# Patient Record
Sex: Male | Born: 1940 | Race: Black or African American | Hispanic: No | Marital: Married | Smoking: Former smoker
Health system: Southern US, Community
[De-identification: ages and names within clinical notes are randomized; demographics above are authoritative.]

## PROBLEM LIST (undated history)

## (undated) DIAGNOSIS — R413 Other amnesia: Secondary | ICD-10-CM

## (undated) DIAGNOSIS — J302 Other seasonal allergic rhinitis: Secondary | ICD-10-CM

## (undated) DIAGNOSIS — K219 Gastro-esophageal reflux disease without esophagitis: Secondary | ICD-10-CM

## (undated) DIAGNOSIS — M1990 Unspecified osteoarthritis, unspecified site: Secondary | ICD-10-CM

## (undated) DIAGNOSIS — F32A Depression, unspecified: Secondary | ICD-10-CM

## (undated) DIAGNOSIS — Z9289 Personal history of other medical treatment: Secondary | ICD-10-CM

## (undated) DIAGNOSIS — K631 Perforation of intestine (nontraumatic): Secondary | ICD-10-CM

## (undated) DIAGNOSIS — Z87442 Personal history of urinary calculi: Secondary | ICD-10-CM

## (undated) DIAGNOSIS — I639 Cerebral infarction, unspecified: Secondary | ICD-10-CM

## (undated) DIAGNOSIS — E049 Nontoxic goiter, unspecified: Secondary | ICD-10-CM

## (undated) DIAGNOSIS — F419 Anxiety disorder, unspecified: Secondary | ICD-10-CM

## (undated) DIAGNOSIS — E7800 Pure hypercholesterolemia, unspecified: Secondary | ICD-10-CM

## (undated) DIAGNOSIS — F329 Major depressive disorder, single episode, unspecified: Secondary | ICD-10-CM

## (undated) HISTORY — PX: OTHER SURGICAL HISTORY: SHX169

## (undated) HISTORY — PX: CHOLECYSTECTOMY: SHX55

## (undated) HISTORY — PX: CERVICAL DISCECTOMY: SHX98

---

## 1998-05-16 ENCOUNTER — Emergency Department (HOSPITAL_COMMUNITY): Admission: EM | Admit: 1998-05-16 | Discharge: 1998-05-16 | Payer: Self-pay | Admitting: Emergency Medicine

## 1998-05-27 ENCOUNTER — Emergency Department (HOSPITAL_COMMUNITY): Admission: EM | Admit: 1998-05-27 | Discharge: 1998-05-27 | Payer: Self-pay | Admitting: Endocrinology

## 1998-06-02 ENCOUNTER — Encounter: Admission: RE | Admit: 1998-06-02 | Discharge: 1998-08-31 | Payer: Self-pay | Admitting: Anesthesiology

## 1998-06-17 ENCOUNTER — Emergency Department (HOSPITAL_COMMUNITY): Admission: EM | Admit: 1998-06-17 | Discharge: 1998-06-17 | Payer: Self-pay | Admitting: Emergency Medicine

## 1998-06-17 ENCOUNTER — Ambulatory Visit (HOSPITAL_COMMUNITY): Admission: RE | Admit: 1998-06-17 | Discharge: 1998-06-17 | Payer: Self-pay | Admitting: Neurosurgery

## 1998-07-01 ENCOUNTER — Encounter: Payer: Self-pay | Admitting: Neurosurgery

## 1998-07-05 ENCOUNTER — Inpatient Hospital Stay (HOSPITAL_COMMUNITY): Admission: RE | Admit: 1998-07-05 | Discharge: 1998-07-06 | Payer: Self-pay | Admitting: Neurosurgery

## 1998-08-02 ENCOUNTER — Encounter: Admission: RE | Admit: 1998-08-02 | Discharge: 1998-10-31 | Payer: Self-pay | Admitting: Neurosurgery

## 1998-09-01 ENCOUNTER — Emergency Department (HOSPITAL_COMMUNITY): Admission: EM | Admit: 1998-09-01 | Discharge: 1998-09-01 | Payer: Self-pay | Admitting: Emergency Medicine

## 1998-09-01 ENCOUNTER — Encounter: Payer: Self-pay | Admitting: Emergency Medicine

## 1998-10-13 ENCOUNTER — Encounter: Payer: Self-pay | Admitting: Chiropractic Medicine

## 1998-10-13 ENCOUNTER — Ambulatory Visit (HOSPITAL_COMMUNITY): Admission: RE | Admit: 1998-10-13 | Discharge: 1998-10-13 | Payer: Self-pay | Admitting: Chiropractic Medicine

## 2000-12-20 ENCOUNTER — Encounter: Payer: Self-pay | Admitting: Emergency Medicine

## 2000-12-20 ENCOUNTER — Emergency Department (HOSPITAL_COMMUNITY): Admission: EM | Admit: 2000-12-20 | Discharge: 2000-12-20 | Payer: Self-pay | Admitting: Emergency Medicine

## 2001-09-05 ENCOUNTER — Ambulatory Visit (HOSPITAL_COMMUNITY): Admission: RE | Admit: 2001-09-05 | Discharge: 2001-09-05 | Payer: Self-pay | Admitting: Cardiology

## 2001-09-05 ENCOUNTER — Encounter: Payer: Self-pay | Admitting: Cardiology

## 2003-05-04 ENCOUNTER — Emergency Department (HOSPITAL_COMMUNITY): Admission: EM | Admit: 2003-05-04 | Discharge: 2003-05-04 | Payer: Self-pay | Admitting: Emergency Medicine

## 2006-04-26 ENCOUNTER — Emergency Department (HOSPITAL_COMMUNITY): Admission: EM | Admit: 2006-04-26 | Discharge: 2006-04-26 | Payer: Self-pay | Admitting: Emergency Medicine

## 2007-06-23 ENCOUNTER — Ambulatory Visit (HOSPITAL_COMMUNITY): Admission: RE | Admit: 2007-06-23 | Discharge: 2007-06-23 | Payer: Self-pay | Admitting: Cardiology

## 2007-06-25 ENCOUNTER — Emergency Department (HOSPITAL_COMMUNITY): Admission: EM | Admit: 2007-06-25 | Discharge: 2007-06-25 | Payer: Self-pay | Admitting: Emergency Medicine

## 2010-05-09 ENCOUNTER — Encounter (HOSPITAL_COMMUNITY): Admission: RE | Admit: 2010-05-09 | Discharge: 2010-07-19 | Payer: Self-pay | Admitting: Cardiology

## 2010-11-03 ENCOUNTER — Encounter
Admission: RE | Admit: 2010-11-03 | Discharge: 2010-11-03 | Payer: Self-pay | Source: Home / Self Care | Attending: Family Medicine | Admitting: Family Medicine

## 2010-11-07 ENCOUNTER — Encounter
Admission: RE | Admit: 2010-11-07 | Discharge: 2010-11-07 | Payer: Self-pay | Source: Home / Self Care | Attending: Family Medicine | Admitting: Family Medicine

## 2010-11-09 ENCOUNTER — Inpatient Hospital Stay (HOSPITAL_COMMUNITY): Admission: AD | Admit: 2010-11-09 | Discharge: 2010-11-13 | Payer: Self-pay | Source: Home / Self Care

## 2010-11-10 ENCOUNTER — Encounter (INDEPENDENT_AMBULATORY_CARE_PROVIDER_SITE_OTHER): Payer: Self-pay

## 2010-11-21 ENCOUNTER — Encounter
Admission: RE | Admit: 2010-11-21 | Discharge: 2010-11-21 | Payer: Self-pay | Source: Home / Self Care | Attending: Family Medicine | Admitting: Family Medicine

## 2010-11-21 ENCOUNTER — Other Ambulatory Visit
Admission: RE | Admit: 2010-11-21 | Discharge: 2010-11-21 | Payer: Self-pay | Source: Home / Self Care | Admitting: Interventional Radiology

## 2010-12-11 NOTE — Discharge Summary (Signed)
  [name], Randy Merritt              ACCOUNT NO.:  [id number]  MEDICAL RECORD NO.:  [id number]          PATIENT TYPE:  INP  LOCATION:  5528                         FACILITY:  [facility]  PHYSICIAN:  Randy Merritt, M.D.DATE OF BIRTH:  Jun 24, 1941  DATE OF ADMISSION:  11/09/2010 DATE OF DISCHARGE:  11/13/2010                              DISCHARGE SUMMARY   ADMISSION DIAGNOSIS:  Cholecystitis.  DISCHARGE DIAGNOSES: 1. Cholecystitis. 2. Thyroid nodule 5 x 4 x 4 left and 1.9 x 1.7 x 3.2 right. 3. Postoperative urinary retention. 4. Dyslipidemia. 5. Gastroesophageal reflux disease. 6. Erectile dysfunction. 7. History of anxiety and depression.  PROCEDURES: 1. Laparoscopic cholecystectomy. 2. Intraoperative arteriogram on November 10, 2010.  BRIEF HISTORY:  The patient is a 70 year old male admitted with right upper quadrant pain.  It became more frequent over the past several weeks, had no change with eating.  He was seen by Dr. Azucena Kuba  at [facility] on November 07, 2010.  LFTs were normal; however, he was sent for an abdominal ultrasound, which showed multiple gallstones with positive Murphy sign.  There was no sign of pericholecystic fluid.  He is referred to our Urgent Clinic and continues to have significant pain. He was seen on November 09, 2010 and admitted to the hospital at that time.  PAST MEDICAL HISTORY: 1. Dyslipidemia. 2. GERD. 3. Erectile dysfunction. 4. Depression and anxiety.  PAST SURGERIES:  Neck surgery.  ALLERGIES:  PREDNISONE.  For further history and physical, please see the dictated note.  HOSPITAL COURSE:  The patient was admitted, placed on antibiotics, and taken to the operating room on November 10, 2010.  He tolerated the procedure well, returned to the floor in stable condition. Postoperatively, he developed some urinary retention, although they had difficultly, a Foley was placed.  He was placed on Flomax.  He made slow steady  progress, urinary retention, based on bladder scan improved.  His abdomen was soft and tender.  He had good bowel sounds.  His Foley was removed and he was found to be ready for discharge on the afternoon of November 13, 2010.  He was discharged home on: 1. Flomax 0.4 mg daily. 2. He was continued on chlordiazepoxide 25 mg t.i.d. p.r.n. 3. Finasteride 5 mg daily. 4. Flexeril 10 mg at bedtime. 5. Prozac 1 daily. 6. Hydromet syrup 1 teaspoon q.6 h. 7. Naprosyn 500 mg b.i.d. 8. Paxil 120 mg 1 daily. 9. Percocet p.r.n. for pain. 10.Protonix 40 mg daily. 11.Tramadol p.r.n. for pain.  Scheduled to return for followup in 2 weeks.  DISCHARGE ACTIVITY:  Light to moderate as tolerated.  He was to remove his dressings, start showering in 24 hours.  Remove Steri-Strips in about 1 week.  Call for any problems.     Randy Merritt, P.A.   ______________________________ Randy Merritt, M.D.    WDJ/MEDQ  D:  12/01/2010  T:  12/02/2010  Job:  [id number]  cc:   Dr. Azucena Kuba  Electronically Signed by Randy Merritt P.A. on 12/05/2010 01:09:20 PM Electronically Signed by Randy Merritt M.D. on 12/11/2010 02:02:15 PM

## 2011-01-22 LAB — COMPREHENSIVE METABOLIC PANEL
ALT: 28 U/L (ref 0–53)
AST: 31 U/L (ref 0–37)
Albumin: 4.3 g/dL (ref 3.5–5.2)
Alkaline Phosphatase: 83 U/L (ref 39–117)
BUN: 13 mg/dL (ref 6–23)
CO2: 29 mEq/L (ref 19–32)
Calcium: 9.9 mg/dL (ref 8.4–10.5)
Chloride: 103 mEq/L (ref 96–112)
Creatinine, Ser: 1.28 mg/dL (ref 0.4–1.5)
GFR calc Af Amer: >60 mL/min (ref >60)
GFR calc non Af Amer: 56 mL/min — ABNORMAL LOW (ref >60)
Glucose, Bld: 84 mg/dL (ref 70–99)
Potassium: 4.1 mEq/L (ref 3.5–5.1)
Sodium: 140 mEq/L (ref 135–145)
Total Bilirubin: 0.6 mg/dL (ref 0.3–1.2)
Total Protein: 7.2 g/dL (ref 6.0–8.3)

## 2011-01-22 LAB — CBC
HCT: 39.1 % (ref 39.0–52.0)
Hemoglobin: 13.4 g/dL (ref 13.0–17.0)
MCH: 32 pg (ref 26.0–34.0)
MCHC: 34.3 g/dL (ref 30.0–36.0)
MCV: 93.3 fL (ref 78.0–100.0)
Platelets: 173 10*3/uL (ref 150–400)
RBC: 4.19 MIL/uL — ABNORMAL LOW (ref 4.22–5.81)
RDW: 12.8 % (ref 11.5–15.5)
WBC: 3.9 10*3/uL — ABNORMAL LOW (ref 4.0–10.5)

## 2012-03-10 ENCOUNTER — Encounter (HOSPITAL_COMMUNITY): Payer: Self-pay | Admitting: Emergency Medicine

## 2012-03-10 ENCOUNTER — Emergency Department (HOSPITAL_COMMUNITY)
Admission: EM | Admit: 2012-03-10 | Discharge: 2012-03-10 | Disposition: A | Discharge status: Home / Self Care | Payer: 59 | Attending: Emergency Medicine | Admitting: Emergency Medicine

## 2012-03-10 ENCOUNTER — Emergency Department (HOSPITAL_COMMUNITY): Payer: 59

## 2012-03-10 DIAGNOSIS — M19019 Primary osteoarthritis, unspecified shoulder: Secondary | ICD-10-CM | POA: Insufficient documentation

## 2012-03-10 DIAGNOSIS — M25519 Pain in unspecified shoulder: Secondary | ICD-10-CM | POA: Insufficient documentation

## 2012-03-10 DIAGNOSIS — X500XXA Overexertion from strenuous movement or load, initial encounter: Secondary | ICD-10-CM | POA: Insufficient documentation

## 2012-03-10 MED ORDER — KETOROLAC TROMETHAMINE 10 MG PO TABS: 10.0000 mg | ORAL_TABLET | Freq: Four times a day (QID) | ORAL | Status: AC | PRN

## 2012-03-10 MED ORDER — KETOROLAC TROMETHAMINE 30 MG/ML IJ SOLN
30.0000 mg | Freq: Once | INTRAMUSCULAR | Status: AC
  Administered 2012-03-10: 30 mg via INTRAMUSCULAR
  Filled 2012-03-10: qty 1

## 2012-03-10 NOTE — Discharge Instructions (Signed)
X-ray shows that you have degenerative disease in your shoulder, joint.  You've been given a medication called Toradol, which is a potent anti-inflammatory.  It is the same medicine that you received by injection.  Please make an appointment with Dr. Luiz Blare for further evaluation

## 2012-03-10 NOTE — ED Provider Notes (Signed)
History     CSN: [id number]  Arrival date & time 03/10/12  1928   First MD Initiated Contact with Patient 03/10/12 2019      Chief Complaint  Patient presents with  . Shoulder Pain    (Consider location/radiation/quality/duration/timing/severity/associated sxs/prior treatment) HPI Comments: Patient states he was helping a family member move lifting dressers so has exotropia and experienced right shoulder pain.  Last week.  He took one Advil without relief.  He tried a topical pain medication without relief.  Tonight he took one Advil without relief  Patient is a 71 y.o. male presenting with shoulder pain. The history is provided by the patient.  Shoulder Pain The current episode started 1 to 4 weeks ago. The problem occurs constantly. The problem has been unchanged. Associated symptoms include arthralgias. Pertinent negatives include no chest pain, chills, fever, headaches, joint swelling or neck pain.    No past medical history on file.  No past surgical history on file.  No family history on file.  History  Substance Use Topics  . Smoking status: Not on file  . Smokeless tobacco: Not on file  . Alcohol Use: Not on file      Review of Systems  Constitutional: Negative for fever and chills.  HENT: Negative for neck pain and neck stiffness.   Respiratory: Negative for shortness of breath.   Cardiovascular: Negative for chest pain.  Musculoskeletal: Positive for arthralgias. Negative for back pain and joint swelling.  Skin: Negative for wound.  Neurological: Negative for dizziness and headaches.    Allergies  Review of patient's allergies indicates no known allergies.  Home Medications   Current Outpatient Rx  Name Route Sig Dispense Refill  . CHLORDIAZEPOXIDE HCL 25 MG PO CAPS Oral Take 25 mg by mouth as needed. For anxiety.    Marland Kitchen FINASTERIDE 5 MG PO TABS Oral Take 5 mg by mouth daily.    . IBUPROFEN 200 MG PO TABS Oral Take 200-400 mg by mouth every 6 (six)  hours as needed. For pain    . LEVOCETIRIZINE DIHYDROCHLORIDE 5 MG PO TABS Oral Take 5 mg by mouth every evening.    Marland Kitchen NYSTATIN 100000 UNIT/ML MT SUSP Swish & Swallow Swish and swallow 500,000 Units 4 (four) times daily.    Marland Kitchen PANTOPRAZOLE SODIUM 40 MG PO TBEC Oral Take 40 mg by mouth every morning.    Marland Kitchen PAROXETINE HCL 20 MG PO TABS Oral Take 20 mg by mouth every morning.    Marland Kitchen ROSUVASTATIN CALCIUM 20 MG PO TABS Oral Take 10 mg by mouth at bedtime.    Marland Kitchen KETOROLAC TROMETHAMINE 10 MG PO TABS Oral Take 1 tablet (10 mg total) by mouth every 6 (six) hours as needed for pain. 20 tablet 0    BP 146/85  Pulse 64  Temp(Src) 98.1 F (36.7 C) (Oral)  Resp 18  SpO2 100%  Physical Exam  Constitutional: He is oriented to person, place, and time. He appears well-developed and well-nourished.  HENT:  Head: Normocephalic.  Eyes: Pupils are equal, round, and reactive to light.  Neck: Normal range of motion.  Cardiovascular: Normal rate.   Pulmonary/Chest: Effort normal.  Musculoskeletal:       Right shoulder: He exhibits decreased range of motion, tenderness and pain. He exhibits no bony tenderness, no swelling, no effusion, no crepitus, no deformity, no laceration, no spasm, normal pulse and normal strength.       Arms: Neurological: He is alert and oriented to person, place, and time.  Skin: Skin is warm and dry. No rash noted.    ED Course  Procedures (including critical care time)  Labs Reviewed - No data to display Dg Shoulder Right  03/10/2012  *RADIOLOGY REPORT*  Clinical Data: Right shoulder pain  RIGHT SHOULDER - 2+ VIEW  Comparison: None  Findings: Right acromioclavicular joint degenerative change.  No acute fracture or dislocation.   Right upper lung is clear. Multilevel degenerative changes of the upper thoracic spine.  IMPRESSION: Right acromioclavicular degenerative changes.  No acute osseous abnormality identified.  Original Report Authenticated By: Waneta Martins, M.D.     1.  Degenerative joint disease of shoulder region, right       MDM  After exam.  This appears to be muscle strain        Arman Filter, NP 03/10/12 2227

## 2012-03-10 NOTE — ED Notes (Signed)
Pt reports right shoulder pain for 1 week after helping son move; pt reports soreness upon movement. Been taking OTC meds but no relief

## 2012-03-10 NOTE — ED Provider Notes (Signed)
Medical screening examination/treatment/procedure(s) were performed by non-physician practitioner and as supervising physician I was immediately available for consultation/collaboration.  Ethelda Chick, MD 03/10/12 2234

## 2012-12-27 ENCOUNTER — Emergency Department (HOSPITAL_COMMUNITY)
Admission: EM | Admit: 2012-12-27 | Discharge: 2012-12-27 | Disposition: A | Discharge status: Home / Self Care | Payer: 59 | Attending: Emergency Medicine | Admitting: Emergency Medicine

## 2012-12-27 ENCOUNTER — Encounter (HOSPITAL_COMMUNITY): Payer: Self-pay | Admitting: *Deleted

## 2012-12-27 ENCOUNTER — Emergency Department (HOSPITAL_COMMUNITY): Payer: 59

## 2012-12-27 DIAGNOSIS — Y939 Activity, unspecified: Secondary | ICD-10-CM | POA: Insufficient documentation

## 2012-12-27 DIAGNOSIS — S298XXA Other specified injuries of thorax, initial encounter: Secondary | ICD-10-CM | POA: Insufficient documentation

## 2012-12-27 DIAGNOSIS — E7800 Pure hypercholesterolemia, unspecified: Secondary | ICD-10-CM | POA: Insufficient documentation

## 2012-12-27 DIAGNOSIS — IMO0002 Reserved for concepts with insufficient information to code with codable children: Secondary | ICD-10-CM | POA: Insufficient documentation

## 2012-12-27 DIAGNOSIS — Z87891 Personal history of nicotine dependence: Secondary | ICD-10-CM | POA: Insufficient documentation

## 2012-12-27 DIAGNOSIS — K219 Gastro-esophageal reflux disease without esophagitis: Secondary | ICD-10-CM | POA: Insufficient documentation

## 2012-12-27 DIAGNOSIS — Y929 Unspecified place or not applicable: Secondary | ICD-10-CM | POA: Insufficient documentation

## 2012-12-27 DIAGNOSIS — T07XXXA Unspecified multiple injuries, initial encounter: Secondary | ICD-10-CM

## 2012-12-27 DIAGNOSIS — T148XXA Other injury of unspecified body region, initial encounter: Secondary | ICD-10-CM

## 2012-12-27 DIAGNOSIS — R296 Repeated falls: Secondary | ICD-10-CM | POA: Insufficient documentation

## 2012-12-27 DIAGNOSIS — Z79899 Other long term (current) drug therapy: Secondary | ICD-10-CM | POA: Insufficient documentation

## 2012-12-27 HISTORY — DX: Pure hypercholesterolemia, unspecified: E78.00

## 2012-12-27 HISTORY — DX: Gastro-esophageal reflux disease without esophagitis: K21.9

## 2012-12-27 MED ORDER — NAPROXEN 375 MG PO TABS: 375.0000 mg | ORAL_TABLET | Freq: Two times a day (BID) | ORAL | Status: DC | PRN

## 2012-12-27 MED ORDER — DIAZEPAM 5 MG PO TABS: 5.0000 mg | ORAL_TABLET | Freq: Three times a day (TID) | ORAL | Status: DC | PRN

## 2012-12-27 MED ORDER — OXYCODONE-ACETAMINOPHEN 5-325 MG PO TABS: 1.0000 | ORAL_TABLET | ORAL | Status: DC | PRN

## 2012-12-27 MED ORDER — OXYCODONE-ACETAMINOPHEN 5-325 MG PO TABS
1.0000 | ORAL_TABLET | Freq: Once | ORAL | Status: AC
  Administered 2012-12-27: 1 via ORAL
  Filled 2012-12-27: qty 1

## 2012-12-27 NOTE — ED Notes (Signed)
Pt states he fell Wednesday or Thursday, states he hit his R side, complaining of R arm and R rib/side pain.

## 2012-12-31 NOTE — ED Provider Notes (Signed)
History    72yM with R sided CP and arm pain. Onset after a fall Thursday. Mechanical. Lost balance. Persistent pain since. NO SOB. Doesn't think hit head. No HA, neck or back pain. No numbness, tingling or loss of strength.   CSN: [id number]  Arrival date & time 12/27/12  1235   First MD Initiated Contact with Patient 12/27/12 1505      Chief Complaint  Patient presents with  . Fall  . Arm Pain  . Flank Pain    (Consider location/radiation/quality/duration/timing/severity/associated sxs/prior treatment) HPI  Past Medical History  Diagnosis Date  . High cholesterol   . Acid reflux     Past Surgical History  Procedure Laterality Date  . Gallstone removal      History reviewed. No pertinent family history.  History  Substance Use Topics  . Smoking status: Former Games developer  . Smokeless tobacco: Never Used  . Alcohol Use: No      Review of Systems  All systems reviewed and negative, other than as noted in HPI.   Allergies  Review of patient's allergies indicates no known allergies.  Home Medications   Current Outpatient Rx  Name  Route  Sig  Dispense  Refill  . chlordiazePOXIDE (LIBRIUM) 25 MG capsule   Oral   Take 25 mg by mouth as needed. For anxiety.         . diazepam (VALIUM) 5 MG tablet   Oral   Take 1 tablet (5 mg total) by mouth every 8 (eight) hours as needed (muscle spasm).   10 tablet   0   . finasteride (PROSCAR) 5 MG tablet   Oral   Take 5 mg by mouth daily.         Marland Kitchen ibuprofen (ADVIL,MOTRIN) 200 MG tablet   Oral   Take 200-400 mg by mouth every 6 (six) hours as needed. For pain         . levocetirizine (XYZAL) 5 MG tablet   Oral   Take 5 mg by mouth every evening.         . naproxen (NAPROSYN) 375 MG tablet   Oral   Take 1 tablet (375 mg total) by mouth 2 (two) times daily as needed.   20 tablet   0   . nystatin (MYCOSTATIN) 100000 UNIT/ML suspension   Swish & Swallow   Swish and swallow 500,000 Units 4 (four) times  daily.         Marland Kitchen oxyCODONE-acetaminophen (PERCOCET/ROXICET) 5-325 MG per tablet   Oral   Take 1 tablet by mouth every 4 (four) hours as needed for pain.   10 tablet   0   . pantoprazole (PROTONIX) 40 MG tablet   Oral   Take 40 mg by mouth every morning.         Marland Kitchen PARoxetine (PAXIL) 20 MG tablet   Oral   Take 20 mg by mouth every morning.         . rosuvastatin (CRESTOR) 20 MG tablet   Oral   Take 10 mg by mouth at bedtime.           BP 148/88  Pulse 72  Temp(Src) 97.5 F (36.4 C) (Oral)  Resp 18  SpO2 97%  Physical Exam  Nursing note and vitals reviewed. Constitutional: He appears well-developed and well-nourished. No distress.  HENT:  Head: Normocephalic and atraumatic.  Eyes: Conjunctivae are normal. Right eye exhibits no discharge. Left eye exhibits no discharge.  Neck: Neck supple.  Cardiovascular: Normal  rate, regular rhythm and normal heart sounds.  Exam reveals no gallop and no friction rub.   No murmur heard. Pulmonary/Chest: Effort normal and breath sounds normal. No respiratory distress. He exhibits tenderness.  Tenderness R chest wall in axillary region. No overlying skin changes. No crepitus.  Abdominal: Soft. He exhibits no distension. There is no tenderness.  Musculoskeletal: He exhibits no edema and no tenderness.  Moderate pain with ROM of R shoulder. NVI distally.   Neurological: He is alert.  Skin: Skin is warm and dry.  Psychiatric: He has a normal mood and affect. His behavior is normal. Thought content normal.    ED Course  Procedures (including critical care time)  Labs Reviewed - No data to display No results found.   1. Muscle strain   2. Multiple contusions       MDM  72yM with R sided chest/shoulder pain after fall. Fairly unimpressive physical exam. Imaging reassuring. Plan symptomatic tx.         Raeford Razor, MD 12/31/12 1019

## 2013-01-26 ENCOUNTER — Emergency Department (HOSPITAL_COMMUNITY): Payer: 59

## 2013-01-26 ENCOUNTER — Encounter (HOSPITAL_COMMUNITY): Payer: Self-pay | Admitting: *Deleted

## 2013-01-26 ENCOUNTER — Emergency Department (HOSPITAL_COMMUNITY)
Admission: EM | Admit: 2013-01-26 | Discharge: 2013-01-26 | Disposition: A | Discharge status: Home Health | Payer: 59 | Attending: Emergency Medicine | Admitting: Emergency Medicine

## 2013-01-26 DIAGNOSIS — M25512 Pain in left shoulder: Secondary | ICD-10-CM

## 2013-01-26 DIAGNOSIS — Z87891 Personal history of nicotine dependence: Secondary | ICD-10-CM | POA: Insufficient documentation

## 2013-01-26 DIAGNOSIS — Z79899 Other long term (current) drug therapy: Secondary | ICD-10-CM | POA: Insufficient documentation

## 2013-01-26 DIAGNOSIS — M25519 Pain in unspecified shoulder: Secondary | ICD-10-CM | POA: Insufficient documentation

## 2013-01-26 DIAGNOSIS — E7800 Pure hypercholesterolemia, unspecified: Secondary | ICD-10-CM | POA: Insufficient documentation

## 2013-01-26 DIAGNOSIS — K219 Gastro-esophageal reflux disease without esophagitis: Secondary | ICD-10-CM | POA: Insufficient documentation

## 2013-01-26 MED ORDER — DIAZEPAM 5 MG PO TABS: 5.0000 mg | ORAL_TABLET | Freq: Three times a day (TID) | ORAL | Status: DC | PRN

## 2013-01-26 MED ORDER — HYDROCODONE-ACETAMINOPHEN 5-325 MG PO TABS: 1.0000 | ORAL_TABLET | Freq: Four times a day (QID) | ORAL | Status: DC | PRN

## 2013-01-26 MED ORDER — OXYCODONE-ACETAMINOPHEN 5-325 MG PO TABS
1.0000 | ORAL_TABLET | Freq: Once | ORAL | Status: AC
  Administered 2013-01-26: 1 via ORAL
  Filled 2013-01-26: qty 1

## 2013-01-26 NOTE — ED Provider Notes (Signed)
History     CSN: [id number]  Arrival date & time 01/26/13  4098   First MD Initiated Contact with Patient 01/26/13 0815      Chief Complaint  Patient presents with  . Arm Pain    left    (Consider location/radiation/quality/duration/timing/severity/associated sxs/prior treatment) HPI Randy Merritt is a 72 y.o. male who presents to ED with complaint of pain to the left shoulder and left arm. Pt states he has had two falls in the last two weeks. States fell two weeks ago after slipping on ice, injuring right shoulder at that time, states was seen in ED, since then that arm feels better. Not sure if injured left as well. States second time fell a week ago, after getting up in the dark and tripping over something. States not sure if injured arm at that time, but has had persistent and worsening pain in left shoulder radiating down to the left hand. Denies numbness of weakness in the hand. States hand feels "swollen" however, there is not visual swelling. Pt states pain worsened with movement of the left shoulder. Unable to left arm on his own. Denies neck pain or injuries, but states hx of surgery. Denies head injury. Taking advil with no relief.    Past Medical History  Diagnosis Date  . High cholesterol   . Acid reflux     Past Surgical History  Procedure Laterality Date  . Gallstone removal      No family history on file.  History  Substance Use Topics  . Smoking status: Former Games developer  . Smokeless tobacco: Never Used  . Alcohol Use: No      Review of Systems  HENT: Negative for neck pain and neck stiffness.   Respiratory: Negative for shortness of breath.   Cardiovascular: Negative for chest pain.  Musculoskeletal: Positive for myalgias and arthralgias. Negative for back pain.  Neurological: Negative for weakness, numbness and headaches.    Allergies  Review of patient's allergies indicates no known allergies.  Home Medications   Current Outpatient Rx  Name   Route  Sig  Dispense  Refill  . chlordiazePOXIDE (LIBRIUM) 25 MG capsule   Oral   Take 25 mg by mouth as needed. For anxiety.         . diazepam (VALIUM) 5 MG tablet   Oral   Take 1 tablet (5 mg total) by mouth every 8 (eight) hours as needed (muscle spasm).   10 tablet   0   . finasteride (PROSCAR) 5 MG tablet   Oral   Take 5 mg by mouth daily.         Marland Kitchen levocetirizine (XYZAL) 5 MG tablet   Oral   Take 5 mg by mouth every evening.         . Multiple Vitamin (MULTIVITAMIN WITH MINERALS) TABS   Oral   Take 1 tablet by mouth daily.         . pantoprazole (PROTONIX) 40 MG tablet   Oral   Take 40 mg by mouth every morning.         Marland Kitchen PARoxetine (PAXIL) 20 MG tablet   Oral   Take 20 mg by mouth every morning.         . rosuvastatin (CRESTOR) 20 MG tablet   Oral   Take 10 mg by mouth at bedtime.           BP 147/79  Temp(Src) 97.6 F (36.4 C) (Oral)  Resp 18  SpO2 99%  Physical Exam  Nursing note and vitals reviewed. Constitutional: He appears well-developed and well-nourished. No distress.  Neck: Normal range of motion.  No midline cervical spine tenderness. Full rom of the neck  Cardiovascular: Normal rate, regular rhythm and normal heart sounds.   Pulmonary/Chest: Effort normal and breath sounds normal. No respiratory distress. He has no wheezes. He has no rales.  Musculoskeletal:  Diffuse tenderness over paravertebral cervical spine and left trapezius and shoulder joint. Full rom passively with minimal pain of the left shoulder. Pain with active ROM greater than 45%. Positive arm drop test. Bicep and triceps strength intact. Grip strength 5/5 and equal bilaterally. Normal distal radial pulses  Neurological:  5/5 and equal bilateral grip strength. Normal sensation over all dermatomes of left hand and arm  Skin: Skin is warm and dry.    ED Course  Procedures (including critical care time)   Dg Cervical Spine Complete  01/26/2013  *RADIOLOGY  REPORT*  Clinical Data: Left neck pain, possible fall  CERVICAL SPINE - COMPLETE 4+ VIEW  Comparison: 06/25/2007  Findings: Cervical spine is visualized to C7-T1 on the lateral view.  Straightening of the cervical spine.  Stable osseous fusion at C3- 4.  No evidence of fracture or dislocation.  Stable mild loss of height of the C5-7 vertebral bodies. The dens appears intact.  The lateral masses of C1 are symmetric.  No prevertebral soft tissue swelling.  Moderate to severe multilevel degenerative changes.  Visualized lung apices are clear.  IMPRESSION: No evidence of fracture or dislocation.  Stable osseous fusion at C3-4.  Moderate to severe multilevel degenerative changes.   Original Report Authenticated By: Charline Bills, M.D.    Dg Shoulder Left  01/26/2013  *RADIOLOGY REPORT*  Clinical Data: Injury, pain.  LEFT SHOULDER - 2+ VIEW  Comparison: None.  Findings: There is no acute bony or joint abnormality.  Bulky acromioclavicular degenerative change is identified.  Imaged left lung and ribs are unremarkable.  IMPRESSION: No acute finding.  Bulky acromioclavicular osteoarthritis.   Original Report Authenticated By: Holley Dexter, M.D.       1. Left shoulder pain       MDM  Pt with left shoulder and arm pain. Reproducible with active movement of the shoulder joint. Neurovascularly intact, normal distal pulses, normal sensation and grip strenght. X-rays showing arthritic changes. Suspect possible rotator cuff injury. Will try  Sling, pain medications, follow up with orthopedics.           Lottie Mussel, PA-C 01/26/13 1605

## 2013-01-26 NOTE — ED Notes (Signed)
Patient states he has fallen over the past couple of weeks due to weather related things and fallen on his left shoulder and arm, patient with spinal surgery approx 15 years ago and now states arm feels as it did prior to that surgery

## 2013-01-26 NOTE — Progress Notes (Signed)
Orthopedic Tech Progress Note Patient Details:  Lissandro Dilorenzo Oct 24, 1941 [id number]  Ortho Devices Type of Ortho Device: Arm sling Ortho Device/Splint Interventions: Application   Cammer, Mickie Bail 01/26/2013, 9:54 AM

## 2013-01-27 NOTE — ED Provider Notes (Signed)
Medical screening examination/treatment/procedure(s) were conducted as a shared visit with non-physician practitioner(s) and myself.  I personally evaluated the patient during the encounter  Elderly patient with mechanical fall, xrays neg. Ortho followup.   Shanta Hartner B. Bernette Mayers, MD 01/27/13 [phone]

## 2013-03-20 ENCOUNTER — Other Ambulatory Visit: Payer: Self-pay | Admitting: Neurosurgery

## 2013-03-20 DIAGNOSIS — M542 Cervicalgia: Secondary | ICD-10-CM

## 2013-03-20 DIAGNOSIS — M5410 Radiculopathy, site unspecified: Secondary | ICD-10-CM

## 2013-03-26 ENCOUNTER — Ambulatory Visit
Admission: RE | Admit: 2013-03-26 | Discharge: 2013-03-26 | Disposition: A | Discharge status: Home / Self Care | Payer: 59 | Source: Ambulatory Visit | Attending: Neurosurgery | Admitting: Neurosurgery

## 2013-03-26 VITALS — BP 147/90 | HR 62 | Wt 165.0 lb

## 2013-03-26 DIAGNOSIS — M5410 Radiculopathy, site unspecified: Secondary | ICD-10-CM

## 2013-03-26 DIAGNOSIS — M542 Cervicalgia: Secondary | ICD-10-CM

## 2013-03-26 MED ORDER — IOHEXOL 300 MG/ML  SOLN
10.0000 mL | Freq: Once | INTRAMUSCULAR | Status: AC | PRN
  Administered 2013-03-26: 10 mL via INTRATHECAL

## 2013-03-26 MED ORDER — MEPERIDINE HCL 100 MG/ML IJ SOLN
75.0000 mg | Freq: Once | INTRAMUSCULAR | Status: AC
  Administered 2013-03-26: 75 mg via INTRAMUSCULAR

## 2013-03-26 MED ORDER — ONDANSETRON HCL 4 MG/2ML IJ SOLN
4.0000 mg | Freq: Once | INTRAMUSCULAR | Status: AC
  Administered 2013-03-26: 4 mg via INTRAMUSCULAR

## 2013-03-26 MED ORDER — DIAZEPAM 5 MG PO TABS
5.0000 mg | ORAL_TABLET | Freq: Once | ORAL | Status: AC
  Administered 2013-03-26: 5 mg via ORAL

## 2013-03-26 NOTE — Progress Notes (Signed)
Patient states he has been off Paxil/Paroxetine for at least the past two days.  Discharge instructions explained to the patient and his wife.  jkl

## 2013-04-03 ENCOUNTER — Encounter (HOSPITAL_COMMUNITY): Payer: Self-pay | Admitting: Pharmacy Technician

## 2013-04-03 ENCOUNTER — Other Ambulatory Visit: Payer: Self-pay | Admitting: Neurosurgery

## 2013-04-07 ENCOUNTER — Encounter (HOSPITAL_COMMUNITY): Payer: Self-pay | Admitting: *Deleted

## 2013-04-08 ENCOUNTER — Encounter (HOSPITAL_COMMUNITY): Payer: Self-pay | Admitting: *Deleted

## 2013-04-08 ENCOUNTER — Ambulatory Visit (HOSPITAL_COMMUNITY): Payer: 59 | Admitting: *Deleted

## 2013-04-08 ENCOUNTER — Inpatient Hospital Stay (HOSPITAL_COMMUNITY)
Admission: RE | Admit: 2013-04-08 | Discharge: 2013-04-10 | DRG: 473 | Disposition: A | Discharge status: Home / Self Care | Payer: 59 | Source: Ambulatory Visit | Attending: Neurosurgery | Admitting: Neurosurgery

## 2013-04-08 ENCOUNTER — Encounter (HOSPITAL_COMMUNITY)
Admission: RE | Disposition: A | Discharge status: Home / Self Care | Payer: Self-pay | Source: Ambulatory Visit | Attending: Neurosurgery

## 2013-04-08 ENCOUNTER — Ambulatory Visit (HOSPITAL_COMMUNITY): Payer: 59

## 2013-04-08 DIAGNOSIS — M4802 Spinal stenosis, cervical region: Principal | ICD-10-CM | POA: Diagnosis present

## 2013-04-08 DIAGNOSIS — F329 Major depressive disorder, single episode, unspecified: Secondary | ICD-10-CM | POA: Diagnosis present

## 2013-04-08 DIAGNOSIS — Z87891 Personal history of nicotine dependence: Secondary | ICD-10-CM

## 2013-04-08 DIAGNOSIS — E7800 Pure hypercholesterolemia, unspecified: Secondary | ICD-10-CM | POA: Diagnosis present

## 2013-04-08 DIAGNOSIS — K219 Gastro-esophageal reflux disease without esophagitis: Secondary | ICD-10-CM | POA: Diagnosis present

## 2013-04-08 DIAGNOSIS — F411 Generalized anxiety disorder: Secondary | ICD-10-CM | POA: Diagnosis present

## 2013-04-08 DIAGNOSIS — F3289 Other specified depressive episodes: Secondary | ICD-10-CM | POA: Diagnosis present

## 2013-04-08 HISTORY — DX: Depression, unspecified: F32.A

## 2013-04-08 HISTORY — DX: Personal history of other medical treatment: Z92.89

## 2013-04-08 HISTORY — DX: Unspecified osteoarthritis, unspecified site: M19.90

## 2013-04-08 HISTORY — PX: ANTERIOR CERVICAL DECOMP/DISCECTOMY FUSION: SHX1161

## 2013-04-08 HISTORY — DX: Major depressive disorder, single episode, unspecified: F32.9

## 2013-04-08 HISTORY — DX: Other seasonal allergic rhinitis: J30.2

## 2013-04-08 HISTORY — DX: Anxiety disorder, unspecified: F41.9

## 2013-04-08 LAB — CBC
HCT: 41 % (ref 39.0–52.0)
Hemoglobin: 14.3 g/dL (ref 13.0–17.0)
MCH: 32.1 pg (ref 26.0–34.0)
MCHC: 34.9 g/dL (ref 30.0–36.0)
MCV: 92.1 fL (ref 78.0–100.0)
Platelets: 157 10*3/uL (ref 150–400)
RBC: 4.45 MIL/uL (ref 4.22–5.81)
RDW: 12.8 % (ref 11.5–15.5)
WBC: 3.7 10*3/uL — ABNORMAL LOW (ref 4.0–10.5)

## 2013-04-08 LAB — SURGICAL PCR SCREEN
MRSA, PCR: NEGATIVE
Staphylococcus aureus: NEGATIVE

## 2013-04-08 SURGERY — ANTERIOR CERVICAL DECOMPRESSION/DISCECTOMY FUSION 1 LEVEL
Anesthesia: General | Site: Neck | Wound class: Clean

## 2013-04-08 MED ORDER — ONDANSETRON HCL 4 MG/2ML IJ SOLN: 4.0000 mg | INTRAMUSCULAR | Status: DC | PRN

## 2013-04-08 MED ORDER — ACETAMINOPHEN 325 MG PO TABS: 650.0000 mg | ORAL_TABLET | ORAL | Status: DC | PRN

## 2013-04-08 MED ORDER — MORPHINE SULFATE 2 MG/ML IJ SOLN
1.0000 mg | INTRAMUSCULAR | Status: DC | PRN
  Administered 2013-04-08 – 2013-04-09 (×4): 2 mg via INTRAVENOUS
  Filled 2013-04-08 (×4): qty 1

## 2013-04-08 MED ORDER — ONDANSETRON HCL 4 MG/2ML IJ SOLN
INTRAMUSCULAR | Status: DC | PRN
  Administered 2013-04-08: 4 mg via INTRAVENOUS

## 2013-04-08 MED ORDER — FENTANYL CITRATE 0.05 MG/ML IJ SOLN
INTRAMUSCULAR | Status: DC | PRN
  Administered 2013-04-08 (×3): 50 ug via INTRAVENOUS
  Administered 2013-04-08: 100 ug via INTRAVENOUS
  Administered 2013-04-08: 50 ug via INTRAVENOUS

## 2013-04-08 MED ORDER — ATORVASTATIN CALCIUM 10 MG PO TABS: 10.0000 mg | ORAL_TABLET | Freq: Every day | ORAL | Status: DC

## 2013-04-08 MED ORDER — LORATADINE 10 MG PO TABS
10.0000 mg | ORAL_TABLET | Freq: Every evening | ORAL | Status: DC
  Administered 2013-04-09: 10 mg via ORAL
  Filled 2013-04-08 (×3): qty 1

## 2013-04-08 MED ORDER — SODIUM CHLORIDE 0.9 % IV SOLN: 250.0000 mL | INTRAVENOUS | Status: DC

## 2013-04-08 MED ORDER — GLYCOPYRROLATE 0.2 MG/ML IJ SOLN
INTRAMUSCULAR | Status: DC | PRN
  Administered 2013-04-08: 0.6 mg via INTRAVENOUS

## 2013-04-08 MED ORDER — MUPIROCIN 2 % EX OINT
TOPICAL_OINTMENT | CUTANEOUS | Status: AC
  Administered 2013-04-08: 1 via NASAL
  Filled 2013-04-08: qty 22

## 2013-04-08 MED ORDER — ACETAMINOPHEN 650 MG RE SUPP: 650.0000 mg | RECTAL | Status: DC | PRN

## 2013-04-08 MED ORDER — HYDROMORPHONE HCL PF 1 MG/ML IJ SOLN
INTRAMUSCULAR | Status: AC
  Filled 2013-04-08: qty 1

## 2013-04-08 MED ORDER — VECURONIUM BROMIDE 10 MG IV SOLR
INTRAVENOUS | Status: DC | PRN
  Administered 2013-04-08: 2 mg via INTRAVENOUS

## 2013-04-08 MED ORDER — HYDROMORPHONE HCL PF 1 MG/ML IJ SOLN
0.2500 mg | INTRAMUSCULAR | Status: DC | PRN
  Administered 2013-04-08 (×2): 0.5 mg via INTRAVENOUS

## 2013-04-08 MED ORDER — PROPOFOL 10 MG/ML IV BOLUS
INTRAVENOUS | Status: DC | PRN
  Administered 2013-04-08: 160 mg via INTRAVENOUS

## 2013-04-08 MED ORDER — DEXAMETHASONE SODIUM PHOSPHATE 4 MG/ML IJ SOLN
INTRAMUSCULAR | Status: DC | PRN
  Administered 2013-04-08: 8 mg via INTRAVENOUS

## 2013-04-08 MED ORDER — HEMOSTATIC AGENTS (NO CHARGE) OPTIME
TOPICAL | Status: DC | PRN
  Administered 2013-04-08: 1 via TOPICAL

## 2013-04-08 MED ORDER — DEXAMETHASONE SODIUM PHOSPHATE 4 MG/ML IJ SOLN
4.0000 mg | Freq: Four times a day (QID) | INTRAMUSCULAR | Status: DC
  Administered 2013-04-08 – 2013-04-09 (×2): 4 mg via INTRAVENOUS
  Filled 2013-04-08 (×10): qty 1

## 2013-04-08 MED ORDER — SODIUM CHLORIDE 0.9 % IV SOLN
INTRAVENOUS | Status: DC
  Administered 2013-04-08 – 2013-04-09 (×2): via INTRAVENOUS

## 2013-04-08 MED ORDER — FINASTERIDE 5 MG PO TABS: 5.0000 mg | ORAL_TABLET | Freq: Every day | ORAL | Status: DC

## 2013-04-08 MED ORDER — SODIUM CHLORIDE 0.9 % IJ SOLN
3.0000 mL | Freq: Two times a day (BID) | INTRAMUSCULAR | Status: DC
Start: 2013-04-08 — End: 2013-04-10
  Administered 2013-04-08 – 2013-04-10 (×3): 3 mL via INTRAVENOUS

## 2013-04-08 MED ORDER — MUPIROCIN 2 % EX OINT: TOPICAL_OINTMENT | Freq: Two times a day (BID) | CUTANEOUS | Status: DC

## 2013-04-08 MED ORDER — ROCURONIUM BROMIDE 100 MG/10ML IV SOLN
INTRAVENOUS | Status: DC | PRN
  Administered 2013-04-08: 50 mg via INTRAVENOUS

## 2013-04-08 MED ORDER — CEFAZOLIN SODIUM-DEXTROSE 2-3 GM-% IV SOLR
INTRAVENOUS | Status: AC
  Administered 2013-04-08: 2 g via INTRAVENOUS
  Filled 2013-04-08: qty 50

## 2013-04-08 MED ORDER — LIDOCAINE HCL (CARDIAC) 20 MG/ML IV SOLN
INTRAVENOUS | Status: DC | PRN
  Administered 2013-04-08: 70 mg via INTRAVENOUS

## 2013-04-08 MED ORDER — DIAZEPAM 5 MG PO TABS: 5.0000 mg | ORAL_TABLET | Freq: Four times a day (QID) | ORAL | Status: DC | PRN

## 2013-04-08 MED ORDER — PHENOL 1.4 % MT LIQD
1.0000 | OROMUCOSAL | Status: DC | PRN
Start: 2013-04-08 — End: 2013-04-10
  Filled 2013-04-08: qty 177

## 2013-04-08 MED ORDER — ZOLPIDEM TARTRATE 5 MG PO TABS: 5.0000 mg | ORAL_TABLET | Freq: Every evening | ORAL | Status: DC | PRN

## 2013-04-08 MED ORDER — DEXAMETHASONE 4 MG PO TABS
4.0000 mg | ORAL_TABLET | Freq: Four times a day (QID) | ORAL | Status: DC
  Administered 2013-04-09 – 2013-04-10 (×5): 4 mg via ORAL
  Filled 2013-04-08 (×11): qty 1

## 2013-04-08 MED ORDER — MENTHOL 3 MG MT LOZG
1.0000 | LOZENGE | OROMUCOSAL | Status: DC | PRN
  Administered 2013-04-09: 3 mg via ORAL
  Filled 2013-04-08 (×2): qty 9

## 2013-04-08 MED ORDER — STERILE WATER FOR IRRIGATION IR SOLN
Status: DC | PRN
  Administered 2013-04-08: 1000 mL

## 2013-04-08 MED ORDER — OXYCODONE-ACETAMINOPHEN 5-325 MG PO TABS
1.0000 | ORAL_TABLET | ORAL | Status: DC | PRN
  Administered 2013-04-09 – 2013-04-10 (×4): 2 via ORAL
  Filled 2013-04-08 (×4): qty 2

## 2013-04-08 MED ORDER — SODIUM CHLORIDE 0.9 % IJ SOLN: 3.0000 mL | INTRAMUSCULAR | Status: DC | PRN

## 2013-04-08 MED ORDER — CHLORDIAZEPOXIDE HCL 5 MG PO CAPS: 25.0000 mg | ORAL_CAPSULE | Freq: Every day | ORAL | Status: DC | PRN

## 2013-04-08 MED ORDER — THROMBIN 5000 UNITS EX SOLR
CUTANEOUS | Status: DC | PRN
  Administered 2013-04-08 (×2): 5000 [IU] via TOPICAL

## 2013-04-08 MED ORDER — ATORVASTATIN CALCIUM 10 MG PO TABS
10.0000 mg | ORAL_TABLET | Freq: Every day | ORAL | Status: DC
  Administered 2013-04-09: 10 mg via ORAL
  Filled 2013-04-08 (×3): qty 1

## 2013-04-08 MED ORDER — 0.9 % SODIUM CHLORIDE (POUR BTL) OPTIME
TOPICAL | Status: DC | PRN
  Administered 2013-04-08: 1000 mL

## 2013-04-08 MED ORDER — LEVOCETIRIZINE DIHYDROCHLORIDE 5 MG PO TABS: 5.0000 mg | ORAL_TABLET | Freq: Every evening | ORAL | Status: DC

## 2013-04-08 MED ORDER — CEFAZOLIN SODIUM 1-5 GM-% IV SOLN
1.0000 g | Freq: Three times a day (TID) | INTRAVENOUS | Status: AC
  Administered 2013-04-08 – 2013-04-09 (×2): 1 g via INTRAVENOUS
  Filled 2013-04-08 (×2): qty 50

## 2013-04-08 MED ORDER — THROMBIN 5000 UNITS EX SOLR
OROMUCOSAL | Status: DC | PRN
  Administered 2013-04-08 (×2): via TOPICAL

## 2013-04-08 MED ORDER — FINASTERIDE 5 MG PO TABS
5.0000 mg | ORAL_TABLET | Freq: Every day | ORAL | Status: DC
  Administered 2013-04-09 – 2013-04-10 (×2): 5 mg via ORAL
  Filled 2013-04-08 (×3): qty 1

## 2013-04-08 MED ORDER — MIDAZOLAM HCL 5 MG/5ML IJ SOLN
INTRAMUSCULAR | Status: DC | PRN
  Administered 2013-04-08: 2 mg via INTRAVENOUS

## 2013-04-08 MED ORDER — LACTATED RINGERS IV SOLN
INTRAVENOUS | Status: DC | PRN
  Administered 2013-04-08 (×2): via INTRAVENOUS

## 2013-04-08 MED ORDER — NEOSTIGMINE METHYLSULFATE 1 MG/ML IJ SOLN
INTRAMUSCULAR | Status: DC | PRN
  Administered 2013-04-08: 5 mg via INTRAVENOUS

## 2013-04-08 MED ORDER — SENNA 8.6 MG PO TABS
1.0000 | ORAL_TABLET | Freq: Two times a day (BID) | ORAL | Status: DC
  Administered 2013-04-08 – 2013-04-10 (×4): 8.6 mg via ORAL
  Filled 2013-04-08 (×5): qty 1

## 2013-04-08 MED ORDER — LORATADINE 10 MG PO TABS: 10.0000 mg | ORAL_TABLET | Freq: Every evening | ORAL | Status: DC

## 2013-04-08 SURGICAL SUPPLY — 55 items
APL SKNCLS STERI-STRIP NONHPOA (GAUZE/BANDAGES/DRESSINGS) ×1
BANDAGE GAUZE ELAST BULKY 4 IN (GAUZE/BANDAGES/DRESSINGS) ×4 IMPLANT
BENZOIN TINCTURE PRP APPL 2/3 (GAUZE/BANDAGES/DRESSINGS) ×2 IMPLANT
BIT DRILL SM SPINE QC 14 (BIT) ×1 IMPLANT
BLADE ULTRA TIP 2M (BLADE) ×2 IMPLANT
BUR BARREL STRAIGHT FLUTE 4.0 (BURR) IMPLANT
BUR MATCHSTICK NEURO 3.0 LAGG (BURR) ×2 IMPLANT
CANISTER SUCTION 2500CC (MISCELLANEOUS) ×2 IMPLANT
CLOTH BEACON ORANGE TIMEOUT ST (SAFETY) ×2 IMPLANT
CONT SPEC 4OZ CLIKSEAL STRL BL (MISCELLANEOUS) ×2 IMPLANT
COVER MAYO STAND STRL (DRAPES) ×2 IMPLANT
DRAPE C-ARM 42X72 X-RAY (DRAPES) ×4 IMPLANT
DRAPE LAPAROTOMY 100X72 PEDS (DRAPES) ×2 IMPLANT
DRAPE MICROSCOPE LEICA (MISCELLANEOUS) ×2 IMPLANT
DRAPE POUCH INSTRU U-SHP 10X18 (DRAPES) ×2 IMPLANT
DURAPREP 6ML APPLICATOR 50/CS (WOUND CARE) ×2 IMPLANT
ELECT REM PT RETURN 9FT ADLT (ELECTROSURGICAL) ×2
ELECTRODE REM PT RTRN 9FT ADLT (ELECTROSURGICAL) ×1 IMPLANT
EVACUATOR 1/8 PVC DRAIN (DRAIN) ×1 IMPLANT
GAUZE SPONGE 4X4 16PLY XRAY LF (GAUZE/BANDAGES/DRESSINGS) IMPLANT
GLOVE BIO SURGEON STRL SZ8 (GLOVE) ×1 IMPLANT
GLOVE BIOGEL M 8.0 STRL (GLOVE) ×2 IMPLANT
GLOVE EXAM NITRILE LRG STRL (GLOVE) IMPLANT
GLOVE EXAM NITRILE MD LF STRL (GLOVE) IMPLANT
GLOVE EXAM NITRILE XL STR (GLOVE) IMPLANT
GLOVE EXAM NITRILE XS STR PU (GLOVE) IMPLANT
GLOVE INDICATOR 8.5 STRL (GLOVE) ×2 IMPLANT
GLOVE SURG SS PI 8.0 STRL IVOR (GLOVE) ×1 IMPLANT
GOWN BRE IMP SLV AUR LG STRL (GOWN DISPOSABLE) ×2 IMPLANT
GOWN BRE IMP SLV AUR XL STRL (GOWN DISPOSABLE) ×2 IMPLANT
GOWN STRL REIN 2XL LVL4 (GOWN DISPOSABLE) ×1 IMPLANT
HEMOSTAT POWDER KIT SURGIFOAM (HEMOSTASIS) ×2 IMPLANT
KIT BASIN OR (CUSTOM PROCEDURE TRAY) ×2 IMPLANT
KIT ROOM TURNOVER OR (KITS) ×2 IMPLANT
NDL SPNL 22GX3.5 QUINCKE BK (NEEDLE) ×1 IMPLANT
NEEDLE SPNL 22GX3.5 QUINCKE BK (NEEDLE) ×2 IMPLANT
NS IRRIG 1000ML POUR BTL (IV SOLUTION) ×2 IMPLANT
PACK LAMINECTOMY NEURO (CUSTOM PROCEDURE TRAY) ×2 IMPLANT
PATTIES SURGICAL .5 X1 (DISPOSABLE) ×2 IMPLANT
PLATE ANT CERV XTEND 1 LV 16 (Plate) ×1 IMPLANT
PUTTY BONE GRAFT KIT 2.5ML (Bone Implant) ×1 IMPLANT
RUBBERBAND STERILE (MISCELLANEOUS) ×4 IMPLANT
SCREW XTD VAR 4.2 SELF TAP (Screw) ×4 IMPLANT
SPACER COLONIAL SMALL 8MM (Spacer) ×1 IMPLANT
SPONGE GAUZE 4X4 12PLY (GAUZE/BANDAGES/DRESSINGS) ×2 IMPLANT
SPONGE INTESTINAL PEANUT (DISPOSABLE) ×4 IMPLANT
SPONGE SURGIFOAM ABS GEL SZ50 (HEMOSTASIS) ×2 IMPLANT
STRIP CLOSURE SKIN 1/2X4 (GAUZE/BANDAGES/DRESSINGS) ×2 IMPLANT
SUT VIC AB 3-0 SH 8-18 (SUTURE) ×2 IMPLANT
SYR 20ML ECCENTRIC (SYRINGE) ×2 IMPLANT
TAPE CLOTH SURG 4X10 WHT LF (GAUZE/BANDAGES/DRESSINGS) ×1 IMPLANT
TAPE STRIPS DRAPE STRL (GAUZE/BANDAGES/DRESSINGS) ×1 IMPLANT
TOWEL OR 17X24 6PK STRL BLUE (TOWEL DISPOSABLE) ×2 IMPLANT
TOWEL OR 17X26 10 PK STRL BLUE (TOWEL DISPOSABLE) ×2 IMPLANT
WATER STERILE IRR 1000ML POUR (IV SOLUTION) ×2 IMPLANT

## 2013-04-08 NOTE — H&P (Signed)
Randy Merritt is an 72 y.o. male.   Chief Complaint: neck pain with radiation to both arms HPI: patient who came to my office complaining of neck pain with radiation to both upper extremities right worse than left. Conservative treatment has not help. Myelogram was done  Past Medical History  Diagnosis Date  . High cholesterol   . Acid reflux   . Depression     " a little"  . Anxiety   . Seasonal allergies   . History of blood transfusion   . Arthritis     Past Surgical History  Procedure Laterality Date  . Gallstone removal    . Cholecystectomy    . Cervical discectomy      x2    History reviewed. No pertinent family history. Social History:  reports that he has quit smoking. He has never used smokeless tobacco. He reports that he does not drink alcohol or use illicit drugs.  Allergies: No Known Allergies  Medications Prior to Admission  Medication Sig Dispense Refill  . chlordiazePOXIDE (LIBRIUM) 25 MG capsule Take 25 mg by mouth as needed. For anxiety.      . finasteride (PROSCAR) 5 MG tablet Take 5 mg by mouth daily.      Marland Kitchen levocetirizine (XYZAL) 5 MG tablet Take 5 mg by mouth every evening.      . pantoprazole (PROTONIX) 40 MG tablet Take 40 mg by mouth every morning.      . rosuvastatin (CRESTOR) 20 MG tablet Take 10 mg by mouth at bedtime.        No results found for this or any previous visit (from the past 48 hour(s)). No results found.  Review of Systems  Constitutional: Negative.   HENT: Positive for neck pain.   Eyes: Negative.   Respiratory: Negative.   Cardiovascular: Negative.   Gastrointestinal: Negative.   Genitourinary: Positive for urgency.  Skin: Negative.   Neurological: Positive for sensory change and focal weakness.  Psychiatric/Behavioral: The patient is nervous/anxious.     Blood pressure 128/90, pulse 75, temperature 97.7 F (36.5 C), temperature source Oral, resp. rate 20, SpO2 100.00%. Physical Exam hent,nl. Neck,anterior and  posterior scars, pain with mobility. Cv,nl. Lungs, clear, abdomen,soft. Extremities, nl. NEURO weakness of both biceps, old atrophy of hypothenar muscle in left. sensory, nl. 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 Changes at c7-t1 to the left.   Assessment/Plan Spoke with him and wife. He is having anterior decompression and fusion at c5-6. Benefits and risks were explained to them  Shedric Fredericks M 04/08/2013, 12:30 PM

## 2013-04-08 NOTE — Anesthesia Procedure Notes (Signed)
Procedure Name: Intubation Date/Time: 04/08/2013 1:21 PM Performed by: Brien Mates DOBSON Pre-anesthesia Checklist: Patient identified, Emergency Drugs available, Suction available, Patient being monitored and Timeout performed Patient Re-evaluated:Patient Re-evaluated prior to inductionOxygen Delivery Method: Circle system utilized Preoxygenation: Pre-oxygenation with 100% oxygen Intubation Type: IV induction Ventilation: Mask ventilation without difficulty Laryngoscope Size: Miller and 2 (grade 4 view with miller 2.  Grade 1 with glidescope) Grade View: Grade I Tube type: Oral Tube size: 7.5 mm Number of attempts: 2 (DL with Miller 2- unable to visualize epiglottis. no attempt at placemement of ETT.  DL x 1 with glide- grade 1 view and easy placememnt of ETT) Airway Equipment and Method: Stylet and Video-laryngoscopy Placement Confirmation: ETT inserted through vocal cords under direct vision,  positive ETCO2 and breath sounds checked- equal and bilateral Secured at: 22 cm Tube secured with: Tape Dental Injury: Teeth and Oropharynx as per pre-operative assessment

## 2013-04-08 NOTE — Transfer of Care (Signed)
Immediate Anesthesia Transfer of Care Note  Patient: Randy Merritt  Procedure(s) Performed: Procedure(s) with comments: ANTERIOR CERVICAL DECOMPRESSION/DISCECTOMY FUSION 1 LEVEL (N/A) - Cervical five-six Anterior cervical decompression/diskectomy fusion  Patient Location: PACU  Anesthesia Type:General  Level of Consciousness: sedated  Airway & Oxygen Therapy: Patient Spontanous Breathing and Patient connected to face mask oxygen  Post-op Assessment: Report given to PACU RN and Post -op Vital signs reviewed and stable  Post vital signs: Reviewed and stable  Complications: No apparent anesthesia complications

## 2013-04-08 NOTE — Progress Notes (Signed)
Op note [phone]

## 2013-04-08 NOTE — Progress Notes (Signed)
Spoke with Layla Barter, Day,RN  in Neuro OR and asked if she would ask Dr. Jeral Fruit to sign orders when he comes out of surgery.  She will do this and they will  consent signed in holding area if patient has already left short stay.

## 2013-04-08 NOTE — Anesthesia Postprocedure Evaluation (Signed)
Anesthesia Post Note  Patient: Randy Merritt  Procedure(s) Performed: Procedure(s) (LRB): ANTERIOR CERVICAL DECOMPRESSION/DISCECTOMY FUSION 1 LEVEL (N/A)  Anesthesia type: General  Patient location: PACU  Post pain: Pain level controlled and Adequate analgesia  Post assessment: Post-op Vital signs reviewed, Patient's Cardiovascular Status Stable, Respiratory Function Stable, Patent Airway and Pain level controlled  Last Vitals:  Filed Vitals:   04/08/13 1626  BP: 155/92  Pulse: 62  Temp:   Resp: 16    Post vital signs: Reviewed and stable  Level of consciousness: awake, alert  and oriented  Complications: No apparent anesthesia complications

## 2013-04-08 NOTE — Anesthesia Preprocedure Evaluation (Addendum)
Anesthesia Evaluation  Patient identified by MRN, date of birth, ID band Patient awake    Reviewed: Allergy & Precautions, H&P , NPO status , Patient's Chart, lab work & pertinent test results, reviewed documented beta blocker date and time   Airway Mallampati: II TM Distance: >3 FB Neck ROM: Full    Dental  (+) Teeth Intact, Poor Dentition and Dental Advisory Given   Pulmonary former smoker,          Cardiovascular     Neuro/Psych Anxiety    GI/Hepatic GERD-  Medicated and Controlled,  Endo/Other    Renal/GU      Musculoskeletal   Abdominal   Peds  Hematology   Anesthesia Other Findings   Reproductive/Obstetrics                           Anesthesia Physical Anesthesia Plan  ASA: II  Anesthesia Plan: General   Post-op Pain Management:    Induction: Intravenous  Airway Management Planned: Oral ETT  Additional Equipment:   Intra-op Plan:   Post-operative Plan: Extubation in OR  Informed Consent: I have reviewed the patients History and Physical, chart, labs and discussed the procedure including the risks, benefits and alternatives for the proposed anesthesia with the patient or authorized representative who has indicated his/her understanding and acceptance.   Dental advisory given  Plan Discussed with:   Anesthesia Plan Comments:        Anesthesia Quick Evaluation

## 2013-04-09 MED ORDER — PANTOPRAZOLE SODIUM 40 MG PO TBEC
40.0000 mg | DELAYED_RELEASE_TABLET | Freq: Every day | ORAL | Status: DC
  Administered 2013-04-09 – 2013-04-10 (×2): 40 mg via ORAL
  Filled 2013-04-09 (×3): qty 1

## 2013-04-09 NOTE — Op Note (Signed)
NAMEMarland Kitchen  Randy Merritt, Randy Merritt              ACCOUNT NO.:  [id number]  MEDICAL RECORD NO.:  [id number]  LOCATION:  4N20C                        FACILITY:  [facility]  PHYSICIAN:  Hilda Lias, M.D.   DATE OF BIRTH:  06-28-1941  DATE OF PROCEDURE: DATE OF DISCHARGE:                              OPERATIVE REPORT   PREOPERATIVE DIAGNOSES:  C5-C6 stenosis with bilateral C6 radiculopathy. Status post fusion 3-4.  POSTOPERATIVE DIAGNOSES:  C5-C6 stenosis with bilateral C6 radiculopathy. Status post fusion 3-4.  PROCEDURE:  Anterior 5-6 diskectomy, decompression of the spinal cord, bilateral foraminotomy, interbody fusion with cages and plate, microscope.  SURGEON:  Hilda Lias, MD  ASSISTANT:  Donalee Citrin, MD.  CLINICAL HISTORY:  Randy Merritt is a gentleman, who was seen in my office, complaining of neck pain worsened to both upper extremities. Myelogram showed that he has a stenosis about 7 mm at the level of 5-6 with foraminal narrow.  Previously this gentleman had anterior fusion at level 3-4.  Surgery was advised.  He and his wife knew the risk and benefit with surgery.  PROCEDURE IN DETAIL:  The patient was taken to the OR and after intubation, the left side of the neck was cleaned with DuraPrep.  Then drapes were applied.  Transverse incision was made through the skin and subcutaneous tissue.  Difficult part was dispatching the esophagus from the anterior wall of the spine.  The patient had quite a bit of adhesion from the previous surgery.  The lysis was accomplished.  X-ray showed indeed we were at level 5-6.  Then with the help of the microscope, we opened the disk at L5-6.  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  The posterior ligament was calcified.  Incision was made in the midline and decompression medially and laterally was accomplished with plenty of room for the spinal cord on both C6 nerve roots.  Then, the  endplate was drilled.  A cage 8 mm height, lordotic with autograft was inserted followed by the plate using 4 screws.  Lateral cervical spine showed good position of the cages and screws.  Then, the area was irrigated. We waited 10 minutes just to be sure that we have good hemostasis.  Once this was achieved, the wound was closed with Vicryl and Steri-Strips.          ______________________________ Hilda Lias, M.D.     EB/MEDQ  D:  04/08/2013  T:  04/09/2013  Job:  [id number]

## 2013-04-09 NOTE — Evaluation (Signed)
Physical Therapy Evaluation Patient Details Name: Randy Merritt MRN: [id number] DOB: [birth year] Today's Date: 04/09/2013 Time: 4098-1191 PT Time Calculation (min): 16 min  PT Assessment / Plan / Recommendation Clinical Impression  [name].  Pt mobilizing well and only needed help for IV management with amb.  Encouraged pt to amb with wife later today. No further PT needed.    PT Assessment  Patent does not need any further PT services    Follow Up Recommendations  No PT follow up    Does the patient have the potential to tolerate intense rehabilitation      Barriers to Discharge        Equipment Recommendations  None recommended by PT    Recommendations for Other Services     Frequency      Precautions / Restrictions Precautions Precautions: Fall;Cervical Precaution Comments: hemovac drain Required Braces or Orthoses: Cervical Brace Cervical Brace: Soft collar;Applied in supine position   Pertinent Vitals/Pain       Mobility  Bed Mobility Bed Mobility: Supine to Sit;Sitting - Scoot to Edge of Bed Supine to Sit: 5: Supervision;HOB elevated;With rails Sitting - Scoot to Edge of Bed: 5: Supervision;With rail Details for Bed Mobility Assistance: pt attempting to exit on arrival. OT to address bed mobility from flat surface next session to simulate home Transfers Sit to Stand: 6: Modified independent (Device/Increase time);With upper extremity assist;From bed Stand to Sit: 6: Modified independent (Device/Increase time);With upper extremity assist;With armrests;To chair/3-in-1 Ambulation/Gait Ambulation/Gait Assistance: 5: Supervision (for IV pole only) Ambulation Distance (Feet): 500 Feet Assistive device: None Gait Pattern: Within Functional Limits Stairs: Yes Stairs Assistance: 4: Min assist Stairs Assistance Details (indicate cue type and reason): hand-held due to no rail at home. Verbal cues to be careful placing feet coming down due to decr lower  periphereal vision due to neck brace. Stair Management Technique: No rails;Forwards    Exercises     PT Diagnosis:    PT Problem List:   PT Treatment Interventions:     PT Goals    Visit Information  Last PT Received On: 04/09/13 Assistance Needed: +1    Subjective Data  Subjective: Pt states he is glad to get up. Patient Stated Goal: Return home   Prior Functioning  Home Living Lives With: Spouse Available Help at Discharge: Family Type of Home: House Home Access: Stairs to enter Secretary/administrator of Steps: 3 Home Layout: One level Bathroom Shower/Tub: Forensic psychologist: None Prior Function Level of Independence: Independent Able to Take Stairs?: Yes Driving: Yes Communication Communication: No difficulties Dominant Hand: Right    Cognition  Cognition Arousal/Alertness: Awake/alert Behavior During Therapy: WFL for tasks assessed/performed Overall Cognitive Status: Within Functional Limits for tasks assessed    Extremity/Trunk Assessment Right Upper Extremity Assessment RUE ROM/Strength/Tone: Unable to fully assess;Due to precautions (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) RUE Coordination: WFL - gross motor (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) Left Upper Extremity Assessment LUE ROM/Strength/Tone: Unable to fully assess;Due to precautions LUE Coordination: WFL - gross motor (DIP flexion contractures) Right Lower Extremity Assessment RLE ROM/Strength/Tone: [facility] for tasks assessed Left Lower Extremity Assessment LLE ROM/Strength/Tone: [facility] for tasks assessed Trunk Assessment Trunk Assessment: Normal   Balance Balance Balance Assessed: Yes Static Standing Balance Static Standing - Balance Support: No upper extremity supported;During functional activity Static Standing - Level of Assistance: 5: Stand by assistance Static Standing - Comment/# of Minutes: ~5 minutes ( voiding bladder and grooming task  at sink level)  End of Session PT - End of Session Equipment Utilized During Treatment: Gait belt;Cervical collar Activity Tolerance: Patient tolerated treatment well Patient left: in chair;with call bell/phone within reach;with family/visitor present Nurse Communication: Mobility status  GP     [facility] 04/09/2013, 11:50 AM  [facility] PT [phone]

## 2013-04-09 NOTE — Progress Notes (Signed)
UR COMPLETED  

## 2013-04-09 NOTE — Evaluation (Signed)
Occupational Therapy Evaluation Patient Details Name: Randy Merritt MRN: [id number] DOB: 01-21-1941 Today's Date: 04/09/2013 Time: 4098-1191 OT Time Calculation (min): 23 min  OT Assessment / Plan / Recommendation Clinical Impression    72 yo male s/p ACDF C5-6 that could benefit from skilled OT acutely. Recommend no follow up at this time however patient may benefit from it at follow up with DR Jeral Fruit.     OT Assessment  Patient needs continued OT Services    Follow Up Recommendations  Other (comment) (outpatient may be needed at following up in two weeks)    Barriers to Discharge      Equipment Recommendations  None recommended by OT    Recommendations for Other Services    Frequency  Min 2X/week    Precautions / Restrictions Precautions Precautions: Fall;Cervical Required Braces or Orthoses: Cervical Brace Cervical Brace: Soft collar;Applied in supine position   Pertinent Vitals/Pain No pain reported    ADL  Grooming: Wash/dry hands;Teeth care;Set up Where Assessed - Grooming: Unsupported standing Lower Body Dressing: Modified independent (able to cross bil LE in sitting) Where Assessed - Lower Body Dressing: Unsupported sitting Toilet Transfer: Supervision/safety Toilet Transfer Method:  (standing) Toilet Transfer Equipment: Regular height toilet (stand to void bladder) Toileting - Clothing Manipulation and Hygiene: Supervision/safety Where Assessed - Toileting Clothing Manipulation and Hygiene: Standing Equipment Used: Gait belt Transfers/Ambulation Related to ADLs: Pt ambulated to bathroom with hand held (A) due to first time walking. pt placing hands on environmental supports. Ambulating out of bathroom pt with incr confidence and decr reaching. pt with a guarded gait  ADL Comments: Pt attempting OOB on arrival with wife attempting to stop patient. Pt with oxgyen in nose attached to Left side of the bed , bil SCD don and bed foot elevated. Pt at high risk for fall in  current state. Pt educated on need for assistance and to remove all fall risk. Pt agreeable and expressed need to use urinal standing. Pt allowed to ambulate to the bathroom and very excited to exit the bed. Pt provided handout and educated on ADLS. Ot to follow for tub shower transfer and review cervical precautions (don/ doff brace)    OT Diagnosis: Generalized weakness;Acute pain  OT Problem List: Decreased strength;Decreased activity tolerance;Impaired balance (sitting and/or standing);Decreased safety awareness;Decreased knowledge of use of DME or AE;Decreased knowledge of precautions;Pain OT Treatment Interventions: Self-care/ADL training;Therapeutic exercise;DME and/or AE instruction;Therapeutic activities;Patient/family education;Balance training   OT Goals Acute Rehab OT Goals OT Goal Formulation: With patient Time For Goal Achievement: 04/23/13 Potential to Achieve Goals: Good ADL Goals Pt Will Perform Lower Body Dressing: with modified independence;Sit to stand from chair;Unsupported ADL Goal: Lower Body Dressing - Progress: Goal set today Pt Will Perform Tub/Shower Transfer: Tub transfer;with modified independence;Ambulation ADL Goal: Tub/Shower Transfer - Progress: Goal set today Miscellaneous OT Goals Miscellaneous OT Goal #1: Pt will complete bed mobility MOD I as precursor to adls OT Goal: Miscellaneous Goal #1 - Progress: Goal set today  Visit Information  Last OT Received On: 04/09/13 Assistance Needed: +1    Subjective Data  Subjective: "I am beginning to like you" Patient Stated Goal: to return home    Prior Functioning     Home Living Lives With: Spouse Available Help at Discharge: Family Type of Home: House Home Access: Stairs to enter Secretary/administrator of Steps: 3 Home Layout: One level Bathroom Shower/Tub: Forensic scientist: Standard Home Adaptive Equipment: None Prior Function Level of Independence: Independent Able to  Take Stairs?:  Yes Driving: Yes Communication Communication: No difficulties Dominant Hand: Right         Vision/Perception Vision - History Baseline Vision: Wears glasses only for reading Patient Visual Report: No change from baseline   Cognition  Cognition Arousal/Alertness: Awake/alert Behavior During Therapy: WFL for tasks assessed/performed Overall Cognitive Status: Within Functional Limits for tasks assessed    Extremity/Trunk Assessment Right Upper Extremity Assessment RUE ROM/Strength/Tone: Unable to fully assess;Due to precautions (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 OT to address bed mobility from flat surface next session to simulate home Transfers Transfers: Sit to Stand;Stand to Sit Sit to Stand: 4: Min guard;With upper extremity assist;From bed Stand to Sit: 4: Min guard;With upper extremity assist;To chair/3-in-1     Exercise     Balance Balance Balance Assessed: Yes Static Standing Balance Static Standing - Balance Support: No upper extremity supported;During functional activity Static Standing - Level of Assistance: 5: Stand by assistance Static Standing - Comment/# of Minutes: ~5 minutes ( voiding bladder and grooming task at sink level)   End of Session OT - End of Session Activity Tolerance: Patient tolerated treatment well Patient left: in bed;with call bell/phone within reach;with family/visitor  present Nurse Communication: Mobility status;Precautions  GO     Lucile Shutters 04/09/2013, 10:21 AM Pager: [phone]

## 2013-04-09 NOTE — Progress Notes (Signed)
OT NOTE  DR Jeral Fruit- Please provide order regarding taking brace on and off and showering order for d/c home. OT will educate patient based on orders to better prepare for discharge. Thank you   Lucile Shutters   OTR/L Pager: [phone] Office: [phone] .

## 2013-04-09 NOTE — Progress Notes (Signed)
Patient ID: Randy Merritt, male   DOB: 19-Jan-1941, 72 y.o.   MRN: [id number] No weakness, minimak drainage, voice normal and swallows well.

## 2013-04-10 ENCOUNTER — Encounter (HOSPITAL_COMMUNITY): Payer: Self-pay | Admitting: Neurosurgery

## 2013-04-10 NOTE — Care Management Note (Signed)
    Page 1 of 1   04/10/2013     12:01:09 PM   CARE MANAGEMENT NOTE 04/10/2013  Patient:  Randy Merritt, Randy Merritt   Account Number:  [id number]  Date Initiated:  04/09/2013  Documentation initiated by:  Jacquelynn Cree  Subjective/Objective Assessment:   Admitted postop ACDF C5-6     Action/Plan:   PT/OT evals- no follow up or equipment recommended   Anticipated DC Date:  04/12/2013   Anticipated DC Plan:  HOME/SELF CARE      DC Planning Services  CM consult      Choice offered to / List presented to:             Status of service:  Completed, signed off Medicare Important Message given?   (If response is "NO", the following Medicare IM given date fields will be blank) Date Medicare IM given:   Date Additional Medicare IM given:    Discharge Disposition:  HOME/SELF CARE  Per UR Regulation:  Reviewed for med. necessity/level of care/duration of stay  If discussed at Long Length of Stay Meetings, dates discussed:    Comments:

## 2013-04-10 NOTE — Discharge Summary (Signed)
Physician Discharge Summary  Patient ID: Randy Merritt MRN: [id number] DOB/AGE: 11/22/1940 72 y.o.  Admit date: 04/08/2013 Discharge date: 04/10/2013  Admission Diagnoses:cervical 5-6 stenosis  Discharge Diagnoses: same Active Problems:   * No active hospital problems. *   Discharged Condition: no pain  Hospital Course: surgery  Consultsnone  Significant Diagnostic Studies: myelogram  Treatments: cervical fusion  Discharge Exam: Blood pressure 135/74, pulse 80, temperature 98.2 F (36.8 C), temperature source Oral, resp. rate 20, height 5\' 8"  (1.727 m), weight 80.6 kg (177 lb 11.1 oz), SpO2 99.00%. Ambulating, no pain  Disposition: home     Medication List    ASK your doctor about these medications       chlordiazePOXIDE 25 MG capsule  Commonly known as:  LIBRIUM  Take 25 mg by mouth as needed. For anxiety.     finasteride 5 MG tablet  Commonly known as:  PROSCAR  Take 5 mg by mouth daily.     levocetirizine 5 MG tablet  Commonly known as:  XYZAL  Take 5 mg by mouth every evening.     pantoprazole 40 MG tablet  Commonly known as:  PROTONIX  Take 40 mg by mouth every morning.     rosuvastatin 20 MG tablet  Commonly known as:  CRESTOR  Take 10 mg by mouth at bedtime.         Signed: Karn Cassis 04/10/2013, 10:21 AM

## 2013-04-10 NOTE — Progress Notes (Signed)
Pt discharged home with wife. Discharge instruction  Given / reminded to call for appiontmenst and see Md in 2-3 weeks. Pt verbalized good understanding SL removed intact.   Incision site with steri strip intact and no s/s of infection or swollen noted . Condition at discharge is stable  . Azzie Roup RN.

## 2014-02-10 ENCOUNTER — Emergency Department (HOSPITAL_COMMUNITY)
Admission: EM | Admit: 2014-02-10 | Discharge: 2014-02-10 | Disposition: A | Discharge status: Home / Self Care | Payer: Medicare Other | Attending: Emergency Medicine | Admitting: Emergency Medicine

## 2014-02-10 ENCOUNTER — Encounter (HOSPITAL_COMMUNITY): Payer: Self-pay | Admitting: Emergency Medicine

## 2014-02-10 ENCOUNTER — Emergency Department (HOSPITAL_COMMUNITY): Payer: Medicare Other

## 2014-02-10 DIAGNOSIS — F3289 Other specified depressive episodes: Secondary | ICD-10-CM | POA: Insufficient documentation

## 2014-02-10 DIAGNOSIS — F329 Major depressive disorder, single episode, unspecified: Secondary | ICD-10-CM | POA: Insufficient documentation

## 2014-02-10 DIAGNOSIS — R05 Cough: Secondary | ICD-10-CM | POA: Insufficient documentation

## 2014-02-10 DIAGNOSIS — R059 Cough, unspecified: Secondary | ICD-10-CM | POA: Insufficient documentation

## 2014-02-10 DIAGNOSIS — R079 Chest pain, unspecified: Secondary | ICD-10-CM | POA: Insufficient documentation

## 2014-02-10 DIAGNOSIS — R221 Localized swelling, mass and lump, neck: Principal | ICD-10-CM

## 2014-02-10 DIAGNOSIS — Z87891 Personal history of nicotine dependence: Secondary | ICD-10-CM | POA: Insufficient documentation

## 2014-02-10 DIAGNOSIS — M542 Cervicalgia: Secondary | ICD-10-CM

## 2014-02-10 DIAGNOSIS — R220 Localized swelling, mass and lump, head: Secondary | ICD-10-CM | POA: Insufficient documentation

## 2014-02-10 DIAGNOSIS — Z8739 Personal history of other diseases of the musculoskeletal system and connective tissue: Secondary | ICD-10-CM | POA: Insufficient documentation

## 2014-02-10 DIAGNOSIS — K219 Gastro-esophageal reflux disease without esophagitis: Secondary | ICD-10-CM | POA: Insufficient documentation

## 2014-02-10 DIAGNOSIS — F411 Generalized anxiety disorder: Secondary | ICD-10-CM | POA: Insufficient documentation

## 2014-02-10 DIAGNOSIS — E7800 Pure hypercholesterolemia, unspecified: Secondary | ICD-10-CM | POA: Insufficient documentation

## 2014-02-10 DIAGNOSIS — R1310 Dysphagia, unspecified: Secondary | ICD-10-CM | POA: Insufficient documentation

## 2014-02-10 LAB — CBC WITH DIFFERENTIAL/PLATELET
Basophils Absolute: 0 10*3/uL (ref 0.0–0.1)
Basophils Relative: 0 % (ref 0–1)
Eosinophils Absolute: 0.1 10*3/uL (ref 0.0–0.7)
Eosinophils Relative: 2 % (ref 0–5)
HCT: 39 % (ref 39.0–52.0)
Hemoglobin: 13.6 g/dL (ref 13.0–17.0)
Lymphocytes Relative: 33 % (ref 12–46)
Lymphs Abs: 1.5 10*3/uL (ref 0.7–4.0)
MCH: 32.1 pg (ref 26.0–34.0)
MCHC: 34.9 g/dL (ref 30.0–36.0)
MCV: 92 fL (ref 78.0–100.0)
Monocytes Absolute: 0.3 10*3/uL (ref 0.1–1.0)
Monocytes Relative: 7 % (ref 3–12)
Neutro Abs: 2.6 10*3/uL (ref 1.7–7.7)
Neutrophils Relative %: 58 % (ref 43–77)
Platelets: 131 10*3/uL — ABNORMAL LOW (ref 150–400)
RBC: 4.24 MIL/uL (ref 4.22–5.81)
RDW: 12.9 % (ref 11.5–15.5)
WBC: 4.5 10*3/uL (ref 4.0–10.5)

## 2014-02-10 LAB — COMPREHENSIVE METABOLIC PANEL
ALT: 16 U/L (ref 0–53)
AST: 23 U/L (ref 0–37)
Albumin: 4.1 g/dL (ref 3.5–5.2)
Alkaline Phosphatase: 112 U/L (ref 39–117)
BUN: 10 mg/dL (ref 6–23)
CO2: 28 mEq/L (ref 19–32)
Calcium: 9.4 mg/dL (ref 8.4–10.5)
Chloride: 103 mEq/L (ref 96–112)
Creatinine, Ser: 1.29 mg/dL (ref 0.50–1.35)
GFR calc Af Amer: 62 mL/min — ABNORMAL LOW (ref >90)
GFR calc non Af Amer: 53 mL/min — ABNORMAL LOW (ref >90)
Glucose, Bld: 90 mg/dL (ref 70–99)
Potassium: 4.4 mEq/L (ref 3.7–5.3)
Sodium: 141 mEq/L (ref 137–147)
Total Bilirubin: 0.4 mg/dL (ref 0.3–1.2)
Total Protein: 6.9 g/dL (ref 6.0–8.3)

## 2014-02-10 LAB — TROPONIN I: Troponin I: <0.3 ng/mL (ref <0.30)

## 2014-02-10 MED ORDER — SODIUM CHLORIDE 0.9 % IV BOLUS (SEPSIS)
250.0000 mL | Freq: Once | INTRAVENOUS | Status: AC
  Administered 2014-02-10: 250 mL via INTRAVENOUS

## 2014-02-10 MED ORDER — SODIUM CHLORIDE 0.9 % IV SOLN
INTRAVENOUS | Status: DC
  Administered 2014-02-10: 75 mL/h via INTRAVENOUS

## 2014-02-10 MED ORDER — PANTOPRAZOLE SODIUM 40 MG IV SOLR
40.0000 mg | Freq: Once | INTRAVENOUS | Status: AC
  Administered 2014-02-10: 40 mg via INTRAVENOUS
  Filled 2014-02-10: qty 40

## 2014-02-10 MED ORDER — IOHEXOL 300 MG/ML  SOLN
75.0000 mL | Freq: Once | INTRAMUSCULAR | Status: AC | PRN
  Administered 2014-02-10: 75 mL via INTRAVENOUS

## 2014-02-10 MED ORDER — ONDANSETRON HCL 4 MG/2ML IJ SOLN: 4.0000 mg | Freq: Once | INTRAMUSCULAR | Status: DC

## 2014-02-10 MED ORDER — SODIUM CHLORIDE 0.9 % IV SOLN
INTRAVENOUS | Status: DC
Start: 2014-02-11 — End: 2014-02-10

## 2014-02-10 NOTE — ED Provider Notes (Signed)
9:49 AM Medical screening exam in [facility].   73 year old man with voice change and soreness with swallowing that began yesterday.  Also with anterior neck swelling. Tolerating oral secretions, no respiratory distress.  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  Hx anterior entry cervical fusion April 2014.  Poor historian.    Filed Vitals:   02/10/14 0937  BP: 146/87  Pulse: 71  Temp: 98.3 F (36.8 C)  Resp: [address], PA-C 02/10/14 [phone]

## 2014-02-10 NOTE — ED Provider Notes (Signed)
CSN: [id number]     Arrival date & time 02/10/14  6301 History   First MD Initiated Contact with Patient 02/10/14 0957     Chief Complaint  Patient presents with  . neck swelling      (Consider location/radiation/quality/duration/timing/severity/associated sxs/prior Treatment) The history is provided by the patient.   73 year old male. Drove himself to the emergency department. Stating that his throat is not felt right for the last year husband worse last a week or so. Patient was scheduled to see his record Dr. this afternoon but decided to come in here instead. He's had some hoarseness some difficulty swallowing but he can swallow food and liquids. Patient also has some intermittent chest discomfort. Patient's had prior surgery to the anterior part of his neck. The cervical. No significant pain. As stated above he has been worse the last week or 2.  Past Medical History  Diagnosis Date  . High cholesterol   . Acid reflux   . Depression     " a little"  . Anxiety   . Seasonal allergies   . History of blood transfusion   . Arthritis    Past Surgical History  Procedure Laterality Date  . Gallstone removal    . Cholecystectomy    . Cervical discectomy      x2  . Anterior cervical decomp/discectomy fusion N/A 04/08/2013    Procedure: ANTERIOR CERVICAL DECOMPRESSION/DISCECTOMY FUSION 1 LEVEL;  Surgeon: Floyce Stakes, MD;  Location: [facility];  Service: Neurosurgery;  Laterality: N/A;  Cervical five-six Anterior cervical decompression/diskectomy fusion   No family history on file. History  Substance Use Topics  . Smoking status: Former Research scientist (life sciences)  . Smokeless tobacco: Never Used  . Alcohol Use: No    Review of Systems  Constitutional: Negative for fever.  HENT: Positive for trouble swallowing. Negative for congestion.   Eyes: Negative for visual disturbance.  Respiratory: Positive for cough. Negative for shortness of breath.   Cardiovascular: Positive for chest pain.   Gastrointestinal: Negative for nausea, vomiting and abdominal pain.  Genitourinary: Negative for dysuria.  Musculoskeletal: Negative for back pain.  Skin: Negative for rash.  Neurological: Negative for headaches.  Hematological: Does not bruise/bleed easily.  Psychiatric/Behavioral: Negative for confusion.      Allergies  Review of patient's allergies indicates no known allergies.  Home Medications   Current Outpatient Rx  Name  Route  Sig  Dispense  Refill  . chlordiazePOXIDE (LIBRIUM) 25 MG capsule   Oral   Take 25 mg by mouth as needed. For anxiety.         . finasteride (PROSCAR) 5 MG tablet   Oral   Take 5 mg by mouth at bedtime.          Marland Kitchen levocetirizine (XYZAL) 5 MG tablet   Oral   Take 5 mg by mouth every evening.         . pantoprazole (PROTONIX) 40 MG tablet   Oral   Take 40 mg by mouth at bedtime.          . rosuvastatin (CRESTOR) 20 MG tablet   Oral   Take 20 mg by mouth at bedtime.          . tamsulosin (FLOMAX) 0.4 MG CAPS capsule   Oral   Take 0.4 mg by mouth at bedtime.          BP 147/105  Pulse 66  Temp(Src) 98.3 F (36.8 C)  Resp 13  SpO2 98% Physical Exam  Nursing note and vitals reviewed. Constitutional: He is oriented to person, place, and time. He appears well-developed and well-nourished. No distress.  HENT:  Head: Normocephalic and atraumatic.  Mouth/Throat: Oropharynx is clear and moist.  Eyes: Conjunctivae and EOM are normal. Pupils are equal, round, and reactive to light.  Neck: Normal range of motion. No tracheal deviation present. No thyromegaly present.  Cardiovascular: Normal rate, regular rhythm and normal heart sounds.   No murmur heard. Pulmonary/Chest: Effort normal and breath sounds normal. No stridor. No respiratory distress.  Abdominal: Soft. Bowel sounds are normal. There is no tenderness.  Musculoskeletal: Normal range of motion.  Lymphadenopathy:    He has no cervical adenopathy.  Neurological: He  is alert and oriented to person, place, and time. No cranial nerve deficit. He exhibits normal muscle tone. Coordination normal.  Skin: Skin is warm. No rash noted.    ED Course  Procedures (including critical care time) Labs Review Labs Reviewed  COMPREHENSIVE METABOLIC PANEL - Abnormal; Notable for the following:    GFR calc non Af Amer 53 (*)    GFR calc Af Amer 62 (*)    All other components within normal limits  CBC WITH DIFFERENTIAL - Abnormal; Notable for the following:    Platelets 131 (*)    All other components within normal limits  TROPONIN I   Results for orders placed during the hospital encounter of 02/10/14  COMPREHENSIVE METABOLIC PANEL      Result Value Ref Range   Sodium 141  137 - 147 mEq/L   Potassium 4.4  3.7 - 5.3 mEq/L   Chloride 103  96 - 112 mEq/L   CO2 28  19 - 32 mEq/L   Glucose, Bld 90  70 - 99 mg/dL   BUN 10  6 - 23 mg/dL   Creatinine, Ser 1.29  0.50 - 1.35 mg/dL   Calcium 9.4  8.4 - 10.5 mg/dL   Total Protein 6.9  6.0 - 8.3 g/dL   Albumin 4.1  3.5 - 5.2 g/dL   AST 23  0 - 37 U/L   ALT 16  0 - 53 U/L   Alkaline Phosphatase 112  39 - 117 U/L   Total Bilirubin 0.4  0.3 - 1.2 mg/dL   GFR calc non Af Amer 53 (*) >90 mL/min   GFR calc Af Amer 62 (*) >90 mL/min  CBC WITH DIFFERENTIAL      Result Value Ref Range   WBC 4.5  4.0 - 10.5 K/uL   RBC 4.24  4.22 - 5.81 MIL/uL   Hemoglobin 13.6  13.0 - 17.0 g/dL   HCT 39.0  39.0 - 52.0 %   MCV 92.0  78.0 - 100.0 fL   MCH 32.1  26.0 - 34.0 pg   MCHC 34.9  30.0 - 36.0 g/dL   RDW 12.9  11.5 - 15.5 %   Platelets 131 (*) 150 - 400 K/uL   Neutrophils Relative % 58  43 - 77 %   Neutro Abs 2.6  1.7 - 7.7 K/uL   Lymphocytes Relative 33  12 - 46 %   Lymphs Abs 1.5  0.7 - 4.0 K/uL   Monocytes Relative 7  3 - 12 %   Monocytes Absolute 0.3  0.1 - 1.0 K/uL   Eosinophils Relative 2  0 - 5 %   Eosinophils Absolute 0.1  0.0 - 0.7 K/uL   Basophils Relative 0  0 - 1 %   Basophils Absolute 0.0  0.0 -  0.1 K/uL   TROPONIN I      Result Value Ref Range   Troponin I <0.30  <0.30 ng/mL    Imaging Review Dg Chest 2 View  02/10/2014   CLINICAL DATA:  Sore throat and neck swelling.  EXAM: CHEST - 2 VIEW  COMPARISON:  DG CHEST 2 VIEW dated 12/27/2012  FINDINGS: Stable scarring/atelectasis in the right lower lung. There is no evidence of pulmonary edema, consolidation, pneumothorax, nodule or pleural fluid. The heart size is within normal limits. There is stable mild tortuosity of the thoracic aorta. Stable degenerative changes are present of the thoracic spine.  IMPRESSION: No active disease.   Electronically Signed   By: Aletta Edouard M.D.   On: 02/10/2014 11:22   Ct Soft Tissue Neck W Contrast  02/10/2014   CLINICAL DATA:  Sore throat, voice change.  Anterior neck swelling.  EXAM: CT NECK WITH CONTRAST  TECHNIQUE: Multidetector CT imaging of the neck was performed using the standard protocol following the bolus administration of intravenous contrast.  CONTRAST:  70mL OMNIPAQUE IOHEXOL 300 MG/ML  SOLN  COMPARISON:  Prior thyroid ultrasound report from 11/07/2010.  FINDINGS: The visualized portions of the brain and orbits are within normal limits. The paranasal sinuses are clear. No mastoid effusion.  The salivary glands including the parotid glands and submandibular glands are normal. The oral cavity is within normal limits without evidence of loculated fluid collection or mass lesion. Palatine and lingual tonsils are normal. Vallecula is clear. Epiglottis is normal. Oropharynx and nasopharynx are within normal limits. No retropharyngeal collection. Parapharyngeal fat is preserved. The hypopharynx and supraglottic larynx are normal. True vocal cords are symmetric bilaterally. Subglottic airway is clear.  The thyroid is enlarged. Heterogeneous solid nodule measuring 3.9 x 3.4 x 4.8 cm seen within the right thyroid lobe (series 2, image 84). Smaller heterogeneous solid nodule measuring 1.6 x 1.6 x 2.0 cm seen within the  inferior left thyroid lobe. These findings are grossly similar relative to previous ultrasound report from 11/07/2010. 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  No pathologically enlarged lymph nodes are identified within the neck. No mass lesion or loculated fluid collection. Visualized superior mediastinum is within normal limits without evidence of adenopathy or other abnormality.  Visualized lung apices are clear.  Normal intravascular enhancement seen throughout the neck.  Patient is status post ACDF at the C5-6 level. Overall position alignment of the hardware is unchanged relative to previous radiograph from 04/20/2013. Bony ankylosis seen at the C3-4 level. Prominent anterior osteophytes seen at the C2-3 articulation on the left. 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 No worrisome lytic or blastic osseous lesions. Mild degenerative height loss seen at the superior aspect of T4.  IMPRESSION: 1. No inflammatory process or other acute abnormality identified within the neck. No loculated fluid collection, mass lesion, or cervical adenopathy. 2. Enlarged bilateral heterogeneous solid thyroid nodules as above. Per EPIC, this has been biopsied previously and found to be compatible with multinodular quarter. 3. 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   Electronically Signed   By: Jeannine Boga M.D.   On: 02/10/2014 13:49     EKG Interpretation None      Date: 02/10/2014  Rate: 66  Rhythm: normal sinus rhythm  QRS Axis: normal  Intervals: normal  ST/T Wave abnormalities: nonspecific ST/T changes and early repolarization  Conduction Disutrbances:none  Narrative Interpretation:  Old EKG Reviewed: none available Did not cross into MUSE      MDM   Final diagnoses:  Neck discomfort    Patient with concern for difficulty swallowing, fullness  feeling in the anterior part of his neck feels as if there is a knot in his throat. Voice is hoarse. Patient states is able to swallow foods and liquids fine. Occasionally gets some chest discomfort.  Workup here without any significant findings CT of the neck with contrast without any sniffing abnormalities other than some thyroid goiter. Patient supposedly has had problems with thyroid in the past. Additional workup for this will be needed. Patient had some brief chest pain EKG showed no acute changes and troponin was negative. No leukocytosis no anemia no significant electrolyte abnormalities. Patient can be discharged home and followup with primary care Dr. For additional workup of the thyroid. Patient is already on protonix.    Mervin Kung, MD 02/10/14 [id number]

## 2014-02-10 NOTE — ED Notes (Signed)
Pt c/o "something's not right in my throat" since ACDF last year. Also has developed swelling to right side of neck for several days/weeks (?). Voice sounds hoarse. No airway compromise at this time. States is able to swallow food and fluids.

## 2014-02-10 NOTE — ED Notes (Addendum)
Cut his grass yesterday and got sorethroat after that is hoarse hurts to swollow

## 2014-02-10 NOTE — ED Provider Notes (Signed)
Medical screening examination/treatment/procedure(s) were conducted as a shared visit with non-physician practitioner(s) and myself.  I personally evaluated the patient during the encounter.   EKG Interpretation None       Mervin Kung, MD 02/10/14 1014

## 2014-02-10 NOTE — ED Notes (Signed)
States has not been able to button dress shirts or wear ties in the past few months. Neck swollen--

## 2014-02-10 NOTE — ED Notes (Signed)
PA ordered pt moved to acute side. Moved to POD B 14

## 2014-02-10 NOTE — ED Notes (Signed)
Report given to K. Cobb, Therapist, sports.

## 2014-02-10 NOTE — Discharge Instructions (Signed)
Followup with your regular Dr. about the enlarged thyroid. Thyroid function test would be appropriate. Return for any newer worse symptoms. Rest of workup today was negative.

## 2014-02-10 NOTE — ED Notes (Signed)
Convoyed to EDP patients complaint of Acid Reflux.

## 2014-02-10 NOTE — ED Notes (Signed)
Pt denies any nausea at present-- does not want Zofran at present

## 2014-05-16 ENCOUNTER — Encounter (HOSPITAL_COMMUNITY): Payer: Self-pay | Admitting: Emergency Medicine

## 2014-05-16 ENCOUNTER — Emergency Department (HOSPITAL_COMMUNITY): Payer: 59

## 2014-05-16 ENCOUNTER — Emergency Department (HOSPITAL_COMMUNITY)
Admission: EM | Admit: 2014-05-16 | Discharge: 2014-05-16 | Disposition: A | Discharge status: Home / Self Care | Payer: 59 | Attending: Emergency Medicine | Admitting: Emergency Medicine

## 2014-05-16 DIAGNOSIS — E7800 Pure hypercholesterolemia, unspecified: Secondary | ICD-10-CM | POA: Insufficient documentation

## 2014-05-16 DIAGNOSIS — F411 Generalized anxiety disorder: Secondary | ICD-10-CM | POA: Insufficient documentation

## 2014-05-16 DIAGNOSIS — Z8739 Personal history of other diseases of the musculoskeletal system and connective tissue: Secondary | ICD-10-CM | POA: Insufficient documentation

## 2014-05-16 DIAGNOSIS — K219 Gastro-esophageal reflux disease without esophagitis: Secondary | ICD-10-CM | POA: Insufficient documentation

## 2014-05-16 DIAGNOSIS — R109 Unspecified abdominal pain: Secondary | ICD-10-CM | POA: Insufficient documentation

## 2014-05-16 DIAGNOSIS — Z8709 Personal history of other diseases of the respiratory system: Secondary | ICD-10-CM | POA: Insufficient documentation

## 2014-05-16 DIAGNOSIS — Z87891 Personal history of nicotine dependence: Secondary | ICD-10-CM | POA: Insufficient documentation

## 2014-05-16 DIAGNOSIS — Z79899 Other long term (current) drug therapy: Secondary | ICD-10-CM | POA: Insufficient documentation

## 2014-05-16 LAB — URINALYSIS, ROUTINE W REFLEX MICROSCOPIC
Bilirubin Urine: NEGATIVE
Glucose, UA: NEGATIVE mg/dL
Ketones, ur: 15 mg/dL — AB
Leukocytes, UA: NEGATIVE
Nitrite: NEGATIVE
Protein, ur: 30 mg/dL — AB
Specific Gravity, Urine: 1.019 (ref 1.005–1.030)
Urobilinogen, UA: 1 mg/dL (ref 0.0–1.0)
pH: 8.5 — ABNORMAL HIGH (ref 5.0–8.0)

## 2014-05-16 LAB — URINE MICROSCOPIC-ADD ON

## 2014-05-16 LAB — I-STAT CHEM 8, ED
BUN: 12 mg/dL (ref 6–23)
Calcium, Ion: 1.23 mmol/L (ref 1.13–1.30)
Chloride: 102 mEq/L (ref 96–112)
Creatinine, Ser: 1.7 mg/dL — ABNORMAL HIGH (ref 0.50–1.35)
Glucose, Bld: 103 mg/dL — ABNORMAL HIGH (ref 70–99)
HCT: 40 % (ref 39.0–52.0)
Hemoglobin: 13.6 g/dL (ref 13.0–17.0)
Potassium: 3.7 mEq/L (ref 3.7–5.3)
Sodium: 138 mEq/L (ref 137–147)
TCO2: 25 mmol/L (ref 0–100)

## 2014-05-16 MED ORDER — OXYCODONE-ACETAMINOPHEN 5-325 MG PO TABS
1.0000 | ORAL_TABLET | Freq: Once | ORAL | Status: AC
  Administered 2014-05-16: 1 via ORAL
  Filled 2014-05-16: qty 1

## 2014-05-16 MED ORDER — FENTANYL CITRATE 0.05 MG/ML IJ SOLN
50.0000 ug | Freq: Once | INTRAMUSCULAR | Status: AC
  Administered 2014-05-16: 50 ug via INTRAVENOUS
  Filled 2014-05-16: qty 2

## 2014-05-16 MED ORDER — ONDANSETRON HCL 4 MG/2ML IJ SOLN
4.0000 mg | Freq: Once | INTRAMUSCULAR | Status: AC
  Administered 2014-05-16: 4 mg via INTRAVENOUS
  Filled 2014-05-16: qty 2

## 2014-05-16 MED ORDER — HYDROMORPHONE HCL PF 1 MG/ML IJ SOLN
1.0000 mg | Freq: Once | INTRAMUSCULAR | Status: AC
  Administered 2014-05-16: 1 mg via INTRAVENOUS
  Filled 2014-05-16: qty 1

## 2014-05-16 MED ORDER — OXYCODONE-ACETAMINOPHEN 5-325 MG PO TABS: 1.0000 | ORAL_TABLET | Freq: Four times a day (QID) | ORAL | Status: DC | PRN

## 2014-05-16 NOTE — ED Notes (Addendum)
Called CT; he is next in line per CT tech. Informed patient and family of delay and status.

## 2014-05-16 NOTE — ED Notes (Signed)
Patient returned from CT

## 2014-05-16 NOTE — ED Provider Notes (Signed)
CSN: [id number]     Arrival date & time 05/16/14  1430 History   First MD Initiated Contact with Patient 05/16/14 1459     Chief Complaint  Patient presents with  . Flank Pain     (Consider location/radiation/quality/duration/timing/severity/associated sxs/prior Treatment) HPI  73 year old male with history of HLD, acid reflux, arthritis presents for evaluation of flank pain. Patient reports a week ago he did experiencing one single episode of pain to his left flank lasting for about an hour and resolved without any specific treatment. He has been pain-free up until today. States after church he went to ED but during his meal he developed acute onset of sharp pain to his left flank, nonradiating, persistent, nothing to make it better or worse. Pain not improved with positional change. No fever, chills, headache, lightheadedness, dizziness, chest pain or shortness of breath, productive cough, nausea vomiting diarrhea, dysuria, hematuria, or rash. Denies any recent trauma. States that he has history of kidney stones affecting his right side in the past requiring surgery. Pain feels similar to prior kidney stones. Patient received a Percocet approximately 20 minutes ago but it provides no relief. Pain is currently severe.  Past Medical History  Diagnosis Date  . High cholesterol   . Acid reflux   . Depression     " a little"  . Anxiety   . Seasonal allergies   . History of blood transfusion   . Arthritis    Past Surgical History  Procedure Laterality Date  . Gallstone removal    . Cholecystectomy    . Cervical discectomy      x2  . Anterior cervical decomp/discectomy fusion N/A 04/08/2013    Procedure: ANTERIOR CERVICAL DECOMPRESSION/DISCECTOMY FUSION 1 LEVEL;  Surgeon: Floyce Stakes, MD;  Location: [facility];  Service: Neurosurgery;  Laterality: N/A;  Cervical five-six Anterior cervical decompression/diskectomy fusion   No family history on file. History  Substance Use Topics   . Smoking status: Former Research scientist (life sciences)  . Smokeless tobacco: Never Used  . Alcohol Use: No    Review of Systems  All other systems reviewed and are negative.     Allergies  Review of patient's allergies indicates no known allergies.  Home Medications   Prior to Admission medications   Medication Sig Start Date End Date Taking? Authorizing Provider  chlordiazePOXIDE (LIBRIUM) 25 MG capsule Take 25 mg by mouth as needed. For anxiety.    Historical Provider, MD  finasteride (PROSCAR) 5 MG tablet Take 5 mg by mouth at bedtime.     Historical Provider, MD  levocetirizine (XYZAL) 5 MG tablet Take 5 mg by mouth every evening.    Historical Provider, MD  pantoprazole (PROTONIX) 40 MG tablet Take 40 mg by mouth at bedtime.     Historical Provider, MD  rosuvastatin (CRESTOR) 20 MG tablet Take 20 mg by mouth at bedtime.     Historical Provider, MD  tamsulosin (FLOMAX) 0.4 MG CAPS capsule Take 0.4 mg by mouth at bedtime. 02/01/14   Historical Provider, MD   BP 126/76  Pulse 77  Temp(Src) 97.4 F (36.3 C) (Oral)  Resp 24  Ht 5\' 8"  (1.727 m)  Wt 170 lb (77.111 kg)  BMI 25.85 kg/m2  SpO2 100% Physical Exam  Constitutional: He appears well-developed and well-nourished. No distress (Patient appears uncomfortable, writhing in bed).  HENT:  Head: Atraumatic.  Eyes: Conjunctivae are normal.  Neck: Normal range of motion. Neck supple.  Cardiovascular: Normal rate and regular rhythm.   Pulmonary/Chest: Effort  normal and breath sounds normal.  Abdominal: Soft. There is no tenderness.  Genitourinary:  Left CVA tenderness. No overlying skin changes, no crepitus, no emphysema.  Musculoskeletal: He exhibits no edema and no tenderness.  Neurological: He is alert.  Skin: No rash noted.  Psychiatric: He has a normal mood and affect.    ED Course  Procedures (including critical care time)  Patient presents with acute onset of left flank pain concerning for possible kidney stone. He is afebrile with  stable normal vital sign. Pain medication given, workup initiated.  6:06 PM Patient has severe pain requiring multiple doses of pain medications as treatment. He is currently feeling much better with minimal pain. He has no abdominal pain he has no radicular pain and he has no overlying skin changes. Initially his symptoms is suggestive of kidney stones. Abdominal and pelvis CT scan without contrast shows no evidence of kidney stone. No other acute finding. Evidence of diverticulosis without evidence of diverticulitis. There is a mildly enlarged prostate. Patient does have abnormal renal function with a creatinine of 1.7 which he would need to have that rechecked by his primary care Dr.  7:53 PM UA with with trace of Hgb, no evidence of UTI.  At this time i suspect pt may have passed a small kidney stone.  Doubt aortic dissection or other acute emergent condition.  Dr. Roderic Palau has evaluated pt and felt pt stable for discharge.  Return precaution discussed.    Labs Review Labs Reviewed  URINALYSIS, ROUTINE W REFLEX MICROSCOPIC - Abnormal; Notable for the following:    pH 8.5 (*)    Hgb urine dipstick TRACE (*)    Ketones, ur 15 (*)    Protein, ur 30 (*)    All other components within normal limits  I-STAT CHEM 8, ED - Abnormal; Notable for the following:    Creatinine, Ser 1.70 (*)    Glucose, Bld 103 (*)    All other components within normal limits  URINE MICROSCOPIC-ADD ON    Imaging Review Ct Abdomen Pelvis Wo Contrast  05/16/2014   CLINICAL DATA:  Acute onset left abdominal pain.  EXAM: CT ABDOMEN AND PELVIS WITHOUT CONTRAST  TECHNIQUE: Multidetector CT imaging of the abdomen and pelvis was performed following the standard protocol without IV contrast.  COMPARISON:  None.  FINDINGS: No evidence of renal calculi or hydronephrosis. No evidence of ureteral calculi or dilatation. No bladder calculi identified. Mildly enlarged prostate gland noted.  Surgical clips from prior cholecystectomy  noted. No evidence of biliary duct dilatation. Noncontrast images of the liver, pancreas, spleen, and adrenal glands are normal in appearance. No soft tissue masses or lymphadenopathy identified.  Colonic diverticulosis is noted, however there is no evidence of diverticulitis. No other inflammatory process or abnormal fluid collections identified. Normal appendix visualized. No evidence of dilated bowel loops. No suspicious bone lesions identified.  IMPRESSION: No acute findings.  Diverticulosis. No radiographic evidence of diverticulitis.  Mildly enlarged prostate.   Electronically Signed   By: Earle Gell M.D.   On: 05/16/2014 17:22     EKG Interpretation None      MDM   Final diagnoses:  Acute left flank pain    BP 127/82  Pulse 65  Temp(Src) 97.4 F (36.3 C) (Oral)  Resp 13  Ht 5\' 8"  (1.727 m)  Wt 170 lb (77.111 kg)  BMI 25.85 kg/m2  SpO2 92%  I have reviewed nursing notes and vital signs. I personally reviewed the imaging tests through PACS system  I reviewed available ER/hospitalization records thought the EMR     Domenic Moras, [name] 05/16/14 1956

## 2014-05-16 NOTE — Discharge Instructions (Signed)
You may have recently passed a small kidney stone, which may account for your pain.  Take pain medication as needed. Follow up with your doctor for further care.  Return to ER if your symptoms worsen or if you have other concerns.    Flank Pain Flank pain is pain in your side. The flank is the area of your side between your upper belly (abdomen) and your back. Pain in this area can be caused by many different things. [place] care and treatment will depend on the cause of your pain.  Rest as told by your doctor.  Drink enough fluids to keep your pee (urine) clear or pale yellow.  Only take medicine as told by your doctor.  Tell your doctor about any changes in your pain.  Follow up with your doctor. GET HELP RIGHT AWAY IF:   Your pain does not get better with medicine.   You have new symptoms or your symptoms get worse.  Your pain gets worse.   You have belly (abdominal) pain.   You are short of breath.   You always feel sick to your stomach (nauseous).   You keep throwing up (vomiting).   You have puffiness (swelling) in your belly.   You feel lightheaded or you pass out (faint).   You have blood in your pee.  You have a fever or lasting symptoms for more than 2-3 days.  You have a fever and your symptoms suddenly get worse. MAKE SURE YOU:   Understand these instructions.  Will watch your condition.  Will get help right away if you are not doing well or get worse. Document Released: 08/07/2008 Document Revised: 07/23/2012 Document Reviewed: 06/12/2012 [facility] Patient Information 2015 [place], [place]. This information is not intended to replace advice given to you by your health care provider. Make sure you discuss any questions you have with your health care provider.

## 2014-05-16 NOTE — ED Notes (Addendum)
Pt c/o left side pain onset x 1 week. Pt reports that he was out eating at [employer]. Today pain occurred again while out eating. Pt reports shortness of breath with pain. Pt denies injury but has been doing a lot of bending. Pt denies coughing. Reports slow urination.

## 2014-05-16 NOTE — ED Notes (Signed)
Patient transported to CT 

## 2014-05-17 NOTE — ED Provider Notes (Signed)
Medical screening examination/treatment/procedure(s) were conducted as a shared visit with non-physician practitioner(s) and myself.  I personally evaluated the patient during the encounter.   EKG Interpretation None      Pt with right flank pain.  pe tender mild right flank  Maudry Diego, MD 05/17/14 1444

## 2014-06-23 ENCOUNTER — Other Ambulatory Visit: Payer: Self-pay | Admitting: Family Medicine

## 2014-06-23 DIAGNOSIS — R079 Chest pain, unspecified: Secondary | ICD-10-CM

## 2014-06-25 ENCOUNTER — Other Ambulatory Visit: Payer: 59

## 2014-07-12 ENCOUNTER — Ambulatory Visit
Admission: RE | Admit: 2014-07-12 | Discharge: 2014-07-12 | Disposition: A | Discharge status: Home / Self Care | Payer: Medicare Other | Source: Ambulatory Visit | Attending: Family Medicine | Admitting: Family Medicine

## 2014-07-12 ENCOUNTER — Other Ambulatory Visit: Payer: Self-pay | Admitting: Family Medicine

## 2014-07-12 ENCOUNTER — Emergency Department (HOSPITAL_COMMUNITY)
Admission: EM | Admit: 2014-07-12 | Discharge: 2014-07-12 | Disposition: A | Discharge status: Home / Self Care | Payer: 59 | Attending: Emergency Medicine | Admitting: Emergency Medicine

## 2014-07-12 ENCOUNTER — Encounter (HOSPITAL_COMMUNITY): Payer: Self-pay | Admitting: Emergency Medicine

## 2014-07-12 DIAGNOSIS — R079 Chest pain, unspecified: Secondary | ICD-10-CM

## 2014-07-12 DIAGNOSIS — Z8659 Personal history of other mental and behavioral disorders: Secondary | ICD-10-CM | POA: Diagnosis not present

## 2014-07-12 DIAGNOSIS — Y9289 Other specified places as the place of occurrence of the external cause: Secondary | ICD-10-CM | POA: Diagnosis not present

## 2014-07-12 DIAGNOSIS — K219 Gastro-esophageal reflux disease without esophagitis: Secondary | ICD-10-CM | POA: Diagnosis not present

## 2014-07-12 DIAGNOSIS — E7800 Pure hypercholesterolemia, unspecified: Secondary | ICD-10-CM | POA: Insufficient documentation

## 2014-07-12 DIAGNOSIS — T63441A Toxic effect of venom of bees, accidental (unintentional), initial encounter: Secondary | ICD-10-CM

## 2014-07-12 DIAGNOSIS — Y9389 Activity, other specified: Secondary | ICD-10-CM | POA: Diagnosis not present

## 2014-07-12 DIAGNOSIS — T63461A Toxic effect of venom of wasps, accidental (unintentional), initial encounter: Secondary | ICD-10-CM | POA: Diagnosis not present

## 2014-07-12 DIAGNOSIS — Z87891 Personal history of nicotine dependence: Secondary | ICD-10-CM | POA: Insufficient documentation

## 2014-07-12 DIAGNOSIS — M129 Arthropathy, unspecified: Secondary | ICD-10-CM | POA: Insufficient documentation

## 2014-07-12 DIAGNOSIS — T6391XA Toxic effect of contact with unspecified venomous animal, accidental (unintentional), initial encounter: Secondary | ICD-10-CM | POA: Insufficient documentation

## 2014-07-12 MED ORDER — PREDNISONE 20 MG PO TABS: 40.0000 mg | ORAL_TABLET | Freq: Every day | ORAL | Status: DC

## 2014-07-12 MED ORDER — PREDNISONE 20 MG PO TABS
60.0000 mg | ORAL_TABLET | Freq: Once | ORAL | Status: AC
  Administered 2014-07-12: 60 mg via ORAL
  Filled 2014-07-12: qty 3

## 2014-07-12 NOTE — ED Notes (Signed)
Refused wheelchair 

## 2014-07-12 NOTE — ED Provider Notes (Signed)
  Medical screening examination/treatment/procedure(s) were performed by non-physician practitioner and as supervising physician I was immediately available for consultation/collaboration.   EKG Interpretation None         Carmin Muskrat, MD 07/12/14 2332

## 2014-07-12 NOTE — Discharge Instructions (Signed)
Return to the emergency room with worsening of symptoms, new symptoms or with symptoms that are concerning, especially increased swelling, difficulty seeing, difficulty breathing, difficulty swallowing, lightheadedness or fainting. Please call the number below under resources to establish care with a primary care provider. Then call your doctor for a follow up appointment within 24 to 48 hours and please let them know you were seen in the Emergency Department. Have them acquire all of your records so that they can discuss the findings with you and formulate a treatment plan to fully care for your new and ongoing medical problems.  Anaphylactic Reaction An anaphylactic reaction is a sudden, severe allergic reaction that involves the whole body. It can be life threatening. A hospital stay is often required. People with asthma, eczema, or hay fever are slightly more likely to have an anaphylactic reaction. CAUSES  An anaphylactic reaction may be caused by anything to which you are allergic. After being exposed to the allergic substance, your immune system becomes sensitized to it. When you are exposed to that allergic substance again, an allergic reaction can occur. Common causes of an anaphylactic reaction include:  Medicines.  Foods, especially peanuts, wheat, shellfish, milk, and eggs.  Insect bites or stings.  Blood products.  Chemicals, such as dyes, latex, and contrast material used for imaging tests. SYMPTOMS  When an allergic reaction occurs, the body releases histamine and other substances. These substances cause symptoms such as tightening of the airway. Symptoms often develop within seconds or minutes of exposure. Symptoms may include:  Skin rash or hives.  Itching.  Chest tightness.  Swelling of the eyes, tongue, or lips.  Trouble breathing or swallowing.  Lightheadedness or fainting.  Anxiety or confusion.  Stomach pains, vomiting, or diarrhea.  Nasal congestion.  A  fast or irregular heartbeat (palpitations). DIAGNOSIS  Diagnosis is based on your history of recent exposure to allergic substances, your symptoms, and a physical exam. Your caregiver may also perform blood or urine tests to confirm the diagnosis. TREATMENT  Epinephrine medicine is the main treatment for an anaphylactic reaction. Other medicines that may be used for treatment include antihistamines, steroids, and albuterol. In severe cases, fluids and medicine to support blood pressure may be given through an intravenous line (IV). Even if you improve after treatment, you need to be observed to make sure your condition does not get worse. This may require a stay in the hospital. [place] a medical alert bracelet or necklace stating your allergy.  You and your family must learn how to use an anaphylaxis kit or give an epinephrine injection to temporarily treat an emergency allergic reaction. Always carry your epinephrine injection or anaphylaxis kit with you. This can be lifesaving if you have a severe reaction.  Do not drive or perform tasks after treatment until the medicines used to treat your reaction have worn off, or until your caregiver says it is okay.  If you have hives or a rash:  Take medicines as directed by your caregiver.  You may use an over-the-counter antihistamine (diphenhydramine) as needed.  Apply cold compresses to the skin or take baths in cool water. Avoid hot baths or showers. SEEK MEDICAL CARE IF:   You develop symptoms of an allergic reaction to a new substance. Symptoms may start right away or minutes later.  You develop a rash, hives, or itching.  You develop new symptoms. SEEK IMMEDIATE MEDICAL CARE IF:   You have swelling of the mouth, difficulty breathing,  or wheezing.  You have a tight feeling in your chest or throat.  You develop hives, swelling, or itching all over your body.  You develop severe vomiting or diarrhea.  You feel  faint or pass out. This is an emergency. Use your epinephrine injection or anaphylaxis kit as you have been instructed. Call your local emergency services (911 in U.S.). Even if you improve after the injection, you need to be examined at a hospital emergency department. MAKE SURE YOU:   Understand these instructions.  Will watch your condition.  Will get help right away if you are not doing well or get worse. Document Released: 10/29/2005 Document Revised: 11/03/2013 Document Reviewed: 01/30/2012 [facility] Patient Information 2015 [place], [place]. This information is not intended to replace advice given to you by your health care provider. Make sure you discuss any questions you have with your health care provider.   Emergency Department Resource Guide 1) Find a Doctor and Pay Out of Pocket Although you won't have to find out who is covered by your insurance plan, it is a good idea to ask around and get recommendations. You will then need to call the office and see if the doctor you have chosen will accept you as a new patient and what types of options they offer for patients who are self-pay. Some doctors offer discounts or will set up payment plans for their patients who do not have insurance, but you will need to ask so you aren't surprised when you get to your appointment.  2) Contact Your Local Health Department Not all health departments have doctors that can see patients for sick visits, but many do, so it is worth a call to see if yours does. If you don't know where your local health department is, you can check in your phone book. The CDC also has a tool to help you locate your state's health department, and many state websites also have listings of all of their local health departments.  3) Find a [facility] If your illness is not likely to be very severe or complicated, you may want to try a walk in clinic. These are popping up all over the country in pharmacies, drugstores, and  shopping centers. They're usually staffed by nurse practitioners or physician assistants that have been trained to treat common illnesses and complaints. They're usually fairly quick and inexpensive. However, if you have serious medical issues or chronic medical problems, these are probably not your best option.  No Primary Care Doctor: - Call Health Connect at  [phone] - they can help you locate a primary care doctor that  accepts your insurance, provides certain services, etc. - Physician Referral Service- [phone]  Chronic Pain Problems: Organization         Address  Phone   Notes  [facility]  [phone] Patients need to be referred by their primary care doctor.   Medication Assistance: Organization         Address  Phone   Notes  [facility] Medication [facility] [place] [place], [place] [address] [phone] --Must be a resident of [facility] -- Must have NO insurance coverage whatsoever (no Medicaid/ Medicare, etc.) -- The pt. MUST have a primary care doctor that directs their care regularly and follows them in the community   Med[facility]ist  [phone]   [employer]  [phone]    Agencies that provide inexpensive medical care: Organization  Address  Phone   Notes  [place]  [phone]   [facility]    [phone]   [facility] [place], [place] [address] [phone]   [place] [address] [place] [place] [phone]   Planned Parenthood    [phone]   [facility]    [phone]   [place] and [place] [address], [place] Phone:  [phone], Fax:  [phone] Hours of Operation:  9 am - 6 pm, M-F.  Also accepts Medicaid/Medicare and self-pay.  [facility] for [place], [address], [place] Phone: [phone],  Fax: [phone]. Hours of Operation:  8:30 am - 5:30 pm, M-F.  Also accepts Medicaid and self-pay.  [facility] [place] [address] [place] Phone: [phone]   [place], [place], [place] [phone], Ext. [phone] Mondays & Thursdays: 7-9 AM.  First 15 patients are seen on a first come, first serve basis.    [place] Providers:  Organization         Address  Phone   Notes  [facility] [address], [place] [phone] Also accepts self-pay patients.  [facility] [address], [place]  [phone]   [place], [address], [place] [phone]   [facility] [address] [place] [phone]   Lucianne Lei [address], [place]   [phone] Only accepts [place] Access [place] patients after they have their name applied to their card.   Self-Pay (no insurance) in [facility]:  Organization         Address  Phone   Notes  Sickle Cell Patients, [facility] [place] [phone]   [facility] [place] [phone]   [facility] [place]  [place], [place], [place] [phone]   [facility]/Dr. Osei-Bonsu  [address], [place] or [address], [place] [phone] Phone number for both [place] and [place] locations is the same.  Urgent [facility] and [facility] [address] [place] [phone]   [facility] [address], [place] or [address] [phone]   [facility] [address], [place] [phone], phone; [phone], fax Sees patients 1st and 3rd Saturday of every month.  Must not qualify for public or private  insurance (i.e. Medicaid, Medicare, [place] Health Choice, Veterans' Benefits)  Household income should be no more than 200% of the poverty level The clinic cannot treat you if you are pregnant or think you are pregnant  Sexually transmitted diseases are not treated at the clinic.    Dental Care: Organization         Address  Phone  Notes  [facility] [place] [phone] Accepts children up to age 26 who are enrolled in [place] or [place]; pregnant women with a Medicaid card; and children who have applied for Medicaid or [place] Health Choice, but were declined, whose parents can pay a reduced fee at time  of service.  [facility] Department of [facility]  [address], [place] [phone] Accepts children up to age 25 who are enrolled in [place] or [place]; pregnant women with a Medicaid card; and children who have applied for Medicaid or [place] Health Choice, but were declined, whose parents can pay a reduced fee at time of service.  [place] Adult Dental Access PROGRAM  [place] [phone] Patients are seen by appointment only. Walk-ins are not accepted. [place] will see patients 40 years of age and older. Monday - Tuesday (8am-5pm) Most Wednesdays (8:30-5pm) $30 per visit, cash only  [facility] Adult Dental Access PROGRAM  [address], [facility] [phone] Patients are seen by appointment only. Walk-ins are not accepted. [place] will see patients 70 years of age and older. One Wednesday Evening (Monthly: Volunteer Based).  $30 per visit, cash only  [place]  [phone] for adults; Children under age 8, call Graduate Pediatric Dentistry at [phone]. Children aged 31-14, please call [phone] to request a pediatric application.  Dental services are provided in all areas of dental care  including fillings, crowns and bridges, complete and partial dentures, implants, gum treatment, root canals, and extractions. Preventive care is also provided. Treatment is provided to both adults and children. Patients are selected via a lottery and there is often a waiting list.   [facility] [address] [place]  [phone] [email or web address]   [facility] [address] [place], [place] [phone], Ext. [phone] Second and Fourth Thursday of each month, opens at 6:30 AM; Clinic ends at 9 AM.  Patients are seen on a first-come first-served basis, and a limited number are seen during each clinic.   [facility]  [address] Hillard Danker [place], [place] [phone]   Eligibility Requirements You must have lived in [place], [place], or [place] counties for at least the last three months.   You cannot be eligible for state or federal sponsored [employer], including [employer], [place], or [employer].   You generally cannot be eligible for healthcare insurance through your employer.    How to apply: Eligibility screenings are held every Tuesday and Wednesday afternoon from 1:00 pm until 4:00 pm. You do not need an appointment for the interview!  [facility] [address] [place], [place]   [place]  [place] Department  [place]  [phone]    Behavioral Health Resources in the Community: Intensive Outpatient Programs Organization         Address  Phone  Notes  Miller Brunson. [address] [place], [place] [phone]   [facility] [address], [place], [place]   ADS: Alcohol & Drug S[facility]cs [address], [place], [place]   [place] [address]  [place], [place] or [phone]     Substance Abuse Resources Organization         Address  Phone  Notes  Alcohol and Drug Services  [phone]   [place]  [phone]   [facility] [place]   Chinita Pester  [phone]   Residential & Outpatient Substance Abuse Program  [phone]   Psychological Services Organization         Address  Phone  Notes  [facility] [place]  [place]  [phone]   [place] [address] [place] or [phone]    Mobile Crisis Teams Organization         Address  Phone  Notes  [facility], Mobile Crisis Care Unit  [phone]   [facility]  [address] [place], [place]   [place] Levels [address] [place] [phone]    Self-Help/Support Groups Organization         Address  Phone             Notes  [place] of [place] - variety of support groups  Emerald Call for more information  Narcotics Anonymous (NA), [facility] [address], [employer] [place]  2 meetings at this location   Special educational needs teacher         Address  Phone  Notes  [facility] [place],    [place]  [phone]   [facility]  [address], [place] [phone], [place], [place]   [place], [place] [phone] Admissions: 8am-3pm M-F  Incentives Substance [place] [address],    [place], [place] [phone]   [facility] [address] [place], [place], [place]   [facility] [address]  [place], [place]   [facility] - Intensive Outpatient [address] Krist[address], [place], [place]   [facility] ([place].) [place].,  [place], [place] [phone] or [phone]   Residential Treatment  Services (RTS) [address] [place], [place] Accepts Medicaid  [facility] [place] [address],  [place] [phone] Substance Abuse/Addiction Treatment   [facility] Organization         Address  Phone  Notes  [facility]  [phone]   Domenic Schwab, PhD [address] Arlis Porta [place], [place]   [phone] or [phone]   [place] [facility] [place], [place] [phone]   [facility] [address] [place], [place] [phone] Insurance/Medicaid/sponsorship through [facility] and Families [address]                                    [place], [place] [phone] [place] [address] [place], [place] [phone]    Dr. Adele Schilder  [phone]   [facility] Dept. 1) [address], [place] 2) [place] 3)  [place] [address], [place] [phone]  [phone]   [place] [phone] or [phone] (After Hours)

## 2014-07-12 NOTE — ED Notes (Signed)
Pt reports he was outside today and stung by a bee. Has swelling under right eye. No respiratory issues. Denies vision changes. Pt is a x 4. In NAD. No prior allergric rx to bee stings

## 2014-07-12 NOTE — ED Provider Notes (Signed)
CSN: [id number]     Arrival date & time 07/12/14  1622 History  This chart was scribed for non-physician practitioner, Pura Spice, PA-C working with Carmin Muskrat, MD by Frederich Balding, ED scribe. This patient was seen in room TR05C/TR05C and the patient's care was started at 4:40 PM.   Chief Complaint  Patient presents with  . Insect Bite   The history is provided by the patient. No language interpreter was used.   HPI Comments: Randy Merritt is a 73 y.o. male who presents to the Emergency Department complaining of a bee sting under his right eye that occurred around 9 AM today. Pt reports gradual onset swelling under his eye. Denies any itching. Pt has used ice over the area with little relief. He wears glasses but no contacts. Denies mouth itching, mouth paresthesias, trouble swallowing, eye pain, blurry vision, double vision, decreased vision, chest pain, SOB, difficulty breathing, abdominal pain, headaches, weakness, numbness or tingling. Denies history of reactions to bee stings. Patient denies lightheadedness or presyncope or HTN.  Past Medical History  Diagnosis Date  . High cholesterol   . Acid reflux   . Depression     " a little"  . Anxiety   . Seasonal allergies   . History of blood transfusion   . Arthritis    Past Surgical History  Procedure Laterality Date  . Gallstone removal    . Cholecystectomy    . Cervical discectomy      x2  . Anterior cervical decomp/discectomy fusion N/A 04/08/2013    Procedure: ANTERIOR CERVICAL DECOMPRESSION/DISCECTOMY FUSION 1 LEVEL;  Surgeon: Floyce Stakes, MD;  Location: [facility];  Service: Neurosurgery;  Laterality: N/A;  Cervical five-six Anterior cervical decompression/diskectomy fusion   No family history on file. History  Substance Use Topics  . Smoking status: Former Research scientist (life sciences)  . Smokeless tobacco: Never Used  . Alcohol Use: No    Review of Systems  HENT: Positive for facial swelling. Negative for trouble  swallowing.   Eyes: Negative for pain and visual disturbance.  Respiratory: Negative for shortness of breath.   Cardiovascular: Negative for chest pain.  Gastrointestinal: Negative for abdominal pain.  Neurological: Negative for weakness, numbness and headaches.  All other systems reviewed and are negative.  Allergies  Review of patient's allergies indicates no known allergies.  Home Medications   Prior to Admission medications   Medication Sig Start Date End Date Taking? Authorizing Provider  chlordiazePOXIDE (LIBRIUM) 25 MG capsule Take 25 mg by mouth as needed. For anxiety.    Historical Provider, MD  finasteride (PROSCAR) 5 MG tablet Take 5 mg by mouth at bedtime.     Historical Provider, MD  levocetirizine (XYZAL) 5 MG tablet Take 5 mg by mouth every evening.    Historical Provider, MD  oxyCODONE-acetaminophen (PERCOCET/ROXICET) 5-325 MG per tablet Take 1 tablet by mouth every 6 (six) hours as needed for severe pain. 05/16/14   Domenic Moras, PA-C  pantoprazole (PROTONIX) 40 MG tablet Take 40 mg by mouth at bedtime.     Historical Provider, MD  predniSONE (DELTASONE) 20 MG tablet Take 2 tablets (40 mg total) by mouth daily. 07/12/14   Pura Spice, PA-C  rosuvastatin (CRESTOR) 20 MG tablet Take 20 mg by mouth at bedtime.     Historical Provider, MD  tamsulosin (FLOMAX) 0.4 MG CAPS capsule Take 0.4 mg by mouth at bedtime. 02/01/14   Historical Provider, MD   BP 127/82  Pulse 85  Temp(Src) 97.8 F (36.6  C) (Oral)  Resp 18  Wt 167 lb 3 oz (75.836 kg)  SpO2 100%  Physical Exam  Nursing note and vitals reviewed. Constitutional: He is oriented to person, place, and time. He appears well-developed and well-nourished. No distress.  HENT:  Head: Normocephalic and atraumatic.  Nose: Right sinus exhibits no maxillary sinus tenderness and no frontal sinus tenderness. Left sinus exhibits no maxillary sinus tenderness and no frontal sinus tenderness.  Mouth/Throat: Uvula is midline,  oropharynx is clear and moist and mucous membranes are normal.  Mild swelling and erythema under right eye. No oral or lip swelling. No eye injection or pain. No orbital tenderness. Visual acuity: Bilateral near:20 Bilateral Distance:30 R Near:20 R Distance:40  L Near: 20 L Distance: 25  Eyes: Conjunctivae and EOM are normal.  Neck: Neck supple. No tracheal deviation present.  Cardiovascular: Normal rate, regular rhythm and normal heart sounds.   Pulmonary/Chest: Effort normal and breath sounds normal. No stridor. No respiratory distress. He has no wheezes. He has no rhonchi. He has no rales.  Musculoskeletal: Normal range of motion.  Lymphadenopathy:    He has no cervical adenopathy.  Neurological: He is alert and oriented to person, place, and time.  Skin: Skin is warm and dry.  Psychiatric: He has a normal mood and affect. His behavior is normal.    ED Course  Procedures (including critical care time)  DIAGNOSTIC STUDIES: Oxygen Saturation is 100% on RA, normal by my interpretation.    COORDINATION OF CARE: 4:45 PM-Discussed treatment plan which includes benadryl and pepcid with pt at bedside and pt agreed to plan.   Labs Review Labs Reviewed - No data to display  Imaging Review Dg Chest 2 View  07/12/2014   CLINICAL DATA:  Chest pain  EXAM: CHEST  2 VIEW  COMPARISON:  02/10/2014  FINDINGS: Normal heart size and mediastinal contours. No acute infiltrate or edema. No effusion or pneumothorax. Diffuse thoracic spondylotic spurring. No acute osseous findings. Cholecystectomy changes.  IMPRESSION: No active cardiopulmonary disease.   Electronically Signed   By: Jorje Guild M.D.   On: 07/12/2014 16:04     EKG Interpretation None     Meds given in ED:  Medications  predniSONE (DELTASONE) tablet 60 mg (60 mg Oral Given 07/12/14 1704)    New Prescriptions   PREDNISONE (DELTASONE) 20 MG TABLET    Take 2 tablets (40 mg total) by mouth daily.      MDM   Final  diagnoses:  Bee sting, accidental or unintentional, initial encounter   Patient 73 year old patient presents after bee sting to right lower eyelid this morning. He has localized swelling and erythema but denies difficulty swallowing, breathing, presyncope or lightheadedness. No lip involvement. Patient endorses eye tightness but no pain or visual changes. Patient has had previous bee stings without anaphylaxis like reaction. VSS. Patient CXR without active cardiopulmonary disease. I doubt anaphylactic reaction due to patient's nontoxic appearance, localized symptoms and no respiratory distress. Patient given 60mg  of prednisone in ED and 5 day course of prednisone for home. Patient encouraged to establish care with PCP. Resources provided.  Discussed return precautions with patient. Discussed all results and patient verbalizes understanding and agrees with plan.  I personally performed the services described in this documentation, which was scribed in my presence. The recorded information has been reviewed and is accurate.  Pura Spice, PA-C 07/12/14 [phone]

## 2014-07-14 ENCOUNTER — Other Ambulatory Visit (HOSPITAL_COMMUNITY): Payer: Self-pay | Admitting: Cardiology

## 2014-07-14 DIAGNOSIS — R079 Chest pain, unspecified: Secondary | ICD-10-CM

## 2014-07-15 ENCOUNTER — Other Ambulatory Visit: Payer: Self-pay | Admitting: Family Medicine

## 2014-07-15 DIAGNOSIS — M545 Low back pain, unspecified: Secondary | ICD-10-CM

## 2014-07-21 ENCOUNTER — Encounter (HOSPITAL_COMMUNITY)
Admission: RE | Admit: 2014-07-21 | Discharge: 2014-07-21 | Disposition: A | Discharge status: Home / Self Care | Payer: 59 | Source: Ambulatory Visit | Attending: Cardiology | Admitting: Cardiology

## 2014-07-21 DIAGNOSIS — R079 Chest pain, unspecified: Secondary | ICD-10-CM | POA: Insufficient documentation

## 2014-07-21 MED ORDER — REGADENOSON 0.4 MG/5ML IV SOLN
0.4000 mg | Freq: Once | INTRAVENOUS | Status: AC
  Administered 2014-07-21: 0.4 mg via INTRAVENOUS

## 2014-07-21 MED ORDER — TECHNETIUM TC 99M SESTAMIBI GENERIC - CARDIOLITE
30.0000 | Freq: Once | INTRAVENOUS | Status: AC | PRN
  Administered 2014-07-21: 30 via INTRAVENOUS

## 2014-07-21 MED ORDER — TECHNETIUM TC 99M SESTAMIBI GENERIC - CARDIOLITE
10.0000 | Freq: Once | INTRAVENOUS | Status: AC | PRN
  Administered 2014-07-21: 10 via INTRAVENOUS

## 2014-07-21 MED ORDER — REGADENOSON 0.4 MG/5ML IV SOLN
INTRAVENOUS | Status: AC
  Filled 2014-07-21: qty 5

## 2014-07-22 ENCOUNTER — Ambulatory Visit
Admission: RE | Admit: 2014-07-22 | Discharge: 2014-07-22 | Disposition: A | Discharge status: Home / Self Care | Payer: 59 | Source: Ambulatory Visit | Attending: Family Medicine | Admitting: Family Medicine

## 2014-07-22 DIAGNOSIS — M545 Low back pain, unspecified: Secondary | ICD-10-CM

## 2014-10-12 ENCOUNTER — Ambulatory Visit: Payer: Self-pay | Admitting: Podiatry

## 2014-10-19 ENCOUNTER — Ambulatory Visit: Payer: Self-pay | Admitting: Podiatry

## 2014-10-23 ENCOUNTER — Encounter: Payer: Self-pay | Admitting: Podiatry

## 2014-10-23 ENCOUNTER — Ambulatory Visit (INDEPENDENT_AMBULATORY_CARE_PROVIDER_SITE_OTHER): Payer: 59 | Admitting: Podiatry

## 2014-10-23 VITALS — BP 145/86 | HR 70 | Resp 16

## 2014-10-23 DIAGNOSIS — L03011 Cellulitis of right finger: Secondary | ICD-10-CM

## 2014-10-23 DIAGNOSIS — L600 Ingrowing nail: Secondary | ICD-10-CM

## 2014-10-23 MED ORDER — CEPHALEXIN 500 MG PO CAPS: 500.0000 mg | ORAL_CAPSULE | Freq: Two times a day (BID) | ORAL | Status: DC

## 2014-10-23 NOTE — Patient Instructions (Signed)

## 2014-10-23 NOTE — Progress Notes (Signed)
   Subjective:    Patient ID: Dervin Vore, male    DOB: [birth year], 73 y.o.   MRN: [id number]  HPI Comments: "I have something with this toe"  73 year old male presents the office today with complaints of a painful ingrown toenail on the medial border of the right hallux. He states the area has been painful for a few months. There is been some redness and swelling along the base of the toenail. He states that certain shoe gear makes it worse with pressure. He's been applying Neosporin as well as soaking his foot without any resolution of symptoms. He denies any purulence or drainage. No streaking. No other complaints at this time.  Toe Pain       Review of Systems  All other systems reviewed and are negative.      Objective:   Physical Exam AAO x3, NAD DP/PT pulses palpable bilaterally, CRT less than 3 seconds Protective sensation intact with Simms Weinstein monofilament, vibratory sensation intact, Achilles tendon reflex intact Tenderness palpation overlying the medial aspect of the right hallux toenail. There is evidence of incurvation along the medial aspect nail border. There is a small amount of drainage identified in the proximal nail border. There is mild edema around the proximal medial nail border. No ascending cellulitis however there is slight localized edema. Tenderness directly overlying the medial nail border. No other areas of tenderness in the remaining nails without pathology. No open lesions or pre-ulcerative lesions. MMT 5/5, ROM WNL No pain with calf compression, swelling, warmth, erythema.     Assessment & Plan:  73 year old male with symptomatic right medial hallux ingrown toenail/paronychia -Treatment options were discussed including alternatives, risks, complications -At this time due to discomfort as well as mild infection recommended partial nail avulsion without chemical matricectomy. Risks, complications of the procedure were discussed with the patient  for which she understood and verbally consented of the procedure. Patient's wife is also present. Under sterile conditions a total of 2.5 mL of a one-to-one mixture of 2% lidocaine plain and 0.5% Marcaine plain was infiltrated in a hallux block fashion on the right foot. Once anesthetized the skin was then prepped in sterile fashion. Tourniquet was then applied. Next the medial aspect the right hallux toenail was sharply excised making sure to remove the entire offending nail border. There is found to be a significant amount ingrowing along the proximal medial aspect of the nail border. No purulence was identified however small amount of serous drainage was noted. Once the nail was removed area was debrided and the underlying skin was intact. Area was then copiously irrigated. Silvadene was applied followed by dry sterile dressing. After application of the dressing the tourniquet was removed and there is found to be an immediate capillary refill time to the digit. Patient tolerated the procedure well without any Complications. Post procedure directions were discussed the patient for which he verbally understood. Prescribed Keflex. Monitor for any clinical signs or symptoms of worsening infection and directed to call the office immediately should any occur or go to the emergency room. -Follow-up in one week for nail check. In the meantime, call the office with any questions, concerns, change in symptoms.

## 2014-10-27 ENCOUNTER — Ambulatory Visit: Payer: 59 | Admitting: Podiatry

## 2014-10-29 ENCOUNTER — Ambulatory Visit: Payer: 59 | Admitting: Podiatry

## 2014-10-29 ENCOUNTER — Ambulatory Visit (INDEPENDENT_AMBULATORY_CARE_PROVIDER_SITE_OTHER): Payer: 59 | Admitting: Podiatry

## 2014-10-29 ENCOUNTER — Encounter: Payer: Self-pay | Admitting: Podiatry

## 2014-10-29 VITALS — BP 139/90 | HR 76 | Resp 12

## 2014-10-29 DIAGNOSIS — L600 Ingrowing nail: Secondary | ICD-10-CM

## 2014-10-29 DIAGNOSIS — L03011 Cellulitis of right finger: Secondary | ICD-10-CM

## 2014-10-29 NOTE — Patient Instructions (Signed)
Continue to soak twice a day in epsom salts followed by antibiotic ointment and a band-aid. Can leave uncovered at night.  If there area has not completely healed in 2 weeks, or if symptoms worsen, call the office to make an appointment.  Monitor for any signs/symptoms of infection. Call the office immediately if any occur or go directly to the emergency room. Call with any questions/concerns.

## 2014-11-01 ENCOUNTER — Encounter: Payer: Self-pay | Admitting: Podiatry

## 2014-11-01 NOTE — Progress Notes (Signed)
Patient ID: Randy Merritt, male   DOB: 1941-07-29, 73 y.o.   MRN: [id number]  Subjective: 73 year old male returns the office today one-week status post right medial hallux partial nail avulsion secondary to paronychia. Patient states he is continue soaking the foot twice a day Epson salts covering with interbody ointment and a Band-Aid. Denies any purulence from the area. He states that he gets some intermittent discomfort to the area at night however his pain is mostly controlled. Denies any systemic complaints as fevers, chills, nausea, vomiting. No other complaints at this time and no acute changes since last appointment.  Objective: AAO 3, NAD DP/PT pulses palpable, CRT less than 3 seconds Protective sensation intact with Simms Weinstein monofilament, vibratory sensation intact Right medial hallux status post partial nail avulsion which is healing well for this timeframe. There is a small amount of granulation tissue within the procedure site. There is no drainage or purulence identified. No swelling erythema, ascending cellulitis. No areas of fluctuance or crepitus or malodor. No tenderness over the site of this time. No pain with calf compression, swelling, warmth, erythema. No other open lesions identified at this time.  Assessment: 73 year old male one-week says his right medial hallux partial nail avulsion secondary to paronychia  Plan: -Treatment options were discussed the patient including alternatives, risks, complications. -Recommended to continue soaking in Epson salts twice a day followed by antibiotic ointment and a Band-Aid. Continue this until the area has completely healed. Can leave the area uncovered at night. Continue to monitor for any clinical signs or symptoms of infection and directed to call the office immediately should any occur ago directly to the emergency room. -Follow-up in 2 weeks if the area has not completely healed or sooner if any problems are to arise. In the  meantime, call the office with any questions, concerns, change in symptoms.

## 2015-03-23 ENCOUNTER — Emergency Department (HOSPITAL_COMMUNITY)
Admission: EM | Admit: 2015-03-23 | Discharge: 2015-03-23 | Disposition: A | Discharge status: Home / Self Care | Payer: 59 | Attending: Emergency Medicine | Admitting: Emergency Medicine

## 2015-03-23 ENCOUNTER — Encounter (HOSPITAL_COMMUNITY): Payer: Self-pay | Admitting: Family Medicine

## 2015-03-23 DIAGNOSIS — S199XXA Unspecified injury of neck, initial encounter: Secondary | ICD-10-CM | POA: Diagnosis not present

## 2015-03-23 DIAGNOSIS — Z8659 Personal history of other mental and behavioral disorders: Secondary | ICD-10-CM | POA: Diagnosis not present

## 2015-03-23 DIAGNOSIS — Y92410 Unspecified street and highway as the place of occurrence of the external cause: Secondary | ICD-10-CM | POA: Insufficient documentation

## 2015-03-23 DIAGNOSIS — Y9389 Activity, other specified: Secondary | ICD-10-CM | POA: Diagnosis not present

## 2015-03-23 DIAGNOSIS — K219 Gastro-esophageal reflux disease without esophagitis: Secondary | ICD-10-CM | POA: Insufficient documentation

## 2015-03-23 DIAGNOSIS — Z79899 Other long term (current) drug therapy: Secondary | ICD-10-CM | POA: Insufficient documentation

## 2015-03-23 DIAGNOSIS — E780 Pure hypercholesterolemia: Secondary | ICD-10-CM | POA: Insufficient documentation

## 2015-03-23 DIAGNOSIS — Y998 Other external cause status: Secondary | ICD-10-CM | POA: Diagnosis not present

## 2015-03-23 DIAGNOSIS — Z8739 Personal history of other diseases of the musculoskeletal system and connective tissue: Secondary | ICD-10-CM | POA: Insufficient documentation

## 2015-03-23 DIAGNOSIS — Z87891 Personal history of nicotine dependence: Secondary | ICD-10-CM | POA: Insufficient documentation

## 2015-03-23 DIAGNOSIS — Z792 Long term (current) use of antibiotics: Secondary | ICD-10-CM | POA: Diagnosis not present

## 2015-03-23 MED ORDER — HYDROCODONE-ACETAMINOPHEN 5-325 MG PO TABS
2.0000 | ORAL_TABLET | Freq: Once | ORAL | Status: AC
  Administered 2015-03-23: 2 via ORAL
  Filled 2015-03-23: qty 2

## 2015-03-23 MED ORDER — HYDROCODONE-ACETAMINOPHEN 5-325 MG PO TABS: 1.0000 | ORAL_TABLET | Freq: Four times a day (QID) | ORAL | Status: DC | PRN

## 2015-03-23 MED ORDER — DOCUSATE SODIUM 100 MG PO CAPS: 100.0000 mg | ORAL_CAPSULE | Freq: Two times a day (BID) | ORAL | Status: DC
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
## 2015-03-23 NOTE — Discharge Instructions (Signed)

## 2015-03-29 ENCOUNTER — Other Ambulatory Visit (HOSPITAL_COMMUNITY): Payer: Self-pay | Admitting: Chiropractic Medicine

## 2015-03-29 ENCOUNTER — Ambulatory Visit (HOSPITAL_COMMUNITY)
Admission: RE | Admit: 2015-03-29 | Discharge: 2015-03-29 | Disposition: A | Discharge status: Home / Self Care | Payer: 59 | Source: Ambulatory Visit | Attending: Chiropractic Medicine | Admitting: Chiropractic Medicine

## 2015-03-29 DIAGNOSIS — M419 Scoliosis, unspecified: Secondary | ICD-10-CM | POA: Insufficient documentation

## 2015-03-29 DIAGNOSIS — Z981 Arthrodesis status: Secondary | ICD-10-CM | POA: Insufficient documentation

## 2015-06-03 ENCOUNTER — Ambulatory Visit (INDEPENDENT_AMBULATORY_CARE_PROVIDER_SITE_OTHER): Payer: 59 | Admitting: Podiatry

## 2015-06-03 ENCOUNTER — Encounter: Payer: Self-pay | Admitting: Podiatry

## 2015-06-03 VITALS — BP 148/96 | HR 74 | Resp 18

## 2015-06-03 DIAGNOSIS — L600 Ingrowing nail: Secondary | ICD-10-CM | POA: Diagnosis not present

## 2015-06-03 NOTE — Progress Notes (Signed)
Patient ID: Randy Merritt, male   DOB: 1941-07-02, 74 y.o.   MRN: [id number]  Subjective: 74 year old male presents the office today with concerns of possible ingrown toenail on the right medial big toe. He states that he wore some different shoes which put pressure to the area and he had some tenderness overlying the area. He did try to clean the area himself and afterwards he did have resolution of symptoms. He denies any redness or drainage from the area. He elected area just check to make sure that the procedure site is doing well however he does not have any pain at this time. No other complaints at this time.  Objective: AAO 3, NAD Neurovascular status intact There is slight incurvation of the medial aspect right hallux toenail distally and there is some callus formation overlying the nail border. This is the same area he previously had an infection in the partial nail avulsion was performed. After debridement of the callus on the distal portion of the toenail was incurvated there is no tenderness palpation overlying the area and there is no drainage or purulence expressed. There is no surrounding erythema, edema, increase in warmth. No other areas of tenderness bilateral lower extremity is. No overlying edema, erythema, increased warmth. No pain with calf compression, sling, warmth, erythema. No other open lesions or pre-ulcer lesions identified elsewhere.  Assessment: 74 year old male slight incurvation right medial hallux toenail  Plan: -Treatment options discussed including all alternatives, risks, and complications -Right medial hallux toenail sharply debrided without complications to remove the symptomatic portion of the ingrown toenail. Also there is some callus formation within the nail bed from the prior seizure site which was debrided without complications. There is no signs of infection this time. After debridement there is no pain to the area. Continue to monitor for any reoccurrence.  Follow-up as needed. In the meantime call the office with any questions, concerns, change in symptoms.  Celesta Gentile, DPM

## 2015-06-06 ENCOUNTER — Encounter: Payer: Self-pay | Admitting: Podiatry

## 2016-04-28 ENCOUNTER — Emergency Department (HOSPITAL_COMMUNITY)
Admission: EM | Admit: 2016-04-28 | Discharge: 2016-04-28 | Disposition: A | Discharge status: Home / Self Care | Payer: 59 | Attending: Emergency Medicine | Admitting: Emergency Medicine

## 2016-04-28 ENCOUNTER — Encounter (HOSPITAL_COMMUNITY): Payer: Self-pay

## 2016-04-28 ENCOUNTER — Emergency Department (HOSPITAL_COMMUNITY): Payer: 59

## 2016-04-28 DIAGNOSIS — Z7951 Long term (current) use of inhaled steroids: Secondary | ICD-10-CM | POA: Insufficient documentation

## 2016-04-28 DIAGNOSIS — Z7982 Long term (current) use of aspirin: Secondary | ICD-10-CM | POA: Diagnosis not present

## 2016-04-28 DIAGNOSIS — Z87891 Personal history of nicotine dependence: Secondary | ICD-10-CM | POA: Insufficient documentation

## 2016-04-28 DIAGNOSIS — R109 Unspecified abdominal pain: Secondary | ICD-10-CM | POA: Diagnosis present

## 2016-04-28 DIAGNOSIS — M1990 Unspecified osteoarthritis, unspecified site: Secondary | ICD-10-CM | POA: Diagnosis not present

## 2016-04-28 DIAGNOSIS — F329 Major depressive disorder, single episode, unspecified: Secondary | ICD-10-CM | POA: Insufficient documentation

## 2016-04-28 DIAGNOSIS — Z79899 Other long term (current) drug therapy: Secondary | ICD-10-CM | POA: Insufficient documentation

## 2016-04-28 LAB — BASIC METABOLIC PANEL
Anion gap: 7 (ref 5–15)
BUN: 13 mg/dL (ref 6–20)
CO2: 28 mmol/L (ref 22–32)
Calcium: 9.6 mg/dL (ref 8.9–10.3)
Chloride: 107 mmol/L (ref 101–111)
Creatinine, Ser: 1.53 mg/dL — ABNORMAL HIGH (ref 0.61–1.24)
GFR calc Af Amer: 50 mL/min — ABNORMAL LOW (ref >60)
GFR calc non Af Amer: 43 mL/min — ABNORMAL LOW (ref >60)
Glucose, Bld: 101 mg/dL — ABNORMAL HIGH (ref 65–99)
Potassium: 4.5 mmol/L (ref 3.5–5.1)
Sodium: 142 mmol/L (ref 135–145)

## 2016-04-28 LAB — CBC WITH DIFFERENTIAL/PLATELET
Basophils Absolute: 0 10*3/uL (ref 0.0–0.1)
Basophils Relative: 0 %
Eosinophils Absolute: 0.1 10*3/uL (ref 0.0–0.7)
Eosinophils Relative: 2 %
HCT: 40.3 % (ref 39.0–52.0)
Hemoglobin: 14.2 g/dL (ref 13.0–17.0)
Lymphocytes Relative: 52 %
Lymphs Abs: 1.6 10*3/uL (ref 0.7–4.0)
MCH: 31.8 pg (ref 26.0–34.0)
MCHC: 35.2 g/dL (ref 30.0–36.0)
MCV: 90.2 fL (ref 78.0–100.0)
Monocytes Absolute: 0.3 10*3/uL (ref 0.1–1.0)
Monocytes Relative: 8 %
Neutro Abs: 1.2 10*3/uL — ABNORMAL LOW (ref 1.7–7.7)
Neutrophils Relative %: 38 %
Platelets: 135 10*3/uL — ABNORMAL LOW (ref 150–400)
RBC: 4.47 MIL/uL (ref 4.22–5.81)
RDW: 12.7 % (ref 11.5–15.5)
WBC: 3.1 10*3/uL — ABNORMAL LOW (ref 4.0–10.5)

## 2016-04-28 LAB — URINALYSIS, ROUTINE W REFLEX MICROSCOPIC
Bilirubin Urine: NEGATIVE
Glucose, UA: NEGATIVE mg/dL
Hgb urine dipstick: NEGATIVE
Ketones, ur: NEGATIVE mg/dL
Leukocytes, UA: NEGATIVE
Nitrite: NEGATIVE
Protein, ur: NEGATIVE mg/dL
Specific Gravity, Urine: 1.017 (ref 1.005–1.030)
pH: 5.5 (ref 5.0–8.0)

## 2016-04-28 MED ORDER — METHOCARBAMOL 500 MG PO TABS
500.0000 mg | ORAL_TABLET | Freq: Two times a day (BID) | ORAL | Status: DC
Start: 2016-04-28 — End: 2017-10-11

## 2016-04-28 NOTE — ED Provider Notes (Signed)
CSN: [id number]     Arrival date & time 04/28/16  0816 History   First MD Initiated Contact with Patient 04/28/16 [phone]     Chief Complaint  Patient presents with  . Back Pain     (Consider location/radiation/quality/duration/timing/severity/associated sxs/prior Treatment) HPI Comments: Patient here complaining of right-sided flank pain 24 hours. Pain is characterized as colicky and without radiation to his groin. He notes some urinary urgency and frequency without fever or chills. No emesis. No prior history of renal colic. Has used Tylenol without relief. Denies any rashes to his back. Has not been short of breath. No lower extremity weakness. Nothing makes his symptoms better but they are somewhat worse with certain positions.  Patient is a 75 y.o. male presenting with back pain. The history is provided by the patient.  Back Pain   Past Medical History  Diagnosis Date  . High cholesterol   . Acid reflux   . Depression     " a little"  . Anxiety   . Seasonal allergies   . History of blood transfusion   . Arthritis    Past Surgical History  Procedure Laterality Date  . Gallstone removal    . Cholecystectomy    . Cervical discectomy      x2  . Anterior cervical decomp/discectomy fusion N/A 04/08/2013    Procedure: ANTERIOR CERVICAL DECOMPRESSION/DISCECTOMY FUSION 1 LEVEL;  Surgeon: Floyce Stakes, MD;  Location: [facility];  Service: Neurosurgery;  Laterality: N/A;  Cervical five-six Anterior cervical decompression/diskectomy fusion   No family history on file. Social History  Substance Use Topics  . Smoking status: Former Research scientist (life sciences)  . Smokeless tobacco: Never Used  . Alcohol Use: No    Review of Systems  Musculoskeletal: Positive for back pain.  All other systems reviewed and are negative.     Allergies  Review of patient's allergies indicates no known allergies.  Home Medications   Prior to Admission medications   Medication Sig Start Date End Date Taking?  Authorizing Provider  cephALEXin (KEFLEX) 500 MG capsule Take 1 capsule (500 mg total) by mouth 2 (two) times daily. 10/23/14   Trula Slade, DPM  docusate sodium (COLACE) 100 MG capsule Take 1 capsule (100 mg total) by mouth every 12 (twelve) hours. 03/23/15   Kristen N Ward, DO  finasteride (PROSCAR) 5 MG tablet Take 5 mg by mouth at bedtime.     Historical Provider, MD  HYDROcodone-acetaminophen (NORCO/VICODIN) 5-325 MG per tablet Take 1 tablet by mouth every 6 (six) hours as needed. 03/23/15   Kristen N Ward, DO  levocetirizine (XYZAL) 5 MG tablet Take 5 mg by mouth every evening.    Historical Provider, MD  pantoprazole (PROTONIX) 40 MG tablet Take 40 mg by mouth at bedtime.     Historical Provider, MD  rosuvastatin (CRESTOR) 20 MG tablet Take 20 mg by mouth at bedtime.     Historical Provider, MD   BP 146/81 mmHg  Pulse 81  Temp(Src) 97.6 F (36.4 C) (Oral)  Resp 18  SpO2 99% Physical Exam  Constitutional: He is oriented to person, place, and time. He appears well-developed and well-nourished.  Non-toxic appearance. No distress.  HENT:  Head: Normocephalic and atraumatic.  Eyes: Conjunctivae, EOM and lids are normal. Pupils are equal, round, and reactive to light.  Neck: Normal range of motion. Neck supple. No tracheal deviation present. No thyroid mass present.  Cardiovascular: Normal rate, regular rhythm and normal heart sounds.  Exam reveals no gallop.  No murmur heard. Pulmonary/Chest: Effort normal and breath sounds normal. No stridor. No respiratory distress. He has no decreased breath sounds. He has no wheezes. He has no rhonchi. He has no rales.  Abdominal: Soft. Normal appearance and bowel sounds are normal. He exhibits no distension. There is no tenderness. There is no rebound and no CVA tenderness.  Musculoskeletal: Normal range of motion. He exhibits no edema or tenderness.  Neurological: He is alert and oriented to person, place, and time. He has normal strength. No  cranial nerve deficit or sensory deficit. GCS eye subscore is 4. GCS verbal subscore is 5. GCS motor subscore is 6.  Skin: Skin is warm and dry. No abrasion and no rash noted.  Psychiatric: He has a normal mood and affect. His speech is normal and behavior is normal.  Nursing note and vitals reviewed.   ED Course  Procedures (including critical care time) Labs Review Labs Reviewed  URINE CULTURE  URINALYSIS, ROUTINE W REFLEX MICROSCOPIC (NOT AT [facility])  CBC WITH DIFFERENTIAL/PLATELET  BASIC METABOLIC PANEL    Imaging Review No results found. I have personally reviewed and evaluated these images and lab results as part of my medical decision-making.   EKG Interpretation None      MDM   Final diagnoses:  None   Renal CT negative for kidney stone. Urinalysis negative for infection Patient likely musculoskeletal strain. Will place a muscle relaxant discharge home    Lacretia Leigh, MD 04/28/16 1035

## 2016-04-28 NOTE — ED Notes (Signed)
Delay in blood collection related to pt request to use urinal. Pt states will use call light and alert staff when done.

## 2016-04-28 NOTE — ED Notes (Signed)
He c/o right lower back/flank; plus right shoulder and arm pain for several days.  He is in no distress.

## 2016-04-28 NOTE — Discharge Instructions (Signed)
Flank Pain Flank pain refers to pain that is located on the side of the body between the upper abdomen and the back. The pain may occur over a short period of time (acute) or may be long-term or reoccurring (chronic). It may be mild or severe. Flank pain can be caused by many things. CAUSES  Some of the more common causes of flank pain include:  Muscle strains.   Muscle spasms.   A disease of your spine (vertebral disk disease).   A lung infection (pneumonia).   Fluid around your lungs (pulmonary edema).   A kidney infection.   Kidney stones.   A very painful skin rash caused by the chickenpox virus (shingles).   Gallbladder disease.  [place] care will depend on the cause of your pain. In general,  Rest as directed by your caregiver.  Drink enough fluids to keep your urine clear or pale yellow.  Only take over-the-counter or prescription medicines as directed by your caregiver. Some medicines may help relieve the pain.  Tell your caregiver about any changes in your pain.  Follow up with your caregiver as directed. SEEK IMMEDIATE MEDICAL CARE IF:   Your pain is not controlled with medicine.   You have new or worsening symptoms.  Your pain increases.   You have abdominal pain.   You have shortness of breath.   You have persistent nausea or vomiting.   You have swelling in your abdomen.   You feel faint or pass out.   You have blood in your urine.  You have a fever or persistent symptoms for more than 2-3 days.  You have a fever and your symptoms suddenly get worse. MAKE SURE YOU:   Understand these instructions.  Will watch your condition.  Will get help right away if you are not doing well or get worse.   This information is not intended to replace advice given to you by your health care provider. Make sure you discuss any questions you have with your health care provider.   Document Released: 12/20/2005 Document  Revised: 07/23/2012 Document Reviewed: 06/12/2012 Elsevier Interactive Patient Education 2016 [employer] Muscle Pain, Adult Muscle pain (myalgia) may be caused by many things, including:  Overuse or muscle strain, especially if you are not in shape. This is the most common cause of muscle pain.  Injury.  Bruises.  Viruses, such as the flu.  Infectious diseases.  Fibromyalgia, which is a chronic condition that causes muscle tenderness, fatigue, and headache.  Autoimmune diseases, including lupus.  Certain drugs, including ACE inhibitors and statins. Muscle pain may be mild or severe. In most cases, the pain lasts only a short time and goes away without treatment. To diagnose the cause of your muscle pain, your health care provider will take your medical history. This means he or she will ask you when your muscle pain began and what has been happening. If you have not had muscle pain for very long, your health care provider may want to wait before doing much testing. If your muscle pain has lasted a long time, your health care provider may want to run tests right away. If your health care provider thinks your muscle pain may be caused by illness, you may need to have additional tests to rule out certain conditions.  Treatment for muscle pain depends on the cause. Home care is often enough to relieve muscle pain. Your health care provider may also prescribe anti-inflammatory medicine. HOME CARE INSTRUCTIONS Watch  your condition for any changes. The following actions may help to lessen any discomfort you are feeling:  Only take over-the-counter or prescription medicines as directed by your health care provider.  Apply ice to the sore muscle:  Put ice in a plastic bag.  Place a towel between your skin and the bag.  Leave the ice on for 15-20 minutes, 3-4 times a day.  You may alternate applying hot and cold packs to the muscle as directed by your health care provider.  If overuse  is causing your muscle pain, slow down your activities until the pain goes away.  Remember that it is normal to feel some muscle pain after starting a workout program. Muscles that have not been used often will be sore at first.  Do regular, gentle exercises if you are not usually active.  Warm up before exercising to lower your risk of muscle pain.  Do not continue working out if the pain is very bad. Bad pain could mean you have injured a muscle. SEEK MEDICAL CARE IF:  Your muscle pain gets worse, and medicines do not help.  You have muscle pain that lasts longer than 3 days.  You have a rash or fever along with muscle pain.  You have muscle pain after a tick bite.  You have muscle pain while working out, even though you are in good physical condition.  You have redness, soreness, or swelling along with muscle pain.  You have muscle pain after starting a new medicine or changing the dose of a medicine. SEEK IMMEDIATE MEDICAL CARE IF:  You have trouble breathing.  You have trouble swallowing.  You have muscle pain along with a stiff neck, fever, and vomiting.  You have severe muscle weakness or cannot move part of your body. MAKE SURE YOU:   Understand these instructions.  Will watch your condition.  Will get help right away if you are not doing well or get worse.   This information is not intended to replace advice given to you by your health care provider. Make sure you discuss any questions you have with your health care provider.   Document Released: 09/20/2006 Document Revised: 11/19/2014 Document Reviewed: 08/25/2013 Elsevier Interactive Patient Education [employer].

## 2016-04-29 LAB — URINE CULTURE: Culture: NO GROWTH

## 2017-01-28 ENCOUNTER — Emergency Department (HOSPITAL_COMMUNITY)
Admission: EM | Admit: 2017-01-28 | Discharge: 2017-01-28 | Disposition: A | Discharge status: Home / Self Care | Payer: 59 | Attending: Emergency Medicine | Admitting: Emergency Medicine

## 2017-01-28 ENCOUNTER — Encounter (HOSPITAL_COMMUNITY): Payer: Self-pay

## 2017-01-28 ENCOUNTER — Emergency Department (HOSPITAL_COMMUNITY): Payer: 59

## 2017-01-28 DIAGNOSIS — R093 Abnormal sputum: Secondary | ICD-10-CM | POA: Diagnosis not present

## 2017-01-28 DIAGNOSIS — R0989 Other specified symptoms and signs involving the circulatory and respiratory systems: Secondary | ICD-10-CM

## 2017-01-28 DIAGNOSIS — Z79899 Other long term (current) drug therapy: Secondary | ICD-10-CM | POA: Diagnosis not present

## 2017-01-28 DIAGNOSIS — Z87891 Personal history of nicotine dependence: Secondary | ICD-10-CM | POA: Insufficient documentation

## 2017-01-28 DIAGNOSIS — R0981 Nasal congestion: Secondary | ICD-10-CM | POA: Diagnosis present

## 2017-01-28 NOTE — ED Triage Notes (Signed)
Pt states woke up this morning felt thickness in throat.  Congestion.  Clearing throat and change in voice.  States no throat pain specifically but sometimes feels lump on left side of neck.  No fever.  Dry cough.

## 2017-01-28 NOTE — ED Notes (Signed)
Bed: WA20 Expected date:  Expected time:  Means of arrival:  Comments: 

## 2017-01-28 NOTE — ED Provider Notes (Signed)
[place] DEPT Provider Note   CSN: [id number] Arrival date & time: 01/28/17 0086     History    Chief Complaint  Patient presents with  . Nasal Congestion  . Cough     HPI Randy Merritt is a 76 y.o. male.  76yo M w/ PMH below who p/w throat phlegm and nasal congestion. Patient states that for at least one year he has had a feeling of thickness in his throat requiring frequent throat clearing and coughing to get up phlegm. He denies any actual throat pain. He reports some mild nasal congestion. He has been seen by 3 different doctors including an ENT specialist recently for these symptoms. He was started on nose spray which he has been using without any improvement. He has an occasional dry cough but denies any significant cough or breathing problems. He denies any fevers or recent illness.   Past Medical History:  Diagnosis Date  . Acid reflux   . Anxiety   . Arthritis   . Depression    " a little"  . High cholesterol   . History of blood transfusion   . Seasonal allergies      There are no active problems to display for this patient.   Past Surgical History:  Procedure Laterality Date  . ANTERIOR CERVICAL DECOMP/DISCECTOMY FUSION N/A 04/08/2013   Procedure: ANTERIOR CERVICAL DECOMPRESSION/DISCECTOMY FUSION 1 LEVEL;  Surgeon: Floyce Stakes, MD;  Location: [facility];  Service: Neurosurgery;  Laterality: N/A;  Cervical five-six Anterior cervical decompression/diskectomy fusion  . CERVICAL DISCECTOMY     x2  . CHOLECYSTECTOMY    . gallstone removal          Home Medications    Prior to Admission medications   Medication Sig Start Date End Date Taking? Authorizing Provider  alfuzosin (UROXATRAL) 10 MG 24 hr tablet Take 10 mg by mouth daily. 04/19/16   Historical Provider, MD  aspirin EC 81 MG tablet Take 81 mg by mouth daily.    Historical Provider, MD  atorvastatin (LIPITOR) 40 MG tablet Take 40 mg by mouth daily.    Historical Provider, MD    cephALEXin (KEFLEX) 500 MG capsule Take 1 capsule (500 mg total) by mouth 2 (two) times daily. Patient not taking: Reported on 04/28/2016 10/23/14   Trula Slade, DPM  chlordiazePOXIDE (LIBRIUM) 25 MG capsule Take 25 mg by mouth 3 (three) times daily as needed. anxiety 04/02/16   Historical Provider, MD  docusate sodium (COLACE) 100 MG capsule Take 1 capsule (100 mg total) by mouth every 12 (twelve) hours. Patient not taking: Reported on 04/28/2016 03/23/15   Kristen N Ward, DO  finasteride (PROSCAR) 5 MG tablet Take 5 mg by mouth at bedtime.     Historical Provider, MD  fluticasone (FLONASE) 50 MCG/ACT nasal spray Place 1 spray into both nostrils daily as needed. For sinus congestion 02/28/16   Historical Provider, MD  HYDROcodone-acetaminophen (NORCO/VICODIN) 5-325 MG per tablet Take 1 tablet by mouth every 6 (six) hours as needed. Patient not taking: Reported on 04/28/2016 03/23/15   Delice Bison Ward, DO  levocetirizine (XYZAL) 5 MG tablet Take 5 mg by mouth every evening.    Historical Provider, MD  methocarbamol (ROBAXIN) 500 MG tablet Take 1 tablet (500 mg total) by mouth 2 (two) times daily. 04/28/16   Lacretia Leigh, MD  pantoprazole (PROTONIX) 40 MG tablet Take 40 mg by mouth at bedtime.     Historical Provider, MD  TRAVATAN Z 0.004 % SOLN ophthalmic  solution Place 1 drop into both eyes daily. 02/02/16   Historical Provider, MD      History reviewed. No pertinent family history.   Social History  Substance Use Topics  . Smoking status: Former Research scientist (life sciences)  . Smokeless tobacco: Never Used  . Alcohol use No     Allergies     Patient has no known allergies.    Review of Systems  10 Systems reviewed and are negative for acute change except as noted in the HPI.   Physical Exam Updated Vital Signs BP (!) 145/80 (BP Location: Left Arm)   Pulse 72   Temp 97.9 F (36.6 C) (Oral)   Resp 18   Ht 5\' 6"  (1.676 m)   Wt 175 lb (79.4 kg)   SpO2 99%   BMI 28.25 kg/m   Physical Exam   Constitutional: He is oriented to person, place, and time. He appears well-developed and well-nourished. No distress.  Frequent throat clearing  HENT:  Head: Normocephalic and atraumatic.  Right Ear: Tympanic membrane and ear canal normal.  Left Ear: Tympanic membrane and ear canal normal.  Mouth/Throat: Oropharynx is clear and moist. No oropharyngeal exudate.  Tolerating secretions, Moist mucous membranes  Eyes: Conjunctivae are normal. Pupils are equal, round, and reactive to light.  Neck: Neck supple. No tracheal deviation present.  Fullness of thyroid gland  Cardiovascular: Normal rate, regular rhythm and normal heart sounds.   No murmur heard. Pulmonary/Chest: Effort normal and breath sounds normal.  Abdominal: Soft. Bowel sounds are normal. He exhibits no distension. There is no tenderness.  Musculoskeletal: He exhibits no edema.  Neurological: He is alert and oriented to person, place, and time.  Fluent speech  Skin: Skin is warm and dry.  Psychiatric: He has a normal mood and affect. Judgment normal.  Nursing note and vitals reviewed.     ED Treatments / Results  Labs (all labs ordered are listed, but only abnormal results are displayed) Labs Reviewed - No data to display   EKG  EKG Interpretation  Date/Time:    Ventricular Rate:    PR Interval:    QRS Duration:   QT Interval:    QTC Calculation:   R Axis:     Text Interpretation:           Radiology Dg Chest 2 View  Result Date: 01/28/2017 CLINICAL DATA:  Ongoing cough, congestion EXAM: CHEST  2 VIEW COMPARISON:  None. FINDINGS: The heart size and mediastinal contours are within normal limits. Both lungs are clear. The visualized skeletal structures are unremarkable. IMPRESSION: No active cardiopulmonary disease. Electronically Signed   By: Kathreen Devoid   On: 01/28/2017 08:45    Procedures Procedures (including critical care time) Procedures  Medications Ordered in ED  Medications - No data to  display   Initial Impression / Assessment and Plan / ED Course  I have reviewed the triage vital signs and the nursing notes.  Pertinent  imaging results that were available during my care of the patient were reviewed by me and considered in my medical decision making (see chart for details).     Pt w/ 1 year of throat phlegm requiring frequent clearing of throat without respiratory symptoms or dysphagia. Well appearing on exam w/ normal VS. breath sounds clear. His thyroid was full on exam but no tracheal deviation, oropharynx clear. I read the note from recent ENT visit during which Dr. Erik Obey initiated nasal steroid and prilosec and requested progress report/follow up visit in 1 month.  Given persistence of sx, I have recommended that he contact ENT clinic to schedule follow-up appointment and continue all medications until that visit. Given the chronicity of his symptoms and his well appearance here, I see no evidence of life-threatening process such as RPA, obstructing mass. Reviewed supportive care and patient discharged in satisfactory condition.  Final Clinical Impressions(s) / ED Diagnoses   Final diagnoses:  Phlegm in throat     New Prescriptions   No medications on file       Sharlett Iles, MD 01/28/17 [phone]

## 2017-02-01 DIAGNOSIS — K219 Gastro-esophageal reflux disease without esophagitis: Secondary | ICD-10-CM | POA: Insufficient documentation

## 2017-02-01 DIAGNOSIS — J310 Chronic rhinitis: Secondary | ICD-10-CM | POA: Insufficient documentation

## 2017-02-02 DIAGNOSIS — R1312 Dysphagia, oropharyngeal phase: Secondary | ICD-10-CM | POA: Insufficient documentation

## 2017-02-06 ENCOUNTER — Other Ambulatory Visit: Payer: Self-pay | Admitting: Otolaryngology

## 2017-02-06 DIAGNOSIS — K219 Gastro-esophageal reflux disease without esophagitis: Secondary | ICD-10-CM

## 2017-04-15 ENCOUNTER — Other Ambulatory Visit: Payer: Self-pay | Admitting: Cardiology

## 2017-04-15 DIAGNOSIS — R079 Chest pain, unspecified: Secondary | ICD-10-CM

## 2017-05-03 ENCOUNTER — Encounter (HOSPITAL_COMMUNITY)
Admission: RE | Admit: 2017-05-03 | Discharge: 2017-05-03 | Disposition: A | Discharge status: Home / Self Care | Payer: 59 | Source: Ambulatory Visit | Attending: Cardiology | Admitting: Cardiology

## 2017-05-03 DIAGNOSIS — R079 Chest pain, unspecified: Secondary | ICD-10-CM | POA: Insufficient documentation

## 2017-05-03 MED ORDER — TECHNETIUM TC 99M TETROFOSMIN IV KIT
10.0000 | PACK | Freq: Once | INTRAVENOUS | Status: AC
  Administered 2017-05-03: 10 via INTRAVENOUS

## 2017-05-03 MED ORDER — TECHNETIUM TC 99M TETROFOSMIN IV KIT
30.0000 | PACK | Freq: Once | INTRAVENOUS | Status: AC | PRN
  Administered 2017-05-03: 30 via INTRAVENOUS

## 2017-05-03 MED ORDER — REGADENOSON 0.4 MG/5ML IV SOLN: 0.4000 mg | Freq: Once | INTRAVENOUS | Status: DC

## 2017-05-03 MED ORDER — REGADENOSON 0.4 MG/5ML IV SOLN
0.4000 mg | Freq: Once | INTRAVENOUS | Status: AC
  Administered 2017-05-03: 0.4 mg via INTRAVENOUS

## 2017-05-03 MED ORDER — REGADENOSON 0.4 MG/5ML IV SOLN
INTRAVENOUS | Status: AC
  Administered 2017-05-03: 0.4 mg via INTRAVENOUS
  Filled 2017-05-03: qty 5

## 2017-09-18 DIAGNOSIS — H61001 Unspecified perichondritis of right external ear: Secondary | ICD-10-CM | POA: Insufficient documentation

## 2017-09-19 ENCOUNTER — Other Ambulatory Visit: Payer: Self-pay | Admitting: Otolaryngology

## 2017-09-19 DIAGNOSIS — R1312 Dysphagia, oropharyngeal phase: Secondary | ICD-10-CM

## 2017-09-19 DIAGNOSIS — K219 Gastro-esophageal reflux disease without esophagitis: Secondary | ICD-10-CM

## 2017-09-27 ENCOUNTER — Ambulatory Visit
Admission: RE | Admit: 2017-09-27 | Discharge: 2017-09-27 | Disposition: A | Discharge status: Home / Self Care | Payer: 59 | Source: Ambulatory Visit | Attending: Otolaryngology | Admitting: Otolaryngology

## 2017-09-27 DIAGNOSIS — R1312 Dysphagia, oropharyngeal phase: Secondary | ICD-10-CM

## 2017-09-27 DIAGNOSIS — K219 Gastro-esophageal reflux disease without esophagitis: Secondary | ICD-10-CM

## 2017-10-11 ENCOUNTER — Ambulatory Visit: Payer: 59 | Admitting: Podiatry

## 2017-10-11 ENCOUNTER — Encounter: Payer: Self-pay | Admitting: Podiatry

## 2017-10-11 DIAGNOSIS — L600 Ingrowing nail: Secondary | ICD-10-CM | POA: Diagnosis not present

## 2017-10-11 NOTE — Progress Notes (Signed)
Subjective: Randy Merritt presents the office today for concerns of ingrown toenail to the left third toe, pointing to the lateral aspect which is been ongoing for the last couple of weeks.  The area is painful with pressure in shoes.  Denies seeing any drainage or pus coming from the area.  He is try to trim the nail without any significant improvement.  He also states that the area on the right toe is been doing very well but there is some dry skin on the nail corner but denies any drainage or pus.  He has no other concerns today. Denies any systemic complaints such as fevers, chills, nausea, vomiting. No acute changes since last appointment, and no other complaints at this time.   Objective: AAO x3, NAD DP/PT pulses palpable bilaterally, CRT less than 3 seconds Incurvation present along the lateral aspect the left hallux toenail with tenderness palpation.  There is localized edema on the lateral aspect the nail corner but there is no erythema, drainage or pus or any clinical signs of infection present. No open lesions or pre-ulcerative lesions identified.  The right nail procedure site is doing well.  Small amount of dry skin present on the nail corner. No open lesions or pre-ulcerative lesions.  No pain with calf compression, swelling, warmth, erythema  Assessment: Ingrown toenail lateral left third digit toenail; Right side doing well  Plan: -All treatment options discussed with the patient including all alternatives, risks, complications.  -At this time, the patient is requesting partial nail removal with chemical matricectomy to the symptomatic portion of the nail. Risks and complications were discussed with the patient for which they understand and  verbally consent to the procedure. Under sterile conditions a total of 3 mL of a mixture of 2% lidocaine plain and 0.5% Marcaine plain was infiltrated in a digital block fashion. Once anesthetized, the skin was prepped in sterile fashion. A  tourniquet was then applied. Next the lateral aspect of hallux nail border was then sharply excised making sure to remove the entire offending nail border. Once the nails were ensured to be removed area was debrided and the underlying skin was intact. There is no purulence identified in the procedure. Next phenol was then applied under standard conditions and copiously irrigated. Silvadene was applied. A dry sterile dressing was applied. After application of the dressing the tourniquet was removed and there is found to be an immediate capillary refill time to the digit. The patient tolerated the procedure well any complications. Post procedure instructions were discussed the patient for which he verbally understood. Follow-up in one week for nail check or sooner if any problems are to arise. Discussed signs/symptoms of infection and directed to call the office immediately should any occur or go directly to the emergency room. In the meantime, encouraged to call the office with any questions, concerns, changes symptoms. -Debrided right hallux nail corner without complications.  -Patient encouraged to call the office with any questions, concerns, change in symptoms.   Trula Slade DPM

## 2017-10-11 NOTE — Patient Instructions (Signed)

## 2017-10-21 ENCOUNTER — Ambulatory Visit: Payer: 59 | Admitting: Podiatry

## 2017-10-28 ENCOUNTER — Encounter: Payer: Self-pay | Admitting: Podiatry

## 2017-10-28 ENCOUNTER — Ambulatory Visit (INDEPENDENT_AMBULATORY_CARE_PROVIDER_SITE_OTHER): Payer: 59 | Admitting: Podiatry

## 2017-10-28 DIAGNOSIS — L600 Ingrowing nail: Secondary | ICD-10-CM

## 2017-10-28 NOTE — Patient Instructions (Signed)

## 2017-10-31 NOTE — Progress Notes (Signed)
Subjective: Randy Merritt is a 76 y.o.  male returns to office today for follow up evaluation after having left 3rd digit partial toenail avulsion performed. Patient has been soaking using epsom salts and applying topical antibiotic covered with bandaid daily.  He denies any pain to the area denies any redness or drainage or any swelling. Patient denies fevers, chills, nausea, vomiting. Denies any calf pain, chest pain, SOB.   Objective:  Vitals: Reviewed  General: Well developed, nourished, in no acute distress, alert and oriented x3   Dermatology: Skin is warm, dry and supple bilateral.  Left third digit nail border appears to be clean, dry, with mild granular tissue and surrounding scab. There is no surrounding erythema, edema, drainage/purulence. The remaining nails appear unremarkable at this time. There are no other lesions or other signs of infection present.  Neurovascular status: Intact. No lower extremity swelling; No pain with calf compression bilateral.  Musculoskeletal: Notenderness to palpation of the left 3rd digit nail site. Muscular strength within normal limits bilateral.   Assesement and Plan: S/p partial nail avulsion, doing well.   -Continue soaking in epsom salts twice a day followed by antibiotic ointment and a band-aid. Can leave uncovered at night. Continue this until completely healed.  -If the area has not healed in 2 weeks, call the office for follow-up appointment, or sooner if any problems arise.  -Monitor for any signs/symptoms of infection. Call the office immediately if any occur or go directly to the emergency room. Call with any questions/concerns.  *Today he mentioned that he did feel lightheaded yesterday he feels better today.  He denies any other symptoms.  Denies any chest pain, shortness of breath, loss of consciousness or any other issues.  I recommend him to follow-up with his primary care physician.  He states he is to call his doctor  today.  Celesta Gentile, DPM

## 2017-11-06 ENCOUNTER — Other Ambulatory Visit: Payer: Self-pay

## 2017-11-06 ENCOUNTER — Emergency Department (HOSPITAL_COMMUNITY)
Admission: EM | Admit: 2017-11-06 | Discharge: 2017-11-06 | Disposition: A | Discharge status: Home / Self Care | Payer: 59 | Attending: Emergency Medicine | Admitting: Emergency Medicine

## 2017-11-06 ENCOUNTER — Encounter (HOSPITAL_COMMUNITY): Payer: Self-pay | Admitting: Emergency Medicine

## 2017-11-06 DIAGNOSIS — K219 Gastro-esophageal reflux disease without esophagitis: Secondary | ICD-10-CM | POA: Insufficient documentation

## 2017-11-06 DIAGNOSIS — Z7982 Long term (current) use of aspirin: Secondary | ICD-10-CM | POA: Diagnosis not present

## 2017-11-06 DIAGNOSIS — R1310 Dysphagia, unspecified: Secondary | ICD-10-CM | POA: Diagnosis not present

## 2017-11-06 DIAGNOSIS — Z79899 Other long term (current) drug therapy: Secondary | ICD-10-CM | POA: Insufficient documentation

## 2017-11-06 DIAGNOSIS — R093 Abnormal sputum: Secondary | ICD-10-CM | POA: Diagnosis present

## 2017-11-06 DIAGNOSIS — Z87891 Personal history of nicotine dependence: Secondary | ICD-10-CM | POA: Insufficient documentation

## 2017-11-06 NOTE — ED Triage Notes (Addendum)
Pt states he has been seen by ENT, states he was seen earlier this year and told his glands in his neck were swollen. Pt states he did a swallow test, but states he is unable to swallow thick stuff and sometimes feels like his medication is getting stuck in his throat when he swallows it. Pt has been seen for his heartburn and "excess mucous production" per pt in the past. Pt states he never got a call back from ENT because "my wife's insurance was not working for them." States he has been struggling with this problem for almost a year. Pt denies any chest pain. Pt actively spitting out mucous in triage. No trouble breathing. No acute distress. Pt unsure if he has ever had thyroid tests.

## 2017-11-06 NOTE — ED Provider Notes (Signed)
[facility] EMERGENCY DEPARTMENT Provider Note   CSN: [id number] Arrival date & time: 11/06/17  [id number]     History   Chief Complaint Chief Complaint  Patient presents with  . excess mucous production  . Gastroesophageal Reflux    HPI Randy Merritt is a 76 y.o. male.  Patient reports that his mouth feels up with mucus when he lies supine for approximately a year, becoming worse in the past 6 months but not changing.  He denies any shortness of breath denies fever.  No other associated symptoms.  He is been evaluated by Dr.Wolicki for same complaint and evaluated in the emergency department in March 2018 for same complaint and he underwent barium swallow November 2018 which showed moderate vallecular and piriform sinus stasis recommended speech pathology and modified barium swallow thought to be helpful.  He did not follow-up.  Was also recommended that he use steroid nasal spray and Prilosec.  He has been compliant with nasal spray but reports not using Prilosec.  No other associated symptoms.  Symptoms worse with lying supine and improved with sitting upright denies fever denies shortness of breath  HPI  Past Medical History:  Diagnosis Date  . Acid reflux   . Anxiety   . Arthritis   . Depression    " a little"  . High cholesterol   . History of blood transfusion   . Seasonal allergies     Patient Active Problem List   Diagnosis Date Noted  . Ingrown toenail 10/11/2017  . Chondrodermatitis nodularis helicis of right ear 93/81/0175  . Oropharyngeal dysphagia 02/02/2017  . Chronic rhinitis 02/01/2017  . Gastroesophageal reflux disease 02/01/2017    Past Surgical History:  Procedure Laterality Date  . ANTERIOR CERVICAL DECOMP/DISCECTOMY FUSION N/A 04/08/2013   Procedure: ANTERIOR CERVICAL DECOMPRESSION/DISCECTOMY FUSION 1 LEVEL;  Surgeon: Floyce Stakes, MD;  Location: [facility];  Service: Neurosurgery;  Laterality: N/A;  Cervical five-six Anterior  cervical decompression/diskectomy fusion  . CERVICAL DISCECTOMY     x2  . CHOLECYSTECTOMY    . gallstone removal         Home Medications    Prior to Admission medications   Medication Sig Start Date End Date Taking? Authorizing Provider  alfuzosin (UROXATRAL) 10 MG 24 hr tablet Take 10 mg by mouth daily. 04/19/16  Yes [provider]  aspirin EC 81 MG tablet Take 81 mg by mouth daily.   Yes [provider]  atorvastatin (LIPITOR) 40 MG tablet Take 40 mg by mouth daily.   Yes [provider]  chlordiazePOXIDE (LIBRIUM) 25 MG capsule Take 25 mg by mouth 3 (three) times daily as needed. anxiety 04/02/16  Yes [provider]  finasteride (PROSCAR) 5 MG tablet Take 5 mg by mouth at bedtime.    Yes [provider]  fluticasone (FLONASE) 50 MCG/ACT nasal spray Place 1 spray into both nostrils daily as needed. For sinus congestion 02/28/16  Yes [provider]  ipratropium (ATROVENT) 0.03 % nasal spray  08/28/17  Yes [provider]  pantoprazole (PROTONIX) 40 MG tablet Take 40 mg by mouth at bedtime.    Yes [provider]  traZODone (DESYREL) 50 MG tablet Take 50 mg by mouth at bedtime. 10/23/17  Yes [provider]  FLUZONE HIGH-DOSE 0.5 ML injection inject 0.5 milliliter intramuscularly 07/09/17   [provider]    Family History No family history on file.  Social History Social History   Tobacco Use  . Smoking  status: Former Research scientist (life sciences)  . Smokeless tobacco: Never Used  Substance Use Topics  . Alcohol use: No  . Drug use: No     Allergies   Patient has no known allergies.   Review of Systems Review of Systems  HENT:       Dysphagia  All other systems reviewed and are negative.    Physical Exam Updated Vital Signs BP (!) 147/87 (BP Location: Right Arm)   Pulse 63   Temp (!) 97.4 F (36.3 C) (Oral)   Resp 18   SpO2 100%   Physical Exam  Constitutional: He is oriented to person,  place, and time. He appears well-developed and well-nourished.  Handling secretions well.  No distress  HENT:  Head: Normocephalic and atraumatic.  oroPharynx normal  Eyes: Conjunctivae are normal. Pupils are equal, round, and reactive to light.  Neck: Neck supple. No tracheal deviation present. No thyromegaly present.  Cardiovascular: Normal rate and regular rhythm.  No murmur heard. Pulmonary/Chest: Effort normal and breath sounds normal.  Abdominal: Soft. Bowel sounds are normal. He exhibits no distension. There is no tenderness.  Musculoskeletal: Normal range of motion. He exhibits no edema or tenderness.  Neurological: He is alert and oriented to person, place, and time. Coordination normal.  Skin: Skin is warm and dry. No rash noted.  Psychiatric: He has a normal mood and affect.  Nursing note and vitals reviewed.    ED Treatments / Results  Labs (all labs ordered are listed, but only abnormal results are displayed) Labs Reviewed - No data to display  EKG  EKG Interpretation None       Radiology No results found.  Procedures Procedures (including critical care time)  Medications Ordered in ED Medications - No data to display   Initial Impression / Assessment and Plan / ED Course  I have reviewed the triage vital signs and the nursing notes.  Pertinent labs & imaging results that were available during my care of the patient were reviewed by me and considered in my medical decision making (see chart for details).     Patient symptoms are chronic.  I suggest that he use Prilosec OTC as directed follow-up with Dr. Erik Obey  Final Clinical Impressions(s) / ED Diagnoses  Diagnosis chronic dysphagia Final diagnoses:  Dysphagia, unspecified type    ED Discharge Orders    None       Orlie Dakin, MD 11/06/17 1246

## 2017-11-06 NOTE — Discharge Instructions (Signed)
Take Prilosec OTC as directed.  Call DR. Wolicki the next available appointment

## 2017-11-12 ENCOUNTER — Other Ambulatory Visit (HOSPITAL_COMMUNITY): Payer: Self-pay | Admitting: Otolaryngology

## 2017-11-12 DIAGNOSIS — R1319 Other dysphagia: Secondary | ICD-10-CM

## 2017-11-21 ENCOUNTER — Ambulatory Visit (HOSPITAL_COMMUNITY)
Admission: RE | Admit: 2017-11-21 | Discharge: 2017-11-21 | Disposition: A | Discharge status: Home / Self Care | Payer: 59 | Source: Ambulatory Visit | Attending: Otolaryngology | Admitting: Otolaryngology

## 2017-11-21 DIAGNOSIS — R1319 Other dysphagia: Secondary | ICD-10-CM | POA: Diagnosis present

## 2017-11-21 DIAGNOSIS — R1310 Dysphagia, unspecified: Secondary | ICD-10-CM | POA: Insufficient documentation

## 2017-11-21 NOTE — Progress Notes (Signed)
Modified Barium Swallow Progress Note  Patient Details  Name: Randy Merritt MRN: [id number] Date of Birth: [birth year]  Today's Date: 11/21/2017  Modified Barium Swallow completed.  Full report located under Chart Review in the Imaging Section.  Brief recommendations include the following:  Clinical Impression    Pt exhibits mild pharyngoesophagel dysphagia marked by decreased pharyngeal contraction and suspected reduced upper esophageal pressure resulting in stasis. Vallecular and pyriform sinus residue with mild penetration (trace remained in laryngeal vestibule) from pyriform sinus residue x 2 during subsequent swallow. He intermittently senses penetrates. Recommend regular texture, thin liquids, swallow two times, alternate liquids and solids, clear throat during meals, small bites/sips, pills whole in appleauce and educated re: refux strategies.        Swallow Evaluation Recommendations       SLP Diet Recommendations: Regular solids;Thin liquid   Liquid Administration via: Cup   Medication Administration: Whole meds with puree   Supervision: Patient able to self feed   Compensations: Slow rate;Small sips/bites;Multiple dry swallows after each bite/sip;Follow solids with liquid;Clear throat intermittently   Postural Changes: Remain semi-upright after after feeds/meals (Comment);Seated upright at 90 degrees   Oral Care Recommendations: Oral care BID        Randy Merritt 11/21/2017,2:34 PM   Randy Merritt [place].Ed [employer] [phone]

## 2017-12-24 ENCOUNTER — Ambulatory Visit: Payer: 59 | Admitting: Podiatry

## 2018-01-08 ENCOUNTER — Telehealth: Payer: Self-pay | Admitting: Oncology

## 2018-01-08 ENCOUNTER — Encounter: Payer: Self-pay | Admitting: Oncology

## 2018-01-08 NOTE — Telephone Encounter (Signed)
Appt has been scheduled for the pt to see Dr. Alen Blew on 3/20 at 11am. Pt aware to arrive 30 minutes early. Letter mailed to the pt.

## 2018-01-29 ENCOUNTER — Telehealth: Payer: Self-pay | Admitting: Oncology

## 2018-01-29 ENCOUNTER — Inpatient Hospital Stay: Payer: 59 | Attending: Oncology | Admitting: Oncology

## 2018-01-29 VITALS — BP 137/86 | HR 66 | Temp 97.7°F | Resp 18 | Ht 66.0 in | Wt 164.8 lb

## 2018-01-29 DIAGNOSIS — D696 Thrombocytopenia, unspecified: Secondary | ICD-10-CM | POA: Diagnosis not present

## 2018-01-29 DIAGNOSIS — D709 Neutropenia, unspecified: Secondary | ICD-10-CM

## 2018-01-29 DIAGNOSIS — D72819 Decreased white blood cell count, unspecified: Secondary | ICD-10-CM | POA: Diagnosis not present

## 2018-01-29 NOTE — Telephone Encounter (Signed)
Scheduled appt per 3/20 los - Gave patient AVS and calender per los.

## 2018-01-29 NOTE — Progress Notes (Signed)
Reason for Referral: Leukocytopenia with neutropenia.  HPI: Randy Merritt is a pleasant 77 year old gentleman without any significant comorbid conditions.  He has history of recurrent arthritis with multiple neck operations including cervical discectomy and fusion in 2014.  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  These findings dating back to 2011 with his white cell count was 3.9.  In 2014 his white cell count was 3.7.  His white cell count normalized in 2015 and in 2017 his white cell count was 3.1.  A CBC obtained by his primary care physician in February 2019 showed a white cell count of 2.6, hemoglobin of 13 and a platelet count of 135.  His neutrophil percentage was 36.4 and a lymphocyte percentage was 50.9.  He is completely asymptomatic at this time.  He denies any lymphadenopathy, fevers or constitutional symptoms.  He denies any history of autoimmune arthritis or recurrent infections.  His performance status and activity level remain excellent.   He does not report any headaches, blurry vision, syncope or seizures. Does not report any fevers, chills or sweats.  Does not report any cough, wheezing or hemoptysis.  Does not report any chest pain, palpitation, orthopnea or leg edema.  Does not report any nausea, vomiting or abdominal pain.  Does not report any constipation or diarrhea.  Does not report any skeletal complaints.    Does not report frequency, urgency or hematuria.  Does not report any skin rashes or lesions. Does not report any heat or cold intolerance.  Does not report any lymphadenopathy or petechiae.  Does not report any anxiety or depression.  Remaining review of systems is negative.    Past Medical History:  Diagnosis Date  . Acid reflux   . Anxiety   . Arthritis   . Depression    " a little"  . High cholesterol   . History of blood transfusion   . Seasonal allergies   :  Past Surgical History:  Procedure Laterality Date  . ANTERIOR  CERVICAL DECOMP/DISCECTOMY FUSION N/A 04/08/2013   Procedure: ANTERIOR CERVICAL DECOMPRESSION/DISCECTOMY FUSION 1 LEVEL;  Surgeon: Floyce Stakes, MD;  Location: [facility];  Service: Neurosurgery;  Laterality: N/A;  Cervical five-six Anterior cervical decompression/diskectomy fusion  . CERVICAL DISCECTOMY     x2  . CHOLECYSTECTOMY    . gallstone removal    :   Current Outpatient Medications:  .  alfuzosin (UROXATRAL) 10 MG 24 hr tablet, Take 10 mg by mouth daily., Disp: , Rfl: 1 .  aspirin EC 81 MG tablet, Take 81 mg by mouth daily., Disp: , Rfl:  .  atorvastatin (LIPITOR) 40 MG tablet, Take 40 mg by mouth daily., Disp: , Rfl:  .  chlordiazePOXIDE (LIBRIUM) 25 MG capsule, Take 25 mg by mouth 3 (three) times daily as needed. anxiety, Disp: , Rfl: 0 .  finasteride (PROSCAR) 5 MG tablet, Take 5 mg by mouth at bedtime. , Disp: , Rfl:  .  fluticasone (FLONASE) 50 MCG/ACT nasal spray, Place 1 spray into both nostrils daily as needed. For sinus congestion, Disp: , Rfl: 0 .  FLUZONE HIGH-DOSE 0.5 ML injection, inject 0.5 milliliter intramuscularly, Disp: , Rfl: 0 .  ipratropium (ATROVENT) 0.03 % nasal spray, , Disp: , Rfl: 0 .  pantoprazole (PROTONIX) 40 MG tablet, Take 40 mg by mouth at bedtime. , Disp: , Rfl:  .  traZODone (DESYREL) 50 MG tablet, Take 50 mg by mouth at bedtime., Disp: , Rfl: 0:  No Known Allergies:  No family  history on file.:  Social History   Socioeconomic History  . Marital status: Married    Spouse name: Not on file  . Number of children: Not on file  . Years of education: Not on file  . Highest education level: Not on file  Social Needs  . Financial resource strain: Not on file  . Food insecurity - worry: Not on file  . Food insecurity - inability: Not on file  . Transportation needs - medical: Not on file  . Transportation needs - non-medical: Not on file  Occupational History  . Not on file  Tobacco Use  . Smoking status: Former Research scientist (life sciences)  . Smokeless  tobacco: Never Used  Substance and Sexual Activity  . Alcohol use: No  . Drug use: No  . Sexual activity: Not on file  Other Topics Concern  . Not on file  Social History Narrative  . Not on file  :  Pertinent items are noted in HPI.  Exam: Blood pressure 137/86, pulse 66, temperature 97.7 F (36.5 C), temperature source Oral, resp. rate 18, height _0  (1.676 m), weight 164 lb 12.8 oz (74.8 kg), SpO2 100 %.  ECOG 0   General appearance: alert and cooperative appeared without distress. Head: atraumatic without any abnormalities. Eyes: conjunctivae/corneas clear. PERRL.  Sclera anicteric. Throat: lips, mucosa, and tongue normal; without oral thrush or ulcers. Resp: clear to auscultation bilaterally without rhonchi, wheezes or dullness to percussion. Cardio: regular rate and rhythm, S1, S2 normal, no murmur, click, rub or gallop GI: soft, non-tender; bowel sounds normal; no masses,  no organomegaly Skin: Skin color, texture, turgor normal. No rashes or lesions Lymph nodes: Cervical, supraclavicular, and axillary nodes normal. Neurologic: Grossly normal without any motor, sensory or deep tendon reflexes. Musculoskeletal:   Joint deformities noted in his hands.     Assessment and Plan:   77 year old gentleman with the following issues:  1.  Leukocytopenia: These findings dating back to 2011 although it has been fluctuating back to normal range in 2017 and in 2019 his white cell count was 2.6.  His differential did show slight neutropenia and lymphocytosis.  The differential diagnosis was discussed today with the patient and his wife.  These findings likely support the diagnosis of benign fluctuating leukopenia and neutropenia.  Lymphoproliferative disorder is always a consideration.  Conditions such as CLL, mental cell lymphoma among others are considered less likely.  Autoimmune phenomenon is also a possibility.  He is completely asymptomatic from these findings.  I recommended  continued active surveillance and repeat laboratory testing in 6 months.  Bone marrow biopsy would be recommended if other abnormalities suggest a blood disorder.  He is not risk of any opportunistic infections at this time.  2.  Thrombocytopenia: Very mild close to normal range.  We will continue to monitor this as well.  3.  Follow-up: We will be in 6 months to repeat laboratory testing.  30  minutes was spent with the patient face-to-face today.  More than 50% of time was dedicated to patient counseling, education and answering questions regarding his diagnosis and future management plans.

## 2018-02-10 ENCOUNTER — Other Ambulatory Visit: Payer: Self-pay

## 2018-02-10 ENCOUNTER — Ambulatory Visit (HOSPITAL_COMMUNITY)
Admission: EM | Admit: 2018-02-10 | Discharge: 2018-02-10 | Disposition: A | Discharge status: Home / Self Care | Payer: 59 | Attending: Family Medicine | Admitting: Family Medicine

## 2018-02-10 ENCOUNTER — Encounter (HOSPITAL_COMMUNITY): Payer: Self-pay | Admitting: Emergency Medicine

## 2018-02-10 DIAGNOSIS — J302 Other seasonal allergic rhinitis: Secondary | ICD-10-CM

## 2018-02-10 MED ORDER — IPRATROPIUM BROMIDE 0.06 % NA SOLN: 2.0000 | Freq: Two times a day (BID) | NASAL | 12 refills | Status: DC

## 2018-02-10 MED ORDER — TRIAMCINOLONE ACETONIDE 40 MG/ML IJ SUSP
40.0000 mg | Freq: Once | INTRAMUSCULAR | Status: AC
  Administered 2018-02-10: 40 mg via INTRAMUSCULAR

## 2018-02-10 MED ORDER — CETIRIZINE HCL 10 MG PO TABS: 10.0000 mg | ORAL_TABLET | Freq: Every day | ORAL | 0 refills | Status: DC

## 2018-02-10 MED ORDER — TRIAMCINOLONE ACETONIDE 40 MG/ML IJ SUSP
INTRAMUSCULAR | Status: AC
  Filled 2018-02-10: qty 1

## 2018-02-10 NOTE — ED Triage Notes (Signed)
C/o rhinitis with head congestion

## 2018-02-10 NOTE — ED Provider Notes (Signed)
[place]    CSN: [id number] Arrival date & time: 02/10/18  3614     History   Chief Complaint Chief Complaint  Patient presents with  . Cough    HPI Randy Merritt is a 77 y.o. male.   Randy Merritt presents with complaints of runny nose and post nasal drip which has been ongoing for the past three week. States causes a cough at night. Yesterday had to cough up and spit mucus. History of reflux , seasonal allergies, dysphagia. States he has intermittently been using a nasal spray as well as occasionally taking allergy medication. Causes a headache at times. Mild scratchy throat. Without ear pain.    ROS per HPI.      Past Medical History:  Diagnosis Date  . Acid reflux   . Anxiety   . Arthritis   . Depression    " a little"  . High cholesterol   . History of blood transfusion   . Seasonal allergies     Patient Active Problem List   Diagnosis Date Noted  . Ingrown toenail 10/11/2017  . 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 Oropharyngeal dysphagia 02/02/2017  . Chronic rhinitis 02/01/2017  . Gastroesophageal reflux disease 02/01/2017    Past Surgical History:  Procedure Laterality Date  . ANTERIOR CERVICAL DECOMP/DISCECTOMY FUSION N/A 04/08/2013   Procedure: ANTERIOR CERVICAL DECOMPRESSION/DISCECTOMY FUSION 1 LEVEL;  Surgeon: Floyce Stakes, MD;  Location: [facility];  Service: Neurosurgery;  Laterality: N/A;  Cervical five-six Anterior cervical decompression/diskectomy fusion  . CERVICAL DISCECTOMY     x2  . CHOLECYSTECTOMY    . gallstone removal         Home Medications    Prior to Admission medications   Medication Sig Start Date End Date Taking? Authorizing Provider  alfuzosin (UROXATRAL) 10 MG 24 hr tablet Take 10 mg by mouth daily. 04/19/16   [provider]  aspirin EC 81 MG tablet Take 81 mg by mouth daily.    [provider]  atorvastatin (LIPITOR) 40 MG tablet Take 40 mg by mouth daily.     [provider]  cetirizine (ZYRTEC) 10 MG tablet Take 1 tablet (10 mg total) by mouth daily. 02/10/18   Zigmund Gottron, NP  chlordiazePOXIDE (LIBRIUM) 25 MG capsule Take 25 mg by mouth 3 (three) times daily as needed. anxiety 04/02/16   [provider]  finasteride (PROSCAR) 5 MG tablet Take 5 mg by mouth at bedtime.     [provider]  fluticasone (FLONASE) 50 MCG/ACT nasal spray Place 1 spray into both nostrils daily as needed. For sinus congestion 02/28/16   [provider]  FLUZONE HIGH-DOSE 0.5 ML injection inject 0.5 milliliter intramuscularly 07/09/17   [provider]  ipratropium (ATROVENT) 0.06 % nasal spray Place 2 sprays into both nostrils 2 (two) times daily. 02/10/18   Zigmund Gottron, NP  pantoprazole (PROTONIX) 40 MG tablet Take 40 mg by mouth at bedtime.     [provider]  traZODone (DESYREL) 50 MG tablet Take 50 mg by mouth at bedtime. 10/23/17   [provider]    Family History No family history on file.  Social History Social History   Tobacco Use  . Smoking status: Former Research scientist (life sciences)  . Smokeless tobacco: Never Used  Substance Use Topics  . Alcohol use: No  . Drug use: No     Allergies   Patient has no known allergies.   Review of Systems Review of  Systems   Physical Exam Triage Vital Signs ED Triage Vitals [02/10/18 1015]  Enc Vitals Group     BP (!) 163/68     Pulse Rate 64     Resp      Temp 98.4 F (36.9 C)     Temp Source Oral     SpO2 100 %     Weight      Height      Head Circumference      Peak Flow      Pain Score      Pain Loc      Pain Edu?      Excl. in [place]?    No data found.  Updated Vital Signs BP (!) 163/68 (BP Location: Left Arm)   Pulse 64   Temp 98.4 F (36.9 C) (Oral)   SpO2 100%   Visual Acuity Right Eye Distance:   Left Eye Distance:   Bilateral Distance:    Right Eye Near:   Left Eye Near:    Bilateral Near:     Physical Exam  Constitutional: He  is oriented to person, place, and time. He appears well-developed and well-nourished.  HENT:  Head: Normocephalic and atraumatic.  Right Ear: Tympanic membrane, external ear and ear canal normal.  Left Ear: Tympanic membrane, external ear and ear canal normal.  Nose: Nose normal. Right sinus exhibits no maxillary sinus tenderness and no frontal sinus tenderness. Left sinus exhibits no maxillary sinus tenderness and no frontal sinus tenderness.  Mouth/Throat: Uvula is midline, oropharynx is clear and moist and mucous membranes are normal. No tonsillar exudate.  Eyes: Pupils are equal, round, and reactive to light. Conjunctivae are normal.  Neck: Normal range of motion.  Cardiovascular: Normal rate and regular rhythm.  Pulmonary/Chest: Effort normal and breath sounds normal.  Without cough throughout exam   Lymphadenopathy:    He has no cervical adenopathy.  Neurological: He is alert and oriented to person, place, and time.  Skin: Skin is warm and dry.  Vitals reviewed.    UC Treatments / Results  Labs (all labs ordered are listed, but only abnormal results are displayed) Labs Reviewed - No data to display  EKG None Radiology No results found.  Procedures Procedures (including critical care time)  Medications Ordered in UC Medications  triamcinolone acetonide (KENALOG-40) injection 40 mg (has no administration in time range)     Initial Impression / Assessment and Plan / UC Course  I have reviewed the triage vital signs and the nursing notes.  Pertinent labs & imaging results that were available during my care of the patient were reviewed by me and considered in my medical decision making (see chart for details).     Without acute findings on exam at this time. Appears consistent with seasonal allergic rhinitis. atrovent 2-4 times a day. Daily zyrtec. Kenalog IM provided today. Encouraged follow up with PCP in the next 1-2 weeks for recheck. Patient verbalized  understanding and agreeable to plan.    Final Clinical Impressions(s) / UC Diagnoses   Final diagnoses:  Seasonal allergic rhinitis, unspecified trigger    ED Discharge Orders        Ordered    ipratropium (ATROVENT) 0.06 % nasal spray  2 times daily     02/10/18 1036    cetirizine (ZYRTEC) 10 MG tablet  Daily     02/10/18 1036       Controlled Substance Prescriptions [place] Controlled Substance Registry consulted? Not Applicable   Augusto Gamble  B, NP 02/10/18 1041

## 2018-02-10 NOTE — ED Triage Notes (Signed)
C/o dry cough at PM and having mucus "in throat" onset 3-4 weeks.  Hx of acid reflux.

## 2018-02-10 NOTE — Discharge Instructions (Addendum)
Push fluids to ensure adequate hydration and keep secretions thin.  Daily zyrtec. Use of Ipratropium nasal spray 2-4 times a day for congestion. If symptoms worsen or do not improve in the next week to return to be seen or to follow up with your primary care provider.

## 2018-02-25 ENCOUNTER — Telehealth: Payer: Self-pay | Admitting: Gastroenterology

## 2018-02-27 NOTE — Telephone Encounter (Signed)
Randy Merritt notified.

## 2018-04-14 DIAGNOSIS — M19041 Primary osteoarthritis, right hand: Secondary | ICD-10-CM | POA: Insufficient documentation

## 2018-04-14 DIAGNOSIS — Z86010 Personal history of colonic polyps: Secondary | ICD-10-CM | POA: Insufficient documentation

## 2018-04-14 DIAGNOSIS — N400 Enlarged prostate without lower urinary tract symptoms: Secondary | ICD-10-CM | POA: Insufficient documentation

## 2018-04-14 DIAGNOSIS — F419 Anxiety disorder, unspecified: Secondary | ICD-10-CM | POA: Insufficient documentation

## 2018-04-14 DIAGNOSIS — N183 Chronic kidney disease, stage 3 unspecified: Secondary | ICD-10-CM | POA: Insufficient documentation

## 2018-04-14 DIAGNOSIS — E785 Hyperlipidemia, unspecified: Secondary | ICD-10-CM | POA: Insufficient documentation

## 2018-04-14 DIAGNOSIS — Z860101 Personal history of adenomatous and serrated colon polyps: Secondary | ICD-10-CM | POA: Insufficient documentation

## 2018-07-28 ENCOUNTER — Telehealth: Payer: Self-pay | Admitting: Oncology

## 2018-07-28 NOTE — Telephone Encounter (Signed)
patient called to cancel. He said he will call back to reschedule

## 2018-07-30 ENCOUNTER — Other Ambulatory Visit: Payer: 59

## 2018-07-30 ENCOUNTER — Ambulatory Visit: Payer: 59 | Admitting: Oncology

## 2018-08-06 ENCOUNTER — Encounter (HOSPITAL_COMMUNITY): Payer: Self-pay | Admitting: Emergency Medicine

## 2018-08-06 ENCOUNTER — Ambulatory Visit (HOSPITAL_COMMUNITY)
Admission: EM | Admit: 2018-08-06 | Discharge: 2018-08-06 | Disposition: A | Discharge status: Home / Self Care | Payer: 59 | Attending: Family Medicine | Admitting: Family Medicine

## 2018-08-06 DIAGNOSIS — M62838 Other muscle spasm: Secondary | ICD-10-CM

## 2018-08-06 MED ORDER — NAPROXEN 375 MG PO TABS: 375.0000 mg | ORAL_TABLET | Freq: Two times a day (BID) | ORAL | 0 refills | Status: AC

## 2018-08-06 NOTE — ED Triage Notes (Signed)
PT reports lower back pain for a while. PT woke up with right shoulder pain that radiates to neck this AM

## 2018-08-06 NOTE — ED Provider Notes (Signed)
[name]   [id number] 08/06/18 Arrival Time: 3532  CC: Shoulder pain  SUBJECTIVE: History from: patient. Randy Merritt is a 77 y.o. male complains of right shoulder pain that began this morning.  Denies a precipitating event or specific injury.  Localizes the pain to the superior aspect of shoulder.  Describes the pain as constant and 8/10.  Has tried OTC medications without relief.  Symptoms are made worse with shoulder shrug.  Denies similar symptoms in the past.  Denies fever, chills, erythema, ecchymosis, effusion, weakness, numbness and tingling.      ROS: As per HPI.  Past Medical History:  Diagnosis Date  . Acid reflux   . Anxiety   . Arthritis   . Depression    " a little"  . High cholesterol   . History of blood transfusion   . Seasonal allergies    Past Surgical History:  Procedure Laterality Date  . ANTERIOR CERVICAL DECOMP/DISCECTOMY FUSION N/A 04/08/2013   Procedure: ANTERIOR CERVICAL DECOMPRESSION/DISCECTOMY FUSION 1 LEVEL;  Surgeon: Floyce Stakes, MD;  Location: [facility];  Service: Neurosurgery;  Laterality: N/A;  Cervical five-six Anterior cervical decompression/diskectomy fusion  . CERVICAL DISCECTOMY     x2  . CHOLECYSTECTOMY    . gallstone removal     No Known Allergies No current facility-administered medications on file prior to encounter.    Current Outpatient Medications on File Prior to Encounter  Medication Sig Dispense Refill  . aspirin EC 81 MG tablet Take 81 mg by mouth daily.    Marland Kitchen atorvastatin (LIPITOR) 40 MG tablet Take 40 mg by mouth daily.    . cetirizine (ZYRTEC) 10 MG tablet Take 1 tablet (10 mg total) by mouth daily. 30 tablet 0  . chlordiazePOXIDE (LIBRIUM) 25 MG capsule Take 25 mg by mouth 3 (three) times daily as needed. anxiety  0  . finasteride (PROSCAR) 5 MG tablet Take 5 mg by mouth at bedtime.     . fluticasone (FLONASE) 50 MCG/ACT nasal spray Place 1 spray into both nostrils daily as needed. For sinus  congestion  0  . ipratropium (ATROVENT) 0.06 % nasal spray Place 2 sprays into both nostrils 2 (two) times daily. 15 mL 12  . pantoprazole (PROTONIX) 40 MG tablet Take 40 mg by mouth at bedtime.     Marland Kitchen alfuzosin (UROXATRAL) 10 MG 24 hr tablet Take 10 mg by mouth daily.  1  . FLUZONE HIGH-DOSE 0.5 ML injection inject 0.5 milliliter intramuscularly  0  . traZODone (DESYREL) 50 MG tablet Take 50 mg by mouth at bedtime.  0   Social History   Socioeconomic History  . Marital status: Married    Spouse name: Not on file  . Number of children: Not on file  . Years of education: Not on file  . Highest education level: Not on file  Occupational History  . Not on file  Social Needs  . Financial resource strain: Not on file  . Food insecurity:    Worry: Not on file    Inability: Not on file  . Transportation needs:    Medical: Not on file    Non-medical: Not on file  Tobacco Use  . Smoking status: Former Research scientist (life sciences)  . Smokeless tobacco: Never Used  Substance and Sexual Activity  . Alcohol use: No  . Drug use: No  . Sexual activity: Not on file  Lifestyle  . Physical activity:    Days per week: Not on file    Minutes per  session: Not on file  . Stress: Not on file  Relationships  . Social connections:    Talks on phone: Not on file    Gets together: Not on file    Attends religious service: Not on file    Active member of club or organization: Not on file    Attends meetings of clubs or organizations: Not on file    Relationship status: Not on file  . Intimate partner violence:    Fear of current or ex partner: Not on file    Emotionally abused: Not on file    Physically abused: Not on file    Forced sexual activity: Not on file  Other Topics Concern  . Not on file  Social History Narrative  . Not on file   No family history on file.  OBJECTIVE:  Vitals:   08/06/18 0902 08/06/18 0905  BP:  (!) 148/73  Pulse:  (!) 57  Resp:  16  Temp:  98.4 F (36.9 C)  TempSrc:  Oral    SpO2:  99%  Weight: 150 lb (68 kg)     General appearance: AOx3; in no acute distress.  Head: NCAT Lungs: CTA bilaterally Heart: RRR.  Clear S1 and S2 without murmur, gallops, or rubs.  Radial pulses 2+ bilaterally. Musculoskeletal: Right shoulder Inspection: Skin warm, dry, clear and intact without obvious erythema, effusion, or ecchymosis.  Palpation: Tender to palpation over the superior edge of the trapezius, palpable spasm in comparison to left trapezius ROM: FROM active and passive Strength: 5/5 shld abduction, 5/5 shld adduction, 5/5 elbow flexion, 5/5 elbow extension, 5/5 grip strength Skin: warm and dry Neurologic: Ambulates without difficulty; Sensation intact about the upper extremities Psychological: alert and cooperative; normal mood and affect  ASSESSMENT & PLAN:  1. Trapezius muscle spasm     Meds ordered this encounter  Medications  . naproxen (NAPROSYN) 375 MG tablet    Sig: Take 1 tablet (375 mg total) by mouth 2 (two) times daily for 3 days.    Dispense:  6 tablet    Refill:  0    Order Specific Question:   Supervising Provider    Answer:   Wynona Luna [[id number]]    Continue conservative management of rest, ice, and gentle stretches Take naproxen as needed for pain relief (may cause abdominal discomfort, ulcers, and GI bleeds avoid taking with other NSAIDs) Follow up with PCP if symptoms persists Return or go to the ER if you have any new or worsening symptoms (fever, chills, chest pain, abdominal pain, changes in bowel or bladder habits, pain radiating into lower legs, etc...)   Reviewed expectations re: course of current medical issues. Questions answered. Outlined signs and symptoms indicating need for more acute intervention. Patient verbalized understanding. After Visit Summary given.    Lestine Box, PA-C 08/06/18 1436

## 2018-08-06 NOTE — Discharge Instructions (Signed)
Continue conservative management of rest, ice, heat, and gentle stretches Take naproxen as needed for pain relief (may cause abdominal discomfort, ulcers, and GI bleeds avoid taking with other NSAIDs) Follow up with PCP if symptoms persists Return or go to the ER if you have any new or worsening symptoms (fever, chills, chest pain, abdominal pain, changes in bowel or bladder habits, pain radiating into lower legs, etc...)

## 2018-08-08 ENCOUNTER — Inpatient Hospital Stay: Payer: 59 | Attending: Oncology

## 2018-08-08 ENCOUNTER — Inpatient Hospital Stay (HOSPITAL_BASED_OUTPATIENT_CLINIC_OR_DEPARTMENT_OTHER): Payer: 59 | Admitting: Oncology

## 2018-08-08 VITALS — BP 151/88 | HR 60 | Temp 97.6°F | Resp 17 | Ht 66.0 in | Wt 160.9 lb

## 2018-08-08 DIAGNOSIS — D72819 Decreased white blood cell count, unspecified: Secondary | ICD-10-CM | POA: Diagnosis not present

## 2018-08-08 DIAGNOSIS — D709 Neutropenia, unspecified: Secondary | ICD-10-CM

## 2018-08-08 DIAGNOSIS — D696 Thrombocytopenia, unspecified: Secondary | ICD-10-CM | POA: Diagnosis not present

## 2018-08-08 LAB — CBC WITH DIFFERENTIAL (CANCER CENTER ONLY)
Basophils Absolute: 0 10*3/uL (ref 0.0–0.1)
Basophils Relative: 0 %
Eosinophils Absolute: 0.1 10*3/uL (ref 0.0–0.5)
Eosinophils Relative: 4 %
HCT: 38.7 % (ref 38.4–49.9)
Hemoglobin: 12.8 g/dL — ABNORMAL LOW (ref 13.0–17.1)
Lymphocytes Relative: 45 %
Lymphs Abs: 1.3 10*3/uL (ref 0.9–3.3)
MCH: 31.8 pg (ref 27.2–33.4)
MCHC: 33 g/dL (ref 32.0–36.0)
MCV: 96.3 fL (ref 79.3–98.0)
Monocytes Absolute: 0.3 10*3/uL (ref 0.1–0.9)
Monocytes Relative: 11 %
Neutro Abs: 1.1 10*3/uL — ABNORMAL LOW (ref 1.5–6.5)
Neutrophils Relative %: 40 %
Platelet Count: 129 10*3/uL — ABNORMAL LOW (ref 140–400)
RBC: 4.02 MIL/uL — ABNORMAL LOW (ref 4.20–5.82)
RDW: 13.7 % (ref 11.0–14.6)
WBC Count: 2.9 10*3/uL — ABNORMAL LOW (ref 4.0–10.3)

## 2018-08-08 LAB — CMP (CANCER CENTER ONLY)
ALT: 19 U/L (ref 0–44)
AST: 21 U/L (ref 15–41)
Albumin: 4.4 g/dL (ref 3.5–5.0)
Alkaline Phosphatase: 107 U/L (ref 38–126)
Anion gap: 8 (ref 5–15)
BUN: 14 mg/dL (ref 8–23)
CO2: 30 mmol/L (ref 22–32)
Calcium: 9.6 mg/dL (ref 8.9–10.3)
Chloride: 107 mmol/L (ref 98–111)
Creatinine: 1.45 mg/dL — ABNORMAL HIGH (ref 0.61–1.24)
GFR, Est AFR Am: 52 mL/min — ABNORMAL LOW (ref >60)
GFR, Estimated: 45 mL/min — ABNORMAL LOW (ref >60)
Glucose, Bld: 80 mg/dL (ref 70–99)
Potassium: 5 mmol/L (ref 3.5–5.1)
Sodium: 145 mmol/L (ref 135–145)
Total Bilirubin: 0.5 mg/dL (ref 0.3–1.2)
Total Protein: 7 g/dL (ref 6.5–8.1)

## 2018-08-08 LAB — SAVE SMEAR

## 2018-08-08 NOTE — Progress Notes (Signed)
Hematology and Oncology Follow Up Visit  Randy Merritt [id number] 1941-05-15 77 y.o. 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  The differential diagnosis includes reactive and fluctuating benign leukocytopenia versus autoimmune etiology.    Current therapy: Active surveillance  Interim History: Mr. Mcjunkins presents today for a follow-up visit.  Since last visit, he reports no major changes or complaints.  He was seen in the emergency department on 08/06/2018 because of shoulder pain which has improved at this time.  He denies any recurrent infections or hospitalizations.  He does have joint deformity in his hands which is chronic in nature.  He denies any joint stiffness or swelling however.  He denies any active bleeding.  He does not report any headaches, blurry vision, syncope or seizures. Does not report any fevers, chills or sweats.  Does not report any cough, wheezing or hemoptysis.  Does not report any chest pain, palpitation, orthopnea or leg edema.  Does not report any nausea, vomiting or abdominal pain.  Does not report any constipation or diarrhea.    Does not report frequency, urgency or hematuria.  Does not report any skin rashes or lesions. Does not report any heat or cold intolerance.  Does not report any lymphadenopathy or petechiae.  Does not report any anxiety or depression.  Remaining review of systems is negative.    Medications: I have reviewed the patient's current medications.  Current Outpatient Medications  Medication Sig Dispense Refill  . alfuzosin (UROXATRAL) 10 MG 24 hr tablet Take 10 mg by mouth daily.  1  . aspirin EC 81 MG tablet Take 81 mg by mouth daily.    Marland Kitchen atorvastatin (LIPITOR) 40 MG tablet Take 40 mg by mouth daily.    . cetirizine (ZYRTEC) 10 MG tablet Take 1 tablet (10 mg total) by mouth daily. 30 tablet 0  . chlordiazePOXIDE (LIBRIUM) 25 MG capsule Take 25 mg by  mouth 3 (three) times daily as needed. anxiety  0  . finasteride (PROSCAR) 5 MG tablet Take 5 mg by mouth at bedtime.     . fluticasone (FLONASE) 50 MCG/ACT nasal spray Place 1 spray into both nostrils daily as needed. For sinus congestion  0  . FLUZONE HIGH-DOSE 0.5 ML injection inject 0.5 milliliter intramuscularly  0  . ipratropium (ATROVENT) 0.06 % nasal spray Place 2 sprays into both nostrils 2 (two) times daily. 15 mL 12  . naproxen (NAPROSYN) 375 MG tablet Take 1 tablet (375 mg total) by mouth 2 (two) times daily for 3 days. 6 tablet 0  . pantoprazole (PROTONIX) 40 MG tablet Take 40 mg by mouth at bedtime.     . traZODone (DESYREL) 50 MG tablet Take 50 mg by mouth at bedtime.  0   No current facility-administered medications for this visit.      Allergies: No Known Allergies  Past Medical History, Surgical history, Social history, and Family History were reviewed and updated.    Physical Exam: Blood pressure (!) 151/88, pulse 60, temperature 97.6 F (36.4 C), temperature source Oral, resp. rate 17, height 5\' 6"  (1.676 m), weight 160 lb 14.4 oz (73 kg), SpO2 100 %.   ECOG: 0 General appearance: alert and cooperative appeared without distress. Head: Normocephalic, without obvious abnormality Oropharynx: No oral thrush or ulcers. Eyes: No scleral icterus.  Pupils are equal and round reactive to light. Lymph nodes: Cervical, supraclavicular, and axillary nodes normal. Heart:regular rate and rhythm, S1, S2 normal, no  murmur, click, rub or gallop Lung:chest clear, no wheezing, rales, normal symmetric air entry Abdomin: soft, non-tender, without masses or organomegaly. Neurological: No motor, sensory deficits.  Intact deep tendon reflexes. Skin: No rashes or lesions.  No ecchymosis or petechiae. Musculoskeletal: Bilateral joint deformity noted in his PIP and DIP in his hands.  No wrist swelling.  Full range of motion in his shoulder and his neck.     Lab Results: Lab Results   Component Value Date   WBC 2.9 (L) 08/08/2018   HGB 12.8 (L) 08/08/2018   HCT 38.7 08/08/2018   MCV 96.3 08/08/2018   PLT 129 (L) 08/08/2018     Chemistry      Component Value Date/Time   NA 142 04/28/2016 0913   K 4.5 04/28/2016 0913   CL 107 04/28/2016 0913   CO2 28 04/28/2016 0913   BUN 13 04/28/2016 0913   CREATININE 1.53 (H) 04/28/2016 0913      Component Value Date/Time   CALCIUM 9.6 04/28/2016 0913   ALKPHOS 112 02/10/2014 1027   AST 23 02/10/2014 1027   ALT 16 02/10/2014 1027   BILITOT 0.4 02/10/2014 1027      Impression and Plan:   77 year old man with the following:  1.  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  His differential continues to be normal and has not changed for many years.  His CBC was reviewed today and showed a total white cell count of 2.9 which is unchanged from 2017.  The differential diagnosis was reviewed again with the patient.  His leukocytopenia appears to be benign and fluctuating with normal differential.  I think variation versus autoimmune etiologies could be a contributing factor.  Lymphoproliferative disorder, myelodysplastic syndrome are considered less likely.  2.  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  See no evidence to suggest a blood disorder at this time.  3.  Joint deformity: There is unclear if he has rheumatoid arthritis which could be contributing to his leukocytopenia as well as joint deformity.  Rheumatological evaluation may be helpful in the future.  4.  Follow-up: I see no need for further hematological evaluation at this time.  I am happy to see him in the future as needed.  15  minutes was spent with the patient face-to-face today.  More than 50% of time was dedicated to reviewing the natural course of his disease, differential diagnosis and coordinating plan of care.    Zola Button, MD 9/27/20192:23 PM

## 2018-08-12 ENCOUNTER — Telehealth: Payer: Self-pay

## 2018-08-12 NOTE — Telephone Encounter (Signed)
Per 9/27 no los

## 2018-12-08 ENCOUNTER — Telehealth: Payer: Self-pay

## 2018-12-08 NOTE — Telephone Encounter (Signed)
Received call from patient requesting future appointment information. Informed patient that he has no future appointments with Dr. Alen Blew as he does not need regular follow up since the 9/19 office visit and that he he just needs to continue with his PCP. Patient stated that he saw his PCP 2 weeks ago and had no other questions or concerns.

## 2018-12-20 ENCOUNTER — Emergency Department (HOSPITAL_COMMUNITY): Payer: 59

## 2018-12-20 ENCOUNTER — Emergency Department (HOSPITAL_COMMUNITY)
Admission: EM | Admit: 2018-12-20 | Discharge: 2018-12-20 | Disposition: A | Discharge status: Home / Self Care | Payer: 59 | Attending: Emergency Medicine | Admitting: Emergency Medicine

## 2018-12-20 ENCOUNTER — Other Ambulatory Visit: Payer: Self-pay

## 2018-12-20 ENCOUNTER — Encounter (HOSPITAL_COMMUNITY): Payer: Self-pay | Admitting: Student

## 2018-12-20 DIAGNOSIS — Z79899 Other long term (current) drug therapy: Secondary | ICD-10-CM | POA: Insufficient documentation

## 2018-12-20 DIAGNOSIS — R1310 Dysphagia, unspecified: Secondary | ICD-10-CM | POA: Diagnosis present

## 2018-12-20 DIAGNOSIS — Z87891 Personal history of nicotine dependence: Secondary | ICD-10-CM | POA: Insufficient documentation

## 2018-12-20 DIAGNOSIS — Z7982 Long term (current) use of aspirin: Secondary | ICD-10-CM | POA: Insufficient documentation

## 2018-12-20 LAB — COMPREHENSIVE METABOLIC PANEL
ALT: 23 U/L (ref 0–44)
AST: 24 U/L (ref 15–41)
Albumin: 4.2 g/dL (ref 3.5–5.0)
Alkaline Phosphatase: 78 U/L (ref 38–126)
Anion gap: 14 (ref 5–15)
BUN: 20 mg/dL (ref 8–23)
CO2: 23 mmol/L (ref 22–32)
Calcium: 9.6 mg/dL (ref 8.9–10.3)
Chloride: 108 mmol/L (ref 98–111)
Creatinine, Ser: 1.41 mg/dL — ABNORMAL HIGH (ref 0.61–1.24)
GFR calc Af Amer: 55 mL/min — ABNORMAL LOW (ref >60)
GFR calc non Af Amer: 47 mL/min — ABNORMAL LOW (ref >60)
Glucose, Bld: 92 mg/dL (ref 70–99)
Potassium: 4 mmol/L (ref 3.5–5.1)
Sodium: 145 mmol/L (ref 135–145)
Total Bilirubin: 1 mg/dL (ref 0.3–1.2)
Total Protein: 6.8 g/dL (ref 6.5–8.1)

## 2018-12-20 LAB — URINALYSIS, ROUTINE W REFLEX MICROSCOPIC
Bilirubin Urine: NEGATIVE
Glucose, UA: NEGATIVE mg/dL
Hgb urine dipstick: NEGATIVE
Ketones, ur: 5 mg/dL — AB
Leukocytes, UA: NEGATIVE
Nitrite: NEGATIVE
Protein, ur: NEGATIVE mg/dL
Specific Gravity, Urine: 1.017 (ref 1.005–1.030)
pH: 7 (ref 5.0–8.0)

## 2018-12-20 LAB — CBC WITH DIFFERENTIAL/PLATELET
Abs Immature Granulocytes: 0 10*3/uL (ref 0.00–0.07)
Basophils Absolute: 0 10*3/uL (ref 0.0–0.1)
Basophils Relative: 0 %
Eosinophils Absolute: 0.1 10*3/uL (ref 0.0–0.5)
Eosinophils Relative: 2 %
HCT: 39.8 % (ref 39.0–52.0)
Hemoglobin: 12.9 g/dL — ABNORMAL LOW (ref 13.0–17.0)
Immature Granulocytes: 0 %
Lymphocytes Relative: 49 %
Lymphs Abs: 1.3 10*3/uL (ref 0.7–4.0)
MCH: 30.9 pg (ref 26.0–34.0)
MCHC: 32.4 g/dL (ref 30.0–36.0)
MCV: 95.4 fL (ref 80.0–100.0)
Monocytes Absolute: 0.3 10*3/uL (ref 0.1–1.0)
Monocytes Relative: 10 %
Neutro Abs: 1 10*3/uL — ABNORMAL LOW (ref 1.7–7.7)
Neutrophils Relative %: 39 %
Platelets: 117 10*3/uL — ABNORMAL LOW (ref 150–400)
RBC: 4.17 MIL/uL — ABNORMAL LOW (ref 4.22–5.81)
RDW: 12.4 % (ref 11.5–15.5)
WBC: 2.6 10*3/uL — ABNORMAL LOW (ref 4.0–10.5)
nRBC: 0 % (ref 0.0–0.2)

## 2018-12-20 MED ORDER — IOHEXOL 300 MG/ML  SOLN
75.0000 mL | Freq: Once | INTRAMUSCULAR | Status: AC | PRN
  Administered 2018-12-20: 75 mL via INTRAVENOUS

## 2018-12-20 NOTE — Discharge Instructions (Signed)
You were seen in the ER today for trouble swallowing and breathing.  Your work-up in the emergency department showed that you have a low white blood cell count and low hemoglobin that your kidney function is elevated, this is similar to prior labs you have had done.  The CT scan of your neck showed that there is some enlargement of your thyroid and that you have some ossification of the ligaments in your neck.   We would like you to follow up with your primary care provider within 3 days. If you wish to see a new primary care provider you may call the phone number circled in your discharge instructions. Return to the ER for new or worsening symptoms or any other concerns.    IMPRESSION:  1. No acute finding or change from 2015.  2. 3 notable findings in this patient with difficulty swallowing  include goiter, diffuse idiopathic skeletal hyperostosis, and  excessive stylohyoid ligament ossification        Electronically Signed    By: Monte Fantasia M.D.    On: 12/20/2018 11:55

## 2018-12-20 NOTE — ED Triage Notes (Signed)
PT reports Diff. Swallowing and SHOB. Pt went to PCP and was told he had a pin in his throat from neck surgery. Pt has difficulty explaining his problem.

## 2018-12-20 NOTE — ED Provider Notes (Signed)
[facility] Provider Note   CSN: [id number] Arrival date & time: 12/20/18  0846   History   Chief Complaint Chief Complaint  Patient presents with  . Dysphagia    HPI Randy Merritt is a 78 y.o. male with a hx of GERD, anxiety, depression, hypercholesterolemia, and oropharyngeal dysphagia who has undergone previous esophogeal dilations and presents to the ER with multiple complaints/concerns. He states he has been having difficulty swallowing for "a long time" he states years when asked to further elaborate on this. He states he is able to tolerate solids & liquids, but that sometimes he gets regurgitation of saliva. He states this especially happens when he tried to take pills. No other specific alleviating/aggravating factors. He states that with the trouble swallowing he has had some trouble breathing as well. He states he believes this is secondary to taking ativan 0.5 mg. He states since taking this new medicine he sometimes gets short of breath after taking it- this has been going on for several months. Reports his abdomen feels bloated at times, but not painful. States there is also an area of waxing/waning swelling to the R anterior neck that has been worse recently. He mentions "pins in my neck from surgery" but cannot tell me what type of surgery this was, states it was years ago. He also mentions some increased urinary frequency which he has been told is secondary to enlarged prostate. He also mentions some R upper back pain x 1 year, worse with movement & palpation.   Denies chest pain, exertional dyspnea, leg swelling, fever, chills, nausea, URI sxs, vomiting, diarrhea, constipation, melena, or dysuria.   He is an overall poor historian.    HPI  Past Medical History:  Diagnosis Date  . Acid reflux   . Anxiety   . Arthritis   . Depression    " a little"  . High cholesterol   . History of blood transfusion   . Seasonal allergies      Patient Active Problem List   Diagnosis Date Noted  . Ingrown toenail 10/11/2017  . 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 Oropharyngeal dysphagia 02/02/2017  . Chronic rhinitis 02/01/2017  . Gastroesophageal reflux disease 02/01/2017    Past Surgical History:  Procedure Laterality Date  . ANTERIOR CERVICAL DECOMP/DISCECTOMY FUSION N/A 04/08/2013   Procedure: ANTERIOR CERVICAL DECOMPRESSION/DISCECTOMY FUSION 1 LEVEL;  Surgeon: Floyce Stakes, MD;  Location: [facility];  Service: Neurosurgery;  Laterality: N/A;  Cervical five-six Anterior cervical decompression/diskectomy fusion  . CERVICAL DISCECTOMY     x2  . CHOLECYSTECTOMY    . gallstone removal          Home Medications    Prior to Admission medications   Medication Sig Start Date End Date Taking? Authorizing Provider  alfuzosin (UROXATRAL) 10 MG 24 hr tablet Take 10 mg by mouth daily. 04/19/16   [provider]  aspirin EC 81 MG tablet Take 81 mg by mouth daily.    [provider]  atorvastatin (LIPITOR) 40 MG tablet Take 40 mg by mouth daily.    [provider]  cetirizine (ZYRTEC) 10 MG tablet Take 1 tablet (10 mg total) by mouth daily. 02/10/18   Zigmund Gottron, NP  chlordiazePOXIDE (LIBRIUM) 25 MG capsule Take 25 mg by mouth 3 (three) times daily as needed. anxiety 04/02/16   [provider]  finasteride (PROSCAR) 5 MG tablet Take 5 mg by mouth at bedtime.  [provider]  fluticasone (FLONASE) 50 MCG/ACT nasal spray Place 1 spray into both nostrils daily as needed. For sinus congestion 02/28/16   [provider]  FLUZONE HIGH-DOSE 0.5 ML injection inject 0.5 milliliter intramuscularly 07/09/17   [provider]  ipratropium (ATROVENT) 0.06 % nasal spray Place 2 sprays into both nostrils 2 (two) times daily. 02/10/18   Zigmund Gottron, NP  pantoprazole (PROTONIX) 40 MG tablet Take 40 mg by mouth at bedtime.     [provider]  traZODone (DESYREL) 50 MG tablet Take 50 mg by mouth at bedtime. 10/23/17   [provider]    Family History No family history on file.  Social History Social History   Tobacco Use  . Smoking status: Former Research scientist (life sciences)  . Smokeless tobacco: Never Used  Substance Use Topics  . Alcohol use: No  . Drug use: No     Allergies   Patient has no known allergies.   Review of Systems Review of Systems  Constitutional: Negative for chills and fever.  HENT: Negative for congestion, ear pain and sore throat.   Eyes: Negative for visual disturbance.  Respiratory: Positive for shortness of breath (w/ taking ativan). Negative for wheezing.   Cardiovascular: Negative for chest pain, palpitations and leg swelling.  Gastrointestinal: Negative for abdominal pain, blood in stool, constipation, diarrhea, nausea and vomiting.       Positive for intermittent bloating and regurgitation.   Genitourinary: Positive for frequency. Negative for dysuria, penile swelling, scrotal swelling and urgency.  Musculoskeletal: Positive for back pain.  Neurological: Negative for dizziness, seizures, syncope, facial asymmetry, weakness, light-headedness and numbness.       Negative for incontinence.   All other systems reviewed and are negative.    Physical Exam Updated Vital Signs There were no vitals taken for this visit.  Physical Exam Vitals signs and nursing note reviewed.  Constitutional:      General: He is not in acute distress.    Appearance: He is well-developed. He is not toxic-appearing.  HENT:     Head: Normocephalic and atraumatic.     Right Ear: Tympanic membrane normal.     Left Ear: Tympanic membrane normal.     Nose: Nose normal.     Mouth/Throat:     Mouth: Mucous membranes are moist.     Pharynx: Oropharynx is clear. Uvula midline. No pharyngeal swelling, oropharyngeal exudate, posterior oropharyngeal erythema or uvula swelling.     Tonsils: No tonsillar  exudate.     Comments: Posterior oropharynx is symmetric appearing. Patient tolerating own secretions without difficulty. No trismus. No drooling. No hot potato voice. No swelling beneath the tongue, submandibular compartment is soft.  Eyes:     General:        Right eye: No discharge.        Left eye: No discharge.     Conjunctiva/sclera: Conjunctivae normal.     Comments: PERRL.   Neck:     Musculoskeletal: Normal range of motion and neck supple. No neck rigidity, crepitus, spinous process tenderness or muscular tenderness.     Comments: There is an area of swelling noted to the inferior neck R>L.  No overlying skin changes- no warmth/erthema. No palpable fluctuance. The area is not overly firm. Possible thyroid mass/enlargement, somewhat difficult to discern.  Cardiovascular:     Rate and Rhythm: Normal rate and regular rhythm.  Pulmonary:     Effort: Pulmonary effort is normal. No respiratory distress.  Breath sounds: Normal breath sounds. No wheezing, rhonchi or rales.  Abdominal:     General: There is no distension.     Palpations: Abdomen is soft.     Tenderness: There is no abdominal tenderness. There is no guarding or rebound.  Musculoskeletal:     Comments: No obvious deformity, appreciable swelling, erythema, ecchymosis, significant open wounds, or increased warmth.  Extremities: Normal ROM. Nontender.  Back: No point/focal vertebral tenderness, no palpable step off or crepitus. There is an area of tenderness to palpation just below the R scapula that reproduces patient's back pain.   Skin:    General: Skin is warm and dry.     Findings: No rash.  Neurological:     Mental Status: He is alert.     Deep Tendon Reflexes:     Reflex Scores:      Patellar reflexes are 2+ on the right side and 2+ on the left side.    Comments: Sensation grossly intact to bilateral lower extremities. 5/5 symmetric strength with plantar/dorsiflexion bilaterally. Gait is intact without obvious  foot drop.   Psychiatric:        Behavior: Behavior normal.    ED Treatments / Results  Labs (all labs ordered are listed, but only abnormal results are displayed) Labs Reviewed  CBC WITH DIFFERENTIAL/PLATELET - Abnormal; Notable for the following components:      Result Value   WBC 2.6 (*)    RBC 4.17 (*)    Hemoglobin 12.9 (*)    Platelets 117 (*)    Neutro Abs 1.0 (*)    All other components within normal limits  COMPREHENSIVE METABOLIC PANEL - Abnormal; Notable for the following components:   Creatinine, Ser 1.41 (*)    GFR calc non Af Amer 47 (*)    GFR calc Af Amer 55 (*)    All other components within normal limits  URINALYSIS, ROUTINE W REFLEX MICROSCOPIC - Abnormal; Notable for the following components:   Ketones, ur 5 (*)    All other components within normal limits    EKG EKG Interpretation  Date/Time:  Saturday December 20 2018 08:59:34 EST Ventricular Rate:  81 PR Interval:    QRS Duration: 80 QT Interval:  351 QTC Calculation: 408 R Axis:   79 Text Interpretation:  Sinus rhythm No significant change since last tracing Confirmed by Dorie Rank [phone]) on 12/20/2018 9:02:04 AM   Radiology Ct Soft Tissue Neck W Contrast  Result Date: 12/20/2018 CLINICAL DATA:  Neck mass, solitary, afebrile. Difficulty swallowing and shortness of breath. EXAM: CT NECK WITH CONTRAST TECHNIQUE: Multidetector CT imaging of the neck was performed using the standard protocol following the bolus administration of intravenous contrast. CONTRAST:  64mL OMNIPAQUE IOHEXOL 300 MG/ML  SOLN COMPARISON:  02/10/2014 FINDINGS: Pharynx and larynx: No evidence of mass or swelling. Salivary glands: No inflammation, mass, or stone. Thyroid: Nodular goiter, larger on the right, with chronic mild narrowing of the trachea, stable from prior. No invasive features. Lymph nodes: None enlarged or abnormal density. Vascular: Negative. Limited intracranial: Negative Visualized orbits: Negative Mastoids and  visualized paranasal sinuses: Clear Skeleton: Diffuse idiopathic skeletal hyperostosis. There has been C3-4 and C5-6 ACDF with plate at N8-6. Spurs and the plate encroach on the upper esophagus and pharynx. Excessive ossification of the stylohyoid ligaments. Upper chest: Negative IMPRESSION: 1. No acute finding or change from 2015. 2. 3 notable findings in this patient with difficulty swallowing include goiter, diffuse idiopathic skeletal hyperostosis, and excessive stylohyoid ligament ossification Electronically  Signed   By: Monte Fantasia M.D.   On: 12/20/2018 11:55   Dg Abd Acute W/chest  Result Date: 12/20/2018 CLINICAL DATA:  Shortness of breath and vomiting EXAM: DG ABDOMEN ACUTE W/ 1V CHEST COMPARISON:  Chest radiograph January 28, 2017; CT abdomen and pelvis April 28, 2016 FINDINGS: PA chest: No edema or consolidation. Heart size and pulmonary vascularity are normal. There is leftward deviation of the upper thoracic trachea, a stable finding. There is postoperative change in the lower cervical spine. Supine and upright abdomen: There is diffuse stool throughout much of the colon. There is no bowel dilatation or air-fluid level to suggest bowel obstruction. No free air. There are surgical clips in the right upper quadrant region. IMPRESSION: Diffuse stool throughout colon. No bowel obstruction or free air evident. No edema or consolidation. Heart size normal. Stable leftward deviation of the upper thoracic trachea most likely represents thyroid enlargement along the rightward aspect. Electronically Signed   By: Lowella Grip III M.D.   On: 12/20/2018 09:54    Procedures Procedures (including critical care time)  Medications Ordered in ED Medications - No data to display   Initial Impression / Assessment and Plan / ED Course  I have reviewed the triage vital signs and the nursing notes.  Pertinent labs & imaging results that were available during my care of the patient were reviewed by me and  considered in my medical decision making (see chart for details).   Patient presents with multiple complaints which seem chronic in nature. Nontoxic, no apparent distress, vitals w/ some HTN, doubt HTN emergency. Exam is fairly benign. He does having swelling/mass noted to the R inferior anterior neck, however tolerating own secretions without difficulty, no evidence of respiratory distress. R back pain is reproducible on palpation of the area just inferior to the R scapula & w/ movement of RUE- seems musculoskeletal in nature as opposed to acute cardiopulmonary abnormality. Evaluation with labs, chest/abdomen x-ray, CT soft tissue neck, & EKG.   Work-up reviewed:  CBC: Baseline anemia. Baseline leukopenia. Being followed by hematology/oncolong.  CMP: Unremarkable. Creatinine consistent with baseline.  UA: No UTI EKG: NSR.  Chest/abdominal series: Diffuse stool throughout colon. No bowel obstruction or free air evident. No edema or consolidation. Heart size normal. Stable leftward deviation of the upper thoracic trachea most likely represents thyroid enlargement along the rightward aspect.  CT soft tissue neck w/ contrast: No acute finding or change from 2015. 3 notable findings in this patient with difficulty swallowing include goiter, diffuse idiopathic skeletal hyperostosis, and excessive stylohyoid ligament ossification  Repeat abdominal exam remains non tender without peritoneal signs. Patient has remained well appearing throughout ER stay. Tolerating PO, remains without signs of respiratory distress. No acute findings noted throughout ER work-up. Sxs have been ongoing for months. Patient appears appropriate for outpatient follow up with his PCP and strict return precautions. I discussed results, treatment plan, need for follow-up, and return precautions with the patient. Provided opportunity for questions, patient confirmed understanding and is in agreement with plan.    Findings and plan of  care discussed with supervising physician Dr. Tomi Bamberger who personally evaluated patient & is in agreement.   Final Clinical Impressions(s) / ED Diagnoses   Final diagnoses:  Dysphagia, unspecified type    ED Discharge Orders    None       Amaryllis Dyke, PA-C 12/20/18 1315    Dorie Rank, MD 12/21/18 [phone]

## 2018-12-30 ENCOUNTER — Ambulatory Visit (HOSPITAL_COMMUNITY)
Admission: EM | Admit: 2018-12-30 | Discharge: 2018-12-30 | Disposition: A | Discharge status: Home / Self Care | Payer: 59 | Attending: Family Medicine | Admitting: Family Medicine

## 2018-12-30 ENCOUNTER — Encounter (HOSPITAL_COMMUNITY): Payer: Self-pay | Admitting: Emergency Medicine

## 2018-12-30 DIAGNOSIS — T7840XA Allergy, unspecified, initial encounter: Secondary | ICD-10-CM

## 2018-12-30 DIAGNOSIS — R350 Frequency of micturition: Secondary | ICD-10-CM

## 2018-12-30 MED ORDER — BACITRACIN ZINC 500 UNIT/GM EX OINT
TOPICAL_OINTMENT | CUTANEOUS | Status: AC
  Filled 2018-12-30: qty 0.9

## 2018-12-30 MED ORDER — CETIRIZINE HCL 10 MG PO TABS: 10.0000 mg | ORAL_TABLET | Freq: Every day | ORAL | 1 refills | Status: DC

## 2018-12-30 NOTE — ED Triage Notes (Signed)
Pt c/o cough for 1 week, pt keeps clearing his throat. Pt also states hes been peeing a lot for "quite a while". Pt states it started last year. Poor historian.

## 2018-12-30 NOTE — Discharge Instructions (Addendum)
We will try adding Zyrtec daily to your regimen of medication to see if this helps with the congestion, mucus in your throat. You take this 1 time a day I would like for you to follow-up with your urologist as planned for the urinary frequency You are already on medication to help with this and they may want to switch your  medicines or do some further testing Follow up with the urologist, GI specialist and ENT as scheduled.

## 2018-12-31 NOTE — ED Provider Notes (Signed)
[place]    CSN: [id number] Arrival date & time: 12/30/18  1032     History   Chief Complaint Chief Complaint  Patient presents with  . Cough  . Urinary Frequency    HPI Randy Merritt is a 78 y.o. male.   Pt is a 78 year old male that presents with chronic complaints. He has a PMH of acid reflux, anxiety, arthritis, high cholesterol and allergies. He was just seen in the ER 10 days ago with similar complaints and had a complete work up. He has plans to see a urologist, ENT, and GI specialist in the next month.  His biggest concern is the constant mucous in his throat and drainage. He is currently on Singulair. 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 Pt is a very poor historian and jumping around to different times when explaining things. He denies any cough but complains about some sinus congestion and headache. No fever, chills, fatigue, myalgias or night sweats.  No abdominal pain, back pain, nausea, vomiting, diarrhea.  He does have some urinary frequency but this is chronic for him and he is already on medication to treat this.  No chest pain, shortness of breath or palpitations.  ROS per HPI      Past Medical History:  Diagnosis Date  . Acid reflux   . Anxiety   . Arthritis   . Depression    " a little"  . High cholesterol   . History of blood transfusion   . Seasonal allergies     Patient Active Problem List   Diagnosis Date Noted  . Ingrown toenail 10/11/2017  . Chondrodermatitis nodularis helicis of right ear 53/66/4403  . Oropharyngeal dysphagia 02/02/2017  . Chronic rhinitis 02/01/2017  . Gastroesophageal reflux disease 02/01/2017    Past Surgical History:  Procedure Laterality Date  . ANTERIOR CERVICAL DECOMP/DISCECTOMY FUSION N/A 04/08/2013   Procedure: ANTERIOR CERVICAL DECOMPRESSION/DISCECTOMY FUSION 1 LEVEL;  Surgeon: Floyce Stakes, MD;  Location: [facility];  Service: Neurosurgery;  Laterality: N/A;   Cervical five-six Anterior cervical decompression/diskectomy fusion  . CERVICAL DISCECTOMY     x2  . CHOLECYSTECTOMY    . gallstone removal         Home Medications    Prior to Admission medications   Medication Sig Start Date End Date Taking? Authorizing Provider  aspirin EC 81 MG tablet Take 81 mg by mouth daily.    [provider]  benzonatate (TESSALON) 200 MG capsule Take 200 mg by mouth as needed. 09/16/18   [provider]  cetirizine (ZYRTEC) 10 MG tablet Take 1 tablet (10 mg total) by mouth daily. 12/30/18   Loura Halt A, NP  chlordiazePOXIDE (LIBRIUM) 25 MG capsule Take 25 mg by mouth 3 (three) times daily as needed. anxiety 04/02/16   [provider]  finasteride (PROSCAR) 5 MG tablet Take 5 mg by mouth at bedtime.     [provider]  LORazepam (ATIVAN) 0.5 MG tablet Take 0.5 mg by mouth as needed for anxiety or sleep. Sleep or Anxiety 12/03/18   [provider]  meloxicam (MOBIC) 15 MG tablet Take 15 mg by mouth daily. 11/28/18   [provider]  montelukast (SINGULAIR) 10 MG tablet Take 10 mg by mouth daily. 12/06/18   [provider]  pantoprazole (PROTONIX) 40 MG tablet Take 40 mg by mouth at bedtime.     [provider]  rosuvastatin (CRESTOR) 10 MG tablet Take  10 mg by mouth daily. 11/19/18   [provider]    Family History No family history on file.  Social History Social History   Tobacco Use  . Smoking status: Former Research scientist (life sciences)  . Smokeless tobacco: Never Used  Substance Use Topics  . Alcohol use: No  . Drug use: No     Allergies   Patient has no known allergies.   Review of Systems Review of Systems   Physical Exam Triage Vital Signs ED Triage Vitals  Enc Vitals Group     BP 12/30/18 1118 (!) 142/95     Pulse Rate 12/30/18 1118 69     Resp 12/30/18 1118 18     Temp 12/30/18 1118 97.8 F (36.6 C)     Temp src --      SpO2 12/30/18 1118 100 %     Weight --       Height --      Head Circumference --      Peak Flow --      Pain Score 12/30/18 1119 0     Pain Loc --      Pain Edu? --      Excl. in [place]? --    No data found.  Updated Vital Signs BP (!) 142/95   Pulse 69   Temp 97.8 F (36.6 C)   Resp 18   SpO2 100%   Visual Acuity Right Eye Distance:   Left Eye Distance:   Bilateral Distance:    Right Eye Near:   Left Eye Near:    Bilateral Near:     Physical Exam Constitutional:      General: He is not in acute distress.    Appearance: Normal appearance. He is not ill-appearing, toxic-appearing or diaphoretic.  HENT:     Head: Normocephalic and atraumatic.     Right Ear: A middle ear effusion is present.     Left Ear: Tympanic membrane and ear canal normal.     Mouth/Throat:     Mouth: Mucous membranes are moist.     Pharynx: Oropharynx is clear. Uvula midline.     Tonsils: No tonsillar exudate or tonsillar abscesses. Swelling: 0 on the right. 0 on the left.     Comments: PND  Neck:     Musculoskeletal: Normal range of motion.      Comments: Pt with swelling to the right anterior neck, more that the left.  Non tender to palpation.  Cardiovascular:     Rate and Rhythm: Normal rate and regular rhythm.     Pulses: Normal pulses.     Heart sounds: Normal heart sounds.  Pulmonary:     Effort: Pulmonary effort is normal.     Breath sounds: Normal breath sounds.  Musculoskeletal: Normal range of motion.  Skin:    General: Skin is warm and dry.  Neurological:     Mental Status: He is alert.  Psychiatric:        Mood and Affect: Mood normal.      UC Treatments / Results  Labs (all labs ordered are listed, but only abnormal results are displayed) Labs Reviewed - No data to display  EKG None  Radiology No results found.  Procedures Procedures (including critical care time)  Medications Ordered in UC Medications - No data to display  Initial Impression / Assessment and Plan / UC Course  I have reviewed the  triage vital signs and the nursing notes.  Pertinent labs & imaging results that were  available during my care of the patient were reviewed by me and considered in my medical decision making (see chart for details).     Pt is a 78 year old male that presents today with chronic complaints and just had a full work-up done in the ER approximate 10 days ago. He has appointments to follow-up with urologist, gastroenterologist, ear nose and throat specialist. His biggest concern today is the mucus in his throat We will try Zyrtec daily to his medication regimen to see if this helps with the symptoms Otherwise he needs to follow-up with his primary care provider and specialist as planned His vital signs are stable and he is nontoxic or ill-appearing.  There is no concerning signs or symptoms on exam. Final Clinical Impressions(s) / UC Diagnoses   Final diagnoses:  Urinary frequency  Allergic state, initial encounter     Discharge Instructions     We will try adding Zyrtec daily to your regimen of medication to see if this helps with the congestion, mucus in your throat. You take this 1 time a day I would like for you to follow-up with your urologist as planned for the urinary frequency You are already on medication to help with this and they may want to switch your  medicines or do some further testing Follow up with the urologist, GI specialist and ENT as scheduled.      ED Prescriptions    Medication Sig Dispense Auth. Provider   cetirizine (ZYRTEC) 10 MG tablet Take 1 tablet (10 mg total) by mouth daily. 30 tablet Loura Halt A, NP     Controlled Substance Prescriptions [place] Controlled Substance Registry consulted? Not Applicable   Orvan July, NP 12/31/18 [phone]

## 2019-01-08 ENCOUNTER — Other Ambulatory Visit (HOSPITAL_COMMUNITY): Payer: Self-pay

## 2019-01-08 DIAGNOSIS — R1310 Dysphagia, unspecified: Secondary | ICD-10-CM

## 2019-01-09 DIAGNOSIS — M898X8 Other specified disorders of bone, other site: Secondary | ICD-10-CM | POA: Insufficient documentation

## 2019-01-16 ENCOUNTER — Encounter (HOSPITAL_COMMUNITY): Payer: Self-pay

## 2019-01-16 ENCOUNTER — Ambulatory Visit (HOSPITAL_COMMUNITY)
Admission: EM | Admit: 2019-01-16 | Discharge: 2019-01-16 | Disposition: A | Discharge status: Home / Self Care | Payer: 59 | Attending: Family Medicine | Admitting: Family Medicine

## 2019-01-16 ENCOUNTER — Ambulatory Visit (INDEPENDENT_AMBULATORY_CARE_PROVIDER_SITE_OTHER): Payer: 59

## 2019-01-16 DIAGNOSIS — K219 Gastro-esophageal reflux disease without esophagitis: Secondary | ICD-10-CM

## 2019-01-16 DIAGNOSIS — K5900 Constipation, unspecified: Secondary | ICD-10-CM

## 2019-01-16 DIAGNOSIS — R1084 Generalized abdominal pain: Secondary | ICD-10-CM | POA: Diagnosis not present

## 2019-01-16 MED ORDER — ALUM & MAG HYDROXIDE-SIMETH 200-200-20 MG/5ML PO SUSP
ORAL | Status: AC
  Filled 2019-01-16: qty 30

## 2019-01-16 MED ORDER — LIDOCAINE VISCOUS HCL 2 % MT SOLN
15.0000 mL | Freq: Once | OROMUCOSAL | Status: AC
  Administered 2019-01-16: 15 mL via ORAL

## 2019-01-16 MED ORDER — ALUM & MAG HYDROXIDE-SIMETH 200-200-20 MG/5ML PO SUSP
30.0000 mL | Freq: Once | ORAL | Status: AC
  Administered 2019-01-16: 30 mL via ORAL

## 2019-01-16 MED ORDER — LIDOCAINE VISCOUS HCL 2 % MT SOLN
OROMUCOSAL | Status: AC
  Filled 2019-01-16: qty 15

## 2019-01-16 NOTE — Discharge Instructions (Addendum)
I believe that your symptoms are related to constipation and acid reflux Your abdominal x ray revealed a lot of stool Your EKG was normal.  We gave you a cocktail in the clinic for acid reflux and I recommend taking some miralax for constipation. You can take the miralax twice a day until you get a good bowel movement and then decrease to once a day.  Make sure that you are drinking fluids.  Follow up as needed for continued or worsening symptoms

## 2019-01-16 NOTE — ED Provider Notes (Addendum)
[name]    CSN: [id number] Arrival date & time: 01/16/19  6144     History   Chief Complaint Chief Complaint  Patient presents with  . Abdominal Pain  . Chest Pain    HPI Randy Merritt is a 78 y.o. male.   Patient is a 78 year old male with past medical history of acid reflux, anxiety, arthritis, depression, high cholesterol, allergies, dysphagia.  He presents with approximately a day and a half of epigastric discomfort, chest pain and burning. Symptoms have been waxing and waning. He has been taking Pepto without relief. He takes Protonix daily. Eating makes the pain worse. He has also been suffering from some constipation over the last week. This is a problem for him from time to time. He takes some medication from his brother for this. He is unsure of what it is.  Denies any associated palpitations, shortness of breath, nausea, vomiting, diarrhea.  No fevers, chills, myalgias.      Past Medical History:  Diagnosis Date  . Acid reflux   . Anxiety   . Arthritis   . Depression    " a little"  . High cholesterol   . History of blood transfusion   . Seasonal allergies     Patient Active Problem List   Diagnosis Date Noted  . Ingrown toenail 10/11/2017  . Chondrodermatitis nodularis helicis of right ear 31/54/0086  . Oropharyngeal dysphagia 02/02/2017  . Chronic rhinitis 02/01/2017  . Gastroesophageal reflux disease 02/01/2017    Past Surgical History:  Procedure Laterality Date  . ANTERIOR CERVICAL DECOMP/DISCECTOMY FUSION N/A 04/08/2013   Procedure: ANTERIOR CERVICAL DECOMPRESSION/DISCECTOMY FUSION 1 LEVEL;  Surgeon: Floyce Stakes, MD;  Location: [facility];  Service: Neurosurgery;  Laterality: N/A;  Cervical five-six Anterior cervical decompression/diskectomy fusion  . CERVICAL DISCECTOMY     x2  . CHOLECYSTECTOMY    . gallstone removal         Home Medications    Prior to Admission medications   Medication Sig Start Date End Date  Taking? Authorizing Provider  aspirin EC 81 MG tablet Take 81 mg by mouth daily.    [provider]  benzonatate (TESSALON) 200 MG capsule Take 200 mg by mouth as needed. 09/16/18   [provider]  cetirizine (ZYRTEC) 10 MG tablet Take 1 tablet (10 mg total) by mouth daily. 12/30/18   Loura Halt A, NP  chlordiazePOXIDE (LIBRIUM) 25 MG capsule Take 25 mg by mouth 3 (three) times daily as needed. anxiety 04/02/16   [provider]  finasteride (PROSCAR) 5 MG tablet Take 5 mg by mouth at bedtime.     [provider]  LORazepam (ATIVAN) 0.5 MG tablet Take 0.5 mg by mouth as needed for anxiety or sleep. Sleep or Anxiety 12/03/18   [provider]  meloxicam (MOBIC) 15 MG tablet Take 15 mg by mouth daily. 11/28/18   [provider]  montelukast (SINGULAIR) 10 MG tablet Take 10 mg by mouth daily. 12/06/18   [provider]  pantoprazole (PROTONIX) 40 MG tablet Take 40 mg by mouth at bedtime.     [provider]  rosuvastatin (CRESTOR) 10 MG tablet Take 10 mg by mouth daily. 11/19/18   [provider]    Family History History reviewed. No pertinent family history.  Social History Social History   Tobacco Use  . Smoking status: Former Research scientist (life sciences)  . Smokeless tobacco: Never Used  Substance Use Topics  . Alcohol use: No  . Drug  use: No     Allergies   Patient has no known allergies.   Review of Systems Review of Systems  Constitutional: Positive for appetite change. Negative for chills, fatigue, fever and unexpected weight change.  Respiratory: Negative for chest tightness and shortness of breath.   Cardiovascular: Negative for chest pain, palpitations and leg swelling.  Gastrointestinal: Positive for abdominal distention, abdominal pain and constipation. Negative for diarrhea, nausea, rectal pain and vomiting.  Genitourinary: Negative for decreased urine volume, difficulty urinating, dysuria, hematuria and urgency.    Skin: Negative for color change and pallor.     Physical Exam Triage Vital Signs ED Triage Vitals  Enc Vitals Group     BP 01/16/19 0859 (!) 147/75     Pulse Rate 01/16/19 0859 70     Resp 01/16/19 0859 18     Temp 01/16/19 0859 (!) 97.5 F (36.4 C)     Temp Source 01/16/19 0859 Oral     SpO2 01/16/19 0859 99 %     Weight --      Height --      Head Circumference --      Peak Flow --      Pain Score 01/16/19 0902 6     Pain Loc --      Pain Edu? --      Excl. in [place]? --    No data found.  Updated Vital Signs BP (!) 147/75 (BP Location: Right Arm)   Pulse 70   Temp (!) 97.5 F (36.4 C) (Oral)   Resp 18   SpO2 99%   Visual Acuity Right Eye Distance:   Left Eye Distance:   Bilateral Distance:    Right Eye Near:   Left Eye Near:    Bilateral Near:     Physical Exam Vitals signs and nursing note reviewed.  Constitutional:      General: He is not in acute distress.    Appearance: He is well-developed and normal weight. He is not ill-appearing, toxic-appearing or diaphoretic.  HENT:     Head: Normocephalic and atraumatic.  Cardiovascular:     Rate and Rhythm: Normal rate and regular rhythm.  Pulmonary:     Effort: Pulmonary effort is normal.     Breath sounds: Normal breath sounds.  Abdominal:     General: Bowel sounds are increased. There is distension.     Tenderness: There is abdominal tenderness in the right upper quadrant, epigastric area and left upper quadrant. There is no right CVA tenderness, left CVA tenderness, guarding or rebound.     Hernia: No hernia is present.  Skin:    General: Skin is warm and dry.     Findings: No rash.  Neurological:     Mental Status: He is alert.  Psychiatric:        Mood and Affect: Mood normal.      UC Treatments / Results  Labs (all labs ordered are listed, but only abnormal results are displayed) Labs Reviewed - No data to display  EKG None  Radiology Dg Abd 1 View  Result Date: 01/16/2019 CLINICAL  DATA:  Patient states that he has had lower abdominal pain x months and acid reflux last week. R/o constipation. EXAM: ABDOMEN - 1 VIEW COMPARISON:  12/20/2018 FINDINGS: There is a significant stool burden throughout nondilated loops of large bowel. No small bowel dilatation. No evidence for organomegaly. No abnormal calcifications. Surgical clips in the RIGHT UPPER QUADRANT the abdomen. Degenerative changes are seen in the  spine. IMPRESSION: Significant stool burden. Electronically Signed   By: Nolon Nations M.D.   On: 01/16/2019 09:45    Procedures Procedures (including critical care time)  Medications Ordered in UC Medications  alum & mag hydroxide-simeth (MAALOX/MYLANTA) 200-200-20 MG/5ML suspension 30 mL (30 mLs Oral Given 01/16/19 1015)    And  lidocaine (XYLOCAINE) 2 % viscous mouth solution 15 mL (15 mLs Oral Given 01/16/19 1015)    Initial Impression / Assessment and Plan / UC Course  I have reviewed the triage vital signs and the nursing notes.  Pertinent labs & imaging results that were available during my care of the patient were reviewed by me and considered in my medical decision making (see chart for details).     Pt is a 78 year old male with waxing and waning epigastric/chest discomfort over the last 2 days.  Hx of constipation and acid reflux Symptoms consistent with both.  X ray revealed moderate stool burden  EKG revealed sinus bradycardia with sinus arrhythmia but otherwise normal No concern for ACS VSS non toxic or ill appearing.  GI cocktail given in clinic with some relief We will send him home to use MiraLAX for constipation Follow up as needed for continued or worsening symptoms    Final Clinical Impressions(s) / UC Diagnoses   Final diagnoses:  Gastroesophageal reflux disease without esophagitis  Constipation, unspecified constipation type     Discharge Instructions     I believe that your symptoms are related to constipation and acid reflux Your  abdominal x ray revealed a lot of stool Your EKG was normal.  We gave you a cocktail in the clinic for acid reflux and I recommend taking some miralax for constipation. You can take the miralax twice a day until you get a good bowel movement and then decrease to once a day.  Make sure that you are drinking fluids.  Follow up as needed for continued or worsening symptoms      ED Prescriptions    None     Controlled Substance Prescriptions [place] Controlled Substance Registry consulted? Not Applicable        Orvan July, NP 01/16/19 [phone]

## 2019-01-16 NOTE — ED Triage Notes (Signed)
Pt present chest pain that started this am, pt states the pain starts from his chest and goes toward his stomach.

## 2019-01-22 ENCOUNTER — Ambulatory Visit (HOSPITAL_COMMUNITY)
Admission: RE | Admit: 2019-01-22 | Discharge: 2019-01-22 | Disposition: A | Discharge status: Home / Self Care | Payer: 59 | Source: Ambulatory Visit | Attending: Gastroenterology | Admitting: Gastroenterology

## 2019-01-22 ENCOUNTER — Other Ambulatory Visit: Payer: Self-pay

## 2019-01-22 DIAGNOSIS — R1310 Dysphagia, unspecified: Secondary | ICD-10-CM | POA: Diagnosis not present

## 2019-01-28 ENCOUNTER — Other Ambulatory Visit: Payer: Self-pay | Admitting: Gastroenterology

## 2019-01-29 NOTE — Anesthesia Preprocedure Evaluation (Addendum)
Anesthesia Evaluation  Patient identified by MRN, date of birth, ID band  Reviewed: Allergy & Precautions, NPO status , Patient's Chart, lab work & pertinent test results  History of Anesthesia Complications Negative for: history of anesthetic complications  Airway Mallampati: II  TM Distance: >3 FB Neck ROM: Full    Dental  (+) Dental Advisory Given, Missing,    Pulmonary former smoker,    Pulmonary exam normal breath sounds clear to auscultation       Cardiovascular negative cardio ROS Normal cardiovascular exam Rhythm:Regular Rate:Normal     Neuro/Psych Anxiety Depression negative neurological ROS     GI/Hepatic Neg liver ROS, GERD  ,  Endo/Other  negative endocrine ROS  Renal/GU negative Renal ROS     Musculoskeletal  (+) Arthritis ,   Abdominal   Peds  Hematology negative hematology ROS (+)   Anesthesia Other Findings Day of surgery medications reviewed with the patient.  Reproductive/Obstetrics                            Anesthesia Physical Anesthesia Plan  ASA: II  Anesthesia Plan: MAC   Post-op Pain Management:    Induction:   PONV Risk Score and Plan: Treatment may vary due to age or medical condition and Propofol infusion  Airway Management Planned: Natural Airway and Nasal Cannula  Additional Equipment: None  Intra-op Plan:   Post-operative Plan:   Informed Consent: I have reviewed the patients History and Physical, chart, labs and discussed the procedure including the risks, benefits and alternatives for the proposed anesthesia with the patient or authorized representative who has indicated his/her understanding and acceptance.     Dental advisory given  Plan Discussed with: CRNA  Anesthesia Plan Comments:        Anesthesia Quick Evaluation

## 2019-01-30 ENCOUNTER — Ambulatory Visit (HOSPITAL_COMMUNITY): Payer: 59 | Admitting: Anesthesiology

## 2019-01-30 ENCOUNTER — Encounter (HOSPITAL_COMMUNITY)
Admission: RE | Disposition: A | Discharge status: Home / Self Care | Payer: Self-pay | Source: Home / Self Care | Attending: Gastroenterology

## 2019-01-30 ENCOUNTER — Other Ambulatory Visit: Payer: Self-pay

## 2019-01-30 ENCOUNTER — Ambulatory Visit (HOSPITAL_COMMUNITY)
Admission: RE | Admit: 2019-01-30 | Discharge: 2019-01-30 | Disposition: A | Discharge status: Home / Self Care | Payer: 59 | Attending: Gastroenterology | Admitting: Gastroenterology

## 2019-01-30 ENCOUNTER — Encounter (HOSPITAL_COMMUNITY): Payer: Self-pay | Admitting: Certified Registered Nurse Anesthetist

## 2019-01-30 DIAGNOSIS — Z79899 Other long term (current) drug therapy: Secondary | ICD-10-CM | POA: Insufficient documentation

## 2019-01-30 DIAGNOSIS — Z87891 Personal history of nicotine dependence: Secondary | ICD-10-CM | POA: Diagnosis not present

## 2019-01-30 DIAGNOSIS — K2950 Unspecified chronic gastritis without bleeding: Secondary | ICD-10-CM | POA: Diagnosis not present

## 2019-01-30 DIAGNOSIS — F329 Major depressive disorder, single episode, unspecified: Secondary | ICD-10-CM | POA: Insufficient documentation

## 2019-01-30 DIAGNOSIS — F419 Anxiety disorder, unspecified: Secondary | ICD-10-CM | POA: Diagnosis not present

## 2019-01-30 DIAGNOSIS — R1310 Dysphagia, unspecified: Secondary | ICD-10-CM | POA: Diagnosis not present

## 2019-01-30 DIAGNOSIS — M1990 Unspecified osteoarthritis, unspecified site: Secondary | ICD-10-CM | POA: Diagnosis not present

## 2019-01-30 DIAGNOSIS — E7800 Pure hypercholesterolemia, unspecified: Secondary | ICD-10-CM | POA: Insufficient documentation

## 2019-01-30 HISTORY — PX: BIOPSY: SHX5522

## 2019-01-30 HISTORY — PX: ESOPHAGOGASTRODUODENOSCOPY (EGD) WITH PROPOFOL: SHX5813

## 2019-01-30 HISTORY — PX: ESOPHAGEAL DILATION: SHX303

## 2019-01-30 SURGERY — ESOPHAGOGASTRODUODENOSCOPY (EGD) WITH PROPOFOL
Anesthesia: Monitor Anesthesia Care

## 2019-01-30 MED ORDER — PROPOFOL 500 MG/50ML IV EMUL
INTRAVENOUS | Status: DC | PRN
  Administered 2019-01-30: 30 mg via INTRAVENOUS

## 2019-01-30 MED ORDER — PROPOFOL 500 MG/50ML IV EMUL
INTRAVENOUS | Status: DC | PRN
  Administered 2019-01-30: 150 ug/kg/min via INTRAVENOUS

## 2019-01-30 MED ORDER — LACTATED RINGERS IV SOLN
INTRAVENOUS | Status: DC
  Administered 2019-01-30: 09:00:00 via INTRAVENOUS

## 2019-01-30 MED ORDER — PROPOFOL 10 MG/ML IV BOLUS
INTRAVENOUS | Status: AC
  Filled 2019-01-30: qty 40

## 2019-01-30 MED ORDER — SODIUM CHLORIDE 0.9 % IV SOLN: INTRAVENOUS | Status: DC

## 2019-01-30 SURGICAL SUPPLY — 15 items

## 2019-01-30 NOTE — Op Note (Addendum)
[facility] Patient Name: Randy Merritt Procedure Date: 01/30/2019 MRN: [id number] Attending MD: Carol Ada , MD Date of Birth: [birth year] CSN: [id number] Age: 78 Admit Type: Outpatient Procedure:                Upper GI endoscopy Indications:              Dysphagia Providers:                Carol Ada, MD, Cleda Daub, RN, Charolette Child,                            Technician, Herbie Drape, CRNA Referring MD:              Medicines:                Propofol per Anesthesia Complications:            No immediate complications. Estimated Blood Loss:     Estimated blood loss was minimal. Procedure:                Pre-Anesthesia Assessment:                           - Prior to the procedure, a History and Physical                            was performed, and patient medications and                            allergies were reviewed. The patient's tolerance of                            previous anesthesia was also reviewed. The risks                            and benefits of the procedure and the sedation                            options and risks were discussed with the patient.                            All questions were answered, and informed consent                            was obtained. Prior Anticoagulants: The patient has                            taken no previous anticoagulant or antiplatelet                            agents. ASA Grade Assessment: III - A patient with                            severe systemic disease. After reviewing the risks  and benefits, the patient was deemed in                            satisfactory condition to undergo the procedure.                           - Sedation was administered by an anesthesia                            professional. Deep sedation was attained.                           After obtaining informed consent, the endoscope was                            passed under direct  vision. Throughout the                            procedure, the patient's blood pressure, pulse, and                            oxygen saturations were monitored continuously. The                            GIF-H190 ([id number]) Olympus gastroscope was                            introduced through the mouth, and advanced to the                            second part of duodenum. The upper GI endoscopy was                            technically difficult and complex. The patient                            tolerated the procedure well. Scope In: Scope Out: Findings:      No endoscopic abnormality was evident in the esophagus to explain the       patient's complaint of dysphagia. It was decided, however, to proceed       with dilation of the entire esophagus. A guidewire was placed and the       scope was withdrawn. Dilation was performed with a Savary dilator with       no resistance at 12.8 mm. The dilation site was examined following       endoscope reinsertion and showed no change. Estimated blood loss: none.      Normal mucosa was found in the entire esophagus. Biopsies were taken       with a cold forceps for histology.      Localized moderate inflammation characterized by erythema and       granularity was found in the gastric antrum. Biopsies were taken with a       cold forceps for histology.      The examined duodenum was normal.      The intent was to dilate the  esopahgus with an 18 mm Savary. There was       some difficulty with passage of the endoscope through the posterior       pharynx and into the esophagus. This distortion was not able to be       adequately characterized. Careful inspection of the UES did not reveal       and clear evidence of a stenosis. Over a guidewire the 18 mm was       inserted into the oral cavity, but significant resistance was       encountered. Adjustment of his neck did not alleviate the situation.       Only gentle pressure was applied and the  dilator was not able to be       safely advanced. Downsizing the dilator to the 16 mm resulted in the       same issue. Using a 12.8 mm Savary did allow for easier passage, but       there was still resistance. Reinspection of the UES and esophagus, as       well as the posterior pharynx did not reveal any mucosal breaks. Impression:               - No endoscopic esophageal abnormality to explain                            patient's dysphagia. Esophagus dilated. Dilated.                           - Normal mucosa was found in the entire esophagus.                            Biopsied.                           - Gastritis. Biopsied.                           - Normal examined duodenum. Moderate Sedation:      Not Applicable - Patient had care per Anesthesia. Recommendation:           - Patient has a contact number available for                            emergencies. The signs and symptoms of potential                            delayed complications were discussed with the                            patient. Return to normal activities tomorrow.                            Written discharge instructions were provided to the                            patient.                           - Resume previous  diet.                           - Continue present medications.                           - Await pathology results.                           - Return to GI clinic in 4 weeks. Procedure Code(s):        --- Professional ---                           [id number], Esophagogastroduodenoscopy, flexible,                            transoral; with insertion of guide wire followed by                            passage of dilator(s) through esophagus over guide                            wire                           43239, 59, Esophagogastroduodenoscopy, flexible,                            transoral; with biopsy, single or multiple Diagnosis Code(s):        --- Professional ---                            R13.10, Dysphagia, unspecified                           K29.70, Gastritis, unspecified, without bleeding CPT copyright 2018 [employer]. All rights reserved. The codes documented in this report are preliminary and upon coder review may  be revised to meet current compliance requirements. Carol Ada, MD Carol Ada, MD 01/30/2019 10:23:27 AM This report has been signed electronically. Number of Addenda: 0

## 2019-01-30 NOTE — H&P (Signed)
  Randy Merritt HPI: This is a 78 year old male with complaints of dysphagia.  There is a component of oropharyngeal dysphagia as well as mechanical obstruction, per the MBS results.  Past Medical History:  Diagnosis Date  . Acid reflux   . Anxiety   . Arthritis   . Depression    " a little"  . High cholesterol   . History of blood transfusion   . Seasonal allergies     Past Surgical History:  Procedure Laterality Date  . ANTERIOR CERVICAL DECOMP/DISCECTOMY FUSION N/A 04/08/2013   Procedure: ANTERIOR CERVICAL DECOMPRESSION/DISCECTOMY FUSION 1 LEVEL;  Surgeon: Floyce Stakes, MD;  Location: [facility];  Service: Neurosurgery;  Laterality: N/A;  Cervical five-six Anterior cervical decompression/diskectomy fusion  . CERVICAL DISCECTOMY     x2  . CHOLECYSTECTOMY    . gallstone removal      History reviewed. No pertinent family history.  Social History:  reports that he has quit smoking. He has never used smokeless tobacco. He reports that he does not drink alcohol or use drugs.  Allergies: No Known Allergies  Medications:  Scheduled:  Continuous: . lactated ringers 10 mL/hr at 01/30/19 3361    No results found for this or any previous visit (from the past 24 hour(s)).   No results found.  ROS:  As stated above in the HPI otherwise negative.  Blood pressure (!) 168/97, pulse 62, temperature 97.6 F (36.4 C), temperature source Oral, resp. rate 16, height 5\' 9"  (1.753 m), weight 68 kg, SpO2 99 %.    PE: Gen: NAD, Alert and Oriented HEENT:  [place]/AT, EOMI Neck: Supple, no LAD Lungs: CTA Bilaterally CV: RRR without M/G/R ABM: Soft, NTND, +BS Ext: No C/C/E  Assessment/Plan: 1) Dysphagia - EGD with dilation.  Chennel Olivos D 01/30/2019, 9:39 AM

## 2019-01-30 NOTE — Anesthesia Postprocedure Evaluation (Signed)
Anesthesia Post Note  Patient: Lamontae Ricardo  Procedure(s) Performed: ESOPHAGOGASTRODUODENOSCOPY (EGD) WITH PROPOFOL (N/A ) BIOPSY BILIARY DILATION     Patient location during evaluation: PACU Anesthesia Type: MAC Level of consciousness: awake and alert Pain management: pain level controlled Vital Signs Assessment: post-procedure vital signs reviewed and stable Respiratory status: spontaneous breathing, nonlabored ventilation and respiratory function stable Cardiovascular status: blood pressure returned to baseline and stable Postop Assessment: no apparent nausea or vomiting Anesthetic complications: no    Last Vitals:  Vitals:   01/30/19 1050 01/30/19 1100  BP: 116/74 (!) 157/65  Pulse: (!) 51 (!) 52  Resp: 13 19  Temp:    SpO2: 100% 98%    Last Pain:  Vitals:   01/30/19 1100  TempSrc:   PainSc: 0-No pain                 Brennan Bailey

## 2019-01-30 NOTE — Transfer of Care (Signed)
Immediate Anesthesia Transfer of Care Note  Patient: Randy Merritt  Procedure(s) Performed: ESOPHAGOGASTRODUODENOSCOPY (EGD) WITH PROPOFOL (N/A ) BIOPSY BILIARY DILATION  Patient Location: PACU  Anesthesia Type:MAC  Level of Consciousness: awake, alert  and oriented  Airway & Oxygen Therapy: Patient Spontanous Breathing and Patient connected to face mask oxygen  Post-op Assessment: Report given to RN and Post -op Vital signs reviewed and stable  Post vital signs: Reviewed and stable  Last Vitals:  Vitals Value Taken Time  BP 123/45 01/30/2019 10:20 AM  Temp 36.4 C 01/30/2019 10:18 AM  Pulse 64 01/30/2019 10:21 AM  Resp 19 01/30/2019 10:21 AM  SpO2 100 % 01/30/2019 10:21 AM  Vitals shown include unvalidated device data.  Last Pain:  Vitals:   01/30/19 1018  TempSrc: Oral  PainSc: Asleep         Complications: No apparent anesthesia complications

## 2019-01-30 NOTE — Discharge Instructions (Signed)

## 2019-06-19 ENCOUNTER — Other Ambulatory Visit: Payer: Self-pay

## 2019-06-19 ENCOUNTER — Ambulatory Visit (HOSPITAL_COMMUNITY)
Admission: EM | Admit: 2019-06-19 | Discharge: 2019-06-19 | Disposition: A | Discharge status: Home / Self Care | Payer: 59 | Attending: Family Medicine | Admitting: Family Medicine

## 2019-06-19 ENCOUNTER — Encounter (HOSPITAL_COMMUNITY): Payer: Self-pay

## 2019-06-19 DIAGNOSIS — Z87891 Personal history of nicotine dependence: Secondary | ICD-10-CM | POA: Diagnosis not present

## 2019-06-19 DIAGNOSIS — E7800 Pure hypercholesterolemia, unspecified: Secondary | ICD-10-CM | POA: Diagnosis not present

## 2019-06-19 DIAGNOSIS — K219 Gastro-esophageal reflux disease without esophagitis: Secondary | ICD-10-CM | POA: Diagnosis not present

## 2019-06-19 DIAGNOSIS — M1990 Unspecified osteoarthritis, unspecified site: Secondary | ICD-10-CM | POA: Diagnosis not present

## 2019-06-19 DIAGNOSIS — K5900 Constipation, unspecified: Secondary | ICD-10-CM | POA: Insufficient documentation

## 2019-06-19 DIAGNOSIS — Z20828 Contact with and (suspected) exposure to other viral communicable diseases: Secondary | ICD-10-CM | POA: Insufficient documentation

## 2019-06-19 DIAGNOSIS — R05 Cough: Secondary | ICD-10-CM

## 2019-06-19 DIAGNOSIS — Z79899 Other long term (current) drug therapy: Secondary | ICD-10-CM | POA: Insufficient documentation

## 2019-06-19 DIAGNOSIS — Z7982 Long term (current) use of aspirin: Secondary | ICD-10-CM | POA: Diagnosis not present

## 2019-06-19 DIAGNOSIS — R059 Cough, unspecified: Secondary | ICD-10-CM

## 2019-06-19 DIAGNOSIS — F419 Anxiety disorder, unspecified: Secondary | ICD-10-CM | POA: Diagnosis not present

## 2019-06-19 MED ORDER — BENZONATATE 200 MG PO CAPS: 200.0000 mg | ORAL_CAPSULE | Freq: Three times a day (TID) | ORAL | 0 refills | Status: AC | PRN

## 2019-06-19 MED ORDER — DM-GUAIFENESIN ER 30-600 MG PO TB12: 1.0000 | ORAL_TABLET | Freq: Two times a day (BID) | ORAL | 0 refills | Status: DC

## 2019-06-19 MED ORDER — ALUM & MAG HYDROXIDE-SIMETH 200-200-20 MG/5ML PO SUSP: 15.0000 mL | Freq: Four times a day (QID) | ORAL | 0 refills | Status: DC | PRN

## 2019-06-19 NOTE — Discharge Instructions (Signed)
We swabbed you for COVID.  This should return in 3 to 5 days.  For cough please use Tessalon every 8 hours as needed For congestion and mucus please try Mucinex DM twice daily  For reflux/burning sensation may use Maalox every 6-8 hours as needed Continue with MiraLAX, Colace/docusate to help regulate bowels  Please follow-up if cough persisting, developing shortness of breath, fevers, persistent symptoms, worsening reflux, chest discomfort or abdominal pain.

## 2019-06-19 NOTE — ED Triage Notes (Signed)
Patient presents to Urgent Care with complaints of chest congestion and cough since a few days ago, worse yesterday. Patient reports he coughs up dark thick mucous, has some chest burning with coughing.

## 2019-06-19 NOTE — ED Provider Notes (Signed)
[place]    CSN: [id number] Arrival date & time: 06/19/19  2706      History   Chief Complaint Chief Complaint  Patient presents with  . Cough    HPI Randy Merritt is a 78 y.o. male history of GERD, anxiety, presenting today for evaluation of cough, congestion and reflux symptoms.  Patient states that over the past couple days he has had a cough.  At times he has had difficulty producing the phlegm out of his chest.  States occasionally will be yellow, occasionally dark.  Feels thick.  At times he only brings up spit.  He notes that last night he had a sensation of burning sensation in his central chest which felt like his typical reflux symptoms.  Symptoms resolved after approximately 1.5 minutes.  He has also had issues with constipation.  Denies abdominal pain.  Denies nausea or vomiting.  Denies fevers chills or body aches.  Denies known exposure to COVID.  Overall normal energy level, but the cough/phlegm is his main concern.  HPI  Past Medical History:  Diagnosis Date  . Acid reflux   . Anxiety   . Arthritis   . Depression    " a little"  . High cholesterol   . History of blood transfusion   . Seasonal allergies     Patient Active Problem List   Diagnosis Date Noted  . Ingrown toenail 10/11/2017  . Chondrodermatitis nodularis helicis of right ear 23/76/2831  . Oropharyngeal dysphagia 02/02/2017  . Chronic rhinitis 02/01/2017  . Gastroesophageal reflux disease 02/01/2017    Past Surgical History:  Procedure Laterality Date  . ANTERIOR CERVICAL DECOMP/DISCECTOMY FUSION N/A 04/08/2013   Procedure: ANTERIOR CERVICAL DECOMPRESSION/DISCECTOMY FUSION 1 LEVEL;  Surgeon: Floyce Stakes, MD;  Location: [facility];  Service: Neurosurgery;  Laterality: N/A;  Cervical five-six Anterior cervical decompression/diskectomy fusion  . BIOPSY  01/30/2019   Procedure: BIOPSY;  Surgeon: Carol Ada, MD;  Location: [facility];  Service: Endoscopy;;  . CERVICAL  DISCECTOMY     x2  . CHOLECYSTECTOMY    . ESOPHAGEAL DILATION  01/30/2019   Procedure: ESOPHAGEAL DILATION;  Surgeon: Carol Ada, MD;  Location: [facility];  Service: Endoscopy;;  [place]  . ESOPHAGOGASTRODUODENOSCOPY (EGD) WITH PROPOFOL N/A 01/30/2019   Procedure: ESOPHAGOGASTRODUODENOSCOPY (EGD) WITH PROPOFOL;  Surgeon: Carol Ada, MD;  Location: [facility];  Service: Endoscopy;  Laterality: N/A;  . gallstone removal         Home Medications    Prior to Admission medications   Medication Sig Start Date End Date Taking? Authorizing Provider  alum & mag hydroxide-simeth (MAALOX/MYLANTA) 200-200-20 MG/5ML suspension Take 15 mLs by mouth every 6 (six) hours as needed for indigestion or heartburn. 06/19/19   Wieters, Hallie C, PA-C  aspirin EC 81 MG tablet Take 81 mg by mouth daily.    [provider]  benzonatate (TESSALON) 200 MG capsule Take 1 capsule (200 mg total) by mouth 3 (three) times daily as needed for up to 7 days for cough. 06/19/19 06/26/19  Wieters, Hallie C, PA-C  chlordiazePOXIDE (LIBRIUM) 25 MG capsule Take 25 mg by mouth 3 (three) times daily as needed for anxiety.  04/02/16   [provider]  dextromethorphan-guaiFENesin (MUCINEX DM) 30-600 MG 12hr tablet Take 1 tablet by mouth 2 (two) times daily. 06/19/19   Wieters, Hallie C, PA-C  finasteride (PROSCAR) 5 MG tablet Take 5 mg by mouth at bedtime.     [provider]  ipratropium (ATROVENT) 0.06 %  nasal spray Place 2 sprays into both nostrils 4 (four) times daily as needed for rhinitis (allergies).    [provider]  montelukast (SINGULAIR) 10 MG tablet Take 10 mg by mouth at bedtime.  12/06/18   [provider]  rosuvastatin (CRESTOR) 10 MG tablet Take 10 mg by mouth every evening.  11/19/18   [provider]  traZODone (DESYREL) 50 MG tablet Take 50 mg by mouth at bedtime. 01/20/19   [provider]    Family History Family History  Problem Relation Age of  Onset  . Healthy Mother   . Healthy Father     Social History Social History   Tobacco Use  . Smoking status: Former Research scientist (life sciences)  . Smokeless tobacco: Never Used  Substance Use Topics  . Alcohol use: No  . Drug use: No     Allergies   Patient has no known allergies.   Review of Systems Review of Systems  Constitutional: Negative for activity change, appetite change, chills, fatigue and fever.  HENT: Positive for rhinorrhea. Negative for congestion, ear pain, sinus pressure, sore throat and trouble swallowing.   Eyes: Negative for discharge and redness.  Respiratory: Positive for cough. Negative for chest tightness and shortness of breath.   Cardiovascular: Negative for chest pain.  Gastrointestinal: Positive for constipation. Negative for abdominal pain, diarrhea, nausea and vomiting.  Musculoskeletal: Negative for myalgias.  Skin: Negative for rash.  Neurological: Negative for dizziness, light-headedness and headaches.     Physical Exam Triage Vital Signs ED Triage Vitals  Enc Vitals Group     BP 06/19/19 1108 (!) 148/80     Pulse Rate 06/19/19 1108 63     Resp 06/19/19 1108 17     Temp 06/19/19 1108 97.6 F (36.4 C)     Temp Source 06/19/19 1108 Oral     SpO2 06/19/19 1108 100 %     Weight --      Height --      Head Circumference --      Peak Flow --      Pain Score 06/19/19 1106 0     Pain Loc --      Pain Edu? --      Excl. in [place]? --    No data found.  Updated Vital Signs BP (!) 148/80 (BP Location: Left Arm)   Pulse 63   Temp 97.6 F (36.4 C) (Oral)   Resp 17   SpO2 100%   Visual Acuity Right Eye Distance:   Left Eye Distance:   Bilateral Distance:    Right Eye Near:   Left Eye Near:    Bilateral Near:     Physical Exam Vitals signs and nursing note reviewed.  Constitutional:      Appearance: He is well-developed.  HENT:     Head: Normocephalic and atraumatic.     Ears:     Comments: Bilateral ears without tenderness to palpation of  external auricle, tragus and mastoid, EAC's without erythema or swelling, TM's with good bony landmarks and cone of light. Non erythematous.    Mouth/Throat:     Comments: Oral mucosa pink and moist, no tonsillar enlargement or exudate. Posterior pharynx patent and nonerythematous, no uvula deviation or swelling. Normal phonation. Eyes:     Conjunctiva/sclera: Conjunctivae normal.  Neck:     Musculoskeletal: Neck supple.  Cardiovascular:     Rate and Rhythm: Normal rate and regular rhythm.     Heart sounds: No murmur.  Pulmonary:  Effort: Pulmonary effort is normal. No respiratory distress.     Breath sounds: Normal breath sounds.     Comments: Breathing comfortably at rest, CTABL, no wheezing, rales or other adventitious sounds auscultated No coughing during visit Abdominal:     Tenderness: There is no abdominal tenderness.     Comments: Abdomen slightly tense and distended, but nontender  Skin:    General: Skin is warm and dry.  Neurological:     Mental Status: He is alert.      UC Treatments / Results  Labs (all labs ordered are listed, but only abnormal results are displayed) Labs Reviewed  NOVEL CORONAVIRUS, NAA (HOSPITAL ORDER, SEND-OUT TO REF LAB)    EKG   Radiology No results found.  Procedures Procedures (including critical care time)  Medications Ordered in UC Medications - No data to display  Initial Impression / Assessment and Plan / UC Course  I have reviewed the triage vital signs and the nursing notes.  Pertinent labs & imaging results that were available during my care of the patient were reviewed by me and considered in my medical decision making (see chart for details).     Cough x2 to 3 days, vital signs stable without fever, tachycardia or hypoxia, lungs clear.  Most likely viral etiology, COVID swab obtained.  Will treat symptomatically and supportively with Tessalon and Mucinex DM.  Patient also has reported reflux issues, will provide  Maalox to use as needed.  Recommended continuing to use MiraLAX/Colace as needed for constipation.  Discussed strict return precautions. Patient verbalized understanding and is agreeable with plan.  Final Clinical Impressions(s) / UC Diagnoses   Final diagnoses:  Cough  Gastroesophageal reflux disease, esophagitis presence not specified     Discharge Instructions     We swabbed you for COVID.  This should return in 3 to 5 days.  For cough please use Tessalon every 8 hours as needed For congestion and mucus please try Mucinex DM twice daily  For reflux/burning sensation may use Maalox every 6-8 hours as needed Continue with MiraLAX, Colace/docusate to help regulate bowels  Please follow-up if cough persisting, developing shortness of breath, fevers, persistent symptoms, worsening reflux, chest discomfort or abdominal pain.   ED Prescriptions    Medication Sig Dispense Auth. Provider   benzonatate (TESSALON) 200 MG capsule Take 1 capsule (200 mg total) by mouth 3 (three) times daily as needed for up to 7 days for cough. 28 capsule Wieters, Hallie C, PA-C   dextromethorphan-guaiFENesin (MUCINEX DM) 30-600 MG 12hr tablet Take 1 tablet by mouth 2 (two) times daily. 30 tablet Wieters, Hallie C, PA-C   alum & mag hydroxide-simeth (MAALOX/MYLANTA) 200-200-20 MG/5ML suspension Take 15 mLs by mouth every 6 (six) hours as needed for indigestion or heartburn. 355 mL Wieters, Hallie C, PA-C     Controlled Substance Prescriptions [place] Controlled Substance Registry consulted? Not Applicable   Janith Lima, [name] 06/19/19 1201

## 2019-06-20 LAB — NOVEL CORONAVIRUS, NAA (HOSP ORDER, SEND-OUT TO REF LAB; TAT 18-24 HRS): SARS-CoV-2, NAA: NOT DETECTED

## 2019-08-26 ENCOUNTER — Other Ambulatory Visit: Payer: Self-pay | Admitting: Otolaryngology

## 2019-08-26 DIAGNOSIS — E049 Nontoxic goiter, unspecified: Secondary | ICD-10-CM

## 2019-09-02 ENCOUNTER — Other Ambulatory Visit: Payer: Self-pay | Admitting: Otolaryngology

## 2019-09-02 DIAGNOSIS — E049 Nontoxic goiter, unspecified: Secondary | ICD-10-CM

## 2019-09-04 ENCOUNTER — Encounter (INDEPENDENT_AMBULATORY_CARE_PROVIDER_SITE_OTHER): Payer: Self-pay

## 2019-09-20 ENCOUNTER — Other Ambulatory Visit: Payer: Self-pay

## 2019-09-20 ENCOUNTER — Telehealth (HOSPITAL_COMMUNITY): Payer: Self-pay | Admitting: Emergency Medicine

## 2019-09-20 ENCOUNTER — Ambulatory Visit (HOSPITAL_COMMUNITY)
Admission: EM | Admit: 2019-09-20 | Discharge: 2019-09-20 | Disposition: A | Discharge status: Home / Self Care | Payer: 59 | Attending: Family Medicine | Admitting: Family Medicine

## 2019-09-20 ENCOUNTER — Encounter (HOSPITAL_COMMUNITY): Payer: Self-pay | Admitting: *Deleted

## 2019-09-20 DIAGNOSIS — R1310 Dysphagia, unspecified: Secondary | ICD-10-CM | POA: Diagnosis present

## 2019-09-20 DIAGNOSIS — E049 Nontoxic goiter, unspecified: Secondary | ICD-10-CM | POA: Insufficient documentation

## 2019-09-20 DIAGNOSIS — E040 Nontoxic diffuse goiter: Secondary | ICD-10-CM

## 2019-09-20 HISTORY — DX: Nontoxic goiter, unspecified: E04.9

## 2019-09-20 LAB — TSH: TSH: 0.357 u[IU]/mL (ref 0.350–4.500)

## 2019-09-20 MED ORDER — CETIRIZINE HCL 10 MG PO TABS: 10.0000 mg | ORAL_TABLET | Freq: Every day | ORAL | 0 refills | Status: DC

## 2019-09-20 MED ORDER — HYDROXYZINE HCL 25 MG PO TABS: ORAL_TABLET | ORAL | 0 refills | Status: DC

## 2019-09-20 NOTE — ED Provider Notes (Signed)
[place]    CSN: [id number] Arrival date & time: 09/20/19  1042      History   Chief Complaint Chief Complaint  Patient presents with  . Dysphagia    HPI Randy Merritt is a 78 y.o. male.   HPI this is a very pleasant 78 year old gentleman who has been having dysphagia for about a year.  He has had multiple evaluations.  CT of the neck.  Barium swallow.  Speech therapy evaluation.  ENT fiberoptic laryngoscopy.  He states he has not been offered any treatment for this condition, and feels it is getting worse.  He has difficulty swallowing pills.  He states he does not recall when he had his speech therapy evaluation but they told him to swallow his pills with pudding or thickened liquid. He states it is difficult to swallow.  Sometimes is painful in his throat.  He has a large goiter.  He feels some days is bigger than others.  He states that he has had 2 neck surgeries and knows that he has "bone spurs" that also affect his swallowing.  He states that no one has talked to him about treating the goiter to try to improve his symptoms.  He states that lately he has been having to spit a lot.  He states that the fluid feels like it is collecting in the back of his throat.  He states that spitting is the reason that he came in today, he is tired of having to clear his saliva and secretions this way.  Past Medical History:  Diagnosis Date  . Acid reflux   . Anxiety   . Arthritis   . Depression    " a little"  . Goiter   . High cholesterol   . History of blood transfusion   . Seasonal allergies     Patient Active Problem List   Diagnosis Date Noted  . Ingrown toenail 10/11/2017  . 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 Oropharyngeal dysphagia 02/02/2017  . Chronic rhinitis 02/01/2017  . Gastroesophageal reflux disease 02/01/2017    Past Surgical History:  Procedure Laterality Date  . ANTERIOR CERVICAL DECOMP/DISCECTOMY FUSION N/A 04/08/2013    Procedure: ANTERIOR CERVICAL DECOMPRESSION/DISCECTOMY FUSION 1 LEVEL;  Surgeon: Floyce Stakes, MD;  Location: [facility];  Service: Neurosurgery;  Laterality: N/A;  Cervical five-six Anterior cervical decompression/diskectomy fusion  . BIOPSY  01/30/2019   Procedure: BIOPSY;  Surgeon: Carol Ada, MD;  Location: [facility];  Service: Endoscopy;;  . CERVICAL DISCECTOMY     x2  . CHOLECYSTECTOMY    . ESOPHAGEAL DILATION  01/30/2019   Procedure: ESOPHAGEAL DILATION;  Surgeon: Carol Ada, MD;  Location: [facility];  Service: Endoscopy;;  [place]  . ESOPHAGOGASTRODUODENOSCOPY (EGD) WITH PROPOFOL N/A 01/30/2019   Procedure: ESOPHAGOGASTRODUODENOSCOPY (EGD) WITH PROPOFOL;  Surgeon: Carol Ada, MD;  Location: [facility];  Service: Endoscopy;  Laterality: N/A;  . gallstone removal         Home Medications    Prior to Admission medications   Medication Sig Start Date End Date Taking? Authorizing Provider  alum & mag hydroxide-simeth (MAALOX/MYLANTA) 200-200-20 MG/5ML suspension Take 15 mLs by mouth every 6 (six) hours as needed for indigestion or heartburn. 06/19/19  Yes Wieters, Hallie C, PA-C  aspirin EC 81 MG tablet Take 81 mg by mouth daily.   Yes [provider]  cetirizine (ZYRTEC) 10 MG tablet Take 1 tablet (10 mg total) by mouth daily. 09/20/19   Blanchie Serve  Collie Siad, MD  finasteride (PROSCAR) 5 MG tablet Take 5 mg by mouth at bedtime.     [provider]  hydrOXYzine (ATARAX/VISTARIL) 25 MG tablet Take at night as needed for sleep 09/20/19   Raylene Everts, MD  ipratropium (ATROVENT) 0.06 % nasal spray Place 2 sprays into both nostrils 4 (four) times daily as needed for rhinitis (allergies).    [provider]  montelukast (SINGULAIR) 10 MG tablet Take 10 mg by mouth at bedtime.  12/06/18   [provider]  rosuvastatin (CRESTOR) 10 MG tablet Take 10 mg by mouth every evening.  11/19/18   [provider]  traZODone (DESYREL) 50 MG  tablet Take 50 mg by mouth at bedtime. 01/20/19   [provider]    Family History Family History  Problem Relation Age of Onset  . Healthy Mother   . Healthy Father     Social History Social History   Tobacco Use  . Smoking status: Former Research scientist (life sciences)  . Smokeless tobacco: Never Used  Substance Use Topics  . Alcohol use: No  . Drug use: No     Allergies   Patient has no known allergies.   Review of Systems Review of Systems  Constitutional: Negative for chills and fever.  HENT: Positive for postnasal drip and trouble swallowing. Negative for ear pain and sore throat.        Spitting"  Eyes: Negative for pain and visual disturbance.  Respiratory: Negative for cough and shortness of breath.   Cardiovascular: Negative for chest pain and palpitations.  Gastrointestinal: Negative for abdominal pain and vomiting.  Genitourinary: Negative for dysuria and hematuria.  Musculoskeletal: Positive for neck pain. Negative for arthralgias and back pain.  Skin: Negative for color change and rash.  Neurological: Negative for seizures and syncope.  All other systems reviewed and are negative.    Physical Exam Triage Vital Signs ED Triage Vitals [09/20/19 1053]  Enc Vitals Group     BP (!) 142/77     Pulse Rate 74     Resp 18     Temp 97.7 F (36.5 C)     Temp Source Other     SpO2 100 %     Weight      Height      Head Circumference      Peak Flow      Pain Score 0     Pain Loc      Pain Edu?      Excl. in [place]?    No data found.  Updated Vital Signs BP (!) 142/77   Pulse 74   Temp 97.7 F (36.5 C) (Other (Comment))   Resp 18   SpO2 100%      Physical Exam Constitutional:      General: He is not in acute distress.    Appearance: He is well-developed and normal weight.     Comments: Clears his throat frequently  HENT:     Head: Normocephalic and atraumatic.     Nose: Nose normal. No congestion.     Mouth/Throat:     Mouth: Mucous membranes are moist.   Eyes:     Conjunctiva/sclera: Conjunctivae normal.     Pupils: Pupils are equal, round, and reactive to light.  Neck:     Musculoskeletal: Normal range of motion and neck supple. Muscular tenderness present.     Comments: Large visible and palpable goiter Cardiovascular:     Rate and Rhythm: Normal rate and regular rhythm.  Heart sounds: Normal heart sounds.  Pulmonary:     Effort: Pulmonary effort is normal. No respiratory distress.     Breath sounds: Normal breath sounds.  Abdominal:     General: There is no distension.     Palpations: Abdomen is soft.  Musculoskeletal: Normal range of motion.  Skin:    General: Skin is warm and dry.  Neurological:     Mental Status: He is alert.  Psychiatric:        Mood and Affect: Mood normal.        Behavior: Behavior normal.      UC Treatments / Results  Labs (all labs ordered are listed, but only abnormal results are displayed) Labs Reviewed - No data to display  EKG   Radiology No results found.  Procedures Procedures (including critical care time)  Medications Ordered in UC Medications - No data to display  Initial Impression / Assessment and Plan / UC Course  I have reviewed the triage vital signs and the nursing notes.  Pertinent labs & imaging results that were available during my care of the patient were reviewed by me and considered in my medical decision making (see chart for details).     Reviewed the patient's chart.  I reviewed his CAT scan.  I showed the patient his CAT scan.  We discussed that he needs to follow-up with his usual providers. Final Clinical Impressions(s) / UC Diagnoses   Final diagnoses:  Dysphagia, unspecified type  Goiter diffuse     Discharge Instructions     I believe that the goiter may need to be treated to reduce the size.  I think this will help with your symptoms I am doing blood work to check the thyroid A thyroid specialist is an endocrinologist.  I am giving you the  number of one to see.  You may need for Dr Kenton Kingfisher to refer you. Call Dr Kenton Kingfisher to discuss the thyroid issue Taking an antihistamine may reduce the spitting.  Try zyrtec 10 mg daily    ED Prescriptions    Medication Sig Dispense Auth. Provider   cetirizine (ZYRTEC) 10 MG tablet Take 1 tablet (10 mg total) by mouth daily. 30 tablet Raylene Everts, MD   hydrOXYzine (ATARAX/VISTARIL) 25 MG tablet Take at night as needed for sleep 12 tablet Raylene Everts, MD     PDMP not reviewed this encounter.   Raylene Everts, MD 09/20/19 [phone]

## 2019-09-20 NOTE — Discharge Instructions (Addendum)
I believe that the goiter may need to be treated to reduce the size.  I think this will help with your symptoms I am doing blood work to check the thyroid A thyroid specialist is an endocrinologist.  I am giving you the number of one to see.  You may need for Dr Kenton Kingfisher to refer you. Call Dr Kenton Kingfisher to discuss the thyroid issue Taking an antihistamine may reduce the spitting.  Try zyrtec 10 mg daily

## 2019-09-20 NOTE — ED Triage Notes (Signed)
Pt reports ongoing problem of neck swelling and difficulty swallowing; has had multiple evals for same.  Pt noted to be clearing throat frequently.

## 2019-09-20 NOTE — Telephone Encounter (Signed)
Pt calling concerned he was supposed to get a steroid.  I went over his d/c instructions with him again.  He did say he picked up his medications so we discussed them while he had them in front of him.  Pt states he understands what he is supposed to take and when.

## 2019-10-06 ENCOUNTER — Encounter (HOSPITAL_COMMUNITY): Admission: EM | Disposition: A | Discharge status: Home Health | Payer: Self-pay | Source: Home / Self Care

## 2019-10-06 ENCOUNTER — Encounter (HOSPITAL_COMMUNITY): Payer: Self-pay | Admitting: Certified Registered Nurse Anesthetist

## 2019-10-06 ENCOUNTER — Emergency Department (HOSPITAL_COMMUNITY): Payer: 59 | Admitting: Anesthesiology

## 2019-10-06 ENCOUNTER — Inpatient Hospital Stay (HOSPITAL_COMMUNITY)
Admission: EM | Admit: 2019-10-06 | Discharge: 2019-10-15 | DRG: 329 | Disposition: A | Discharge status: Home Health | Payer: 59 | Attending: General Surgery | Admitting: General Surgery

## 2019-10-06 ENCOUNTER — Emergency Department (HOSPITAL_COMMUNITY): Payer: 59

## 2019-10-06 DIAGNOSIS — K631 Perforation of intestine (nontraumatic): Secondary | ICD-10-CM | POA: Diagnosis present

## 2019-10-06 DIAGNOSIS — K650 Generalized (acute) peritonitis: Secondary | ICD-10-CM | POA: Diagnosis present

## 2019-10-06 DIAGNOSIS — K567 Ileus, unspecified: Secondary | ICD-10-CM | POA: Diagnosis not present

## 2019-10-06 DIAGNOSIS — F419 Anxiety disorder, unspecified: Secondary | ICD-10-CM | POA: Diagnosis present

## 2019-10-06 DIAGNOSIS — N189 Chronic kidney disease, unspecified: Secondary | ICD-10-CM | POA: Diagnosis present

## 2019-10-06 DIAGNOSIS — E785 Hyperlipidemia, unspecified: Secondary | ICD-10-CM | POA: Diagnosis present

## 2019-10-06 DIAGNOSIS — K9189 Other postprocedural complications and disorders of digestive system: Secondary | ICD-10-CM | POA: Diagnosis not present

## 2019-10-06 DIAGNOSIS — R410 Disorientation, unspecified: Secondary | ICD-10-CM | POA: Diagnosis not present

## 2019-10-06 DIAGNOSIS — F329 Major depressive disorder, single episode, unspecified: Secondary | ICD-10-CM | POA: Diagnosis present

## 2019-10-06 DIAGNOSIS — K219 Gastro-esophageal reflux disease without esophagitis: Secondary | ICD-10-CM | POA: Diagnosis present

## 2019-10-06 DIAGNOSIS — D696 Thrombocytopenia, unspecified: Secondary | ICD-10-CM | POA: Diagnosis present

## 2019-10-06 DIAGNOSIS — J302 Other seasonal allergic rhinitis: Secondary | ICD-10-CM | POA: Diagnosis present

## 2019-10-06 DIAGNOSIS — Z981 Arthrodesis status: Secondary | ICD-10-CM | POA: Diagnosis not present

## 2019-10-06 DIAGNOSIS — Z87891 Personal history of nicotine dependence: Secondary | ICD-10-CM | POA: Diagnosis not present

## 2019-10-06 DIAGNOSIS — E876 Hypokalemia: Secondary | ICD-10-CM | POA: Diagnosis not present

## 2019-10-06 DIAGNOSIS — M1990 Unspecified osteoarthritis, unspecified site: Secondary | ICD-10-CM | POA: Diagnosis present

## 2019-10-06 DIAGNOSIS — D62 Acute posthemorrhagic anemia: Secondary | ICD-10-CM | POA: Diagnosis not present

## 2019-10-06 DIAGNOSIS — E7800 Pure hypercholesterolemia, unspecified: Secondary | ICD-10-CM | POA: Diagnosis present

## 2019-10-06 DIAGNOSIS — E049 Nontoxic goiter, unspecified: Secondary | ICD-10-CM | POA: Diagnosis present

## 2019-10-06 DIAGNOSIS — N179 Acute kidney failure, unspecified: Secondary | ICD-10-CM | POA: Diagnosis present

## 2019-10-06 DIAGNOSIS — Z7982 Long term (current) use of aspirin: Secondary | ICD-10-CM

## 2019-10-06 DIAGNOSIS — Z79899 Other long term (current) drug therapy: Secondary | ICD-10-CM

## 2019-10-06 DIAGNOSIS — Z20828 Contact with and (suspected) exposure to other viral communicable diseases: Secondary | ICD-10-CM | POA: Diagnosis present

## 2019-10-06 HISTORY — DX: Perforation of intestine (nontraumatic): K63.1

## 2019-10-06 HISTORY — PX: LAPAROTOMY: SHX154

## 2019-10-06 HISTORY — PX: APPENDECTOMY: SHX54

## 2019-10-06 HISTORY — PX: COLON RESECTION SIGMOID: SHX6737

## 2019-10-06 LAB — COMPREHENSIVE METABOLIC PANEL
ALT: 27 U/L (ref 0–44)
AST: 26 U/L (ref 15–41)
Albumin: 4.1 g/dL (ref 3.5–5.0)
Alkaline Phosphatase: 93 U/L (ref 38–126)
Anion gap: 8 (ref 5–15)
BUN: 15 mg/dL (ref 8–23)
CO2: 26 mmol/L (ref 22–32)
Calcium: 9.2 mg/dL (ref 8.9–10.3)
Chloride: 103 mmol/L (ref 98–111)
Creatinine, Ser: 1.69 mg/dL — ABNORMAL HIGH (ref 0.61–1.24)
GFR calc Af Amer: 44 mL/min — ABNORMAL LOW (ref >60)
GFR calc non Af Amer: 38 mL/min — ABNORMAL LOW (ref >60)
Glucose, Bld: 167 mg/dL — ABNORMAL HIGH (ref 70–99)
Potassium: 4.3 mmol/L (ref 3.5–5.1)
Sodium: 137 mmol/L (ref 135–145)
Total Bilirubin: 1.2 mg/dL (ref 0.3–1.2)
Total Protein: 6.9 g/dL (ref 6.5–8.1)

## 2019-10-06 LAB — CBC WITH DIFFERENTIAL/PLATELET
Abs Immature Granulocytes: 0 10*3/uL (ref 0.00–0.07)
Basophils Absolute: 0 10*3/uL (ref 0.0–0.1)
Basophils Relative: 0 %
Eosinophils Absolute: 0 10*3/uL (ref 0.0–0.5)
Eosinophils Relative: 1 %
HCT: 41.1 % (ref 39.0–52.0)
Hemoglobin: 13.8 g/dL (ref 13.0–17.0)
Immature Granulocytes: 0 %
Lymphocytes Relative: 29 %
Lymphs Abs: 0.9 10*3/uL (ref 0.7–4.0)
MCH: 32.6 pg (ref 26.0–34.0)
MCHC: 33.6 g/dL (ref 30.0–36.0)
MCV: 97.2 fL (ref 80.0–100.0)
Monocytes Absolute: 0.1 10*3/uL (ref 0.1–1.0)
Monocytes Relative: 4 %
Neutro Abs: 2.2 10*3/uL (ref 1.7–7.7)
Neutrophils Relative %: 66 %
Platelets: 157 10*3/uL (ref 150–400)
RBC: 4.23 MIL/uL (ref 4.22–5.81)
RDW: 12.5 % (ref 11.5–15.5)
WBC: 3.3 10*3/uL — ABNORMAL LOW (ref 4.0–10.5)
nRBC: 0 % (ref 0.0–0.2)

## 2019-10-06 LAB — TYPE AND SCREEN
ABO/RH(D): A POS
Antibody Screen: NEGATIVE

## 2019-10-06 LAB — ABO/RH: ABO/RH(D): A POS

## 2019-10-06 LAB — SARS CORONAVIRUS 2 BY RT PCR (HOSPITAL ORDER, PERFORMED IN ~~LOC~~ HOSPITAL LAB): SARS Coronavirus 2: NEGATIVE

## 2019-10-06 SURGERY — LAPAROTOMY, EXPLORATORY
Anesthesia: General | Site: Abdomen

## 2019-10-06 MED ORDER — ROCURONIUM BROMIDE 10 MG/ML (PF) SYRINGE
PREFILLED_SYRINGE | INTRAVENOUS | Status: AC
  Filled 2019-10-06: qty 10

## 2019-10-06 MED ORDER — ENOXAPARIN SODIUM 40 MG/0.4ML ~~LOC~~ SOLN
40.0000 mg | SUBCUTANEOUS | Status: DC
  Administered 2019-10-07 – 2019-10-10 (×4): 40 mg via SUBCUTANEOUS
  Filled 2019-10-06 (×4): qty 0.4

## 2019-10-06 MED ORDER — METOPROLOL TARTRATE 5 MG/5ML IV SOLN: 5.0000 mg | Freq: Four times a day (QID) | INTRAVENOUS | Status: DC | PRN

## 2019-10-06 MED ORDER — 0.9 % SODIUM CHLORIDE (POUR BTL) OPTIME
TOPICAL | Status: DC | PRN
  Administered 2019-10-06: 3000 mL

## 2019-10-06 MED ORDER — SODIUM CHLORIDE 0.9 % IV BOLUS
1000.0000 mL | Freq: Once | INTRAVENOUS | Status: AC
  Administered 2019-10-06: 1000 mL via INTRAVENOUS

## 2019-10-06 MED ORDER — METHOCARBAMOL 500 MG PO TABS
500.0000 mg | ORAL_TABLET | Freq: Four times a day (QID) | ORAL | Status: DC | PRN
  Administered 2019-10-06 – 2019-10-07 (×2): 500 mg via ORAL
  Filled 2019-10-06 (×2): qty 1

## 2019-10-06 MED ORDER — ACETAMINOPHEN 10 MG/ML IV SOLN
INTRAVENOUS | Status: DC | PRN
  Administered 2019-10-06: 1000 mg via INTRAVENOUS

## 2019-10-06 MED ORDER — OXYCODONE HCL 5 MG/5ML PO SOLN: 5.0000 mg | Freq: Once | ORAL | Status: DC | PRN

## 2019-10-06 MED ORDER — STERILE WATER FOR IRRIGATION IR SOLN
Status: DC | PRN
  Administered 2019-10-06: 1000 mL

## 2019-10-06 MED ORDER — PROPOFOL 10 MG/ML IV BOLUS
INTRAVENOUS | Status: AC
  Filled 2019-10-06: qty 20

## 2019-10-06 MED ORDER — PIPERACILLIN-TAZOBACTAM 3.375 G IVPB
3.3750 g | Freq: Three times a day (TID) | INTRAVENOUS | Status: DC
  Administered 2019-10-06 – 2019-10-14 (×24): 3.375 g via INTRAVENOUS
  Filled 2019-10-06 (×23): qty 50

## 2019-10-06 MED ORDER — OXYCODONE HCL 5 MG PO TABS
5.0000 mg | ORAL_TABLET | ORAL | Status: DC | PRN
  Administered 2019-10-06: 10 mg via ORAL
  Administered 2019-10-07 – 2019-10-08 (×2): 5 mg via ORAL
  Administered 2019-10-12: 10 mg via ORAL
  Administered 2019-10-13: 5 mg via ORAL
  Filled 2019-10-06: qty 1
  Filled 2019-10-06: qty 2
  Filled 2019-10-06: qty 1
  Filled 2019-10-06 (×3): qty 2

## 2019-10-06 MED ORDER — ONDANSETRON HCL 4 MG/2ML IJ SOLN: 4.0000 mg | Freq: Once | INTRAMUSCULAR | Status: DC | PRN

## 2019-10-06 MED ORDER — MORPHINE SULFATE (PF) 4 MG/ML IV SOLN
4.0000 mg | Freq: Once | INTRAVENOUS | Status: AC
  Administered 2019-10-06: 4 mg via INTRAVENOUS
  Filled 2019-10-06: qty 1

## 2019-10-06 MED ORDER — LACTATED RINGERS IV SOLN
INTRAVENOUS | Status: DC | PRN
  Administered 2019-10-06 (×2): via INTRAVENOUS

## 2019-10-06 MED ORDER — OXYCODONE HCL 5 MG PO TABS: 5.0000 mg | ORAL_TABLET | Freq: Once | ORAL | Status: DC | PRN

## 2019-10-06 MED ORDER — SUCCINYLCHOLINE CHLORIDE 200 MG/10ML IV SOSY
PREFILLED_SYRINGE | INTRAVENOUS | Status: DC | PRN
  Administered 2019-10-06: 140 mg via INTRAVENOUS

## 2019-10-06 MED ORDER — OXYCODONE HCL 5 MG/5ML PO SOLN: 5.0000 mg | Freq: Once | ORAL | Status: AC | PRN

## 2019-10-06 MED ORDER — SUGAMMADEX SODIUM 200 MG/2ML IV SOLN
INTRAVENOUS | Status: DC | PRN
  Administered 2019-10-06 (×2): 100 mg via INTRAVENOUS

## 2019-10-06 MED ORDER — FENTANYL CITRATE (PF) 100 MCG/2ML IJ SOLN
INTRAMUSCULAR | Status: AC
  Filled 2019-10-06: qty 2

## 2019-10-06 MED ORDER — PANTOPRAZOLE SODIUM 40 MG IV SOLR
40.0000 mg | Freq: Every day | INTRAVENOUS | Status: DC
  Administered 2019-10-06 – 2019-10-11 (×6): 40 mg via INTRAVENOUS
  Filled 2019-10-06 (×6): qty 40

## 2019-10-06 MED ORDER — ACETAMINOPHEN 10 MG/ML IV SOLN
INTRAVENOUS | Status: AC
  Filled 2019-10-06: qty 100

## 2019-10-06 MED ORDER — MEPERIDINE HCL 25 MG/ML IJ SOLN: 6.2500 mg | INTRAMUSCULAR | Status: DC | PRN

## 2019-10-06 MED ORDER — KETOROLAC TROMETHAMINE 15 MG/ML IJ SOLN: 15.0000 mg | Freq: Four times a day (QID) | INTRAMUSCULAR | Status: DC | PRN

## 2019-10-06 MED ORDER — ONDANSETRON HCL 4 MG/2ML IJ SOLN: 4.0000 mg | Freq: Four times a day (QID) | INTRAMUSCULAR | Status: DC | PRN

## 2019-10-06 MED ORDER — ROSUVASTATIN CALCIUM 5 MG PO TABS
10.0000 mg | ORAL_TABLET | Freq: Every evening | ORAL | Status: DC
  Administered 2019-10-07 – 2019-10-14 (×8): 10 mg via ORAL
  Filled 2019-10-06 (×8): qty 2

## 2019-10-06 MED ORDER — LACTATED RINGERS IV SOLN
INTRAVENOUS | Status: DC
  Administered 2019-10-06: 12:00:00 via INTRAVENOUS

## 2019-10-06 MED ORDER — ACETAMINOPHEN 500 MG PO TABS
1000.0000 mg | ORAL_TABLET | Freq: Four times a day (QID) | ORAL | Status: DC
  Administered 2019-10-06 – 2019-10-15 (×25): 1000 mg via ORAL
  Filled 2019-10-06 (×35): qty 2

## 2019-10-06 MED ORDER — ACETAMINOPHEN 325 MG PO TABS: 325.0000 mg | ORAL_TABLET | ORAL | Status: DC | PRN

## 2019-10-06 MED ORDER — FENTANYL CITRATE (PF) 250 MCG/5ML IJ SOLN
INTRAMUSCULAR | Status: AC
  Filled 2019-10-06: qty 5

## 2019-10-06 MED ORDER — ACETAMINOPHEN 160 MG/5ML PO SOLN: 325.0000 mg | ORAL | Status: DC | PRN

## 2019-10-06 MED ORDER — IPRATROPIUM BROMIDE 0.06 % NA SOLN
2.0000 | Freq: Four times a day (QID) | NASAL | Status: DC | PRN
  Filled 2019-10-06: qty 15

## 2019-10-06 MED ORDER — FINASTERIDE 5 MG PO TABS
5.0000 mg | ORAL_TABLET | Freq: Every day | ORAL | Status: DC
  Administered 2019-10-06 – 2019-10-14 (×9): 5 mg via ORAL
  Filled 2019-10-06 (×9): qty 1

## 2019-10-06 MED ORDER — SUCCINYLCHOLINE CHLORIDE 200 MG/10ML IV SOSY
PREFILLED_SYRINGE | INTRAVENOUS | Status: AC
  Filled 2019-10-06: qty 10

## 2019-10-06 MED ORDER — PHENYLEPHRINE 40 MCG/ML (10ML) SYRINGE FOR IV PUSH (FOR BLOOD PRESSURE SUPPORT)
PREFILLED_SYRINGE | INTRAVENOUS | Status: DC | PRN
  Administered 2019-10-06 (×3): 80 ug via INTRAVENOUS
  Administered 2019-10-06: 120 ug via INTRAVENOUS

## 2019-10-06 MED ORDER — ONDANSETRON HCL 4 MG/2ML IJ SOLN
INTRAMUSCULAR | Status: AC
  Filled 2019-10-06: qty 2

## 2019-10-06 MED ORDER — PROPOFOL 10 MG/ML IV BOLUS
INTRAVENOUS | Status: DC | PRN
  Administered 2019-10-06: 150 mg via INTRAVENOUS

## 2019-10-06 MED ORDER — MORPHINE SULFATE (PF) 2 MG/ML IV SOLN: 1.0000 mg | INTRAVENOUS | Status: DC | PRN

## 2019-10-06 MED ORDER — FENTANYL CITRATE (PF) 100 MCG/2ML IJ SOLN
25.0000 ug | INTRAMUSCULAR | Status: DC | PRN
  Administered 2019-10-06 (×3): 50 ug via INTRAVENOUS

## 2019-10-06 MED ORDER — LORATADINE 10 MG PO TABS
10.0000 mg | ORAL_TABLET | Freq: Every day | ORAL | Status: DC
  Administered 2019-10-07 – 2019-10-15 (×9): 10 mg via ORAL
  Filled 2019-10-06 (×9): qty 1

## 2019-10-06 MED ORDER — HYDROXYZINE HCL 25 MG PO TABS: 25.0000 mg | ORAL_TABLET | Freq: Every evening | ORAL | Status: DC | PRN

## 2019-10-06 MED ORDER — MONTELUKAST SODIUM 10 MG PO TABS
10.0000 mg | ORAL_TABLET | Freq: Every day | ORAL | Status: DC
  Administered 2019-10-06 – 2019-10-14 (×9): 10 mg via ORAL
  Filled 2019-10-06 (×9): qty 1

## 2019-10-06 MED ORDER — CHLORHEXIDINE GLUCONATE CLOTH 2 % EX PADS: 6.0000 | MEDICATED_PAD | Freq: Once | CUTANEOUS | Status: DC

## 2019-10-06 MED ORDER — ONDANSETRON HCL 4 MG/2ML IJ SOLN
4.0000 mg | Freq: Once | INTRAMUSCULAR | Status: AC
  Administered 2019-10-06: 4 mg via INTRAVENOUS
  Filled 2019-10-06: qty 2

## 2019-10-06 MED ORDER — FENTANYL CITRATE (PF) 100 MCG/2ML IJ SOLN: 25.0000 ug | INTRAMUSCULAR | Status: DC | PRN

## 2019-10-06 MED ORDER — LIDOCAINE 2% (20 MG/ML) 5 ML SYRINGE
INTRAMUSCULAR | Status: DC | PRN
  Administered 2019-10-06: 80 mg via INTRAVENOUS

## 2019-10-06 MED ORDER — ROCURONIUM BROMIDE 10 MG/ML (PF) SYRINGE
PREFILLED_SYRINGE | INTRAVENOUS | Status: DC | PRN
  Administered 2019-10-06 (×2): 10 mg via INTRAVENOUS
  Administered 2019-10-06: 40 mg via INTRAVENOUS

## 2019-10-06 MED ORDER — OXYCODONE HCL 5 MG PO TABS
5.0000 mg | ORAL_TABLET | Freq: Once | ORAL | Status: AC | PRN
  Administered 2019-10-06: 5 mg via ORAL

## 2019-10-06 MED ORDER — ONDANSETRON 4 MG PO TBDP
4.0000 mg | ORAL_TABLET | Freq: Four times a day (QID) | ORAL | Status: DC | PRN
  Administered 2019-10-10 – 2019-10-15 (×2): 4 mg via ORAL
  Filled 2019-10-06 (×2): qty 1

## 2019-10-06 MED ORDER — FENTANYL CITRATE (PF) 250 MCG/5ML IJ SOLN
INTRAMUSCULAR | Status: DC | PRN
  Administered 2019-10-06 (×4): 50 ug via INTRAVENOUS

## 2019-10-06 MED ORDER — ASPIRIN EC 81 MG PO TBEC
81.0000 mg | DELAYED_RELEASE_TABLET | Freq: Every day | ORAL | Status: DC
  Administered 2019-10-07 – 2019-10-15 (×9): 81 mg via ORAL
  Filled 2019-10-06 (×9): qty 1

## 2019-10-06 MED ORDER — OXYCODONE HCL 5 MG PO TABS
ORAL_TABLET | ORAL | Status: AC
  Filled 2019-10-06: qty 1

## 2019-10-06 MED ORDER — ONDANSETRON HCL 4 MG/2ML IJ SOLN
INTRAMUSCULAR | Status: DC | PRN
  Administered 2019-10-06: 4 mg via INTRAVENOUS

## 2019-10-06 MED ORDER — PHENYLEPHRINE 40 MCG/ML (10ML) SYRINGE FOR IV PUSH (FOR BLOOD PRESSURE SUPPORT)
PREFILLED_SYRINGE | INTRAVENOUS | Status: AC
  Filled 2019-10-06: qty 10

## 2019-10-06 MED ORDER — TRAZODONE HCL 50 MG PO TABS
50.0000 mg | ORAL_TABLET | Freq: Every day | ORAL | Status: DC
  Administered 2019-10-06 – 2019-10-14 (×9): 50 mg via ORAL
  Filled 2019-10-06 (×9): qty 1

## 2019-10-06 MED ORDER — SODIUM CHLORIDE 0.9 % IV SOLN
INTRAVENOUS | Status: DC
  Administered 2019-10-07 – 2019-10-10 (×6): via INTRAVENOUS

## 2019-10-06 MED ORDER — PHENYLEPHRINE HCL-NACL 10-0.9 MG/250ML-% IV SOLN
INTRAVENOUS | Status: DC | PRN
  Administered 2019-10-06: 30 ug/min via INTRAVENOUS

## 2019-10-06 MED ORDER — ALUM & MAG HYDROXIDE-SIMETH 200-200-20 MG/5ML PO SUSP
15.0000 mL | Freq: Four times a day (QID) | ORAL | Status: DC | PRN
  Administered 2019-10-11 – 2019-10-14 (×3): 15 mL via ORAL
  Filled 2019-10-06 (×3): qty 30

## 2019-10-06 MED ORDER — LACTATED RINGERS IV SOLN
INTRAVENOUS | Status: DC | PRN
  Administered 2019-10-06: 14:00:00 via INTRAVENOUS

## 2019-10-06 MED ORDER — LIDOCAINE 2% (20 MG/ML) 5 ML SYRINGE
INTRAMUSCULAR | Status: AC
  Filled 2019-10-06: qty 5

## 2019-10-06 SURGICAL SUPPLY — 61 items
APL PRP STRL LF DISP 70% ISPRP (MISCELLANEOUS) ×1
BLADE CLIPPER SURG (BLADE) IMPLANT
CANISTER SUCT 3000ML PPV (MISCELLANEOUS) ×3 IMPLANT
CHLORAPREP W/TINT 26 (MISCELLANEOUS) ×3 IMPLANT
COVER SURGICAL LIGHT HANDLE (MISCELLANEOUS) ×3 IMPLANT
COVER WAND RF STERILE (DRAPES) ×3 IMPLANT
DRAPE LAPAROSCOPIC ABDOMINAL (DRAPES) ×3 IMPLANT
DRAPE WARM FLUID 44X44 (DRAPES) ×3 IMPLANT
DRSG OPSITE POSTOP 4X10 (GAUZE/BANDAGES/DRESSINGS) IMPLANT
DRSG OPSITE POSTOP 4X12 (GAUZE/BANDAGES/DRESSINGS) ×2 IMPLANT
DRSG OPSITE POSTOP 4X8 (GAUZE/BANDAGES/DRESSINGS) IMPLANT
ELECT BLADE 4.0 EZ CLEAN MEGAD (MISCELLANEOUS) ×3
ELECT BLADE 6.5 EXT (BLADE) ×2 IMPLANT
ELECT CAUTERY BLADE 6.4 (BLADE) ×3 IMPLANT
ELECT REM PT RETURN 9FT ADLT (ELECTROSURGICAL) ×3
ELECTRODE BLDE 4.0 EZ CLN MEGD (MISCELLANEOUS) IMPLANT
ELECTRODE REM PT RTRN 9FT ADLT (ELECTROSURGICAL) ×1 IMPLANT
GLOVE BIO SURGEON STRL SZ 6 (GLOVE) ×3 IMPLANT
GLOVE BIOGEL PI IND STRL 6 (GLOVE) IMPLANT
GLOVE BIOGEL PI IND STRL 6.5 (GLOVE) IMPLANT
GLOVE BIOGEL PI INDICATOR 6 (GLOVE) ×2
GLOVE BIOGEL PI INDICATOR 6.5 (GLOVE) ×2
GLOVE INDICATOR 6.5 STRL GRN (GLOVE) ×3 IMPLANT
GOWN STRL REUS W/ TWL LRG LVL3 (GOWN DISPOSABLE) ×2 IMPLANT
GOWN STRL REUS W/TWL LRG LVL3 (GOWN DISPOSABLE) ×6
HANDLE SUCTION POOLE (INSTRUMENTS) ×1 IMPLANT
KIT BASIN OR (CUSTOM PROCEDURE TRAY) ×3 IMPLANT
KIT SIGMOIDOSCOPE (SET/KITS/TRAYS/PACK) ×2 IMPLANT
KIT TURNOVER KIT B (KITS) ×3 IMPLANT
LIGASURE IMPACT 36 18CM CVD LR (INSTRUMENTS) ×2 IMPLANT
NS IRRIG 1000ML POUR BTL (IV SOLUTION) ×6 IMPLANT
PACK GENERAL/GYN (CUSTOM PROCEDURE TRAY) ×3 IMPLANT
PAD ARMBOARD 7.5X6 YLW CONV (MISCELLANEOUS) ×3 IMPLANT
PENCIL SMOKE EVACUATOR (MISCELLANEOUS) ×3 IMPLANT
RELOAD PROXIMATE 75MM BLUE (ENDOMECHANICALS) ×3 IMPLANT
RELOAD STAPLE 75 3.8 BLU REG (ENDOMECHANICALS) IMPLANT
RETRACTOR WND ALEXIS 25 LRG (MISCELLANEOUS) IMPLANT
RTRCTR WOUND ALEXIS 25CM LRG (MISCELLANEOUS) ×3
SLEEVE SUCTION 125 (MISCELLANEOUS) ×2 IMPLANT
SPECIMEN JAR LARGE (MISCELLANEOUS) IMPLANT
SPONGE LAP 18X18 RF (DISPOSABLE) IMPLANT
STAPLER CIRC CVD 29MM 37CM (STAPLE) ×2 IMPLANT
STAPLER CUT CVD 40MM GREEN (STAPLE) ×2 IMPLANT
STAPLER CUT RELOAD GREEN (STAPLE) ×4 IMPLANT
STAPLER PROXIMATE 75MM BLUE (STAPLE) ×2 IMPLANT
STAPLER VISISTAT 35W (STAPLE) ×3 IMPLANT
SUCTION POOLE HANDLE (INSTRUMENTS) ×3
SUT PDS AB 1 TP1 96 (SUTURE) ×6 IMPLANT
SUT PROLENE 2 0 CT2 30 (SUTURE) ×2 IMPLANT
SUT SILK 2 0 SH CR/8 (SUTURE) ×3 IMPLANT
SUT SILK 2 0 TIES 10X30 (SUTURE) ×3 IMPLANT
SUT SILK 3 0 SH CR/8 (SUTURE) ×3 IMPLANT
SUT SILK 3 0 TIES 10X30 (SUTURE) ×3 IMPLANT
SUT VIC AB 3-0 SH 18 (SUTURE) IMPLANT
SYR BULB IRRIGATION 50ML (SYRINGE) ×2 IMPLANT
TOWEL GREEN STERILE (TOWEL DISPOSABLE) ×3 IMPLANT
TRAY FOLEY MTR SLVR 16FR STAT (SET/KITS/TRAYS/PACK) IMPLANT
TRAY FOLEY W/BAG SLVR 14FR (SET/KITS/TRAYS/PACK) ×2 IMPLANT
TUBE CONNECTING 12'X1/4 (SUCTIONS) ×1
TUBE CONNECTING 12X1/4 (SUCTIONS) ×1 IMPLANT
YANKAUER SUCT BULB TIP NO VENT (SUCTIONS) ×2 IMPLANT

## 2019-10-06 NOTE — Progress Notes (Signed)
Admitted from PACU post abdominal surgery, alert and oriented , VSS. Oriented to unit and staff. Dressing to abdomen noted to be with bloody drainage. Will endorse appropriately.

## 2019-10-06 NOTE — Progress Notes (Signed)
Called pharmacy regarding Zosyn.  Per pharmacy it will be brought to short stay.

## 2019-10-06 NOTE — ED Provider Notes (Signed)
[facility] Provider Note   CSN: [id number] Arrival date & time: 10/06/19  1012     History   Chief Complaint Chief Complaint  Patient presents with   Abdominal Pain    perf bowel -from surgical center    HPI Randy Merritt is a 78 y.o. male with PMH significant for HLD, thyroid goiter, depression/anxiety, GERD who presents from surgical suite for bowel perforation in sigmoid colon seen during screening colonoscopy earlier this morning. Was pulling camera out at completion when perforation was noticed. Patient was started on LR IVF and Unasyn and presented to [facility]. On presentation, patient endorsing significant abdominal pain and distension.  Denies nausea, vomiting, chest pain.    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   Oropharyngeal dysphagia 02/02/2017   Chronic rhinitis 02/01/2017   Gastroesophageal reflux disease 02/01/2017    Past Surgical History:  Procedure Laterality Date   ANTERIOR CERVICAL DECOMP/DISCECTOMY FUSION N/A 04/08/2013   Procedure: ANTERIOR CERVICAL DECOMPRESSION/DISCECTOMY FUSION 1 LEVEL;  Surgeon: Floyce Stakes, MD;  Location: [facility];  Service: Neurosurgery;  Laterality: N/A;  Cervical five-six Anterior cervical decompression/diskectomy fusion   BIOPSY  01/30/2019   Procedure: BIOPSY;  Surgeon: Carol Ada, MD;  Location: [facility];  Service: Endoscopy;;   CERVICAL DISCECTOMY     x2   CHOLECYSTECTOMY     ESOPHAGEAL DILATION  01/30/2019   Procedure: ESOPHAGEAL DILATION;  Surgeon: Carol Ada, MD;  Location: [facility];  Service: Endoscopy;;  [place]   ESOPHAGOGASTRODUODENOSCOPY  (EGD) WITH PROPOFOL N/A 01/30/2019   Procedure: ESOPHAGOGASTRODUODENOSCOPY (EGD) WITH PROPOFOL;  Surgeon: Carol Ada, MD;  Location: [facility];  Service: Endoscopy;  Laterality: N/A;   gallstone removal          Home Medications    Prior to Admission medications   Medication Sig Start Date End Date Taking? Authorizing Provider  alum & mag hydroxide-simeth (MAALOX/MYLANTA) 200-200-20 MG/5ML suspension Take 15 mLs by mouth every 6 (six) hours as needed for indigestion or heartburn. 06/19/19   Wieters, Hallie C, PA-C  aspirin EC 81 MG tablet Take 81 mg by mouth daily.    [provider]  cetirizine (ZYRTEC) 10 MG tablet Take 1 tablet (10 mg total) by mouth daily. 09/20/19   Raylene Everts, MD  finasteride (PROSCAR) 5 MG tablet Take 5 mg by mouth at bedtime.     [provider]  hydrOXYzine (ATARAX/VISTARIL) 25 MG tablet Take at night as needed for sleep 09/20/19   Raylene Everts, MD  ipratropium (ATROVENT) 0.06 % nasal spray Place 2 sprays into both nostrils 4 (four) times daily as needed for rhinitis (allergies).    [provider]  montelukast (SINGULAIR) 10 MG tablet Take 10 mg by mouth at bedtime.  12/06/18   [provider]  rosuvastatin (CRESTOR) 10 MG tablet Take 10 mg by mouth every evening.  11/19/18   [provider]  traZODone (DESYREL) 50 MG tablet Take 50 mg by mouth at bedtime. 01/20/19   [provider]    Family History Family History  Problem Relation Age of  Onset   Healthy Mother    Healthy Father     Social History Social History   Tobacco Use   Smoking status: Former Smoker   Smokeless tobacco: Never Used  Substance Use Topics   Alcohol use: No   Drug use: No     Allergies   Patient has no known allergies.   Review of Systems Review of Systems  Respiratory: Negative for shortness of breath.   Cardiovascular: Negative for chest pain.  Gastrointestinal: Positive for abdominal distention  and abdominal pain. Negative for nausea and vomiting.   Physical Exam Updated Vital Signs BP 136/77 (BP Location: Right Arm)    Pulse (!) 57    Temp (!) 97.5 F (36.4 C) (Oral)    Resp 18    Ht 5\' 8"  (1.727 m)    Wt 68 kg    SpO2 98%    BMI 22.81 kg/m   Physical Exam Constitutional:      General: He is in acute distress.     Appearance: He is ill-appearing. He is not diaphoretic.  HENT:     Head: Normocephalic.  Cardiovascular:     Rate and Rhythm: Normal rate and regular rhythm.     Heart sounds: Normal heart sounds.  Pulmonary:     Breath sounds: Normal breath sounds. No wheezing or rales.  Abdominal:     General: Bowel sounds are decreased. There is distension.     Tenderness: There is generalized abdominal tenderness.  Skin:    General: Skin is warm and dry.  Neurological:     Mental Status: He is alert.    ED Treatments / Results  Labs (all labs ordered are listed, but only abnormal results are displayed) Labs Reviewed  SARS CORONAVIRUS 2 BY RT PCR (HOSPITAL ORDER, [place] [facility])  URINALYSIS, ROUTINE W REFLEX MICROSCOPIC  TYPE AND SCREEN    EKG None  Radiology Dg Chest Portable 1 View  Result Date: 10/06/2019 CLINICAL DATA:  78 year old male with history of perforated bowel. Lower abdominal pain. EXAM: PORTABLE CHEST 1 VIEW COMPARISON:  Chest x-ray 01/28/2017. FINDINGS: Large amount of pneumoperitoneum noted beneath the diaphragm. Lung volumes are low. Irregular opacities at the left lung base may reflect atelectasis and/or consolidation. Right lung is clear. No pleural effusions. No evidence of pulmonary edema. No pneumothorax. Heart size is normal. Upper mediastinal contours are within normal limits. IMPRESSION: 1. Large volume of pneumoperitoneum compatible with reported clinical history of perforated bowel. 2. Low lung volumes with atelectasis and/or consolidation in the left lower lobe. Electronically Signed   By: Vinnie Langton M.D.    On: 10/06/2019 10:38    Procedures Procedures (including critical care time)  Medications Ordered in ED Medications  Chlorhexidine Gluconate Cloth 2 % PADS 6 each (has no administration in time range)    And  Chlorhexidine Gluconate Cloth 2 % PADS 6 each (has no administration in time range)  piperacillin-tazobactam (ZOSYN) IVPB 3.375 g (has no administration in time range)  sodium chloride 0.9 % bolus 1,000 mL (1,000 mLs Intravenous New Bag/Given 10/06/19 1042)  morphine 4 MG/ML injection 4 mg (4 mg Intravenous Given 10/06/19 1040)  ondansetron (ZOFRAN) injection 4 mg (4 mg Intravenous Given 10/06/19 1041)     Initial Impression / Assessment and Plan / ED Course  I have reviewed the triage vital signs and the nursing notes.  Pertinent labs & imaging results that were available during my care of the patient were reviewed by me and  considered in my medical decision making (see chart for details).   78 year old male with history of HLD, thyroid goiter, depression/anxiety, GERD who presents from surgical suite with bowel perforation seen during screening colonoscopy.  Op note not available in EMR at time of exam.  On presentation, vitals within normal limits, however abdomen with significant distention and pain made worse by any slight movement.  Decreased bowel sounds throughout.  Patient was started on IV fluids and received 1 dose of Unasyn prior to presentation.  Portable chest x-ray was obtained which showed a large amount of free air noted beneath the diaphragm. General surgery was consulted shortly after presentation and will admit for surgical repair.     Final Clinical Impressions(s) / ED Diagnoses   Final diagnoses:  Bowel perforation [facility])    ED Discharge Orders    None       Rory Percy, DO 10/06/19 1052    Isla Pence, MD 10/06/19 1236

## 2019-10-06 NOTE — H&P (Signed)
[facility] Surgery Admission Note  Randy Merritt 1941/10/09  [id number].    Requesting MD: Isla Pence Chief Complaint/Reason for Consult: perforated bowel  HPI:  Randy Merritt is a 78yo male PMH anxiety/depression, HLD, seasonal allergies, and GERD who presented to [facility] today from surgical center where he underwent colonoscopy today. Was having a screening colonoscopy per his report. Denies recent hx of diverticulitis or blood in stools. He was noted to have a bowel perforation in sigmoid colon while pulling camera out at completion of colonoscopy. Patient was started on LR IVF and Unasyn and presented to [facility]. Patient complaining of severe diffuse abdominal pain and SOB. Denies nausea currently. Patient reports prior cholecystectomy. Takes occasional medication for GERD but no anticoagulants. NKDA. Former smoker. Patient lives with his wife, [name]. He reports that he does accept blood products if needed.    ROS: Review of Systems  Constitutional: Negative for chills and fever.  Respiratory: Positive for shortness of breath.   Cardiovascular: Negative for chest pain and palpitations.  Gastrointestinal: Positive for abdominal pain. Negative for blood in stool, melena, nausea and vomiting.  Musculoskeletal: Positive for neck pain (chronic ).  All other systems reviewed and are negative.     Family History  Problem Relation Age of Onset  . Healthy Mother   . Healthy Father     Past Medical History:  Diagnosis Date  . Acid reflux   . Anxiety   . Arthritis   . Depression    " a little"  . Goiter   . High cholesterol   . History of blood transfusion   . Seasonal allergies     Past Surgical History:  Procedure Laterality Date  . ANTERIOR CERVICAL DECOMP/DISCECTOMY FUSION N/A 04/08/2013   Procedure: ANTERIOR CERVICAL DECOMPRESSION/DISCECTOMY FUSION 1 LEVEL;  Surgeon: Floyce Stakes, MD;  Location: [facility];  Service: Neurosurgery;  Laterality: N/A;  Cervical  five-six Anterior cervical decompression/diskectomy fusion  . BIOPSY  01/30/2019   Procedure: BIOPSY;  Surgeon: Carol Ada, MD;  Location: [facility];  Service: Endoscopy;;  . CERVICAL DISCECTOMY     x2  . CHOLECYSTECTOMY    . ESOPHAGEAL DILATION  01/30/2019   Procedure: ESOPHAGEAL DILATION;  Surgeon: Carol Ada, MD;  Location: [facility];  Service: Endoscopy;;  [place]  . ESOPHAGOGASTRODUODENOSCOPY (EGD) WITH PROPOFOL N/A 01/30/2019   Procedure: ESOPHAGOGASTRODUODENOSCOPY (EGD) WITH PROPOFOL;  Surgeon: Carol Ada, MD;  Location: [facility];  Service: Endoscopy;  Laterality: N/A;  . gallstone removal      Social History:  reports that he has quit smoking. He has never used smokeless tobacco. He reports that he does not drink alcohol or use drugs.  Allergies: No Known Allergies  (Not in a hospital admission)   Prior to Admission medications   Medication Sig Start Date End Date Taking? Authorizing Provider  alum & mag hydroxide-simeth (MAALOX/MYLANTA) 200-200-20 MG/5ML suspension Take 15 mLs by mouth every 6 (six) hours as needed for indigestion or heartburn. 06/19/19   Wieters, Hallie C, PA-C  aspirin EC 81 MG tablet Take 81 mg by mouth daily.    [provider]  cetirizine (ZYRTEC) 10 MG tablet Take 1 tablet (10 mg total) by mouth daily. 09/20/19   Raylene Everts, MD  finasteride (PROSCAR) 5 MG tablet Take 5 mg by mouth at bedtime.     [provider]  hydrOXYzine (ATARAX/VISTARIL) 25 MG tablet Take at night as needed for sleep 09/20/19   Raylene Everts, MD  ipratropium (ATROVENT) 0.06 %  nasal spray Place 2 sprays into both nostrils 4 (four) times daily as needed for rhinitis (allergies).    [provider]  montelukast (SINGULAIR) 10 MG tablet Take 10 mg by mouth at bedtime.  12/06/18   [provider]  rosuvastatin (CRESTOR) 10 MG tablet Take 10 mg by mouth every evening.  11/19/18   [provider]  traZODone (DESYREL) 50 MG  tablet Take 50 mg by mouth at bedtime. 01/20/19   [provider]    Blood pressure 136/77, pulse (!) 57, temperature (!) 97.5 F (36.4 C), temperature source Oral, resp. rate 18, height 5\' 8"  (1.727 m), weight 68 kg, SpO2 98 %.  Physical Exam  Constitutional: He is oriented to person, place, and time. He has a sickly appearance.  HENT:  Head: Normocephalic and atraumatic.  Right Ear: External ear normal.  Left Ear: External ear normal.  Nose: Nose normal.  Mouth/Throat: Oropharynx is clear and moist. Abnormal dentition.  Eyes: Conjunctivae, EOM and lids are normal. No scleral icterus.  Neck: Phonation normal. Neck supple.  Cardiovascular: Regular rhythm. Tachycardia present.  Pulses:      Radial pulses are 2+ on the right side and 2+ on the left side.       Dorsalis pedis pulses are 2+ on the right side and 2+ on the left side.  Pulmonary/Chest: Effort normal. Tachypnea noted.  Abdominal: He exhibits distension. Bowel sounds are hyperactive. There is generalized abdominal tenderness. There is rebound and guarding.  Musculoskeletal:     Comments: ROM grossly intact in all 4 extremities  Neurological: He is alert and oriented to person, place, and time.  Skin: Skin is warm. He is diaphoretic.    No results found for this or any previous visit (from the past 48 hour(s)). No results found.    Assessment/Plan Sigmoid perforation - s/p colonoscopy today, Dr. Earlean Shawl - patient is peritonitic and tachycardic - to OR for exploratory laparotomy, probable colostomy    Plan - Abx, type and screen, rapid COVID pending. To OR ASAP.   Brigid Re , [facility] Surgery 10/06/2019, 10:57 AM Please see Amion for pager number during day hours 7:00am-4:30pm

## 2019-10-06 NOTE — Op Note (Addendum)
Operative Note  Randy Merritt  [id number]  [id number]  10/06/2019   Surgeon: Victorino Sparrow ConnorMD FACS  Assistant: Reather Laurence, MD and Jackson Latino PA-C  Procedure performed: exploratory laparotomy, segmental sigmoid resection with colorectal anastamosis, appendectomy  Procedure status: EMERGENT  Preop diagnosis: sigmoid perforation sustained during colonoscopy Post-op diagnosis/intraop findings: diffuse peritonitis, large perforation at rectosigmoid junction, fusion of distal sigmoid to mesentery of appendix and terminal ileum  Specimens: sigmoid segmental resection, appendix Retained items: no EBL: [id number] Complications: none  Description of procedure: After obtaining informed consent the patient was taken to the operating room and placed supine on operating room table wheregeneral endotracheal anesthesia was initiated, preoperative antibiotics were administered, SCDs applied, and a formal timeout was performed.  Foley catheter was inserted.  The abdomen was prepped and draped in the usual sterile fashion and a midline laparotomy was created.  Large rush of air was encountered on entering the peritoneal cavity.  The small bowel was diffusely erythematous and injected consistent with diffuse peritonitis.  There was no gross contamination.  The small bowel was inspected and free of injury.  The ascending, transverse and descending colon were inspected without evident injury however there was a very large, approximately 5 cm disruption of the antimesenteric aspect of the sigmoid colon just proximal to the rectosigmoid junction which was somewhat deep in his narrow pelvis.  The sigmoid just proximal to this was essentially fused to the mesentery of the appendix and terminal ileum creating an acute turn in the bowel.  These adhesions were carefully divided and the sigmoid colon was mobilized by the dissecting the white line of Toldt and entering the retromesenteric plane.  The ureter was  identified and well away from our dissection.  A window was made in the mesorectum a few centimeters distal to the perforation and the contour green load stapler was used to divide the rectum.  A window was then made in the sigmoid mesentery several centimeters proximal to the perforation and the stapler was again used to divide the colon.  The intervening mesentery was divided close to the colon wall using the LigaSure.  We then turned to the appendix, its mesentery was divided with LigaSure and cautery and then the appendix was transected from the cecum with a blue load linear cutting stapler and handed off for pathology.  Hemostasis was ensured within the abdomen.  The staple line on the distal sigmoid was excised and the anvil of the 29 mm EEA was inserted and the anvil brought out on the antimesenteric border of the sigmoid colon and secured with a pursestring in order to create a Baker type anastomosis.  The end of the colon was stapled with a blue load linear cutting stapler.  I then went below and introduced the 29 mm EEA stapler through the rectum and deployed the spike with assistance from Dr. Bobbye Morton, who connected the anvil to the spike.  The anastomosis was completed and the stapler removed.  The donuts were intact.  A leak test was performed by occluding the sigmoid colon inflating the pelvis with saline while infiltrating the rectum and sigmoid with air.  No bubbles were observed.  The anterior aspect of the staple line was reinforced with interrupted seromuscular 3-0 Vicryls.  The abdomen was then irrigated with several liters of warm sterile saline.  The small bowel was returned to the anatomic position and the omentum was brought back down to cover the bowel.  At this point all gowns, gloves and instruments were  changed in accordance with the eras protocol.  The midline was closed with running looped #1 PDS starting at either end and tying centrally.  The soft tissues were irrigated and the skin was  closed loosely with staples.  A honeycomb dressing was applied. The patient was then awakened, extubated and taken to PACU in stable condition.   All counts were correct at the completion of the case.

## 2019-10-06 NOTE — Anesthesia Postprocedure Evaluation (Signed)
Anesthesia Post Note  Patient: Randy Merritt  Procedure(s) Performed: EXPLORATORY LAPAROTOMY (N/A Abdomen) COLON RESECTION SIGMOID (N/A Abdomen) APPENDECTOMY (N/A Abdomen)     Patient location during evaluation: PACU Anesthesia Type: General Level of consciousness: awake and alert Pain management: pain level controlled Vital Signs Assessment: post-procedure vital signs reviewed and stable Respiratory status: spontaneous breathing, nonlabored ventilation, respiratory function stable and patient connected to nasal cannula oxygen Cardiovascular status: blood pressure returned to baseline and stable Postop Assessment: no apparent nausea or vomiting Anesthetic complications: no    Last Vitals:  Vitals:   10/06/19 1630 10/06/19 1631  BP:  137/79  Pulse: 78 79  Resp: 11 16  Temp:    SpO2: 94% 93%    Last Pain:  Vitals:   10/06/19 1602  TempSrc:   PainSc: 0-No pain                 Jantz Main

## 2019-10-06 NOTE — ED Triage Notes (Signed)
Pt to ED via EMS from surgical center- pt was there today for colonoscopy- to ED for perforated bowel . Pt currently has Unasyn 1.5gr and LR infusing #20 R wrist. VSS, last VS P 60, RR 28, 100% RA, BP 124/65, 97 temp.

## 2019-10-06 NOTE — Transfer of Care (Signed)
Immediate Anesthesia Transfer of Care Note  Patient: Randy Merritt  Procedure(s) Performed: EXPLORATORY LAPAROTOMY (N/A Abdomen) COLON RESECTION SIGMOID (N/A Abdomen) APPENDECTOMY (N/A Abdomen)  Patient Location: PACU  Anesthesia Type:General  Level of Consciousness: awake and drowsy  Airway & Oxygen Therapy: Patient Spontanous Breathing and Patient connected to nasal cannula oxygen  Post-op Assessment: Report given to RN and Post -op Vital signs reviewed and stable  Post vital signs: Reviewed and stable  Last Vitals:  Vitals Value Taken Time  BP 149/80 10/06/19 1601  Temp    Pulse 90 10/06/19 1605  Resp 19 10/06/19 1605  SpO2 99 % 10/06/19 1605  Vitals shown include unvalidated device data.  Last Pain:  Vitals:   10/06/19 1131  TempSrc:   PainSc: 10-Worst pain ever      Patients Stated Pain Goal: 0 ([id number])  Complications: No apparent anesthesia complications

## 2019-10-06 NOTE — Anesthesia Preprocedure Evaluation (Addendum)
Anesthesia Evaluation  Patient identified by MRN, date of birth, ID band Patient awake    Reviewed: Allergy & Precautions, NPO status , Patient's Chart, lab work & pertinent test results  History of Anesthesia Complications Negative for: history of anesthetic complications  Airway Mallampati: II  TM Distance: >3 FB Neck ROM: Full    Dental  (+) Missing, Dental Advisory Given,    Pulmonary former smoker,    Pulmonary exam normal breath sounds clear to auscultation       Cardiovascular negative cardio ROS Normal cardiovascular exam Rhythm:Regular Rate:Normal     Neuro/Psych PSYCHIATRIC DISORDERS Anxiety Depression negative neurological ROS     GI/Hepatic Neg liver ROS, GERD  Medicated,  Endo/Other  negative endocrine ROS  Renal/GU negative Renal ROS     Musculoskeletal  (+) Arthritis , Osteoarthritis,    Abdominal   Peds  Hematology negative hematology ROS (+)   Anesthesia Other Findings Chest x-ray IMPRESSION: 1. Large volume of pneumoperitoneum compatible with reported clinical history of perforated bowel. 2. Low lung volumes with atelectasis and/or consolidation in the left lower lobe.  Reproductive/Obstetrics                           Anesthesia Physical  Anesthesia Plan  ASA: III and emergent  Anesthesia Plan: General   Post-op Pain Management:    Induction: Rapid sequence, Cricoid pressure planned and Intravenous  PONV Risk Score and Plan: Treatment may vary due to age or medical condition and Propofol infusion  Airway Management Planned: Oral ETT  Additional Equipment: None  Intra-op Plan:   Post-operative Plan: Extubation in OR  Informed Consent: I have reviewed the patients History and Physical, chart, labs and discussed the procedure including the risks, benefits and alternatives for the proposed anesthesia with the patient or authorized representative who has  indicated his/her understanding and acceptance.     Dental advisory given  Plan Discussed with: CRNA, Anesthesiologist and Surgeon  Anesthesia Plan Comments: (  )        Anesthesia Quick Evaluation

## 2019-10-06 NOTE — Anesthesia Procedure Notes (Signed)
Procedure Name: Intubation Date/Time: 10/06/2019 1:42 PM Performed by: Harden Mo, CRNA Pre-anesthesia Checklist: Patient identified, Emergency Drugs available, Suction available and Patient being monitored Patient Re-evaluated:Patient Re-evaluated prior to induction Oxygen Delivery Method: Circle System Utilized Preoxygenation: Pre-oxygenation with 100% oxygen Induction Type: IV induction, Rapid sequence and Cricoid Pressure applied Laryngoscope Size: Glidescope and 4 Grade View: Grade II Tube type: Oral Tube size: 7.5 mm Number of attempts: 1 Airway Equipment and Method: Stylet,  Oral airway and Video-laryngoscopy Placement Confirmation: ETT inserted through vocal cords under direct vision,  positive ETCO2 and breath sounds checked- equal and bilateral Secured at: 23 cm Tube secured with: Tape Dental Injury: Teeth and Oropharynx as per pre-operative assessment

## 2019-10-07 ENCOUNTER — Encounter (HOSPITAL_COMMUNITY): Payer: Self-pay | Admitting: Surgery

## 2019-10-07 ENCOUNTER — Other Ambulatory Visit: Payer: Self-pay

## 2019-10-07 LAB — BASIC METABOLIC PANEL
Anion gap: 12 (ref 5–15)
BUN: 16 mg/dL (ref 8–23)
CO2: 21 mmol/L — ABNORMAL LOW (ref 22–32)
Calcium: 8.1 mg/dL — ABNORMAL LOW (ref 8.9–10.3)
Chloride: 106 mmol/L (ref 98–111)
Creatinine, Ser: 1.49 mg/dL — ABNORMAL HIGH (ref 0.61–1.24)
GFR calc Af Amer: 51 mL/min — ABNORMAL LOW (ref >60)
GFR calc non Af Amer: 44 mL/min — ABNORMAL LOW (ref >60)
Glucose, Bld: 145 mg/dL — ABNORMAL HIGH (ref 70–99)
Potassium: 4.2 mmol/L (ref 3.5–5.1)
Sodium: 139 mmol/L (ref 135–145)

## 2019-10-07 LAB — CBC
HCT: 36.6 % — ABNORMAL LOW (ref 39.0–52.0)
Hemoglobin: 12.5 g/dL — ABNORMAL LOW (ref 13.0–17.0)
MCH: 32.5 pg (ref 26.0–34.0)
MCHC: 34.2 g/dL (ref 30.0–36.0)
MCV: 95.1 fL (ref 80.0–100.0)
Platelets: 129 10*3/uL — ABNORMAL LOW (ref 150–400)
RBC: 3.85 MIL/uL — ABNORMAL LOW (ref 4.22–5.81)
RDW: 12.5 % (ref 11.5–15.5)
WBC: 2.9 10*3/uL — ABNORMAL LOW (ref 4.0–10.5)
nRBC: 0 % (ref 0.0–0.2)

## 2019-10-07 MED ORDER — CHLORHEXIDINE GLUCONATE CLOTH 2 % EX PADS
6.0000 | MEDICATED_PAD | Freq: Every day | CUTANEOUS | Status: DC
  Administered 2019-10-07 – 2019-10-12 (×4): 6 via TOPICAL

## 2019-10-07 NOTE — Progress Notes (Signed)
1 Day Post-Op   Subjective/Chief Complaint: Confused this morning. Pain well controlled.    Objective: Vital signs in last 24 hours: Temp:  [97.4 F (36.3 C)-98.8 F (37.1 C)] 98.8 F (37.1 C) (11/25 0439) Pulse Rate:  [57-102] 102 (11/25 0439) Resp:  [11-35] 18 (11/25 0439) BP: (110-156)/(77-96) 110/78 (11/25 0439) SpO2:  [91 %-100 %] 99 % (11/25 0439) Weight:  [68 kg] 68 kg (11/24 1021) Last BM Date: (PTA)  Intake/Output from previous day: 11/24 0701 - 11/25 0700 In: 1823.9 [I.V.:1710.2; IV Piggyback:113.6] Out: 705 [Urine:505; Blood:200] Intake/Output this shift: No intake/output data recorded.  General appearance: alert and confused, vaguely oriented to situation Resp: unlabored Cardio: regular rate and rhythm GI: soft, distended, appropriately tender. Incision d/d/i some blood staining on honeycomb Skin: Skin color, texture, turgor normal. No rashes or lesions GU- Foley in place, urine concentrated  Lab Results:  Recent Labs    10/06/19 1053 10/07/19 0156  WBC 3.3* 2.9*  HGB 13.8 12.5*  HCT 41.1 36.6*  PLT 157 129*   BMET Recent Labs    10/06/19 1053 10/07/19 0156  NA 137 139  K 4.3 4.2  CL 103 106  CO2 26 21*  GLUCOSE 167* 145*  BUN 15 16  CREATININE 1.69* 1.49*  CALCIUM 9.2 8.1*   PT/INR No results for input(s): LABPROT, INR in the last 72 hours. ABG No results for input(s): PHART, HCO3 in the last 72 hours.  Invalid input(s): PCO2, PO2  Studies/Results: Dg Chest Portable 1 View  Result Date: 10/06/2019 CLINICAL DATA:  78 year old male with history of perforated bowel. Lower abdominal pain. EXAM: PORTABLE CHEST 1 VIEW COMPARISON:  Chest x-ray 01/28/2017. FINDINGS: Large amount of pneumoperitoneum noted beneath the diaphragm. Lung volumes are low. Irregular opacities at the left lung base may reflect atelectasis and/or consolidation. Right lung is clear. No pleural effusions. No evidence of pulmonary edema. No pneumothorax. Heart size is  normal. Upper mediastinal contours are within normal limits. IMPRESSION: 1. Large volume of pneumoperitoneum compatible with reported clinical history of perforated bowel. 2. Low lung volumes with atelectasis and/or consolidation in the left lower lobe. Electronically Signed   By: Randy Merritt M.D.   On: 10/06/2019 10:38    Anti-infectives: Anti-infectives (From admission, onward)   Start     Dose/Rate Route Frequency Ordered Stop   10/06/19 1200  piperacillin-tazobactam (ZOSYN) IVPB 3.375 g     3.375 g 12.5 mL/hr over 240 Minutes Intravenous Every 8 hours 10/06/19 1051        Assessment/Plan: Iatrogenic colon perforation from colonoscopy 11/24 s/p ex lap, segmental sigmoid colectomy with colorectal anastomosis, appendectomy Dr. Kae Heller  Ileus expected. Await return of bowel function Borderline UOP, creatinine downtrending. Increase IVF. Delirium- unclear what baseline is, minimize narcotics and maintain normal sleep wake cycle    LOS: 1 day    Randy Merritt 10/07/2019

## 2019-10-07 NOTE — Plan of Care (Signed)

## 2019-10-08 LAB — CBC
HCT: 29.7 % — ABNORMAL LOW (ref 39.0–52.0)
Hemoglobin: 10.2 g/dL — ABNORMAL LOW (ref 13.0–17.0)
MCH: 32.2 pg (ref 26.0–34.0)
MCHC: 34.3 g/dL (ref 30.0–36.0)
MCV: 93.7 fL (ref 80.0–100.0)
Platelets: 102 10*3/uL — ABNORMAL LOW (ref 150–400)
RBC: 3.17 MIL/uL — ABNORMAL LOW (ref 4.22–5.81)
RDW: 12.8 % (ref 11.5–15.5)
WBC: 3.6 10*3/uL — ABNORMAL LOW (ref 4.0–10.5)
nRBC: 0 % (ref 0.0–0.2)

## 2019-10-08 LAB — BASIC METABOLIC PANEL
Anion gap: 8 (ref 5–15)
BUN: 18 mg/dL (ref 8–23)
CO2: 23 mmol/L (ref 22–32)
Calcium: 7.7 mg/dL — ABNORMAL LOW (ref 8.9–10.3)
Chloride: 108 mmol/L (ref 98–111)
Creatinine, Ser: 1.61 mg/dL — ABNORMAL HIGH (ref 0.61–1.24)
GFR calc Af Amer: 47 mL/min — ABNORMAL LOW (ref >60)
GFR calc non Af Amer: 40 mL/min — ABNORMAL LOW (ref >60)
Glucose, Bld: 91 mg/dL (ref 70–99)
Potassium: 3.7 mmol/L (ref 3.5–5.1)
Sodium: 139 mmol/L (ref 135–145)

## 2019-10-08 LAB — MAGNESIUM: Magnesium: 1.5 mg/dL — ABNORMAL LOW (ref 1.7–2.4)

## 2019-10-08 MED ORDER — MAGNESIUM SULFATE 4 GM/100ML IV SOLN
4.0000 g | Freq: Once | INTRAVENOUS | Status: AC
  Administered 2019-10-08: 10:00:00 4 g via INTRAVENOUS
  Filled 2019-10-08: qty 100

## 2019-10-08 NOTE — Progress Notes (Signed)
2 Days Post-Op   Subjective/Chief Complaint: Denies nausea or vomiting.  No real flatus yet.  No significant belching.     Objective: Vital signs in last 24 hours: Temp:  [98 F (36.7 C)-99.7 F (37.6 C)] 98 F (36.7 C) (11/26 0820) Pulse Rate:  [92-102] 92 (11/26 0820) Resp:  [18-24] 20 (11/26 0820) BP: (100-118)/(57-75) 109/75 (11/26 0820) SpO2:  [92 %-96 %] 94 % (11/26 0820) Last BM Date: 10/05/19  Intake/Output from previous day: 11/25 0701 - 11/26 0700 In: 1394.4 [P.O.:150; I.V.:1244.4] Out: 1075 [Urine:1075] Intake/Output this shift: No intake/output data recorded.  General appearance: alert.  No acute distress.   Resp: unlabored Cardio: regular rate and rhythm GI: soft, distended, appropriately tender.  Minimal staining on dressing.   Skin: Skin color, texture, turgor normal. No rashes or lesions GU- Foley in place, urine concentrated  Lab Results:  Recent Labs    10/07/19 0156 10/08/19 0237  WBC 2.9* 3.6*  HGB 12.5* 10.2*  HCT 36.6* 29.7*  PLT 129* 102*   BMET Recent Labs    10/07/19 0156 10/08/19 0237  NA 139 139  K 4.2 3.7  CL 106 108  CO2 21* 23  GLUCOSE 145* 91  BUN 16 18  CREATININE 1.49* 1.61*  CALCIUM 8.1* 7.7*   PT/INR No results for input(s): LABPROT, INR in the last 72 hours. ABG No results for input(s): PHART, HCO3 in the last 72 hours.  Invalid input(s): PCO2, PO2  Studies/Results: No results found.  Anti-infectives: Anti-infectives (From admission, onward)   Start     Dose/Rate Route Frequency Ordered Stop   10/06/19 1200  piperacillin-tazobactam (ZOSYN) IVPB 3.375 g     3.375 g 12.5 mL/hr over 240 Minutes Intravenous Every 8 hours 10/06/19 1051        Assessment/Plan: Iatrogenic colon perforation from colonoscopy 11/24 [facility]) s/p ex lap, segmental sigmoid colectomy with colorectal anastomosis, appendectomy Dr. Kae Heller  Ileus expected. Await return of bowel function.  Will let have clears today.   UOP improved.  Cr  stable.    Delirium improved.     LOS: 2 days    Stark Klein 10/08/2019

## 2019-10-09 LAB — SURGICAL PATHOLOGY

## 2019-10-09 NOTE — Progress Notes (Signed)
3 Days Post-Op   Subjective/Chief Complaint: No n/v.  Tolerated small amounts of clears.  Still no flatus or BM.  Urinating after foley removal.    Objective: Vital signs in last 24 hours: Temp:  [98.4 F (36.9 C)-99.5 F (37.5 C)] 98.4 F (36.9 C) (11/27 0401) Pulse Rate:  [84-99] 95 (11/27 0401) Resp:  [17-20] 20 (11/27 0401) BP: (113-137)/(54-79) 137/79 (11/27 0401) SpO2:  [92 %-97 %] 92 % (11/27 0401) Last BM Date: 10/05/19  Intake/Output from previous day: 11/26 0701 - 11/27 0700 In: 4313.8 [P.O.:700; I.V.:3236.3; IV Piggyback:377.6] Out: 1360 [Urine:1360] Intake/Output this shift: No intake/output data recorded.  General appearance: sleeping, arousable.    Resp: unlabored GI: soft, distended, appropriately tender.  Minimal staining on dressing.   Skin: Skin color, texture, turgor normal. No rashes or lesions   Lab Results:  Recent Labs    10/07/19 0156 10/08/19 0237  WBC 2.9* 3.6*  HGB 12.5* 10.2*  HCT 36.6* 29.7*  PLT 129* 102*   BMET Recent Labs    10/07/19 0156 10/08/19 0237  NA 139 139  K 4.2 3.7  CL 106 108  CO2 21* 23  GLUCOSE 145* 91  BUN 16 18  CREATININE 1.49* 1.61*  CALCIUM 8.1* 7.7*   PT/INR No results for input(s): LABPROT, INR in the last 72 hours. ABG No results for input(s): PHART, HCO3 in the last 72 hours.  Invalid input(s): PCO2, PO2  Studies/Results: No results found.  Anti-infectives: Anti-infectives (From admission, onward)   Start     Dose/Rate Route Frequency Ordered Stop   10/06/19 1200  piperacillin-tazobactam (ZOSYN) IVPB 3.375 g     3.375 g 12.5 mL/hr over 240 Minutes Intravenous Every 8 hours 10/06/19 1051        Assessment/Plan: Iatrogenic colon perforation from colonoscopy 11/24 [facility]) s/p ex lap, segmental sigmoid colectomy with colorectal anastomosis, appendectomy Dr. Kae Heller 10/06/2019  Ileus as expected. Stay on clears given distention and lack of flatus. Possible suppository tomorrow if no flatus.   Encouraged ambulation. AKI - recheck Cr tomorrow.  Recheck Mag as well.      LOS: 3 days    Stark Klein 10/09/2019

## 2019-10-10 LAB — BASIC METABOLIC PANEL
Anion gap: 10 (ref 5–15)
BUN: 11 mg/dL (ref 8–23)
CO2: 22 mmol/L (ref 22–32)
Calcium: 8.3 mg/dL — ABNORMAL LOW (ref 8.9–10.3)
Chloride: 111 mmol/L (ref 98–111)
Creatinine, Ser: 1.4 mg/dL — ABNORMAL HIGH (ref 0.61–1.24)
GFR calc Af Amer: 55 mL/min — ABNORMAL LOW (ref >60)
GFR calc non Af Amer: 48 mL/min — ABNORMAL LOW (ref >60)
Glucose, Bld: 84 mg/dL (ref 70–99)
Potassium: 3.4 mmol/L — ABNORMAL LOW (ref 3.5–5.1)
Sodium: 143 mmol/L (ref 135–145)

## 2019-10-10 LAB — PHOSPHORUS: Phosphorus: 1.5 mg/dL — ABNORMAL LOW (ref 2.5–4.6)

## 2019-10-10 LAB — MAGNESIUM: Magnesium: 2 mg/dL (ref 1.7–2.4)

## 2019-10-10 MED ORDER — POTASSIUM PHOSPHATES 15 MMOLE/5ML IV SOLN
30.0000 mmol | Freq: Once | INTRAVENOUS | Status: AC
  Administered 2019-10-10: 30 mmol via INTRAVENOUS
  Filled 2019-10-10: qty 10

## 2019-10-10 MED ORDER — ENOXAPARIN SODIUM 40 MG/0.4ML ~~LOC~~ SOLN
40.0000 mg | SUBCUTANEOUS | Status: DC
  Administered 2019-10-12 – 2019-10-15 (×4): 40 mg via SUBCUTANEOUS
  Filled 2019-10-10 (×4): qty 0.4

## 2019-10-10 NOTE — Progress Notes (Signed)
4 Days Post-Op   Subjective/Chief Complaint: No n/v.  2 stools recorded.  Pt states that there was some blood in it, but flushed both.    Objective: Vital signs in last 24 hours: Temp:  [98 F (36.7 C)-98.9 F (37.2 C)] 98 F (36.7 C) (11/28 0556) Pulse Rate:  [81-88] 88 (11/28 0556) Resp:  [18] 18 (11/28 0556) BP: (138-144)/(76-81) 144/81 (11/28 0556) SpO2:  [99 %] 99 % (11/28 0556) Last BM Date: 10/05/19  Intake/Output from previous day: 11/27 0701 - 11/28 0700 In: 2329 [P.O.:840; I.V.:1290.5; IV Piggyback:198.5] Out: 1350 [Urine:1350] Intake/Output this shift: No intake/output data recorded.  General appearance: awake.  Interactive.  A bit repetitive.      Resp: unlabored GI: soft, less distended, minimally tender. Wound c/d/i after dressing removed.   Skin: Skin color, texture, turgor normal. No rashes or lesions   Lab Results:  Recent Labs    10/08/19 0237  WBC 3.6*  HGB 10.2*  HCT 29.7*  PLT 102*   BMET Recent Labs    10/08/19 0237 10/10/19 0253  NA 139 143  K 3.7 3.4*  CL 108 111  CO2 23 22  GLUCOSE 91 84  BUN 18 11  CREATININE 1.61* 1.40*  CALCIUM 7.7* 8.3*   PT/INR No results for input(s): LABPROT, INR in the last 72 hours. ABG No results for input(s): PHART, HCO3 in the last 72 hours.  Invalid input(s): PCO2, PO2  Studies/Results: No results found.  Anti-infectives: Anti-infectives (From admission, onward)   Start     Dose/Rate Route Frequency Ordered Stop   10/06/19 1200  piperacillin-tazobactam (ZOSYN) IVPB 3.375 g     3.375 g 12.5 mL/hr over 240 Minutes Intravenous Every 8 hours 10/06/19 1051        Assessment/Plan: Iatrogenic colon perforation from colonoscopy 11/24 [facility]) s/p ex lap, segmental sigmoid colectomy with colorectal anastomosis, appendectomy Dr. Kae Heller 10/06/2019  Ileus improving. Question of bloody BM - give drop in HCT, will hold 1 day of lovenox (already got it today).  Recheck tomorrow.   Advance to full  liquids Hypokalemia and hypophosphatemie - replete.   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   Decrease IV fluids.      LOS: 4 days    Stark Klein 10/10/2019

## 2019-10-11 LAB — BASIC METABOLIC PANEL
Anion gap: 13 (ref 5–15)
BUN: 14 mg/dL (ref 8–23)
CO2: 20 mmol/L — ABNORMAL LOW (ref 22–32)
Calcium: 8.6 mg/dL — ABNORMAL LOW (ref 8.9–10.3)
Chloride: 110 mmol/L (ref 98–111)
Creatinine, Ser: 1.27 mg/dL — ABNORMAL HIGH (ref 0.61–1.24)
GFR calc Af Amer: >60 mL/min (ref >60)
GFR calc non Af Amer: 54 mL/min — ABNORMAL LOW (ref >60)
Glucose, Bld: 102 mg/dL — ABNORMAL HIGH (ref 70–99)
Potassium: 3.5 mmol/L (ref 3.5–5.1)
Sodium: 143 mmol/L (ref 135–145)

## 2019-10-11 LAB — CBC
HCT: 28.2 % — ABNORMAL LOW (ref 39.0–52.0)
Hemoglobin: 9.4 g/dL — ABNORMAL LOW (ref 13.0–17.0)
MCH: 31.9 pg (ref 26.0–34.0)
MCHC: 33.3 g/dL (ref 30.0–36.0)
MCV: 95.6 fL (ref 80.0–100.0)
Platelets: 129 10*3/uL — ABNORMAL LOW (ref 150–400)
RBC: 2.95 MIL/uL — ABNORMAL LOW (ref 4.22–5.81)
RDW: 13.7 % (ref 11.5–15.5)
WBC: 3.8 10*3/uL — ABNORMAL LOW (ref 4.0–10.5)
nRBC: 1.6 % — ABNORMAL HIGH (ref 0.0–0.2)

## 2019-10-11 LAB — PHOSPHORUS: Phosphorus: 2.9 mg/dL (ref 2.5–4.6)

## 2019-10-11 NOTE — Progress Notes (Signed)
5 Days Post-Op   Subjective/Chief Complaint: No bloody stools.  Continues to pass gas.     Objective: Vital signs in last 24 hours: Temp:  [97.7 F (36.5 C)-98.4 F (36.9 C)] 98.4 F (36.9 C) (11/29 0501) Pulse Rate:  [60-72] 69 (11/29 0501) Resp:  [16-17] 17 (11/28 2027) BP: (113-152)/(56-86) 152/78 (11/29 0501) SpO2:  [97 %-99 %] 98 % (11/29 0501) Last BM Date: 10/11/19  Intake/Output from previous day: 11/28 0701 - 11/29 0700 In: 1390 [P.O.:90; I.V.:750; IV Piggyback:550] Out: 201 [Urine:200; Stool:1] Intake/Output this shift: Total I/O In: 240 [P.O.:240] Out: 1 [Stool:1]  General appearance: awake.  Interactive.  Doing IS, around 600 mL Resp: unlabored GI: soft, less distended, minimally tender. Wound c/d/i  Skin: Skin color, texture, turgor normal. No rashes or lesions   Lab Results:  Recent Labs    10/11/19 0115  WBC 3.8*  HGB 9.4*  HCT 28.2*  PLT 129*   BMET Recent Labs    10/10/19 0253 10/11/19 0115  NA 143 143  K 3.4* 3.5  CL 111 110  CO2 22 20*  GLUCOSE 84 102*  BUN 11 14  CREATININE 1.40* 1.27*  CALCIUM 8.3* 8.6*   PT/INR No results for input(s): LABPROT, INR in the last 72 hours. ABG No results for input(s): PHART, HCO3 in the last 72 hours.  Invalid input(s): PCO2, PO2  Studies/Results: No results found.  Anti-infectives: Anti-infectives (From admission, onward)   Start     Dose/Rate Route Frequency Ordered Stop   10/06/19 1200  piperacillin-tazobactam (ZOSYN) IVPB 3.375 g     3.375 g 12.5 mL/hr over 240 Minutes Intravenous Every 8 hours 10/06/19 1051        Assessment/Plan: Iatrogenic colon perforation from colonoscopy 11/24 [facility]) s/p ex lap, segmental sigmoid colectomy with colorectal anastomosis, appendectomy Randy Merritt 10/06/2019  Ileus improving. ABL anemia - stable.     Advance to full liquids Hypokalemia and hypophosphatemia - repleted.  Better.   AKI -creatinine continues to improve.    KVO IVF. Possibly home  tomorrow.     LOS: 5 days    Randy Merritt 10/11/2019

## 2019-10-12 MED ORDER — PANTOPRAZOLE SODIUM 40 MG PO TBEC
40.0000 mg | DELAYED_RELEASE_TABLET | Freq: Every day | ORAL | Status: DC
  Administered 2019-10-12 – 2019-10-14 (×3): 40 mg via ORAL
  Filled 2019-10-12 (×3): qty 1

## 2019-10-12 MED ORDER — METHOCARBAMOL 750 MG PO TABS
750.0000 mg | ORAL_TABLET | Freq: Three times a day (TID) | ORAL | Status: DC
  Administered 2019-10-12 – 2019-10-15 (×9): 750 mg via ORAL
  Filled 2019-10-12 (×9): qty 1

## 2019-10-12 MED ORDER — SACCHAROMYCES BOULARDII 250 MG PO CAPS
250.0000 mg | ORAL_CAPSULE | Freq: Two times a day (BID) | ORAL | Status: DC
  Administered 2019-10-12 – 2019-10-15 (×7): 250 mg via ORAL
  Filled 2019-10-12 (×7): qty 1

## 2019-10-12 NOTE — TOC Initial Note (Signed)
Transition of Care [facility]) - Initial/Assessment Note    Patient Details  Name: Randy Merritt MRN: [id number] Date of Birth: April 23, 1941  [facility]) CM/SW Contact:    Marilu Favre, RN Phone Number: 10/12/2019, 2:59 PM  Clinical Narrative:                 Patient from home with wife and 78 year old grandson.   Discussed PT recommendations for HHPT and 3 in1 and walker, provided medicare.gov home health list. Patient will discuss with his wife this evening before deciding on [facility] or DME, will follow up with patient tomorrow.  Expected Discharge Plan: [place]     Patient Goals and CMS Choice Patient states their goals for this hospitalization and ongoing recovery are:: to go home CMS Medicare.gov Compare Post Acute Care list provided to:: Patient Choice offered to / list presented to : Patient  Expected Discharge Plan and Services Expected Discharge Plan: [place]   Discharge Planning Services: CM Consult Post Acute Care Choice: Home Health, Durable Medical Equipment Living arrangements for the past 2 months: Single Family Home                                      Prior Living Arrangements/Services Living arrangements for the past 2 months: Single Family Home Lives with:: Spouse Patient language and need for interpreter reviewed:: Yes Do you feel safe going back to the place where you live?: Yes      Need for Family Participation in Patient Care: Yes (Comment) Care giver support system in place?: Yes (comment)   Criminal Activity/Legal Involvement Pertinent to Current Situation/Hospitalization: No - Comment as needed  Activities of Daily Living Home Assistive Devices/Equipment: Eyeglasses ADL Screening (condition at time of admission) Patient's cognitive ability adequate to safely complete daily activities?: Yes Is the patient deaf or have difficulty hearing?: No Does the patient have difficulty seeing, even  when wearing glasses/contacts?: No Does the patient have difficulty concentrating, remembering, or making decisions?: No Patient able to express need for assistance with ADLs?: Yes Does the patient have difficulty dressing or bathing?: No Independently performs ADLs?: Yes (appropriate for developmental age) Does the patient have difficulty walking or climbing stairs?: Yes Weakness of Legs: Both Weakness of Arms/Hands: None  Permission Sought/Granted   Permission granted to share information with : No              Emotional Assessment Appearance:: Appears stated age Attitude/Demeanor/Rapport: Engaged Affect (typically observed): Accepting Orientation: : Oriented to  Time, Oriented to Situation, Oriented to Place, Oriented to Self Alcohol / Substance Use: Not Applicable Psych Involvement: No (comment)  Admission diagnosis:  Bowel perforation ([place]) [K63.1] Patient Active Problem List   Diagnosis Date Noted  . Perforated sigmoid colon ([place]) 10/06/2019  . Ingrown toenail 10/11/2017  . 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 Oropharyngeal dysphagia 02/02/2017  . Chronic rhinitis 02/01/2017  . Gastroesophageal reflux disease 02/01/2017   PCP:  Shirline Frees, MD Pharmacy:   [facility] [place], [place] - [address] [place] [address] Phone: [phone] Fax: [phone]     Social Determinants of Health (SDOH) Interventions    Readmission Risk Interventions No flowsheet data found.

## 2019-10-12 NOTE — Progress Notes (Signed)
6 Days Post-Op    CC: Abdominal pain  Subjective: Patient feels pretty bad this AM. He has no appetite.,  He is having multiple liquid stools but remains distended and uncomfortable.  He has not been walking, he only moves about 200 on his incentive spirometry.  He is not taking anything for pain.  He lives with his wife but she works and he is going to be primarily by himself when he goes home. His midline abdominal wound has some erythema.  There was a blister that has broken while I was examining the wound this morning.   Objective: Vital signs in last 24 hours: Temp:  [98.9 F (37.2 C)-99.4 F (37.4 C)] 99.2 F (37.3 C) (11/30 0509) Pulse Rate:  [72-80] 80 (11/30 0509) Resp:  [18] 18 (11/30 0509) BP: (133-149)/(62-72) 133/62 (11/30 0509) SpO2:  [98 %-100 %] 98 % (11/30 0509) Last BM Date: 10/11/19 Pain control 11/29: Tylenol 1 g x 2 480 p.o. recorded Voided x3 BM x1 creatinine 1.69  >> 1.27 TMax 99.2, blood pressure stable WBC 3.8 H/H 9.4/28.2 Platelets 129,000   Intake/Output from previous day: 11/29 0701 - 11/30 0700 In: 480 [P.O.:480] Out: 1 [Stool:1] Intake/Output this shift: No intake/output data recorded.  General appearance: alert, cooperative, no distress and still feels bad Resp: poor inspiratory effort, just moving 250 on IS,  GI:  He is still very distended, BS are hypoactive, he is having multiple loose stools, Midline incision midportion has a broken blister, some erythema.    Lab Results:  Recent Labs    10/11/19 0115  WBC 3.8*  HGB 9.4*  HCT 28.2*  PLT 129*    BMET Recent Labs    10/10/19 0253 10/11/19 0115  NA 143 143  K 3.4* 3.5  CL 111 110  CO2 22 20*  GLUCOSE 84 102*  BUN 11 14  CREATININE 1.40* 1.27*  CALCIUM 8.3* 8.6*   PT/INR No results for input(s): LABPROT, INR in the last 72 hours.  Recent Labs  Lab 10/06/19 1053  AST 26  ALT 27  ALKPHOS 93  BILITOT 1.2  PROT 6.9  ALBUMIN 4.1     Lipase  No results found  for: LIPASE   Medications: . acetaminophen  1,000 mg Oral Q6H  . aspirin EC  81 mg Oral Daily  . Chlorhexidine Gluconate Cloth  6 each Topical Daily  . enoxaparin (LOVENOX) injection  40 mg Subcutaneous Q24H  . finasteride  5 mg Oral QHS  . loratadine  10 mg Oral Daily  . montelukast  10 mg Oral QHS  . pantoprazole (PROTONIX) IV  40 mg Intravenous QHS  . rosuvastatin  10 mg Oral QPM  . traZODone  50 mg Oral QHS   . sodium chloride 10 mL/hr at 10/11/19 0959  . piperacillin-tazobactam (ZOSYN)  IV 3.375 g (10/12/19 0500)   Assessment/Plan Anemia AKI Hypokalemia/hypophosphatemia -replaced Anemia Thrombocytopenia Leukopenia  Iatrogenic colon perforation from colonoscopy 10/06/2019 Dr. Murvin Natal Exploratory laparotomy, segmental sigmoid colectomy with colorectal anastomosis, appendectomy, Dr. Jens Som, 10/06/2019  -Postop ileus  FEN: Soft diet/IV fluids at [facility] ID: Zosyn 11/24 >> day 6 DVT: Lovenox Follow-up: Dr. Windle Guard   Plan:  He still has an ileus and ? C diff.  He won't meet criteria for testing, but I worry about that.   I will put him on a probiotic, I ask the nursing staff to get him something for pain.  Continue Abx, but we may be able to stop those.  Watch the wound.  I will get PT to see and work on mobilizing him he has not been up except for once over the weekend.  They need to give him more for pain also.  I added some Robaxin today also.      LOS: 6 days    Sadiel Mota 10/12/2019 Please see Amion

## 2019-10-12 NOTE — Evaluation (Signed)
Physical Therapy Evaluation Patient Details Name: Drae Mitzel MRN: [id number] DOB: 11/09/1941 Today's Date: 10/12/2019   History of Present Illness  78yo male referred to [facility] by surgical center due to findings of perforated sigmoid colon noted during colonscopy procedure. He received exploratory laparotomy, sigmoid colon resection, and appendectomy on 10/06/19. PMH anxiety, HLD, ACDF, cervical disc surgery  Clinical Impression   Patient received in bed, very pleasant and exclaims  "I've been waiting for someone to walk me!"; very tangential so most information regarding PLOF/home set up was taken from prior charting today. Max cues/education on log rolling to reduce pain due to abdominal incision, patient ignores all cues and sat himself right up at EOB but did need MinA for balance and steadying due to pain. Then able to perform functional transfers with S/RW and gait trained approximately 254f with RW/S today with VC for safety using device, gait limited by pain. He was left up in the chair with RN aware of patient status, all needs otherwise met today. Currently recommending skilled HHPT services moving forward.     Follow Up Recommendations Home health PT;Supervision for mobility/OOB    Equipment Recommendations  3in1 (PT);Rolling walker with 5" wheels    Recommendations for Other Services       Precautions / Restrictions Precautions Precautions: Fall;Other (comment) Precaution Comments: abdominal wound/incision Restrictions Weight Bearing Restrictions: No      Mobility  Bed Mobility Overal bed mobility: Needs Assistance Bed Mobility: Supine to Sit     Supine to sit: Min assist     General bed mobility comments: Max cues for rolling/sidelying to sit, patient ignores all cues/facilitation techniques and sat up at EOB his way with MinA for balance  Transfers Overall transfer level: Needs assistance Equipment used: Rolling walker (2 wheeled) Transfers: Sit to/from  Stand Sit to Stand: Supervision         General transfer comment: S for safety/VC for hand placement  Ambulation/Gait Ambulation/Gait assistance: Supervision Gait Distance (Feet): 250 Feet Assistive device: Rolling walker (2 wheeled) Gait Pattern/deviations: Step-through pattern;Decreased step length - right;Decreased step length - left;Decreased dorsiflexion - right;Decreased dorsiflexion - left;Decreased stride length;Trunk flexed Gait velocity: decreased   General Gait Details: slow and steady with RW, Mod VC for hand placement as he kept taking hands off of RW to point at things in the hallway; gait distance limited by pain  Stairs            Wheelchair Mobility    Modified Rankin (Stroke Patients Only)       Balance Overall balance assessment: Mild deficits observed, not formally tested                                           Pertinent Vitals/Pain Pain Assessment: Faces Faces Pain Scale: Hurts even more Pain Location: abdomen with mobility Pain Descriptors / Indicators: Stabbing;Grimacing;Operative site guarding Pain Intervention(s): Limited activity within patient's tolerance;Monitored during session;Repositioned    Home Living Family/patient expects to be discharged to:: Private residence Living Arrangements: Spouse/significant other Available Help at Discharge: Family;Available PRN/intermittently Type of Home: House Home Access: Stairs to enter   [employer] Steps: 3 Home Layout: One level Home Equipment: None Additional Comments: patient very tangential and very unclear regarding PLOF/home equipment, some information taken from prior charting    Prior Function Level of Independence: Independent  Hand Dominance   Dominant Hand: Right    Extremity/Trunk Assessment   Upper Extremity Assessment Upper Extremity Assessment: Generalized weakness    Lower Extremity Assessment Lower Extremity  Assessment: Generalized weakness    Cervical / Trunk Assessment Cervical / Trunk Assessment: Normal  Communication   Communication: No difficulties  Cognition Arousal/Alertness: Awake/alert Behavior During Therapy: WFL for tasks assessed/performed Overall Cognitive Status: No family/caregiver present to determine baseline cognitive functioning Area of Impairment: Memory;Safety/judgement;Problem solving                     Memory: Decreased short-term memory   Safety/Judgement: Decreased awareness of safety   Problem Solving: Requires verbal cues;Difficulty sequencing General Comments: some STM deficits noted, patient does seem to have some difficulty with novel tasks      General Comments      Exercises     Assessment/Plan    PT Assessment Patient needs continued PT services  PT Problem List Decreased strength;Decreased cognition;Decreased knowledge of use of DME;Decreased activity tolerance;Decreased safety awareness;Decreased balance;Decreased knowledge of precautions;Decreased mobility;Decreased coordination;Pain       PT Treatment Interventions DME instruction;Balance training;Gait training;Neuromuscular re-education;Stair training;Functional mobility training;Patient/family education;Therapeutic activities;Therapeutic exercise    PT Goals (Current goals can be found in the Care Plan section)  Acute Rehab PT Goals Patient Stated Goal: less stomach pain PT Goal Formulation: With patient Time For Goal Achievement: 10/26/19 Potential to Achieve Goals: Good    Frequency Min 4X/week   Barriers to discharge        Co-evaluation               AM-PAC PT "6 Clicks" Mobility  Outcome Measure Help needed turning from your back to your side while in a flat bed without using bedrails?: A Lot Help needed moving from lying on your back to sitting on the side of a flat bed without using bedrails?: A Lot Help needed moving to and from a bed to a chair  (including a wheelchair)?: A Little Help needed standing up from a chair using your arms (e.g., wheelchair or bedside chair)?: A Little Help needed to walk in hospital room?: A Little Help needed climbing 3-5 steps with a railing? : A Little 6 Click Score: 16    End of Session   Activity Tolerance: Patient tolerated treatment well Patient left: in chair;with call bell/phone within reach Nurse Communication: Mobility status PT Visit Diagnosis: Difficulty in walking, not elsewhere classified (R26.2);Muscle weakness (generalized) (M62.81);Pain Pain - Right/Left: (abdomen) Pain - part of body: (abdomen)    Time: 4481-8563 PT Time Calculation (min) (ACUTE ONLY): 23 min   Charges:   PT Evaluation $PT Eval Low Complexity: 1 Low PT Treatments $Gait Training: 8-22 mins        Windell Norfolk, DPT, PN1   Supplemental Physical Therapist [place]    Pager [phone] Acute Rehab Office [phone]

## 2019-10-13 ENCOUNTER — Inpatient Hospital Stay (HOSPITAL_COMMUNITY): Payer: 59

## 2019-10-13 LAB — BASIC METABOLIC PANEL
Anion gap: 12 (ref 5–15)
BUN: 13 mg/dL (ref 8–23)
CO2: 23 mmol/L (ref 22–32)
Calcium: 9.1 mg/dL (ref 8.9–10.3)
Chloride: 104 mmol/L (ref 98–111)
Creatinine, Ser: 1.26 mg/dL — ABNORMAL HIGH (ref 0.61–1.24)
GFR calc Af Amer: >60 mL/min (ref >60)
GFR calc non Af Amer: 54 mL/min — ABNORMAL LOW (ref >60)
Glucose, Bld: 89 mg/dL (ref 70–99)
Potassium: 3.6 mmol/L (ref 3.5–5.1)
Sodium: 139 mmol/L (ref 135–145)

## 2019-10-13 LAB — MAGNESIUM: Magnesium: 2 mg/dL (ref 1.7–2.4)

## 2019-10-13 MED ORDER — POTASSIUM CHLORIDE CRYS ER 20 MEQ PO TBCR
20.0000 meq | EXTENDED_RELEASE_TABLET | Freq: Two times a day (BID) | ORAL | Status: DC
  Administered 2019-10-13 – 2019-10-15 (×5): 20 meq via ORAL
  Filled 2019-10-13 (×5): qty 1

## 2019-10-13 MED ORDER — KCL IN DEXTROSE-NACL 20-5-0.9 MEQ/L-%-% IV SOLN
INTRAVENOUS | Status: DC
  Administered 2019-10-13 (×2): via INTRAVENOUS
  Filled 2019-10-13 (×2): qty 1000

## 2019-10-13 NOTE — TOC Progression Note (Signed)
Transition of Care [facility]) - Progression Note    Patient Details  Name: Randy Merritt MRN: [id number] Date of Birth: 1941/06/22  [facility] CM/SW Contact  Jacalyn Lefevre Edson Snowball, RN Phone Number: 10/13/2019, 3:03 PM  Clinical Narrative:     Spoke to patient again today . He will speak to wife this evening. NCM will call patient's wife tomorrow. Patient does not want NCM to call today .   Expected Discharge Plan: [place]    Expected Discharge Plan and Services Expected Discharge Plan: [place]   Discharge Planning Services: CM Consult Post Acute Care Choice: Home Health, Durable Medical Equipment Living arrangements for the past 2 months: Single Family Home                                       Social Determinants of Health (SDOH) Interventions    Readmission Risk Interventions No flowsheet data found.

## 2019-10-13 NOTE — Progress Notes (Addendum)
7 Days Post-Op    CC: Colon perforation  Subjective: Patient is still distended.  Bowel movements are perhaps a little thicker today.  His p.o. intake is marginal, he says he goes through them quickly.  He is not hungry.  He thinks he is going to vomit after he needs he has to emesis bags in his bed.  He is picking at some of the food, but not eating very much.  He is up walking now.  He is using a walker.  PT is recommended home health PT and a rolling walker.  He is moving up to 1200 on his I-S this morning with some encouragement.  Objective: Vital signs in last 24 hours: Temp:  [97.8 F (36.6 C)-100.1 F (37.8 C)] 97.8 F (36.6 C) (12/01 0612) Pulse Rate:  [78-83] 83 (12/01 0612) Resp:  [18] 18 (12/01 0612) BP: (130-140)/(67-101) 140/101 (12/01 0612) SpO2:  [98 %-100 %] 100 % (12/01 0612) Last BM Date: 10/13/19 360 p.o. Voided times 6 BM x2 T-max 100.1 single elevated blood pressure recorded this a.m. 140/101 No labs this a.m. Intake/Output from previous day: 11/30 0701 - 12/01 0700 In: 360 [P.O.:360] Out: -  Intake/Output this shift: No intake/output data recorded.  General appearance: alert, cooperative and no distress Resp: clear to auscultation bilaterally GI: He still distended, positive bowel sounds and positive stools. watching the midline incision closely.    Lab Results:  Recent Labs    10/11/19 0115  WBC 3.8*  HGB 9.4*  HCT 28.2*  PLT 129*    BMET Recent Labs    10/11/19 0115  NA 143  K 3.5  CL 110  CO2 20*  GLUCOSE 102*  BUN 14  CREATININE 1.27*  CALCIUM 8.6*   PT/INR No results for input(s): LABPROT, INR in the last 72 hours.  Recent Labs  Lab 10/06/19 1053  AST 26  ALT 27  ALKPHOS 93  BILITOT 1.2  PROT 6.9  ALBUMIN 4.1     Lipase  No results found for: LIPASE   Medications: . acetaminophen  1,000 mg Oral Q6H  . aspirin EC  81 mg Oral Daily  . Chlorhexidine Gluconate Cloth  6 each Topical Daily  . enoxaparin (LOVENOX)  injection  40 mg Subcutaneous Q24H  . finasteride  5 mg Oral QHS  . loratadine  10 mg Oral Daily  . methocarbamol  750 mg Oral TID  . montelukast  10 mg Oral QHS  . pantoprazole  40 mg Oral QHS  . rosuvastatin  10 mg Oral QPM  . saccharomyces boulardii  250 mg Oral BID  . traZODone  50 mg Oral QHS   . sodium chloride 10 mL/hr at 10/11/19 0959  . piperacillin-tazobactam (ZOSYN)  IV 3.375 g (10/13/19 0500)    Assessment/Plan Anemia AKI Hypokalemia/hypophosphatemia -replaced Anemia Thrombocytopenia Leukopenia  Iatrogenic colon perforation from colonoscopy 10/06/2019 Dr. Murvin Natal Exploratory laparotomy, segmental sigmoid colectomy with colorectal anastomosis, appendectomy, Dr. Jens Som, 10/06/2019; POD#7  -Postop ileus  FEN: Soft diet/IV fluids at [facility] ID: [name] 11/24 >> day 7 DVT: Lovenox Follow-up: Dr. Windle Guard  I can recheck his BMP right now and get a plain abdominal film.  I am putting him back on some IV fluids until I am sure he is taking more p.o.         LOS: 7 days    Randy Merritt 10/13/2019 Please see Amion

## 2019-10-13 NOTE — Progress Notes (Signed)
Physical Therapy Treatment Patient Details Name: Randy Merritt MRN: [id number] DOB: 1941-06-20 Today's Date: 10/13/2019    History of Present Illness 78yo male referred to [facility] by surgical center due to findings of perforated sigmoid colon noted during colonscopy procedure. He received exploratory laparotomy, sigmoid colon resection, and appendectomy on 10/06/19. PMH anxiety, HLD, ACDF, cervical disc surgery    PT Comments    Pt sitting EOB for blood draw upon PT arrival, agreeable to PT session this morning despite complaints of feeling overwhelmed during this hospital stay. The pt was able to demo good mobility and transfers with supervision as well as ambulation with RW and supervision, however, the pt frequently removes hands from RW during ambulation to wave to other staff, and to hold onto his abdomen due to increased pain with ambulation. The pt was educated in stopping ambulation prior to letting go of the walker to improve safety, and the pt was agreeable. The pt c/o itching and pain at incision site as well as interest in getting washed up, both concerns were shared with the pt's RN. The pt will continue to benefit from skilled PT to address functional mobility, stability, and increased independence to return to prior level of function.     Follow Up Recommendations  Home health PT;Supervision for mobility/OOB     Equipment Recommendations  3in1 (PT);Rolling walker with 5" wheels    Recommendations for Other Services       Precautions / Restrictions Precautions Precautions: Fall;Other (comment) Precaution Comments: abdominal wound/incision Restrictions Weight Bearing Restrictions: No    Mobility  Bed Mobility Overal bed mobility: Modified Independent             General bed mobility comments: Pt sitting EOB upon PT arrival, able to reposition in bed and in chair with VCs only  Transfers Overall transfer level: Needs assistance Equipment used: 1 person hand held  assist Transfers: Sit to/from Stand Sit to Stand: Supervision         General transfer comment: S for safety  Ambulation/Gait Ambulation/Gait assistance: Supervision Gait Distance (Feet): 250 Feet Assistive device: Rolling walker (2 wheeled) Gait Pattern/deviations: Step-through pattern;Decreased step length - right;Decreased step length - left;Decreased dorsiflexion - right;Decreased dorsiflexion - left;Decreased stride length;Trunk flexed Gait velocity: 0.4 m/s Gait velocity interpretation: <1.31 ft/sec, indicative of household ambulator General Gait Details: slow and steady with RW, Mod VC for hand placement as he kept taking hands off of RW to hold abdomen due to pain; gait distance limited by pain   Stairs             Wheelchair Mobility    Modified Rankin (Stroke Patients Only)       Balance Overall balance assessment: Mild deficits observed, not formally tested                                          Cognition Arousal/Alertness: Awake/alert Behavior During Therapy: WFL for tasks assessed/performed Overall Cognitive Status: No family/caregiver present to determine baseline cognitive functioning Area of Impairment: Memory;Safety/judgement;Problem solving                     Memory: Decreased short-term memory   Safety/Judgement: Decreased awareness of safety   Problem Solving: Requires verbal cues;Difficulty sequencing General Comments: some STM deficits noted, patient does seem to have some difficulty with novel tasks      Exercises  General Comments        Pertinent Vitals/Pain Pain Assessment: Faces Faces Pain Scale: Hurts whole lot Pain Location: abdomen with mobility Pain Descriptors / Indicators: Stabbing;Grimacing;Operative site guarding;Other (Comment);Sharp(pt would stop ambulating to hodl abdomen intermittantly) Pain Intervention(s): Limited activity within patient's tolerance;Monitored during  session;Repositioned    Home Living                      Prior Function            PT Goals (current goals can now be found in the care plan section) Acute Rehab PT Goals Patient Stated Goal: less stomach pain PT Goal Formulation: With patient Time For Goal Achievement: 10/26/19 Potential to Achieve Goals: Good Progress towards PT goals: Progressing toward goals    Frequency    Min 4X/week      PT Plan Current plan remains appropriate    Co-evaluation              AM-PAC PT "6 Clicks" Mobility   Outcome Measure  Help needed turning from your back to your side while in a flat bed without using bedrails?: A Little Help needed moving from lying on your back to sitting on the side of a flat bed without using bedrails?: A Little Help needed moving to and from a bed to a chair (including a wheelchair)?: A Little Help needed standing up from a chair using your arms (e.g., wheelchair or bedside chair)?: A Little Help needed to walk in hospital room?: A Little Help needed climbing 3-5 steps with a railing? : A Little 6 Click Score: 18    End of Session Equipment Utilized During Treatment: Gait belt Activity Tolerance: Patient tolerated treatment well Patient left: in chair;with call bell/phone within reach;with nursing/sitter in room Nurse Communication: Mobility status(pt reports of pain with ambulation, possible need to evaluate incision) PT Visit Diagnosis: Difficulty in walking, not elsewhere classified (R26.2);Muscle weakness (generalized) (M62.81);Pain Pain - Right/Left: (abdomen) Pain - part of body: (abdomen)     Time: [id number] PT Time Calculation (min) (ACUTE ONLY): 29 min  Charges:  $Gait Training: 23-37 mins                     Randy Merritt, PT, DPT   Acute Rehabilitation Department [phone]   Randy Merritt 10/13/2019, 10:40 AM

## 2019-10-14 NOTE — Progress Notes (Signed)
Physical Therapy Treatment Patient Details Name: Randy Merritt MRN: [id number] DOB: [birth year] Today's Date: 10/14/2019    History of Present Illness 78yo male referred to [facility] by surgical center due to findings of perforated sigmoid colon noted during colonscopy procedure. He received exploratory laparotomy, sigmoid colon resection, and appendectomy on 10/06/19. PMH anxiety, HLD, ACDF, cervical disc surgery    PT Comments    Pt OOB standing with RN present upon PT arrival, agreeable to limited session due to fatigue from ambulation earlier with RN. The pt was able to demo good transfers and stability with ambulation today. He used the IV pole instead of the RW to stabilize and was able to hold his stomach with one hand the entire time rather than continually removing one hand from the RW. The pt was able to complete multiple LE exercises standing at the counter with BUE support, and demonstrates good technique. The pt was educated in benefit of LE exercises as well as elevation for trace LE swelling he currently is experiencing, pt is agreeable but verbalizes he is not sure he can remember everything. Recommend continued therapy as well as written HEP and instructions for maximal adherence.     Follow Up Recommendations  Home health PT;Supervision for mobility/OOB     Equipment Recommendations  3in1 (PT);Rolling walker with 5" wheels    Recommendations for Other Services       Precautions / Restrictions Precautions Precautions: Fall;Other (comment) Precaution Comments: abdominal wound/incision Restrictions Weight Bearing Restrictions: No    Mobility  Bed Mobility               General bed mobility comments: pt standing with RN upon arrival to room  Transfers Overall transfer level: Needs assistance Equipment used: None Transfers: Sit to/from Stand Sit to Stand: Supervision         General transfer comment: S for safety  Ambulation/Gait Ambulation/Gait assistance:  Supervision Gait Distance (Feet): 150 Feet Assistive device: IV Pole Gait Pattern/deviations: Step-through pattern;Decreased step length - right;Decreased step length - left;Decreased dorsiflexion - right;Decreased dorsiflexion - left;Decreased stride length;Trunk flexed Gait velocity: 0.4 m/s Gait velocity interpretation: <1.31 ft/sec, indicative of household ambulator General Gait Details: slow and steady with use of IV pole,pt reports he walked with RN earlier and is still fatigued from that   [employer] Mobility    Modified Rankin (Stroke Patients Only)       Balance Overall balance assessment: Mild deficits observed, not formally tested                                          Cognition Arousal/Alertness: Awake/alert Behavior During Therapy: WFL for tasks assessed/performed Overall Cognitive Status: No family/caregiver present to determine baseline cognitive functioning Area of Impairment: Memory;Safety/judgement;Problem solving                     Memory: Decreased short-term memory   Safety/Judgement: Decreased awareness of safety   Problem Solving: Requires verbal cues;Difficulty sequencing General Comments: some STM deficits noted, patient does seem to have some difficulty with novel tasks      Exercises General Exercises - Lower Extremity Heel Raises: Standing;10 reps;Both;Strengthening Mini-Sqauts: Strengthening;Both;10 reps;Standing Other Exercises Other Exercises: Standing knee flexion 2 x 10 bilateral    General Comments        Pertinent Vitals/Pain Pain  Assessment: Faces Faces Pain Scale: Hurts even more Pain Location: abdomen with mobility Pain Descriptors / Indicators: Stabbing;Grimacing;Operative site guarding;Other (Comment);Sharp(pt would stop ambulating to hold abdomen intermittantly) Pain Intervention(s): Limited activity within patient's tolerance;Monitored during session;Repositioned     Home Living                      Prior Function            PT Goals (current goals can now be found in the care plan section) Acute Rehab PT Goals Patient Stated Goal: less stomach pain PT Goal Formulation: With patient Time For Goal Achievement: 10/26/19 Potential to Achieve Goals: Good Progress towards PT goals: Progressing toward goals    Frequency    Min 4X/week      PT Plan Current plan remains appropriate    Co-evaluation              AM-PAC PT "6 Clicks" Mobility   Outcome Measure  Help needed turning from your back to your side while in a flat bed without using bedrails?: A Little Help needed moving from lying on your back to sitting on the side of a flat bed without using bedrails?: A Little Help needed moving to and from a bed to a chair (including a wheelchair)?: A Little Help needed standing up from a chair using your arms (e.g., wheelchair or bedside chair)?: A Little Help needed to walk in hospital room?: A Little Help needed climbing 3-5 steps with a railing? : A Little 6 Click Score: 18    End of Session Equipment Utilized During Treatment: Gait belt Activity Tolerance: Patient tolerated treatment well Patient left: in chair;with call bell/phone within reach Nurse Communication: Mobility status PT Visit Diagnosis: Difficulty in walking, not elsewhere classified (R26.2);Muscle weakness (generalized) (M62.81);Pain Pain - Right/Left: (abdomen) Pain - part of body: (abdomen)     Time: [id number] PT Time Calculation (min) (ACUTE ONLY): 24 min  Charges:  $Gait Training: 8-22 mins $Therapeutic Exercise: 8-22 mins                     Mickey Farber, PT, DPT   Acute Rehabilitation Department [phone]   Otho Bellows 10/14/2019, 2:58 PM

## 2019-10-14 NOTE — Plan of Care (Signed)
  Problem: Education: Goal: Knowledge of General Education information will improve Description Including pain rating scale, medication(s)/side effects and non-pharmacologic comfort measures Outcome: Progressing   

## 2019-10-14 NOTE — Progress Notes (Addendum)
8 Days Post-Op    CC: Abdominal pain  Subjective: Patient slightly better this a.m.  Still not much of an appetite.  He is resistant to long walks but will take short walks.  He is working on his incentive spirometry.  His abdomen is a little less distended.  I took out every other staple on his abdominal wound and it is stable.  Objective: Vital signs in last 24 hours: Temp:  [98.1 F (36.7 C)-98.9 F (37.2 C)] 98.1 F (36.7 C) (12/02 0359) Pulse Rate:  [66-75] 75 (12/02 0359) Resp:  [18] 18 (12/02 0359) BP: (119-159)/(68-77) 128/77 (12/02 0359) SpO2:  [97 %-100 %] 100 % (12/02 0359) Last BM Date: 10/13/19  PO recorded 277 IV 2800  Urine x 2 Stool x 1 Afebrile, VSS   Intake/Output from previous day: 12/01 0701 - 12/02 0700 In: 3126.2 [P.O.:277; I.V.:2230.2; IV Piggyback:619] Out: -  Intake/Output this shift: No intake/output data recorded.  General appearance: alert, cooperative and no distress Resp: clear to auscultation bilaterally GI: Still moderately distended, positive bowel sounds he will he had one BM and thinks he is constipated this a.m. since he did not have a bowel movement with voiding.  Lab Results:  No results for input(s): WBC, HGB, HCT, PLT in the last 72 hours.  BMET Recent Labs    10/13/19 0945  NA 139  K 3.6  CL 104  CO2 23  GLUCOSE 89  BUN 13  CREATININE 1.26*  CALCIUM 9.1   PT/INR No results for input(s): LABPROT, INR in the last 72 hours.  No results for input(s): AST, ALT, ALKPHOS, BILITOT, PROT, ALBUMIN in the last 168 hours.   Lipase  No results found for: LIPASE   Medications: . acetaminophen  1,000 mg Oral Q6H  . aspirin EC  81 mg Oral Daily  . Chlorhexidine Gluconate Cloth  6 each Topical Daily  . enoxaparin (LOVENOX) injection  40 mg Subcutaneous Q24H  . finasteride  5 mg Oral QHS  . loratadine  10 mg Oral Daily  . methocarbamol  750 mg Oral TID  . montelukast  10 mg Oral QHS  . pantoprazole  40 mg Oral QHS  .  potassium chloride  20 mEq Oral BID  . rosuvastatin  10 mg Oral QPM  . saccharomyces boulardii  250 mg Oral BID  . traZODone  50 mg Oral QHS   . sodium chloride 10 mL/hr at 10/11/19 0959  . dextrose 5 % and 0.9 % NaCl with KCl 20 mEq/L 75 mL/hr at 10/13/19 2311  . piperacillin-tazobactam (ZOSYN)  IV 3.375 g (10/14/19 0631)    Assessment/Plan Anemia AKI Hypokalemia/hypophosphatemia-replaced Anemia Thrombocytopenia Leukopenia  Iatrogenic colon perforation from colonoscopy 10/06/2019 Dr. Murvin Natal Exploratory laparotomy, segmental sigmoid colectomy with colorectal anastomosis, appendectomy, Dr. Jens Som, 10/06/2019; POD#8 -Postop ileus  FEN: Soft diet/IV fluids ID: Zosyn 11/24>> day 7 DVT: Lovenox   Plan: I am going to continue conservative treatment today.  I think he is improving.  I told him if he does not have any new issues tomorrow we will discharge him home.  I told him to walk in the halls as much as possible.  He is also doing better with his incentive spirometry.  We removed every other staple from his wound this AM. I am going to DC his Zosyn, saline lock his IV, recheck labs in the AM.   LOS: 8 days    Mikenzie Mccannon 10/14/2019 Please see Amion

## 2019-10-14 NOTE — Discharge Summary (Signed)
Physician Discharge Summary  Patient ID: Randy Merritt MRN: [id number] DOB/AGE: 1941/06/15 79 y.o.  Admit date: 10/06/2019 Discharge date: 10/15/2019  Admission Diagnoses:  Sigmoid perforation S/p colonoscopy 10/06/2019 Dr. Earlean Merritt Hx anxiety/depression Hx hyperlipidemia Hx GERD Seasonal allergies  Discharge Diagnoses:  Sigmoid perforation Post op ileus S/p colonoscopy 10/06/2019 Dr. Earlean Merritt Hx anxiety/depression Hx hyperlipidemia Hx GERD Seasonal allergies  Active Problems:   Perforated sigmoid colon ([place])   PROCEDURES: Exploratory laparotomy, segmental sigmoid resection with colorectal anastomosis, and appendectomy 10/06/2019, Dr. Drucilla Merritt Course:  Randy Merritt is a 78yo male PMH anxiety/depression, HLD, seasonal allergies, and GERD who presented to [facility] today from surgical center where he underwent colonoscopy today. Was having a screening colonoscopy per his report. Denies recent hx of diverticulitis or blood in stools. He was noted to have a bowel perforationinsigmoid colon while pulling camera out at completion of colonoscopy. Patient was started on LR IVF and Unasyn and presented to [facility]. Patient complaining of severe diffuse abdominal pain and SOB. Denies nausea currently. Patient reports prior cholecystectomy. Takes occasional medication for GERD but no anticoagulants. NKDA. Former smoker. Patient lives with his wife, [name]. He reports that he does accept blood products if needed.   Patient was seen in the emergency department and taken to the operating room.  Patient was found to have diffuse peritonitis, a large perforation at the rectosigmoid junction, fusion of the distal sigmoid to the mesentery of the appendix and terminal ileum.  Patient underwent the procedure as described above.  Return to the floor in satisfactory condition.  He had some confusion the first postoperative morning but his pain was well controlled.  He has made slow progress and has  had a postop ileus.  He has been advanced to a soft diet, his p.o. intake has been rather poor, but is ileus is slowly improving.  He had not been walking at least over the weekend, and we had the staff mobilize him more.  He is slowly working on his diet his stools have become more solid and less frequent.  His midline wound had an area that developed a bleb which is drained itself.  Is also had some erythema and we removed every other suture on 10/14/2019.  By the AM of 10/15/19 he was tolerating more of his diet, his midline wound was stable, his labs and film were improving and he was ready for discharge.  We will get the rest of his staple out on 10/19/19.   CBC Latest Ref Rng & Units 10/15/2019 10/11/2019 10/08/2019  WBC 4.0 - 10.5 K/uL 5.0 3.8(L) 3.6(L)  Hemoglobin 13.0 - 17.0 g/dL 8.9(L) 9.4(L) 10.2(L)  Hematocrit 39.0 - 52.0 % 26.8(L) 28.2(L) 29.7(L)  Platelets 150 - 400 K/uL 239 129(L) 102(L)   CMP Latest Ref Rng & Units 10/15/2019 10/13/2019 10/11/2019  Glucose 70 - 99 mg/dL 98 89 102(H)  BUN 8 - 23 mg/dL 6(L) 13 14  Creatinine 0.61 - 1.24 mg/dL 1.09 1.26(H) 1.27(H)  Sodium 135 - 145 mmol/L 138 139 143  Potassium 3.5 - 5.1 mmol/L 3.6 3.6 3.5  Chloride 98 - 111 mmol/L 109 104 110  CO2 22 - 32 mmol/L 23 23 20(L)  Calcium 8.9 - 10.3 mg/dL 8.5(L) 9.1 8.6(L)  Total Protein 6.5 - 8.1 g/dL 5.8(L) - -  Total Bilirubin 0.3 - 1.2 mg/dL 0.9 - -  Alkaline Phos 38 - 126 U/L 145(H) - -  AST 15 - 41 U/L 38 - -  ALT 0 - 44 U/L 51(H) - -  Disposition: Discharge disposition: 01-Home or Self Care     Home   Allergies as of 10/15/2019   No Known Allergies     Medication List    STOP taking these medications   cetirizine 10 MG tablet Commonly known as: ZYRTEC     TAKE these medications   acetaminophen 500 MG tablet Commonly known as: TYLENOL Take 2 tablets (1,000 mg total) by mouth every 8 (eight) hours.   alum & mag hydroxide-simeth 200-200-20 MG/5ML suspension Commonly known  as: MAALOX/MYLANTA Take 15 mLs by mouth every 6 (six) hours as needed for indigestion or heartburn.   aspirin EC 81 MG tablet Take 1 tablet (81 mg total) by mouth daily.   multivitamins with iron Tabs tablet You should get a Women's One a day vitamin with iron and take daily.  This will help with your low hemoglobin.  You can buy this at any drug store, and choose whatever brand you like.   ondansetron 4 MG disintegrating tablet Commonly known as: ZOFRAN-ODT Take 1 tablet (4 mg total) by mouth every 6 (six) hours as needed for nausea.   oxyCODONE 5 MG immediate release tablet Commonly known as: Oxy IR/ROXICODONE Take 1 tablet (5 mg total) by mouth every 6 (six) hours as needed for severe pain or breakthrough pain.   saccharomyces boulardii 250 MG capsule Commonly known as: FLORASTOR This is a probiotic, you can buy over the counter at any drug store. You can choose any brand you like. Take it as directed on the package for the next 3 weeks.            Durable Medical Equipment  (From admission, onward)         Start     Ordered   10/14/19 1157  For home use only DME 3 n 1  Once     10/14/19 1156   10/12/19 1520  DME Walker rolling  Once    Comments: To help patient transfer and ambulate.  Physical / Occupational Therapy may change type of walker PRN.  Question Answer Comment  Patient needs a walker to treat with the following condition Status post partial colectomy   Patient needs a walker to treat with the following condition Physical deconditioning      10/12/19 1520         Follow-up Information    Clovis Riley, MD Follow up on 10/29/2019.   Specialty: General Surgery Why:  Your appointment is at 10:30 AM. Be at the office 30 minutes early for check in.  Bring photo ID and insurance information with you.   Contact information: [address] [place] [address] [facility] Follow up.   Specialty:  Home Health Services Contact information: [place] [address] [phone]        [facility] [place] Follow up on 10/19/2019.   Specialty: General Surgery Why:  Your appointment 3:00 PM.for staple removal by the nursing staff.  Be at the office 30 minutes early for check in.  Bring photo ID and insurance information.   Contact information: [address] [place] [address] [phone]        Shirline Frees, MD Follow up.   Specialty: Family Medicine Why: CAll for follow up appointment and review all his medicines and address all medical issues.   Contact information: [place] [address] [phone]  SignedEarnstine Regal 10/15/2019, 12:04 PM

## 2019-10-14 NOTE — TOC Progression Note (Addendum)
Transition of Care [facility]) - Progression Note    Patient Details  Name: Randy Merritt MRN: [id number] Date of Birth: 1941/06/04  Transition of Care [facility] CM/SW Contact  Jacalyn Lefevre Edson Snowball, RN Phone Number: 10/14/2019, 11:54 AM  Clinical Narrative:     Patient consented for NCM to call wife [name] [phone]. Spoke to Mrs Eimers she is agreeable to home health PT and would like home health agency to call her directly to discuss.  Her work number is [phone] .She has no preference.  Cory with [facility] accepted referral. [facility] will call wife in morning.   Called Zack with [facility] for walker and 3 in1 asked to bring DME to room today .   Expected Discharge Plan: [place]    Expected Discharge Plan and Services Expected Discharge Plan: [place]   Discharge Planning Services: CM Consult Post Acute Care Choice: Home Health, Durable Medical Equipment Living arrangements for the past 2 months: Single Family Home                                       Social Determinants of Health (SDOH) Interventions    Readmission Risk Interventions No flowsheet data found.

## 2019-10-15 ENCOUNTER — Ambulatory Visit: Payer: 59 | Admitting: Endocrinology

## 2019-10-15 ENCOUNTER — Inpatient Hospital Stay (HOSPITAL_COMMUNITY): Payer: 59

## 2019-10-15 LAB — COMPREHENSIVE METABOLIC PANEL
ALT: 51 U/L — ABNORMAL HIGH (ref 0–44)
AST: 38 U/L (ref 15–41)
Albumin: 2.6 g/dL — ABNORMAL LOW (ref 3.5–5.0)
Alkaline Phosphatase: 145 U/L — ABNORMAL HIGH (ref 38–126)
Anion gap: 6 (ref 5–15)
BUN: 6 mg/dL — ABNORMAL LOW (ref 8–23)
CO2: 23 mmol/L (ref 22–32)
Calcium: 8.5 mg/dL — ABNORMAL LOW (ref 8.9–10.3)
Chloride: 109 mmol/L (ref 98–111)
Creatinine, Ser: 1.09 mg/dL (ref 0.61–1.24)
GFR calc Af Amer: >60 mL/min (ref >60)
GFR calc non Af Amer: >60 mL/min (ref >60)
Glucose, Bld: 98 mg/dL (ref 70–99)
Potassium: 3.6 mmol/L (ref 3.5–5.1)
Sodium: 138 mmol/L (ref 135–145)
Total Bilirubin: 0.9 mg/dL (ref 0.3–1.2)
Total Protein: 5.8 g/dL — ABNORMAL LOW (ref 6.5–8.1)

## 2019-10-15 LAB — CBC
HCT: 26.8 % — ABNORMAL LOW (ref 39.0–52.0)
Hemoglobin: 8.9 g/dL — ABNORMAL LOW (ref 13.0–17.0)
MCH: 31.3 pg (ref 26.0–34.0)
MCHC: 33.2 g/dL (ref 30.0–36.0)
MCV: 94.4 fL (ref 80.0–100.0)
Platelets: 239 10*3/uL (ref 150–400)
RBC: 2.84 MIL/uL — ABNORMAL LOW (ref 4.22–5.81)
RDW: 13.2 % (ref 11.5–15.5)
WBC: 5 10*3/uL (ref 4.0–10.5)
nRBC: 0 % (ref 0.0–0.2)

## 2019-10-15 MED ORDER — OXYCODONE HCL 5 MG PO TABS: 5.0000 mg | ORAL_TABLET | Freq: Four times a day (QID) | ORAL | 0 refills | Status: DC | PRN

## 2019-10-15 MED ORDER — ASPIRIN EC 81 MG PO TBEC: 81.0000 mg | DELAYED_RELEASE_TABLET | Freq: Every day | ORAL | Status: DC

## 2019-10-15 MED ORDER — TAB-A-VITE/IRON PO TABS: ORAL_TABLET | ORAL | 0 refills | Status: DC

## 2019-10-15 MED ORDER — ACETAMINOPHEN 500 MG PO TABS: 1000.0000 mg | ORAL_TABLET | Freq: Three times a day (TID) | ORAL | 0 refills | Status: AC

## 2019-10-15 MED ORDER — TAB-A-VITE/IRON PO TABS
1.0000 | ORAL_TABLET | Freq: Every day | ORAL | Status: DC
  Filled 2019-10-15: qty 1

## 2019-10-15 MED ORDER — ACETAMINOPHEN 160 MG/5ML PO SOLN
1000.0000 mg | Freq: Four times a day (QID) | ORAL | Status: DC
  Filled 2019-10-15: qty 40.6

## 2019-10-15 MED ORDER — ONDANSETRON 4 MG PO TBDP: 4.0000 mg | ORAL_TABLET | Freq: Four times a day (QID) | ORAL | 0 refills | Status: DC | PRN

## 2019-10-15 MED ORDER — SACCHAROMYCES BOULARDII 250 MG PO CAPS: ORAL_CAPSULE | ORAL | Status: DC

## 2019-10-15 NOTE — Progress Notes (Signed)
9 Days Post-Op    CC: Abdominal pain  Subjective: He seems to be improving, it looks like he is taking a little more p.o.  He relates some swallowing issues in the past, and says his wife adjusted for that at home.  He has a modified barium swallow from 11/21/2017, they recommended regular solids and thin liquids with small sips and bites and multiple swallows after each bite.  They also recommended he be out of bed and sitting up during meals and after meals.   His abdomen is still distended, he does have some bowel sounds, he reported BM yesterday and it seems to be more normal.  He had some bloody drainage on his abdominal dressing.  Took the dressing down and thoroughly clean the abdominal wound.  I did not see any areas that look like it was going to drain or needed to be opened.  Objective: Vital signs in last 24 hours: Temp:  [97.6 F (36.4 C)-100.3 F (37.9 C)] 100.3 F (37.9 C) (12/03 0646) Pulse Rate:  [68-81] 81 (12/03 0646) Resp:  [17-18] 17 (12/03 0646) BP: (132-140)/(61-95) 140/61 (12/03 0646) SpO2:  [100 %] 100 % (12/03 0646) Last BM Date: 10/14/19 277 p.o. IVs discontinued Urine output recorded T-max 100.3, other vital signs stable. A.m. labs show his creatinine is down to 1.09.  Alk phos elevated 145, ALT 51 WBC 5.0 H/H 8.9/26.8 Intake/Output from previous day: 12/02 0701 - 12/03 0700 In: 277 [P.O.:277] Out: 0  Intake/Output this shift: No intake/output data recorded.  General appearance: alert, cooperative and no distress Resp: clear to auscultation bilaterally GI: His abdomen still distended, but he is tolerating a soft diet, he is having more normal bowel movement(1 yesterday)  Lab Results:  Recent Labs    10/15/19 0153  WBC 5.0  HGB 8.9*  HCT 26.8*  PLT 239    BMET Recent Labs    10/13/19 0945 10/15/19 0153  NA 139 138  K 3.6 3.6  CL 104 109  CO2 23 23  GLUCOSE 89 98  BUN 13 6*  CREATININE 1.26* 1.09  CALCIUM 9.1 8.5*   PT/INR No  results for input(s): LABPROT, INR in the last 72 hours.  Recent Labs  Lab 10/15/19 0153  AST 38  ALT 51*  ALKPHOS 145*  BILITOT 0.9  PROT 5.8*  ALBUMIN 2.6*     Lipase  No results found for: LIPASE   Medications: . acetaminophen  1,000 mg Oral Q6H  . aspirin EC  81 mg Oral Daily  . Chlorhexidine Gluconate Cloth  6 each Topical Daily  . enoxaparin (LOVENOX) injection  40 mg Subcutaneous Q24H  . finasteride  5 mg Oral QHS  . loratadine  10 mg Oral Daily  . methocarbamol  750 mg Oral TID  . montelukast  10 mg Oral QHS  . multivitamins with iron  1 tablet Oral Daily  . pantoprazole  40 mg Oral QHS  . potassium chloride  20 mEq Oral BID  . rosuvastatin  10 mg Oral QPM  . saccharomyces boulardii  250 mg Oral BID  . traZODone  50 mg Oral QHS    Assessment/Plan Anemia AKI Hypokalemia/hypophosphatemia-replaced Anemia Thrombocytopenia Leukopenia  Iatrogenic colon perforation from colonoscopy 10/06/2019 Dr. Earlean Shawl Exploratory laparotomy, segmental sigmoid colectomy with colorectal anastomosis, appendectomy, Dr. Jens Som, 10/06/2019; POD#9 -Postop ileus  FEN: Soft diet/IV fluids ID: Zosyn 11/24>> day7 DVT: Lovenox  Plan: I will recheck a plain film on him, and will review with Dr. Rosendo Gros.  LOS: 9 days    Haizel Gatchell 10/15/2019 Please see Amion

## 2019-10-15 NOTE — Discharge Instructions (Signed)
[facility], [place] [phone]  OPEN ABDOMINAL SURGERY: POST OP INSTRUCTIONS Clean abdominal wound with soap and water 1-2 times per day.  Call if there is any problem with the incision.  Always review your discharge instruction sheet given to you by the facility where your surgery was performed.  IF YOU HAVE DISABILITY OR FAMILY LEAVE FORMS, YOU MUST BRING THEM TO THE OFFICE FOR PROCESSING.  PLEASE DO NOT GIVE THEM TO YOUR DOCTOR.  1. A prescription for pain medication may be given to you upon discharge.  Take your pain medication as prescribed, if needed.  If narcotic pain medicine is not needed, then you may take acetaminophen (Tylenol) or ibuprofen (Advil) as needed. 2. Take your usually prescribed medications unless otherwise directed. 3. If you need a refill on your pain medication, please contact your pharmacy. They will contact our office to request authorization.  Prescriptions will not be filled after 5pm or on week-ends. 4. You should follow a light diet the first few days after arrival home, such as soup and crackers, pudding, etc.unless your doctor has advised otherwise. A high-fiber, low fat diet can be resumed as tolerated.   Be sure to include lots of fluids daily. Most patients will experience some swelling and bruising on the chest and neck area.  Ice packs will help.  Swelling and bruising can take several days to resolve 5. Most patients will experience some swelling and bruising in the area of the incision. Ice pack will help. Swelling and bruising can take several days to resolve..  6. It is common to experience some constipation if taking pain medication after surgery.  Increasing fluid intake and taking a stool softener will usually help or prevent this problem from occurring.  A mild laxative (Milk of Magnesia or Miralax) should be taken according to package directions if there are no bowel movements after 48 hours. 7.  You may have steri-strips (small  skin tapes) in place directly over the incision.  These strips should be left on the skin for 7-10 days.  If your surgeon used skin glue on the incision, you may shower in 24 hours.  The glue will flake off over the next 2-3 weeks.  Any sutures or staples will be removed at the office during your follow-up visit. You may find that a light gauze bandage over your incision may keep your staples from being rubbed or pulled. You may shower and replace the bandage daily. 8. ACTIVITIES:  You may resume regular (light) daily activities beginning the next day--such as daily self-care, walking, climbing stairs--gradually increasing activities as tolerated.  You may have sexual intercourse when it is comfortable.  Refrain from any heavy lifting or straining until approved by your doctor. a. You may drive when you no longer are taking prescription pain medication, you can comfortably wear a seatbelt, and you can safely maneuver your car and apply brakes b. Return to Work: ___________________________________ 19. You should see your doctor in the office for a follow-up appointment approximately two weeks after your surgery.  Make sure that you call for this appointment within a day or two after you arrive home to insure a convenient appointment time. OTHER INSTRUCTIONS:  _____________________________________________________________ _____________________________________________________________  WHEN TO CALL YOUR DOCTOR: 1. Fever over 101.0 2. Inability to urinate 3. Nausea and/or vomiting 4. Extreme swelling or bruising 5. Continued bleeding from incision. 6. Increased pain, redness, or drainage from the incision. 7. Difficulty swallowing or breathing 8. Muscle  cramping or spasms. 9. Numbness or tingling in hands or feet or around lips.  The clinic staff is available to answer your questions during regular business hours.  Please dont hesitate to call and ask to speak to one of the nurses if you have  concerns.  For further questions, please visit [email or web address]   Soft-Food Eating Plan Stick to this for then next week, then you can start eating as you normally would.  A soft-food eating plan includes foods that are safe and easy to chew and swallow. Your health care provider or dietitian can help you find foods and flavors that fit into this plan. Follow this plan until your health care provider or dietitian says it is safe to start eating other foods and food textures. What are tips for following this plan? General guidelines   Take small bites of food, or cut food into pieces about  inch or smaller. Bite-sized pieces of food are easier to chew and swallow.  Eat moist foods. Avoid overly dry foods.  Avoid foods that: ? Are difficult to swallow, such as dry, chunky, crispy, or sticky foods. ? Are difficult to chew, such as hard, tough, or stringy foods. ? Contain nuts, seeds, or fruits.  Follow instructions from your dietitian about the types of liquids that are safe for you to swallow. You may be allowed to have: ? Thick liquids only. This includes only liquids that are thicker than honey. ? Thin and thick liquids. This includes all beverages and foods that become liquid at room temperature.  To make thick liquids: ? Purchase a commercial liquid thickening powder. These are available at grocery stores and pharmacies. ? Mix the thickener into liquids according to instructions on the label. ? Purchase ready-made thickened liquids. ? Thicken soup by pureeing, straining to remove chunks, and adding flour, potato flakes, or corn starch. ? Add commercial thickener to foods that become liquid at room temperature, such as milk shakes, yogurt, ice cream, gelatin, and sherbet.  Ask your health care provider whether you need to take a fiber supplement. Cooking  Cook meats so they stay tender and moist. Use methods like braising, stewing, or baking in liquid.  Cook  vegetables and fruit until they are soft enough to be mashed with a fork.  Peel soft, fresh fruits such as peaches, nectarines, and melons.  When making soup, make sure chunks of meat and vegetables are smaller than  inch.  Reheat leftover foods slowly so that a tough crust does not form. What foods are allowed? The items listed below may not be a complete list. Talk with your dietitian about what dietary choices are best for you. Grains Breads, muffins, pancakes, or waffles moistened with syrup, jelly, or butter. Dry cereals well-moistened with milk. Moist, cooked cereals. Well-cooked pasta and rice. Vegetables All soft-cooked vegetables. Shredded lettuce. Fruits All canned and cooked fruits. Soft, peeled fresh fruits. Strawberries. Dairy Milk. Cream. Yogurt. Cottage cheese. Soft cheese without the rind. Meats and other protein foods Tender, moist ground meat, poultry, or fish. Meat cooked in gravy or sauces. Eggs. Sweets and desserts Ice cream. Milk shakes. Sherbet. Pudding. Fats and oils Butter. Margarine. Olive, canola, sunflower, and grapeseed oil. Smooth salad dressing. Smooth cream cheese. Mayonnaise. Gravy. What foods are not allowed? The items listed bemay not be a complete list. Talk with your dietitian about what dietary choices are best for you. Grains Coarse or dry cereals, such as bran, granola, and shredded wheat. Tough or chewy crusty breads, such  as [place] bread or baguettes. Breads with nuts, seeds, or fruit. Vegetables All raw vegetables. Cooked corn. Cooked vegetables that are tough or stringy. Tough, crisp, fried potatoes and potato skins. Fruits Fresh fruits with skins or seeds, or both, such as apples, pears, and grapes. Stringy, high-pulp fruits, such as papaya, pineapple, coconut, and mango. Fruit leather and all dried fruit. Dairy Yogurt with nuts or coconut. Meats and other protein foods Hard, dry sausages. Dry meat, poultry, or fish. Meats with gristle.  Fish with bones. Fried meat or fish. Lunch meat and hotdogs. Nuts and seeds. Chunky peanut butter or other nut butters. Sweets and desserts Cakes or cookies that are very dry or chewy. Desserts with dried fruit, nuts, or coconut. Fried pastries. Very rich pastries. Fats and oils Cream cheese with fruit or nuts. Salad dressings with seeds or chunks. Summary  A soft-food eating plan includes foods that are safe and easy to swallow. Generally, the foods should be soft enough to be mashed with a fork.  Avoid foods that are dry, hard to chew, crunchy, sticky, stringy, or crispy.  Ask your health care provider whether you need to thicken your liquids and if you need to take a fiber supplement. This information is not intended to replace advice given to you by your health care provider. Make sure you discuss any questions you have with your health care provider. Document Released: 02/05/2008 Document Revised: 02/19/2019 Document Reviewed: 01/01/2017 Elsevier Patient Education  2020 [employer].

## 2019-12-08 ENCOUNTER — Other Ambulatory Visit: Payer: Self-pay

## 2019-12-10 ENCOUNTER — Ambulatory Visit: Payer: 59 | Admitting: Endocrinology

## 2019-12-24 ENCOUNTER — Encounter: Payer: Self-pay | Admitting: Endocrinology

## 2019-12-24 ENCOUNTER — Ambulatory Visit: Payer: 59 | Admitting: Endocrinology

## 2019-12-24 ENCOUNTER — Other Ambulatory Visit: Payer: Self-pay

## 2019-12-24 DIAGNOSIS — E049 Nontoxic goiter, unspecified: Secondary | ICD-10-CM

## 2019-12-24 NOTE — Patient Instructions (Signed)
Let's check the ultrasound.  you will receive a phone call, about a day and time for an appointment. 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  this is usually a slow process, happening over the span of many years. I would be happy to see you back here as needed

## 2019-12-24 NOTE — Progress Notes (Addendum)
Subjective:    Patient ID: Randy Merritt, male    DOB: Sep 27, 1941, 79 y.o.   MRN: [id number]  HPI Pt is referred by Dr Kenton Kingfisher, for goiter.  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  He has no h/o XRT to the neck.  He has been seen by ENT for dysphagia.   Past Medical History:  Diagnosis Date  . Acid reflux   . Anxiety   . Arthritis   . Depression    " a little"  . Goiter   . High cholesterol   . History of blood transfusion   . Perforated bowel ([place])   . Seasonal allergies     Past Surgical History:  Procedure Laterality Date  . ANTERIOR CERVICAL DECOMP/DISCECTOMY FUSION N/A 04/08/2013   Procedure: ANTERIOR CERVICAL DECOMPRESSION/DISCECTOMY FUSION 1 LEVEL;  Surgeon: Floyce Stakes, MD;  [facility];  Service: Neurosurgery;  Laterality: N/A;  Cervical five-six Anterior cervical decompression/diskectomy fusion  . APPENDECTOMY N/A 10/06/2019   Procedure: APPENDECTOMY;  Surgeon: Clovis Riley, MD;  Location: [facility];  Service: General;  Laterality: N/A;  . BIOPSY  01/30/2019   Procedure: BIOPSY;  Surgeon: Carol Ada, MD;  [facility];  Service: Endoscopy;;  . CERVICAL DISCECTOMY     x2  . CHOLECYSTECTOMY    . COLON RESECTION SIGMOID N/A 10/06/2019   Procedure: COLON RESECTION SIGMOID;  Surgeon: Clovis Riley, MD;  Location: [facility];  Service: General;  Laterality: N/A;  . ESOPHAGEAL DILATION  01/30/2019   Procedure: ESOPHAGEAL DILATION;  Surgeon: Carol Ada, MD;  [facility];  Service: Endoscopy;;  [place]  . ESOPHAGOGASTRODUODENOSCOPY (EGD) WITH PROPOFOL N/A 01/30/2019   Procedure: ESOPHAGOGASTRODUODENOSCOPY (EGD) WITH PROPOFOL;  Surgeon: Carol Ada, MD;  [facility];  Service: Endoscopy;  Laterality: N/A;  . gallstone removal    . LAPAROTOMY N/A 10/06/2019   Procedure: EXPLORATORY LAPAROTOMY;  Surgeon: Clovis Riley, MD;  [facility];  Service: General;  Laterality: N/A;    Social History   Socioeconomic History    . Marital status: Married    Spouse name: Not on file  . Number of children: Not on file  . Years of education: Not on file  . Highest education level: Not on file  Occupational History  . Not on file  Tobacco Use  . Smoking status: Former Research scientist (life sciences)  . Smokeless tobacco: Never Used  Substance and Sexual Activity  . Alcohol use: No  . Drug use: No  . Sexual activity: Not on file  Other Topics Concern  . Not on file  Social History Narrative  . Not on file   Social Determinants of Health   Financial Resource Strain:   . Difficulty of Paying Living Expenses: Not on file  Food Insecurity:   . Worried About Charity fundraiser in the Last Year: Not on file  . Ran Out of Food in the Last Year: Not on file  Transportation Needs:   . Lack of Transportation (Medical): Not on file  . Lack of Transportation (Non-Medical): Not on file  Physical Activity:   . Days of Exercise per Week: Not on file  . Minutes of Exercise per Session: Not on file  Stress:   . Feeling of Stress : Not on file  Social Connections:   . Frequency of Communication with Friends and Family: Not on file  . Frequency of Social Gatherings with Friends and Family: Not on file  . Attends Religious Services:  Not on file  . Active Member of Clubs or Organizations: Not on file  . Attends Archivist Meetings: Not on file  . Marital Status: Not on file  Intimate Partner Violence:   . Fear of Current or Ex-Partner: Not on file  . Emotionally Abused: Not on file  . Physically Abused: Not on file  . Sexually Abused: Not on file    Current Outpatient Medications on File Prior to Visit  Medication Sig Dispense Refill  . acetaminophen (TYLENOL) 500 MG tablet Take 2 tablets (1,000 mg total) by mouth every 8 (eight) hours. 30 tablet 0  . alum & mag hydroxide-simeth (MAALOX/MYLANTA) 200-200-20 MG/5ML suspension Take 15 mLs by mouth every 6 (six) hours as needed for indigestion or heartburn. 355 mL 0  . aspirin EC  81 MG tablet Take 1 tablet (81 mg total) by mouth daily.    . Multiple Vitamins-Iron (MULTIVITAMINS WITH IRON) TABS tablet You should get a Women's One a day vitamin with iron and take daily.  This will help with your low hemoglobin.  You can buy this at any drug store, and choose whatever brand you like.  0  . oxyCODONE (OXY IR/ROXICODONE) 5 MG immediate release tablet Take 1 tablet (5 mg total) by mouth every 6 (six) hours as needed for severe pain or breakthrough pain. 20 tablet 0   No current facility-administered medications on file prior to visit.    No Known Allergies  Family History  Problem Relation Age of Onset  . Healthy Mother   . Healthy Father   . Thyroid disease Neg Hx     BP 132/80 (BP Location: Left Arm, Patient Position: Sitting, Cuff Size: Normal)   Pulse 76   Ht 5\' 8"  (1.727 m)   Wt 153 lb 9.6 oz (69.7 kg)   SpO2 99%   BMI 23.35 kg/m     Review of Systems Denies weight change, hoarseness, neck pain, sob, diarrhea, itching, flushing, and depression.  He has a slight cough, cold intolerance, and rhinorrhea.       Objective:   Physical Exam VITAL SIGNS:  See vs page GENERAL: no distress Both eac's and tm's are normal NECK: Neck: a healed scar is present (old c-spine procedure).  I do not appreciate a nodule in the thyroid or elsewhere in the neck.   LUNGS:  Clear to auscultation HEART:  Regular rate and rhythm without murmurs noted. Normal S1,S2.   SKIN: not diaphoretic NEURO: no tremor   Lab Results  Component Value Date   TSH 0.357 09/20/2019   I have reviewed outside records, and summarized: Pt was noted to have goiter, and referred here.  He was seen by GI, and Ba swallow was ordered.    Cytology: bilat thyroid bxs were done in 2012.  Both sides showed non-neoplastic goiter.     Assessment & Plan:  Goiter, new to me: [place] will better define this Low-normal TSH: I told pt he is at risk for hyperthyroidism.   Patient Instructions  Let's check  the ultrasound.  you will receive a phone call, about a day and time for an appointment. 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  this is usually a slow process, happening over the span of many years. I would be happy to see you back here as needed

## 2020-01-01 ENCOUNTER — Ambulatory Visit
Admission: RE | Admit: 2020-01-01 | Discharge: 2020-01-01 | Disposition: A | Discharge status: Home / Self Care | Payer: 59 | Source: Ambulatory Visit | Attending: Endocrinology | Admitting: Endocrinology

## 2020-01-01 DIAGNOSIS — E049 Nontoxic goiter, unspecified: Secondary | ICD-10-CM

## 2020-01-02 ENCOUNTER — Other Ambulatory Visit: Payer: Self-pay | Admitting: Endocrinology

## 2020-01-02 DIAGNOSIS — E049 Nontoxic goiter, unspecified: Secondary | ICD-10-CM

## 2020-01-04 ENCOUNTER — Telehealth: Payer: Self-pay

## 2020-01-04 ENCOUNTER — Telehealth: Payer: Self-pay | Admitting: Endocrinology

## 2020-01-04 NOTE — Telephone Encounter (Signed)
Patient requests to be called at ph# [phone] re: patient states he wants to find out when the appointment for patient's test is and where it is located.

## 2020-01-04 NOTE — Telephone Encounter (Signed)
Imaging results reviewed by Dr. Loanne Drilling. Called pt to inform about results as well as new orders. LVM requesting returned call. Also wanting to provide pt with contact info for self scheduling of appt.  NM THYROID SNG UPTAKE W/IMAGING   Linked chargeables: CHG THYROID UPTAKE W/BLOOD FLOW SNGLE/MULT QUAN MEAS [id number] (CPT)]  HC NM THYROID SNG MULT UPTAKE W IMAGING [[id number]]  CHG THYROID UPTAKE W/BLOOD FLOW SNGLE/MULT QUAN MEAS [id number] (CPT)]  Associated diagnosis: E04.9 (ICD-10-CM) - Goiter  Date: 01/02/2020  Provider: Renato Shin, MD  Department: [facility]  Reason for exam: is the large right nodule hyperfuctioning?

## 2020-01-04 NOTE — Telephone Encounter (Signed)
Called pt and informed of all information below. Also provided contact # for Central Scheduling for pt to self schedule appt. Verbalized acceptance and understanding of all information provided below.

## 2020-01-04 NOTE — Telephone Encounter (Signed)
-----   Message from Renato Shin, MD sent at 01/02/2020  1:26 PM EST ----- please contact patient: The right sided lump is growing, so we need to know more about it.  Let's check a nuc med scan.  It is easy and painless.  you will receive a phone call, about a day and time for an appointment.

## 2020-01-04 NOTE — Telephone Encounter (Signed)
Pt was provided contact information for [employer] of [phone]. Location, date and time of appt is SELF GUIDED as pt is the one SELF SCHEDULING appt. Our office would not have this information.

## 2020-01-07 ENCOUNTER — Other Ambulatory Visit: Payer: Self-pay | Admitting: Endocrinology

## 2020-01-07 DIAGNOSIS — E049 Nontoxic goiter, unspecified: Secondary | ICD-10-CM

## 2020-01-08 NOTE — Telephone Encounter (Signed)
Patient's wife [name] requests to be called at ph# [phone] re: [facility] called patient to schedule appointment for 01/21/20 for test and when asked what the appointment was about -Scheduler told [name] to call Dr. Cordelia Pen office and gave patient phone# to our office to find out details regarding what the appointment is about/why patient is having the test.

## 2020-01-08 NOTE — Telephone Encounter (Signed)
This is in regards to the [facility] order placed by Dr. Loanne Drilling. Please call to further discuss this appt

## 2020-01-08 NOTE — Telephone Encounter (Signed)
I have already spoken with the patients wife and gave her all of the information and the patient is scheduled

## 2020-01-21 ENCOUNTER — Encounter (HOSPITAL_COMMUNITY): Payer: 59

## 2020-01-22 ENCOUNTER — Encounter (HOSPITAL_COMMUNITY): Payer: 59

## 2020-02-01 NOTE — Telephone Encounter (Signed)
Spoke to rep at [facility] and gave her the clinical information again to initiate PA-once we were done the outcome was that clinical notes needed to be faxed to [phone]- the case # is [id number] and to check the status call [phone]

## 2020-02-03 ENCOUNTER — Encounter (HOSPITAL_COMMUNITY): Payer: 59

## 2020-02-04 ENCOUNTER — Encounter (HOSPITAL_COMMUNITY): Payer: 59

## 2020-02-15 ENCOUNTER — Encounter (HOSPITAL_COMMUNITY)
Admission: RE | Admit: 2020-02-15 | Discharge: 2020-02-15 | Disposition: A | Discharge status: Home / Self Care | Payer: 59 | Source: Ambulatory Visit | Attending: Endocrinology | Admitting: Endocrinology

## 2020-02-15 ENCOUNTER — Other Ambulatory Visit: Payer: Self-pay

## 2020-02-15 DIAGNOSIS — E049 Nontoxic goiter, unspecified: Secondary | ICD-10-CM | POA: Diagnosis not present

## 2020-02-15 MED ORDER — SODIUM IODIDE I-123 7.4 MBQ CAPS
448.5000 | ORAL_CAPSULE | Freq: Once | ORAL | Status: AC
  Administered 2020-02-15: 448.5 via ORAL

## 2020-02-16 ENCOUNTER — Encounter (HOSPITAL_COMMUNITY)
Admission: RE | Admit: 2020-02-16 | Discharge: 2020-02-16 | Disposition: A | Discharge status: Home / Self Care | Payer: 59 | Source: Ambulatory Visit | Attending: Endocrinology | Admitting: Endocrinology

## 2020-02-17 ENCOUNTER — Telehealth: Payer: Self-pay

## 2020-02-17 NOTE — Telephone Encounter (Signed)
IMAGING RESULTS  Imaging results were reviewed by Dr. Loanne Drilling. A letter has been mailed to pt home address. For future reference, letter can be found in [place].

## 2020-02-17 NOTE — Telephone Encounter (Signed)
-----   Message from Renato Shin, MD sent at 02/16/2020  6:27 PM EDT ----- please contact patient: Thyroid is unlikely to be anything to worry about.  However, we should continue to follow this.  Please come back for a follow-up appointment in 3 months.

## 2020-04-05 ENCOUNTER — Other Ambulatory Visit: Payer: Self-pay

## 2020-04-05 ENCOUNTER — Encounter: Payer: Self-pay | Admitting: Allergy and Immunology

## 2020-04-05 ENCOUNTER — Ambulatory Visit: Payer: 59 | Admitting: Allergy and Immunology

## 2020-04-05 VITALS — BP 130/70 | HR 64 | Temp 97.2°F | Resp 16 | Ht 67.0 in | Wt 159.0 lb

## 2020-04-05 DIAGNOSIS — K219 Gastro-esophageal reflux disease without esophagitis: Secondary | ICD-10-CM

## 2020-04-05 DIAGNOSIS — D649 Anemia, unspecified: Secondary | ICD-10-CM | POA: Diagnosis not present

## 2020-04-05 DIAGNOSIS — J3089 Other allergic rhinitis: Secondary | ICD-10-CM

## 2020-04-05 MED ORDER — FLUTICASONE PROPIONATE 50 MCG/ACT NA SUSP: NASAL | 5 refills | Status: DC

## 2020-04-05 MED ORDER — PANTOPRAZOLE SODIUM 40 MG PO TBEC: 40.0000 mg | DELAYED_RELEASE_TABLET | Freq: Two times a day (BID) | ORAL | 5 refills | Status: DC

## 2020-04-05 MED ORDER — MONTELUKAST SODIUM 10 MG PO TABS: ORAL_TABLET | ORAL | 5 refills | Status: DC

## 2020-04-05 MED ORDER — LORATADINE 10 MG PO TABS: ORAL_TABLET | ORAL | 5 refills | Status: DC

## 2020-04-05 MED ORDER — FAMOTIDINE 40 MG PO TABS: ORAL_TABLET | ORAL | 5 refills | Status: DC

## 2020-04-05 NOTE — Progress Notes (Signed)
[facility]   NEW PATIENT NOTE  Referring Provider: No ref. provider found Primary Provider: Shirline Frees, MD Date of office visit: 04/05/2020    Subjective:   Chief Complaint:  Randy Merritt (DOB: [birth year]) is a 79 y.o. male who presents to the clinic on 04/05/2020 with a chief complaint of Gastroesophageal Reflux .  HPI: Cane presents to this clinic in evaluation of respiratory tract symptoms.  Apparently he has many years of "allergies".  Allergies for [place] means that he cannot smell and he has some intermittent nasal congestion and sneezing and some intermittent headache without any neurological features occurring on a perennial basis without any obvious trigger and unresponsive to various medications administered in the past including the use of nasal steroids.  In addition he has constant throat clearing and postnasal drip and a glob stuck in his throat and a swallowing problem and some slight cough.  Commonly food gets stuck in his throat.  It does not sound as though food gets obstructed in his chest.  He does have reflux disease with regurgitation.  He is taking pantoprazole yet still remains symptomatic.  He has some intermittent caffeine throughout the week but no chocolate or alcohol and does not smoke.  He apparently had an upper endoscopy with esophageal dilation in 2020.  Apparently he has had several neck surgeries and has a history of goiter.  Apparently he has seen an ENT doctor in the past, possibly Dr. Erik Obey, and has had rhinoscopy performed.  He is not really sure about the details of that visit.  He does not have any wheezing or chest tightness or shortness of breath with his respiratory tract symptoms.  Past Medical History:  Diagnosis Date  . Acid reflux   . Anxiety   . Arthritis   . Depression    " a little"  . Goiter   . High cholesterol   . History of blood transfusion   . Perforated bowel ([place])    . Seasonal allergies     Past Surgical History:  Procedure Laterality Date  . ANTERIOR CERVICAL DECOMP/DISCECTOMY FUSION N/A 04/08/2013   Procedure: ANTERIOR CERVICAL DECOMPRESSION/DISCECTOMY FUSION 1 LEVEL;  Surgeon: Floyce Stakes, MD;  [facility];  Service: Neurosurgery;  Laterality: N/A;  Cervical five-six Anterior cervical decompression/diskectomy fusion  . APPENDECTOMY N/A 10/06/2019   Procedure: APPENDECTOMY;  Surgeon: Clovis Riley, MD;  Location: [facility];  Service: General;  Laterality: N/A;  . BIOPSY  01/30/2019   Procedure: BIOPSY;  Surgeon: Carol Ada, MD;  [facility];  Service: Endoscopy;;  . CERVICAL DISCECTOMY     x2  . CHOLECYSTECTOMY    . COLON RESECTION SIGMOID N/A 10/06/2019   Procedure: COLON RESECTION SIGMOID;  Surgeon: Clovis Riley, MD;  Location: [facility];  Service: General;  Laterality: N/A;  . ESOPHAGEAL DILATION  01/30/2019   Procedure: ESOPHAGEAL DILATION;  Surgeon: Carol Ada, MD;  [facility];  Service: Endoscopy;;  [place]  . ESOPHAGOGASTRODUODENOSCOPY (EGD) WITH PROPOFOL N/A 01/30/2019   Procedure: ESOPHAGOGASTRODUODENOSCOPY (EGD) WITH PROPOFOL;  Surgeon: Carol Ada, MD;  [facility];  Service: Endoscopy;  Laterality: N/A;  . gallstone removal    . LAPAROTOMY N/A 10/06/2019   Procedure: EXPLORATORY LAPAROTOMY;  Surgeon: Clovis Riley, MD;  Location: [facility];  Service: General;  Laterality: N/A;    Allergies as of 04/05/2020   No Known Allergies     Medication List  acetaminophen 500 MG tablet Commonly known as: TYLENOL Take 2 tablets (1,000 mg total) by mouth every 8 (eight) hours.   alum & mag hydroxide-simeth 200-200-20 MG/5ML suspension Commonly known as: MAALOX/MYLANTA Take 15 mLs by mouth every 6 (six) hours as needed for indigestion or heartburn.   aspirin EC 81 MG tablet Take 1 tablet (81 mg total) by mouth daily.   multivitamins with iron Tabs tablet You should get a  Women's One a day vitamin with iron and take daily.  This will help with your low hemoglobin.  You can buy this at any drug store, and choose whatever brand you like.   oxyCODONE 5 MG immediate release tablet Commonly known as: Oxy IR/ROXICODONE Take 1 tablet (5 mg total) by mouth every 6 (six) hours as needed for severe pain or breakthrough pain.   pantoprazole 40 MG tablet Commonly known as: PROTONIX Take 1 tablet (40 mg total) by mouth 1(one) time daily.     rosuvastatin 10 MG tablet Commonly known as: CRESTOR Take 10 mg by mouth daily.       Review of systems negative except as noted in HPI / PMHx or noted below:  Review of Systems  Constitutional: Negative.   HENT: Negative.   Eyes: Negative.   Respiratory: Negative.   Cardiovascular: Negative.   Gastrointestinal: Negative.   Genitourinary: Negative.   Musculoskeletal: Negative.   Skin: Negative.   Neurological: Negative.   Endo/Heme/Allergies: Negative.   Psychiatric/Behavioral: Negative.     Family History  Problem Relation Age of Onset  . Healthy Mother   . Healthy Father   . Thyroid disease Neg Hx     Social History   Socioeconomic History  . Marital status: Married    Spouse name: Not on file  . Number of children: Not on file  . Years of education: Not on file  . Highest education level: Not on file  Occupational History  . Not on file  Tobacco Use  . Smoking status: Former Research scientist (life sciences)  . Smokeless tobacco: Never Used  Substance and Sexual Activity  . Alcohol use: No  . Drug use: No  . Sexual activity: Not on file  Other Topics Concern  . Not on file  Social History Narrative  . Not on file    Environmental and Social history  Lives in a house with a dry environment, dogs and cats and birds located outside of the household, carpet in the bedroom, no plastic on the bed, no plastic on the pillow, no smoking ongoing with inside the household.  Objective:   Vitals:   04/05/20 0926  BP: 130/70   Pulse: 64  Resp: 16  Temp: (!) 97.2 F (36.2 C)  SpO2: 99%   Height: 5\' 7"  (170.2 cm) Weight: 159 lb (72.1 kg)  Physical Exam Constitutional:      Appearance: He is not diaphoretic.  HENT:     Head: Normocephalic.     Right Ear: Tympanic membrane, ear canal and external ear normal.     Left Ear: Tympanic membrane, ear canal and external ear normal.     Nose: Nose normal. No mucosal edema or rhinorrhea.     Mouth/Throat:     Pharynx: Uvula midline. No oropharyngeal exudate.  Eyes:     Conjunctiva/sclera: Conjunctivae normal.  Neck:     Thyroid: No thyromegaly.     Trachea: Trachea normal. No tracheal tenderness or tracheal deviation.  Cardiovascular:     Rate and Rhythm: Normal rate and regular rhythm.  Heart sounds: Normal heart sounds, S1 normal and S2 normal. No murmur.  Pulmonary:     Effort: No respiratory distress.     Breath sounds: Normal breath sounds. No stridor. No wheezing or rales.  Lymphadenopathy:     Head:     Right side of head: No tonsillar adenopathy.     Left side of head: No tonsillar adenopathy.     Cervical: No cervical adenopathy.  Skin:    Findings: No erythema or rash.     Nails: There is no clubbing.  Neurological:     Mental Status: He is alert.     Diagnostics: Allergy skin tests were performed.  He did not demonstrate any hypersensitivity against a screening panel of aeroallergens or foods    Results of a rhinoscopy performed by Dr. Erik Obey 28 July [id number] identifies the following:  The nasopharynx is clear. Oropharynx is clear. Hypopharynx/larynx reveals pooled frothy secretions in both piriform sinuses and the post cricoid area. Vocal cords are fully mobile and airway is good.  Results of the sinus CT scan obtained on 09 June 2019 identified the following:  Paranasal sinuses:  Frontal: Normally aerated. Patent frontal sinus drainage pathways.  Ethmoid: Clear except for a single opacified anterior ethmoid air cell on the right  not likely significant.  Maxillary: Normally aerated.  Sphenoid: Normally aerated. Patent sphenoethmoidal recesses.  Right ostiomeatal unit: Patent.  Left ostiomeatal unit: Patent.  Nasal passages: Patent. Septum bows 2 mm towards the left.  Anatomy: Pneumatization is present superior to both anterior ethmoid artery notches. Symmetric and intact olfactory grooves and fovea ethmoidalis, Keros II (4-84mm) Presellar sphenoid pneumatization pattern. No dehiscence of carotid or optic canals. No onodi cell.  Results of blood tests obtained 15 October 2019 identifies WBC 5.0, hemoglobin 8.9, platelet 239.  Assessment and Plan:    1. Perennial allergic rhinitis   2. LPRD (laryngopharyngeal reflux disease)   3. Anemia, unspecified type     1.  Allergen avoidance measures?  2.  Treat and prevent inflammation:   A. Montelukast 10 mg - 1 tablet 1 time per day  B. Flonase - 1 spray each nostril 1 time per day  3.  Treat and prevent reflux / LPR:   A. Increase pantoprazole to 40 mg twice a day  B. Start famotidine 40 mg in evening  4.  If needed:   A. Nasal saline  B. Loratadine 10 mg - 1-2 tablets 1 time per day  5.  Hemoglobin = 8.9.  Has this been addressed?  6. Return to clinic in 4 weeks or earlier if problem  Carmon appears to have an inflamed and irritated respiratory tract mostly involving his mid airway and to some degree his upper airway for which she will utilize the therapy noted above directed against respiratory tract inflammation and reflux induced respiratory disease.  In addition, he has a documented hemoglobin of 8.9 in December 2020.  Apparently he has had a colonoscopy performed unfortunately resulting in a colonic tear in late December but I do not see any other further evaluation or follow-up regarding his low hemoglobin on Epic.  I have asked him to follow-up with his primary care doctor about this issue.  I will see him back in his clinic in 4 weeks or earlier  if there is a problem.  Allena Katz, MD Allergy / Immunology [place]

## 2020-04-05 NOTE — Patient Instructions (Addendum)
  1.  Allergen avoidance measures?  2.  Treat and prevent inflammation:   A. Montelukast 10 mg - 1 tablet 1 time per day  B. Flonase - 1 spray each nostril 1 time per day  3.  Treat and prevent reflux / LPR:   A. Increase pantoprazole to 40 mg twice a day  B. Start famotidine 40 mg in evening  4.  If needed:   A. Nasal saline  B. Loratadine 10 mg - 1-2 tablets 1 time per day  5.  Hemoglobin = 8.9.  Has this been addressed?  6. Return to clinic in 4 weeks or earlier if problem

## 2020-04-06 ENCOUNTER — Encounter: Payer: Self-pay | Admitting: Allergy and Immunology

## 2020-04-07 ENCOUNTER — Telehealth: Payer: Self-pay

## 2020-04-07 NOTE — Telephone Encounter (Signed)
Patient called concerned about his Hemoglobin Labs as discussed in his visit with Dr Neldon Mc on 04/05/20. Patient states he has been trying to get his pcp on the phone to see if he did follow up labs since his hospital visit in December 2020. I informed the patient that I would do some digging to get these records over to Dr Neldon Mc. I have received a copy of his labs from 03/04/20 & also hematology results from 08/08/2018. I have faxed the records over to Dr Neldon Mc in [place].   Patient would like someone to give him a call back to make sure everything is okay.  Thanks

## 2020-04-07 NOTE — Telephone Encounter (Signed)
Called and advised patient. Patient verbalized understanding.  

## 2020-04-07 NOTE — Telephone Encounter (Signed)
Please inform patient that I did look at the blood test dated 04 March 2020 and his hemoglobin was 13.6 which is a significant improvement from the earlier hemoglobin that I was able to see on electronic medical records.  As long as his hemoglobin stays elevated and does not continue to fall there is probably no reason for any further evaluation regarding this issue.

## 2020-04-08 NOTE — Telephone Encounter (Signed)
Dr Kozlow please advise 

## 2020-04-08 NOTE — Telephone Encounter (Signed)
Patient called and states he needs to see an ENT referably on [address]. Patient has very thick mucus with color in his throat and it feels like he is swallowing a rock.   Please advise.

## 2020-04-12 NOTE — Telephone Encounter (Signed)
Can we please refer this patient to Dr. Wilburn Cornelia for LPRD please?

## 2020-04-12 NOTE — Telephone Encounter (Signed)
What would be the best diagnosis for him for the referral?

## 2020-04-12 NOTE — Telephone Encounter (Signed)
LPRD (laryngopharyngeal reflux disease)

## 2020-04-12 NOTE — Telephone Encounter (Signed)
Dr. Erik Obey has retired.  Have him see Dr. Wilburn Cornelia in the same practice.

## 2020-04-13 NOTE — Telephone Encounter (Signed)
Referral has been placed to Dr Danie Binder office for scheduling.  They will review and contact the patient.

## 2020-04-14 NOTE — Telephone Encounter (Signed)
Patient will call the ENT to see about getting scheduled.

## 2020-05-10 ENCOUNTER — Ambulatory Visit: Payer: 59 | Admitting: Allergy and Immunology

## 2020-05-20 DIAGNOSIS — R198 Other specified symptoms and signs involving the digestive system and abdomen: Secondary | ICD-10-CM | POA: Insufficient documentation

## 2020-05-20 DIAGNOSIS — R0989 Other specified symptoms and signs involving the circulatory and respiratory systems: Secondary | ICD-10-CM | POA: Insufficient documentation

## 2020-05-24 ENCOUNTER — Encounter (HOSPITAL_COMMUNITY): Payer: Self-pay

## 2020-05-24 ENCOUNTER — Emergency Department (HOSPITAL_COMMUNITY): Payer: 59

## 2020-05-24 ENCOUNTER — Other Ambulatory Visit: Payer: Self-pay

## 2020-05-24 ENCOUNTER — Emergency Department (HOSPITAL_COMMUNITY)
Admission: EM | Admit: 2020-05-24 | Discharge: 2020-05-24 | Disposition: A | Discharge status: Home / Self Care | Payer: 59 | Attending: Emergency Medicine | Admitting: Emergency Medicine

## 2020-05-24 DIAGNOSIS — N401 Enlarged prostate with lower urinary tract symptoms: Secondary | ICD-10-CM | POA: Insufficient documentation

## 2020-05-24 DIAGNOSIS — R110 Nausea: Secondary | ICD-10-CM | POA: Insufficient documentation

## 2020-05-24 DIAGNOSIS — R3911 Hesitancy of micturition: Secondary | ICD-10-CM

## 2020-05-24 DIAGNOSIS — R109 Unspecified abdominal pain: Secondary | ICD-10-CM | POA: Insufficient documentation

## 2020-05-24 DIAGNOSIS — Z7982 Long term (current) use of aspirin: Secondary | ICD-10-CM | POA: Diagnosis not present

## 2020-05-24 DIAGNOSIS — Z9049 Acquired absence of other specified parts of digestive tract: Secondary | ICD-10-CM | POA: Diagnosis not present

## 2020-05-24 DIAGNOSIS — M545 Low back pain: Secondary | ICD-10-CM | POA: Insufficient documentation

## 2020-05-24 DIAGNOSIS — R350 Frequency of micturition: Secondary | ICD-10-CM | POA: Diagnosis present

## 2020-05-24 LAB — COMPREHENSIVE METABOLIC PANEL
ALT: 26 U/L (ref 0–44)
AST: 26 U/L (ref 15–41)
Albumin: 5.2 g/dL — ABNORMAL HIGH (ref 3.5–5.0)
Alkaline Phosphatase: 100 U/L (ref 38–126)
Anion gap: 9 (ref 5–15)
BUN: 16 mg/dL (ref 8–23)
CO2: 30 mmol/L (ref 22–32)
Calcium: 9.5 mg/dL (ref 8.9–10.3)
Chloride: 101 mmol/L (ref 98–111)
Creatinine, Ser: 1.47 mg/dL — ABNORMAL HIGH (ref 0.61–1.24)
GFR calc Af Amer: 52 mL/min — ABNORMAL LOW (ref >60)
GFR calc non Af Amer: 45 mL/min — ABNORMAL LOW (ref >60)
Glucose, Bld: 96 mg/dL (ref 70–99)
Potassium: 4.3 mmol/L (ref 3.5–5.1)
Sodium: 140 mmol/L (ref 135–145)
Total Bilirubin: 0.8 mg/dL (ref 0.3–1.2)
Total Protein: 8.4 g/dL — ABNORMAL HIGH (ref 6.5–8.1)

## 2020-05-24 LAB — URINALYSIS, ROUTINE W REFLEX MICROSCOPIC
Bilirubin Urine: NEGATIVE
Glucose, UA: NEGATIVE mg/dL
Hgb urine dipstick: NEGATIVE
Ketones, ur: NEGATIVE mg/dL
Leukocytes,Ua: NEGATIVE
Nitrite: NEGATIVE
Protein, ur: NEGATIVE mg/dL
Specific Gravity, Urine: >1.046 — ABNORMAL HIGH (ref 1.005–1.030)
pH: 7 (ref 5.0–8.0)

## 2020-05-24 LAB — CBC
HCT: 44.3 % (ref 39.0–52.0)
Hemoglobin: 14.7 g/dL (ref 13.0–17.0)
MCH: 32.5 pg (ref 26.0–34.0)
MCHC: 33.2 g/dL (ref 30.0–36.0)
MCV: 98 fL (ref 80.0–100.0)
Platelets: 145 10*3/uL — ABNORMAL LOW (ref 150–400)
RBC: 4.52 MIL/uL (ref 4.22–5.81)
RDW: 12.6 % (ref 11.5–15.5)
WBC: 3.1 10*3/uL — ABNORMAL LOW (ref 4.0–10.5)
nRBC: 0 % (ref 0.0–0.2)

## 2020-05-24 LAB — LIPASE, BLOOD: Lipase: 21 U/L (ref 11–51)

## 2020-05-24 MED ORDER — SODIUM CHLORIDE 0.9% FLUSH: 3.0000 mL | Freq: Once | INTRAVENOUS | Status: DC

## 2020-05-24 MED ORDER — IOHEXOL 300 MG/ML  SOLN
100.0000 mL | Freq: Once | INTRAMUSCULAR | Status: AC | PRN
  Administered 2020-05-24: 100 mL via INTRAVENOUS

## 2020-05-24 MED ORDER — TAMSULOSIN HCL 0.4 MG PO CAPS: 0.4000 mg | ORAL_CAPSULE | Freq: Every day | ORAL | 0 refills | Status: AC

## 2020-05-24 MED ORDER — SODIUM CHLORIDE (PF) 0.9 % IJ SOLN
INTRAMUSCULAR | Status: AC
  Filled 2020-05-24: qty 50

## 2020-05-24 MED ORDER — ALUM & MAG HYDROXIDE-SIMETH 200-200-20 MG/5ML PO SUSP
30.0000 mL | Freq: Once | ORAL | Status: AC
  Administered 2020-05-24: 30 mL via ORAL
  Filled 2020-05-24: qty 30

## 2020-05-24 MED ORDER — PANTOPRAZOLE SODIUM 40 MG IV SOLR
40.0000 mg | Freq: Once | INTRAVENOUS | Status: AC
  Administered 2020-05-24: 40 mg via INTRAVENOUS
  Filled 2020-05-24: qty 40

## 2020-05-24 NOTE — Discharge Instructions (Addendum)
Please take your tamsulosin, as directed.  This will help reduce your urinary hesitancy and frequency.  Given your reported anxiety and depression, I encourage you to follow-up with your primary care provider for evaluation and management.  You may ultimately benefit from medical management as well as referral to therapist.    Your CT obtained of abdomen and pelvis also demonstrated lumbar impingement and spondylosis which could explain your chronic low back pain.  Please follow up with your primary doctor regarding these findings.    Return to the ED or seek immediate medical attention for any new or worsening symptoms.

## 2020-05-24 NOTE — ED Notes (Signed)
Pt discharged from this ED in stable condition at this time. All discharge instructions and follow up care reviewed with pt with no further questions at this time. Pt ambulatory with steady gait, clear speech.  

## 2020-05-24 NOTE — ED Triage Notes (Signed)
Patient reports that he is having urinary frequency, bilateral low back pain, abdominal tightness, and nausea. Patient unable to state when his symptoms started.

## 2020-05-24 NOTE — ED Provider Notes (Signed)
[facility] DEPT Provider Note   CSN: [id number] Arrival date & time: 05/24/20  0747     History Chief Complaint  Patient presents with  . Back Pain  . Urinary Frequency  . Abdominal Pain  . Nausea    Randy Merritt is a 79 y.o. male with PMH significant for appendectomy, cholecystectomy, and perforated sigmoid colon s/p resection who presents to the ED with complaints of bilateral low back pain, abdominal "tightness, nausea, and increased urinary frequency.  On my exam, patient states that he has been feeling poorly for a while.  He states that he pees frequently in the middle the night which can be difficult for him.  He states that he has also been having some low back discomfort and abdominal "fullness" recently and endorses diminished appetite and nausea symptoms.  He denies any emesis, fevers or chills, difficulty breathing or cough, chest pain, dysuria, incontinence, pain with defecation, hematochezia or melena, or other symptoms.  He states that his last bowel movement was yesterday and that he will often take stool softeners.  Patient is a poor historian.  HPI     Past Medical History:  Diagnosis Date  . Acid reflux   . Anxiety   . Arthritis   . Depression    " a little"  . Goiter   . High cholesterol   . History of blood transfusion   . Perforated bowel ([place])   . Seasonal allergies     Patient Active Problem List   Diagnosis Date Noted  . Goiter 12/24/2019  . Perforated sigmoid colon ([place]) 10/06/2019  . Ingrown toenail 10/11/2017  . Chondrodermatitis nodularis helicis of right ear 22/29/7989  . Oropharyngeal dysphagia 02/02/2017  . Chronic rhinitis 02/01/2017  . Gastroesophageal reflux disease 02/01/2017    Past Surgical History:  Procedure Laterality Date  . ANTERIOR CERVICAL DECOMP/DISCECTOMY FUSION N/A 04/08/2013   Procedure: ANTERIOR CERVICAL DECOMPRESSION/DISCECTOMY FUSION 1 LEVEL;  Surgeon: Floyce Stakes, MD;   Location: [facility];  Service: Neurosurgery;  Laterality: N/A;  Cervical five-six Anterior cervical decompression/diskectomy fusion  . APPENDECTOMY N/A 10/06/2019   Procedure: APPENDECTOMY;  Surgeon: Clovis Riley, MD;  Location: [facility];  Service: General;  Laterality: N/A;  . BIOPSY  01/30/2019   Procedure: BIOPSY;  Surgeon: Carol Ada, MD;  Location: [facility];  Service: Endoscopy;;  . CERVICAL DISCECTOMY     x2  . CHOLECYSTECTOMY    . COLON RESECTION SIGMOID N/A 10/06/2019   Procedure: COLON RESECTION SIGMOID;  Surgeon: Clovis Riley, MD;  Location: [facility];  Service: General;  Laterality: N/A;  . ESOPHAGEAL DILATION  01/30/2019   Procedure: ESOPHAGEAL DILATION;  Surgeon: Carol Ada, MD;  Location: [facility];  Service: Endoscopy;;  [place]  . ESOPHAGOGASTRODUODENOSCOPY (EGD) WITH PROPOFOL N/A 01/30/2019   Procedure: ESOPHAGOGASTRODUODENOSCOPY (EGD) WITH PROPOFOL;  Surgeon: Carol Ada, MD;  Location: [facility];  Service: Endoscopy;  Laterality: N/A;  . gallstone removal    . LAPAROTOMY N/A 10/06/2019   Procedure: EXPLORATORY LAPAROTOMY;  Surgeon: Clovis Riley, MD;  Location: [facility];  Service: General;  Laterality: N/A;       Family History  Problem Relation Age of Onset  . Healthy Mother   . Healthy Father   . Thyroid disease Neg Hx     Social History   Tobacco Use  . Smoking status: Former Research scientist (life sciences)  . Smokeless tobacco: Never Used  Vaping Use  . Vaping Use: Never used  Substance Use Topics  .  Alcohol use: No  . Drug use: No    Home Medications Prior to Admission medications   Medication Sig Start Date End Date Taking? Authorizing Provider  acetaminophen (TYLENOL) 500 MG tablet Take 2 tablets (1,000 mg total) by mouth every 8 (eight) hours. 10/15/19   Earnstine Regal, PA-C  alum & mag hydroxide-simeth (MAALOX/MYLANTA) 200-200-20 MG/5ML suspension Take 15 mLs by mouth every 6 (six) hours as needed for indigestion or heartburn. 06/19/19   Wieters,  Hallie C, PA-C  aspirin EC 81 MG tablet Take 1 tablet (81 mg total) by mouth daily. 10/15/19   Earnstine Regal, PA-C  famotidine (PEPCID) 40 MG tablet 40 mg in the evening 04/05/20   Kozlow, Donnamarie Poag, MD  fluticasone [facility]) 50 MCG/ACT nasal spray 1 spray each nostril 1 time per day 04/05/20   Jiles Prows, MD  loratadine (CLARITIN) 10 MG tablet 1-2 tablets 1 time per day 04/05/20   Jiles Prows, MD  montelukast (SINGULAIR) 10 MG tablet 1 tablet 1 time per day 04/05/20   Jiles Prows, MD  Multiple Vitamins-Iron (MULTIVITAMINS WITH IRON) TABS tablet You should get a Women's One a day vitamin with iron and take daily.  This will help with your low hemoglobin.  You can buy this at any drug store, and choose whatever brand you like. 10/15/19   Earnstine Regal, PA-C  oxyCODONE (OXY IR/ROXICODONE) 5 MG immediate release tablet Take 1 tablet (5 mg total) by mouth every 6 (six) hours as needed for severe pain or breakthrough pain. 10/15/19   Earnstine Regal, PA-C  pantoprazole (PROTONIX) 40 MG tablet Take 1 tablet (40 mg total) by mouth 2 (two) times daily. 04/05/20   Kozlow, Donnamarie Poag, MD  rosuvastatin (CRESTOR) 10 MG tablet Take 10 mg by mouth daily. 03/01/20   [provider]  tamsulosin (FLOMAX) 0.4 MG CAPS capsule Take 1 capsule (0.4 mg total) by mouth daily. 05/24/20 06/23/20  Corena Herter, PA-C    Allergies    Patient has no known allergies.  Review of Systems   Review of Systems  All other systems reviewed and are negative.   Physical Exam Updated Vital Signs BP 134/78 (BP Location: Right Arm)   Pulse (!) 53   Temp 97.6 F (36.4 C) (Oral)   Resp 16   Ht 5\' 8"  (1.727 m)   Wt 79.4 kg   SpO2 100%   BMI 26.61 kg/m   Physical Exam Vitals and nursing note reviewed. Exam conducted with a chaperone present.  Constitutional:      General: He is not in acute distress.    Appearance: Normal appearance. He is not ill-appearing.  HENT:     Head: Normocephalic and atraumatic.    Eyes:     General: No scleral icterus.    Conjunctiva/sclera: Conjunctivae normal.  Cardiovascular:     Rate and Rhythm: Normal rate and regular rhythm.     Pulses: Normal pulses.     Heart sounds: Normal heart sounds.  Pulmonary:     Effort: Pulmonary effort is normal. No respiratory distress.     Breath sounds: Normal breath sounds.  Abdominal:     Comments: Surgical scars noted.  Soft, mildly distended.  No focal areas of TTP.  No guarding.  No overlying skin changes.  Negative CVAT bilaterally.  Musculoskeletal:     Cervical back: Normal range of motion. No rigidity.     Comments: No midline cervical, thoracic, lumbar, or sacral tenderness palpation.  No overlying skin changes.  ROM and strength of extremities intact against resistance.  Skin:    General: Skin is dry.     Capillary Refill: Capillary refill takes less than 2 seconds.  Neurological:     General: No focal deficit present.     Mental Status: He is alert and oriented to person, place, and time.     GCS: GCS eye subscore is 4. GCS verbal subscore is 5. GCS motor subscore is 6.     Sensory: No sensory deficit.     Coordination: Coordination normal.  Psychiatric:        Mood and Affect: Mood normal.        Behavior: Behavior normal.        Thought Content: Thought content normal.     ED Results / Procedures / Treatments   Labs (all labs ordered are listed, but only abnormal results are displayed) Labs Reviewed  COMPREHENSIVE METABOLIC PANEL - Abnormal; Notable for the following components:      Result Value   Creatinine, Ser 1.47 (*)    Total Protein 8.4 (*)    Albumin 5.2 (*)    GFR calc non Af Amer 45 (*)    GFR calc Af Amer 52 (*)    All other components within normal limits  CBC - Abnormal; Notable for the following components:   WBC 3.1 (*)    Platelets 145 (*)    All other components within normal limits  URINALYSIS, ROUTINE W REFLEX MICROSCOPIC - Abnormal; Notable for the following components:    Specific Gravity, Urine >1.046 (*)    All other components within normal limits  LIPASE, BLOOD    EKG None  Radiology CT ABDOMEN PELVIS W CONTRAST  Result Date: 05/24/2020 CLINICAL DATA:  Patient reports that he is having urinary frequency, bilateral low back pain, abdominal tightness, and nausea. Patient unable to state when his symptoms started. EXAM: CT ABDOMEN AND PELVIS WITH CONTRAST TECHNIQUE: Multidetector CT imaging of the abdomen and pelvis was performed using the standard protocol following bolus administration of intravenous contrast. CONTRAST:  147mL OMNIPAQUE IOHEXOL 300 MG/ML  SOLN COMPARISON:  Multiple exams, including abdominal radiograph from 10/15/2019 and CT scan from 04/28/2016 FINDINGS: Lower chest: Unremarkable Hepatobiliary: Cholecystectomy.  Otherwise unremarkable. Pancreas: Unremarkable Spleen: Unremarkable Adrenals/Urinary Tract: Both adrenal glands appear normal. Mild bilateral renal atrophy. The prostate gland indents the bladder base. Tiny hypodense left kidney lower pole lesions are technically nonspecific although statistically likely to be cysts. Stomach/Bowel: Anastomotic staple line in the sigmoid colon. Normal appendix. No dilated bowel. Vascular/Lymphatic: Aortoiliac atherosclerotic vascular disease. No pathologic adenopathy. Reproductive: The prostate gland measures 5.1 by 5.1 by 6.1 cm (volume = 83 cm^3), compatible with prostatomegaly. Lobular prominence along the right seminal vesicle, significance uncertain. Other: No supplemental non-categorized findings. Musculoskeletal: Stable spurring of the left anterior iliac crest. Degenerative subcortical cyst formation anteriorly in both acetabula, chronic. Lower lumbar degenerative facet arthropathy and degenerative disc disease resulting in mostly mild impingement at L3-4, L4-5, and L5-S1. The right foraminal impingement at L4-5 as moderate in degree. IMPRESSION: 1. Prostatomegaly with lobular prominence along the right  seminal vesicle, significance uncertain. 2. Other imaging findings of potential clinical significance: Mild bilateral renal atrophy. Lower lumbar impingement due to spondylosis and degenerative disc disease. 3. Aortic atherosclerosis. Aortic Atherosclerosis (ICD10-I70.0). Electronically Signed   By: Van Clines M.D.   On: 05/24/2020 13:20    Procedures Procedures (including critical care time)  Medications Ordered in ED Medications  sodium chloride flush (NS) 0.9 %  injection 3 mL (has no administration in time range)  sodium chloride (PF) 0.9 % injection (has no administration in time range)  iohexol (OMNIPAQUE) 300 MG/ML solution 100 mL (100 mLs Intravenous Contrast Given 05/24/20 1249)  alum & mag hydroxide-simeth (MAALOX/MYLANTA) 200-200-20 MG/5ML suspension 30 mL (30 mLs Oral Given 05/24/20 1418)  pantoprazole (PROTONIX) injection 40 mg (40 mg Intravenous Given 05/24/20 1418)    ED Course  I have reviewed the triage vital signs and the nursing notes.  Pertinent labs & imaging results that were available during my care of the patient were reviewed by me and considered in my medical decision making (see chart for details).    MDM Rules/Calculators/A&P                           Patient is complaining predominantly of chronic low back discomfort as well as intermittent nausea symptoms and abdominal "fullness".  Given that patient is a poor historian, has significant history of abdominal surgery, and has seemingly strained memory when answering questions regarding recent bowel movements, will obtain CT of his abdomen and pelvis to assess for acute intra-abdominal pathology.  Labs CBC: Mild leukopenia to 3.1 and thrombocytopenia to 145, both consistent with baseline. CMP: CKD, mildly worsened from labs obtained 7 months ago.  No significant AKI.  No electrolyte derangement. UA:  Imaging I personally reviewed CT which demonstrates prostatomegaly, consistent with patient's report of  increased urinary frequency at night.  There is also a lobular prominence of the prostate along the right seminal vesicle of uncertain significance.  Patient is also noted to have bilateral renal atrophy and lower lumbar impingement due to spondylosis as well as degenerative disc disease which would explain his symptoms of low back pain.  On subsequent evaluation, patient was lying flat and complaining of acid reflux disease.  While he did not mention his GERD earlier, it could certainly contribute to his reported symptoms of intermittent nausea.  He reports that his primary care provider is aware but gives him "the wrong medication".  Will treat with IV pantoprazole and Maalox here in the ED and encourage him to follow up with his PCP as he is already being treated with PPIs and H2 blockers.   Patient was then evaluated by Dr. Maryan Rued and he opens up to anxiety and suggests that it could be contributing to his multiple, somatic complaints.  After long discussion, he agrees to follow-up with his primary care provider regarding his symptoms and for medical management + referral to therapist.    Will prescribe patient tamsulosin given his prostatomegaly and increased urinary frequency/hesitance.  Hopefully this will help him sleep through the night as he cites that as a primary motivating factor for coming to the ED.   All of the evaluation and work-up results were discussed with the patient and any family at bedside.  Patient and/or family were informed that while patient is appropriate for discharge at this time, some medical emergencies may only develop or become detectable after a period of time.  I specifically instructed patient and/or family to return to return to the ED or seek immediate medical attention for any new or worsening symptoms.  They were provided opportunity to ask any additional questions and have none at this time.  Prior to discharge patient is feeling well, agreeable with plan for  discharge home.  They have expressed understanding of verbal discharge instructions as well as return precautions and are agreeable to  the plan.    Final Clinical Impression(s) / ED Diagnoses Final diagnoses:  Benign prostatic hyperplasia with urinary hesitancy    Rx / DC Orders ED Discharge Orders         Ordered    tamsulosin (FLOMAX) 0.4 MG CAPS capsule  Daily     Discontinue  Reprint     05/24/20 1514           Corena Herter, PA-C 05/24/20 1525    Blanchie Dessert, MD 05/24/20 2156

## 2020-06-28 ENCOUNTER — Ambulatory Visit: Payer: 59 | Admitting: Allergy and Immunology

## 2020-06-29 ENCOUNTER — Emergency Department (HOSPITAL_COMMUNITY): Payer: 59

## 2020-06-29 ENCOUNTER — Encounter (HOSPITAL_COMMUNITY): Payer: Self-pay

## 2020-06-29 ENCOUNTER — Observation Stay (HOSPITAL_COMMUNITY)
Admission: EM | Admit: 2020-06-29 | Discharge: 2020-07-01 | Disposition: A | Discharge status: Home Health | Payer: 59 | Attending: Internal Medicine | Admitting: Internal Medicine

## 2020-06-29 ENCOUNTER — Ambulatory Visit (HOSPITAL_COMMUNITY)
Admission: EM | Admit: 2020-06-29 | Discharge: 2020-06-29 | Disposition: A | Discharge status: Home / Self Care | Payer: 59 | Attending: Urgent Care | Admitting: Urgent Care

## 2020-06-29 ENCOUNTER — Other Ambulatory Visit: Payer: Self-pay

## 2020-06-29 ENCOUNTER — Encounter (HOSPITAL_COMMUNITY): Payer: Self-pay | Admitting: *Deleted

## 2020-06-29 DIAGNOSIS — R4789 Other speech disturbances: Secondary | ICD-10-CM

## 2020-06-29 DIAGNOSIS — K219 Gastro-esophageal reflux disease without esophagitis: Secondary | ICD-10-CM | POA: Insufficient documentation

## 2020-06-29 DIAGNOSIS — E049 Nontoxic goiter, unspecified: Secondary | ICD-10-CM | POA: Diagnosis not present

## 2020-06-29 DIAGNOSIS — R4701 Aphasia: Principal | ICD-10-CM

## 2020-06-29 DIAGNOSIS — Z7982 Long term (current) use of aspirin: Secondary | ICD-10-CM | POA: Insufficient documentation

## 2020-06-29 DIAGNOSIS — E785 Hyperlipidemia, unspecified: Secondary | ICD-10-CM | POA: Insufficient documentation

## 2020-06-29 DIAGNOSIS — Z79899 Other long term (current) drug therapy: Secondary | ICD-10-CM | POA: Diagnosis not present

## 2020-06-29 DIAGNOSIS — M79602 Pain in left arm: Secondary | ICD-10-CM

## 2020-06-29 DIAGNOSIS — Z87891 Personal history of nicotine dependence: Secondary | ICD-10-CM | POA: Insufficient documentation

## 2020-06-29 DIAGNOSIS — R2990 Unspecified symptoms and signs involving the nervous system: Secondary | ICD-10-CM

## 2020-06-29 DIAGNOSIS — Z20822 Contact with and (suspected) exposure to covid-19: Secondary | ICD-10-CM | POA: Diagnosis not present

## 2020-06-29 DIAGNOSIS — R410 Disorientation, unspecified: Secondary | ICD-10-CM

## 2020-06-29 DIAGNOSIS — R0789 Other chest pain: Secondary | ICD-10-CM

## 2020-06-29 DIAGNOSIS — R531 Weakness: Secondary | ICD-10-CM

## 2020-06-29 DIAGNOSIS — R200 Anesthesia of skin: Secondary | ICD-10-CM

## 2020-06-29 DIAGNOSIS — G459 Transient cerebral ischemic attack, unspecified: Secondary | ICD-10-CM | POA: Insufficient documentation

## 2020-06-29 DIAGNOSIS — R079 Chest pain, unspecified: Secondary | ICD-10-CM

## 2020-06-29 DIAGNOSIS — E042 Nontoxic multinodular goiter: Secondary | ICD-10-CM | POA: Insufficient documentation

## 2020-06-29 DIAGNOSIS — R1312 Dysphagia, oropharyngeal phase: Secondary | ICD-10-CM | POA: Insufficient documentation

## 2020-06-29 DIAGNOSIS — I639 Cerebral infarction, unspecified: Secondary | ICD-10-CM | POA: Diagnosis not present

## 2020-06-29 LAB — COMPREHENSIVE METABOLIC PANEL
ALT: 52 U/L — ABNORMAL HIGH (ref 0–44)
AST: 25 U/L (ref 15–41)
Albumin: 3.9 g/dL (ref 3.5–5.0)
Alkaline Phosphatase: 97 U/L (ref 38–126)
Anion gap: 8 (ref 5–15)
BUN: 16 mg/dL (ref 8–23)
CO2: 28 mmol/L (ref 22–32)
Calcium: 9.2 mg/dL (ref 8.9–10.3)
Chloride: 102 mmol/L (ref 98–111)
Creatinine, Ser: 1.41 mg/dL — ABNORMAL HIGH (ref 0.61–1.24)
GFR calc Af Amer: 55 mL/min — ABNORMAL LOW (ref >60)
GFR calc non Af Amer: 47 mL/min — ABNORMAL LOW (ref >60)
Glucose, Bld: 105 mg/dL — ABNORMAL HIGH (ref 70–99)
Potassium: 4.4 mmol/L (ref 3.5–5.1)
Sodium: 138 mmol/L (ref 135–145)
Total Bilirubin: 0.9 mg/dL (ref 0.3–1.2)
Total Protein: 6.9 g/dL (ref 6.5–8.1)

## 2020-06-29 LAB — I-STAT CHEM 8, ED
BUN: 19 mg/dL (ref 8–23)
Calcium, Ion: 1.17 mmol/L (ref 1.15–1.40)
Chloride: 103 mmol/L (ref 98–111)
Creatinine, Ser: 1.5 mg/dL — ABNORMAL HIGH (ref 0.61–1.24)
Glucose, Bld: 100 mg/dL — ABNORMAL HIGH (ref 70–99)
HCT: 41 % (ref 39.0–52.0)
Hemoglobin: 13.9 g/dL (ref 13.0–17.0)
Potassium: 4.4 mmol/L (ref 3.5–5.1)
Sodium: 140 mmol/L (ref 135–145)
TCO2: 27 mmol/L (ref 22–32)

## 2020-06-29 LAB — APTT: aPTT: 31 seconds (ref 24–36)

## 2020-06-29 LAB — PROTIME-INR
INR: 1 (ref 0.8–1.2)
Prothrombin Time: 12.7 seconds (ref 11.4–15.2)

## 2020-06-29 LAB — DIFFERENTIAL
Abs Immature Granulocytes: 0 10*3/uL (ref 0.00–0.07)
Basophils Absolute: 0 10*3/uL (ref 0.0–0.1)
Basophils Relative: 0 %
Eosinophils Absolute: 0.1 10*3/uL (ref 0.0–0.5)
Eosinophils Relative: 1 %
Immature Granulocytes: 0 %
Lymphocytes Relative: 30 %
Lymphs Abs: 1.4 10*3/uL (ref 0.7–4.0)
Monocytes Absolute: 0.4 10*3/uL (ref 0.1–1.0)
Monocytes Relative: 9 %
Neutro Abs: 2.7 10*3/uL (ref 1.7–7.7)
Neutrophils Relative %: 60 %

## 2020-06-29 LAB — CBC
HCT: 40.6 % (ref 39.0–52.0)
Hemoglobin: 13.2 g/dL (ref 13.0–17.0)
MCH: 31.6 pg (ref 26.0–34.0)
MCHC: 32.5 g/dL (ref 30.0–36.0)
MCV: 97.1 fL (ref 80.0–100.0)
Platelets: 147 10*3/uL — ABNORMAL LOW (ref 150–400)
RBC: 4.18 MIL/uL — ABNORMAL LOW (ref 4.22–5.81)
RDW: 12.2 % (ref 11.5–15.5)
WBC: 4.5 10*3/uL (ref 4.0–10.5)
nRBC: 0 % (ref 0.0–0.2)

## 2020-06-29 LAB — TROPONIN I (HIGH SENSITIVITY): Troponin I (High Sensitivity): 6 ng/L (ref <18)

## 2020-06-29 MED ORDER — ACETAMINOPHEN 160 MG/5ML PO SOLN: 650.0000 mg | ORAL | Status: DC | PRN

## 2020-06-29 MED ORDER — ACETAMINOPHEN 650 MG RE SUPP: 650.0000 mg | RECTAL | Status: DC | PRN

## 2020-06-29 MED ORDER — STROKE: EARLY STAGES OF RECOVERY BOOK
Freq: Once | Status: AC
  Filled 2020-06-29 (×2): qty 1

## 2020-06-29 MED ORDER — ENOXAPARIN SODIUM 40 MG/0.4ML ~~LOC~~ SOLN
40.0000 mg | Freq: Every day | SUBCUTANEOUS | Status: DC
  Administered 2020-06-30 (×2): 40 mg via SUBCUTANEOUS
  Filled 2020-06-29 (×2): qty 0.4

## 2020-06-29 MED ORDER — ASPIRIN 81 MG PO CHEW: 81.0000 mg | CHEWABLE_TABLET | Freq: Every day | ORAL | Status: DC

## 2020-06-29 MED ORDER — ACETAMINOPHEN 325 MG PO TABS
650.0000 mg | ORAL_TABLET | ORAL | Status: DC | PRN
  Administered 2020-07-01: 650 mg via ORAL

## 2020-06-29 MED ORDER — ASPIRIN 300 MG RE SUPP: 300.0000 mg | Freq: Every day | RECTAL | Status: DC

## 2020-06-29 MED ORDER — ASPIRIN 325 MG PO TABS
325.0000 mg | ORAL_TABLET | Freq: Every day | ORAL | Status: DC
  Administered 2020-06-30 – 2020-07-01 (×2): 325 mg via ORAL
  Filled 2020-06-29 (×2): qty 1

## 2020-06-29 MED ORDER — SODIUM CHLORIDE 0.9 % IV SOLN: INTRAVENOUS | Status: DC

## 2020-06-29 MED ORDER — SODIUM CHLORIDE 0.9% FLUSH
3.0000 mL | Freq: Once | INTRAVENOUS | Status: AC
Start: 2020-06-29 — End: 2020-07-01
  Administered 2020-07-01: 3 mL via INTRAVENOUS

## 2020-06-29 NOTE — ED Notes (Signed)
Patient is being discharged from the Urgent Care and sent to the Emergency Department via wheelchair . Per mani, pa, patient is in need of higher level of care due to arm pain, chest pain. Patient is aware and verbalizes understanding of plan of care.  Vitals:   06/29/20 0854  BP: (!) 158/75  Pulse: 90  Resp: 20  Temp: 98.2 F (36.8 C)

## 2020-06-29 NOTE — H&P (Addendum)
History and Physical    Randy Merritt IFO:[id number] DOB: November 03, 1941 DOA: 06/29/2020  PCP: Shirline Frees, MD  Patient coming from: Home.  Chief Complaint: Memory difficulties and bringing out words.  HPI: Randy Merritt is a 79 y.o. male with history of goiter, acid reflux requiring previous esophageal dilatation, hyperlipidemia BPH had gone to the urgent care with complaints of chest pain and was found to be confused and had difficulty bringing out words.  Patient states he has been having confusion and difficulty bringing up words for most a month.  Attributes to the injection he received from primary care's office.  We do not have records of this.  We do not know why he received the medication and also patient not able to remember it.  At the time of my exam patient denies any chest pain shortness of breath.  He is finding it difficult to talk and remember things and chronological orders.  ED Course: In the ER patient had MRI of the brain which did not show anything acute neurology on-call was consulted.  At this time neurology recommended further work-up including MRI brain with and without contrast and admission for further observation.  Patient did not pass swallow since he had difficulty swallowing from the goiter.  Labs are largely unremarkable except for creatinine of 1.4 which appears to be at baseline when compared to last month and platelets of 145 comparable to last month.  High sensory troponin was 6 EKG was showing normal sinus rhythm.  Review of Systems: As per HPI, rest all negative.   Past Medical History:  Diagnosis Date  . Acid reflux   . Anxiety   . Arthritis   . Depression    " a little"  . Goiter   . High cholesterol   . History of blood transfusion   . Perforated bowel ([place])   . Seasonal allergies     Past Surgical History:  Procedure Laterality Date  . ANTERIOR CERVICAL DECOMP/DISCECTOMY FUSION N/A 04/08/2013   Procedure: ANTERIOR CERVICAL  DECOMPRESSION/DISCECTOMY FUSION 1 LEVEL;  Surgeon: Floyce Stakes, MD;  Location: [facility];  Service: Neurosurgery;  Laterality: N/A;  Cervical five-six Anterior cervical decompression/diskectomy fusion  . APPENDECTOMY N/A 10/06/2019   Procedure: APPENDECTOMY;  Surgeon: Clovis Riley, MD;  Location: [place];  Service: General;  Laterality: N/A;  . BIOPSY  01/30/2019   Procedure: BIOPSY;  Surgeon: Carol Ada, MD;  Location: [facility];  Service: Endoscopy;;  . CERVICAL DISCECTOMY     x2  . CHOLECYSTECTOMY    . COLON RESECTION SIGMOID N/A 10/06/2019   Procedure: COLON RESECTION SIGMOID;  Surgeon: Clovis Riley, MD;  Location: [place];  Service: General;  Laterality: N/A;  . ESOPHAGEAL DILATION  01/30/2019   Procedure: ESOPHAGEAL DILATION;  Surgeon: Carol Ada, MD;  Location: [facility];  Service: Endoscopy;;  [place]  . ESOPHAGOGASTRODUODENOSCOPY (EGD) WITH PROPOFOL N/A 01/30/2019   Procedure: ESOPHAGOGASTRODUODENOSCOPY (EGD) WITH PROPOFOL;  Surgeon: Carol Ada, MD;  [facility];  Service: Endoscopy;  Laterality: N/A;  . gallstone removal    . LAPAROTOMY N/A 10/06/2019   Procedure: EXPLORATORY LAPAROTOMY;  Surgeon: Clovis Riley, MD;  Location: [place];  Service: General;  Laterality: N/A;     reports that he has quit smoking. He has never used smokeless tobacco. He reports that he does not drink alcohol and does not use drugs.  No Known Allergies  Family History  Problem Relation Age of Onset  . Healthy Mother   .  Healthy Father   . Thyroid disease Neg Hx     Prior to Admission medications   Medication Sig Start Date End Date Taking? Authorizing Provider  acetaminophen (TYLENOL) 500 MG tablet Take 2 tablets (1,000 mg total) by mouth every 8 (eight) hours. 10/15/19  Yes Earnstine Regal, PA-C  alum & mag hydroxide-simeth (MAALOX/MYLANTA) 200-200-20 MG/5ML suspension Take 15 mLs by mouth every 6 (six) hours as needed for indigestion or heartburn. 06/19/19   Yes Wieters, Hallie C, PA-C  aspirin EC 81 MG tablet Take 1 tablet (81 mg total) by mouth daily. 10/15/19  Yes Earnstine Regal, PA-C  famotidine (PEPCID) 40 MG tablet 40 mg in the evening 04/05/20  Yes Kozlow, Donnamarie Poag, MD  fluticasone (FLONASE) 50 MCG/ACT nasal spray 1 spray each nostril 1 time per day Patient taking differently: Place 1 spray into both nostrils daily.  04/05/20  Yes Kozlow, Donnamarie Poag, MD  loratadine (CLARITIN) 10 MG tablet 1-2 tablets 1 time per day Patient taking differently: Take 10 mg by mouth daily as needed for allergies.  04/05/20  Yes Kozlow, Donnamarie Poag, MD  montelukast (SINGULAIR) 10 MG tablet 1 tablet 1 time per day Patient taking differently: Take 10 mg by mouth daily.  04/05/20  Yes Kozlow, Donnamarie Poag, MD  Multiple Vitamins-Iron (MULTIVITAMINS WITH IRON) TABS tablet You should get a Women's One a day vitamin with iron and take daily.  This will help with your low hemoglobin.  You can buy this at any drug store, and choose whatever brand you like. Patient taking differently: Take 1 tablet by mouth daily.  10/15/19  Yes Earnstine Regal, PA-C  oxyCODONE (OXY IR/ROXICODONE) 5 MG immediate release tablet Take 1 tablet (5 mg total) by mouth every 6 (six) hours as needed for severe pain or breakthrough pain. 10/15/19  Yes Earnstine Regal, PA-C  pantoprazole (PROTONIX) 40 MG tablet Take 1 tablet (40 mg total) by mouth 2 (two) times daily. 04/05/20  Yes Kozlow, Donnamarie Poag, MD  rosuvastatin (CRESTOR) 10 MG tablet Take 10 mg by mouth daily. 03/01/20  Yes [provider]    Physical Exam: Constitutional: Moderately built and nourished. Vitals:   06/29/20 2200 06/29/20 2230 06/29/20 2300 06/29/20 2330  BP: (!) 149/91 (!) 142/93 (!) 139/104 (!) 158/92  Pulse: 80   77  Resp: 16 (!) 24 (!) 23 16  Temp:      TempSrc:      SpO2: 98%   98%   Eyes: Anicteric no pallor. ENMT: No discharge from the ears eyes nose or mouth. Neck: Goiter seen. Respiratory: No rhonchi or  crepitations. Cardiovascular: S1-S2 heard. Abdomen: Soft nontender bowel sounds present. Musculoskeletal: No edema. Skin: No rash. Neurologic: Alert awake oriented to his name and place.  Moves all extremities 5 x 5. Psychiatric: Has difficulty remembering things.   Labs on Admission: I have personally reviewed following labs and imaging studies  CBC: Recent Labs  Lab 06/29/20 1052 06/29/20 1100  WBC 4.5  --   NEUTROABS 2.7  --   HGB 13.2 13.9  HCT 40.6 41.0  MCV 97.1  --   PLT 147*  --    Basic Metabolic Panel: Recent Labs  Lab 06/29/20 1052 06/29/20 1100  NA 138 140  K 4.4 4.4  CL 102 103  CO2 28  --   GLUCOSE 105* 100*  BUN 16 19  CREATININE 1.41* 1.50*  CALCIUM 9.2  --    GFR: CrCl cannot be calculated (Unknown ideal weight.). Liver Function Tests: Recent  Labs  Lab 06/29/20 1052  AST 25  ALT 52*  ALKPHOS 97  BILITOT 0.9  PROT 6.9  ALBUMIN 3.9   No results for input(s): LIPASE, AMYLASE in the last 168 hours. No results for input(s): AMMONIA in the last 168 hours. Coagulation Profile: Recent Labs  Lab 06/29/20 1052  INR 1.0   Cardiac Enzymes: No results for input(s): CKTOTAL, CKMB, CKMBINDEX, TROPONINI in the last 168 hours. BNP (last 3 results) No results for input(s): PROBNP in the last 8760 hours. HbA1C: No results for input(s): HGBA1C in the last 72 hours. CBG: No results for input(s): GLUCAP in the last 168 hours. Lipid Profile: No results for input(s): CHOL, HDL, LDLCALC, TRIG, CHOLHDL, LDLDIRECT in the last 72 hours. Thyroid Function Tests: No results for input(s): TSH, T4TOTAL, FREET4, T3FREE, THYROIDAB in the last 72 hours. Anemia Panel: No results for input(s): VITAMINB12, FOLATE, FERRITIN, TIBC, IRON, RETICCTPCT in the last 72 hours. Urine analysis:    Component Value Date/Time   COLORURINE YELLOW 05/24/2020 1419   APPEARANCEUR CLEAR 05/24/2020 1419   LABSPEC >1.046 (H) 05/24/2020 1419   PHURINE 7.0 05/24/2020 1419   GLUCOSEU  NEGATIVE 05/24/2020 1419   HGBUR NEGATIVE 05/24/2020 1419   BILIRUBINUR NEGATIVE 05/24/2020 1419   KETONESUR NEGATIVE 05/24/2020 1419   PROTEINUR NEGATIVE 05/24/2020 1419   UROBILINOGEN 1.0 05/16/2014 1857   NITRITE NEGATIVE 05/24/2020 1419   LEUKOCYTESUR NEGATIVE 05/24/2020 1419   Sepsis Labs: @LABRCNTIP (procalcitonin:4,lacticidven:4) )No results found for this or any previous visit (from the past 240 hour(s)).   Radiological Exams on Admission: CT HEAD WO CONTRAST  Result Date: 06/29/2020 CLINICAL DATA:  Neuro deficit, acute, stroke suspected. Additional provided: Patient reports feeling confused, difficulty with words, symptoms for several days. EXAM: CT HEAD WITHOUT CONTRAST TECHNIQUE: Contiguous axial images were obtained from the base of the skull through the vertex without intravenous contrast. COMPARISON:  Head CT 06/25/2007 FINDINGS: Brain: Mild-to-moderate generalized parenchymal atrophy. Mild-to-moderate ill-defined hypoattenuation within the cerebral white matter is nonspecific, but consistent with chronic small vessel ischemic disease. These findings have progressed as compared to the head CT of 06/25/2007. A small lacunar infarct is questioned within the left lentiform nucleus, new from the prior examination but otherwise age indeterminate (series 3, image 15). There is no acute intracranial hemorrhage. No demarcated cortical infarct is identified. No extra-axial fluid collection. No evidence of intracranial mass. No midline shift. Vascular: No hyperdense vessel.  Atherosclerotic calcifications Skull: Normal. Negative for fracture or focal lesion. Sinuses/Orbits: Visualized orbits show no acute finding. Mild ethmoid sinus mucosal thickening. No significant mastoid effusion. IMPRESSION: A small lacunar infarct is questioned within the left basal ganglia, new from the prior head CT of 06/25/2007 but otherwise age-indeterminate. Progressive background mild to moderate generalized  parenchymal atrophy and chronic small vessel ischemic disease. No acute intracranial hemorrhage or acute demarcated cortical infarct is identified. Mild ethmoid sinus mucosal thickening. Electronically Signed   By: Kellie Simmering DO   On: 06/29/2020 12:34   MR BRAIN WO CONTRAST  Result Date: 06/29/2020 CLINICAL DATA:  Speech difficulties. EXAM: MRI HEAD WITHOUT CONTRAST TECHNIQUE: Multiplanar, multiecho pulse sequences of the brain and surrounding structures were obtained without intravenous contrast. COMPARISON:  Head CT 06/29/2020 FINDINGS: Brain: There is no evidence of an acute infarct, intracranial hemorrhage, mass, midline shift, or extra-axial fluid collection. There is a chronic lacunar infarct at the posterior aspect of the right lentiform nucleus. There is a small chronic cortical infarct in the high right frontal lobe. Patchy T2 hyperintensities in  the cerebral white matter bilaterally are nonspecific but compatible with moderate chronic small vessel ischemic disease. Mild chronic small vessel changes are noted in the pons. There is mild cerebral atrophy. Vascular: Major intracranial vascular flow voids are preserved. Skull and upper cervical spine: Unremarkable bone marrow signal. Anterior fusion at C3-4. Sinuses/Orbits: Unremarkable orbits. Paranasal sinuses and mastoid air cells are clear. Other: None. IMPRESSION: 1. No acute intracranial abnormality. 2. Moderate chronic small vessel ischemic disease. 3. 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 Electronically Signed   By: Logan Bores M.D.   On: 06/29/2020 17:34    EKG: Independently reviewed.  Normal sinus rhythm.  Assessment/Plan Principal Problem:   TIA (transient ischemic attack) Active Problems:   Oropharyngeal dysphagia   Goiter    1. Confusion and difficulty bringing out words cause not clear -neurologic consult appreciated.  MRI brain with contrast has been ordered.  Will await further recommendation per  neurology. 2. Difficulty swallowing with history of goiter and oropharyngeal dysphagia previously requiring esophageal dilatation and also was on PPI with history of reflux.  Presently kept n.p.o. we will get speech therapy evaluation.  May need GI input if patient has difficulty swallowing.  We will keep patient on PPI. 3. History of hyperlipidemia presently n.p.o. 4. History of BPH. 5. Patient complained of chest pain in the urgent care.  To me patient does not complain of any chest pain.  High sensitive troponin was negative EKG unremarkable.  Will get a chest x-ray.  Will trend cardiac markers.   DVT prophylaxis: Lovenox. Code Status: Full code. Family Communication: Discussed with patient. Disposition Plan: Home. Consults called: Neurology.  Speech therapist. Admission status: Observation.   Rise Patience MD [facility] Pager [phone].  If 7PM-7AM, please contact night-coverage [email or web address] Password [facility]  06/29/2020, 11:43 PM

## 2020-06-29 NOTE — Discharge Instructions (Signed)
You are in need of a higher level of care than we can provide in the urgent care setting.  You have very concerning symptoms of changes in your speech, confusion, weakness and need a head CT.  We have done an EKG for you and it did not show any sign of heart attack now but your chest pain will be needing further evaluation as well.  Our nursing staff will transport you to the emergency room now.

## 2020-06-29 NOTE — ED Provider Notes (Signed)
[place]   MRN: [id number] DOB: [birth year]  Subjective:   Randy Merritt is a 79 y.o. male presenting for 1 week history of persistent atypical chest pain, left arm pain, bilateral lower leg numbness and burning. In the past few days has started to have difficulty with his speech. States that he feels confused and cannot find his words. Feels like his symptoms started after he had an injection for low back pain. He did have a recent lab work-up on 05/24/2020, had unremarkable labs. 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  No current facility-administered medications for this encounter.  Current Outpatient Medications:    acetaminophen (TYLENOL) 500 MG tablet, Take 2 tablets (1,000 mg total) by mouth every 8 (eight) hours., Disp: 30 tablet, Rfl: 0   alum & mag hydroxide-simeth (MAALOX/MYLANTA) 200-200-20 MG/5ML suspension, Take 15 mLs by mouth every 6 (six) hours as needed for indigestion or heartburn., Disp: 355 mL, Rfl: 0   aspirin EC 81 MG tablet, Take 1 tablet (81 mg total) by mouth daily., Disp:  , Rfl:    famotidine (PEPCID) 40 MG tablet, 40 mg in the evening, Disp: 30 tablet, Rfl: 5   fluticasone (FLONASE) 50 MCG/ACT nasal spray, 1 spray each nostril 1 time per day, Disp: 16 g, Rfl: 5   loratadine (CLARITIN) 10 MG tablet, 1-2 tablets 1 time per day, Disp: 60 tablet, Rfl: 5   montelukast (SINGULAIR) 10 MG tablet, 1 tablet 1 time per day, Disp: 30 tablet, Rfl: 5   Multiple Vitamins-Iron (MULTIVITAMINS WITH IRON) TABS tablet, You should get a Women's One a day vitamin with iron and take daily.  This will help with your low hemoglobin.  You can buy this at any drug store, and choose whatever brand you like., Disp: , Rfl: 0   oxyCODONE (OXY IR/ROXICODONE) 5 MG immediate release tablet, Take 1 tablet (5 mg total) by mouth every 6 (six) hours as needed for severe pain or breakthrough pain., Disp: 20 tablet, Rfl:  0   pantoprazole (PROTONIX) 40 MG tablet, Take 1 tablet (40 mg total) by mouth 2 (two) times daily., Disp: 60 tablet, Rfl: 5   rosuvastatin (CRESTOR) 10 MG tablet, Take 10 mg by mouth daily., Disp: , Rfl:    No Known Allergies  Past Medical History:  Diagnosis Date   Acid reflux    Anxiety    Arthritis    Depression    " a little"   Goiter    High cholesterol    History of blood transfusion    Perforated bowel ([facility])    Seasonal allergies      Past Surgical History:  Procedure Laterality Date   ANTERIOR CERVICAL DECOMP/DISCECTOMY FUSION N/A 04/08/2013   Procedure: ANTERIOR CERVICAL DECOMPRESSION/DISCECTOMY FUSION 1 LEVEL;  Surgeon: Floyce Stakes, MD;  Location: [facility];  Service: Neurosurgery;  Laterality: N/A;  Cervical five-six Anterior cervical decompression/diskectomy fusion   APPENDECTOMY N/A 10/06/2019   Procedure: APPENDECTOMY;  Surgeon: Clovis Riley, MD;  Location: [place];  Service: General;  Laterality: N/A;   BIOPSY  01/30/2019   Procedure: BIOPSY;  Surgeon: Carol Ada, MD;  Location: [facility];  Service: Endoscopy;;   CERVICAL DISCECTOMY     x2   CHOLECYSTECTOMY     COLON RESECTION SIGMOID N/A 10/06/2019   Procedure: COLON RESECTION SIGMOID;  Surgeon: Clovis Riley, MD;  Location: [place];  Service: General;  Laterality: N/A;   ESOPHAGEAL DILATION  01/30/2019  Procedure: ESOPHAGEAL DILATION;  Surgeon: Carol Ada, MD;  Location: [facility];  Service: Endoscopy;;  [place]   ESOPHAGOGASTRODUODENOSCOPY (EGD) WITH PROPOFOL N/A 01/30/2019   Procedure: ESOPHAGOGASTRODUODENOSCOPY (EGD) WITH PROPOFOL;  Surgeon: Carol Ada, MD;  [facility];  Service: Endoscopy;  Laterality: N/A;   gallstone removal     LAPAROTOMY N/A 10/06/2019   Procedure: EXPLORATORY LAPAROTOMY;  Surgeon: Clovis Riley, MD;  [facility];  Service: General;  Laterality: N/A;    Family History  Problem Relation Age of Onset   Healthy Mother     Healthy Father    Thyroid disease Neg Hx     Social History   Tobacco Use   Smoking status: Former Smoker   Smokeless tobacco: Never Used  Scientific laboratory technician Use: Never used  Substance Use Topics   Alcohol use: No   Drug use: No    ROS   Objective:   Vitals: BP (!) 158/75 (BP Location: Right Arm)    Pulse 90    Temp 98.2 F (36.8 C) (Oral)    Resp 20   Physical Exam Constitutional:      General: He is not in acute distress.    Appearance: Normal appearance. He is well-developed. He is not ill-appearing, toxic-appearing or diaphoretic.  HENT:     Head: Normocephalic and atraumatic.     Right Ear: External ear normal.     Left Ear: External ear normal.     Nose: Nose normal.     Mouth/Throat:     Mouth: Mucous membranes are moist.     Pharynx: Oropharynx is clear.  Eyes:     General: No scleral icterus.    Extraocular Movements: Extraocular movements intact.     Pupils: Pupils are equal, round, and reactive to light.  Cardiovascular:     Rate and Rhythm: Normal rate and regular rhythm.     Heart sounds: Normal heart sounds. No murmur heard.  No friction rub. No gallop.   Pulmonary:     Effort: Pulmonary effort is normal. No respiratory distress.     Breath sounds: Normal breath sounds. No stridor. No wheezing, rhonchi or rales.  Neurological:     Mental Status: He is alert and oriented to person, place, and time. He is confused.     Cranial Nerves: Cranial nerve deficit present.     Motor: Weakness present.     Coordination: Romberg sign negative. Coordination normal.     Deep Tendon Reflexes: Reflexes are normal and symmetric.     Comments: Patient has substantial difficulty with his speech, specifically finding his words. He is unable to follow commands for neurologic exam. States that he feels really confused about what is happening to him.  Psychiatric:        Mood and Affect: Mood normal.        Behavior: Behavior normal.        Thought Content:  Thought content normal.     ED ECG REPORT   Date: 06/29/2020  Rate: 85bpm  Rhythm: normal sinus rhythm  QRS Axis: normal  Intervals: normal  ST/T Wave abnormalities: T wave flattening in lead I, aVL  Conduction Disutrbances:none  Narrative Interpretation: Sinus rhythm at 85 bpm with nonspecific T wave flattening in lead I, aVL but otherwise very comparable to previous EKGs.  Old EKG Reviewed: unchanged  I have personally reviewed the EKG tracing and agree with the computerized printout as noted.   Assessment and Plan :   PDMP  not reviewed this encounter.  1. Episode of change in speech   2. Confusion   3. Weakness   4. Left arm pain   5. Numbness and tingling of both feet   6. Atypical chest pain     Patient has concerning neurologic symptoms and exam findings. Emphasized need to get head CT through the emergency room. EKG does not show signs of ACS. Recent lab work and CT abdomen pelvis reviewed. Given timeframe of 1 week of neurologic symptoms discussed with patient getting to the emergency room without EMS, our nursing staff will transport him there. Patient was in agreement.   Jaynee Eagles, PA-C 06/29/20 1019

## 2020-06-29 NOTE — ED Triage Notes (Signed)
Pt sent here from urgent care for further workup and need for head CT.  Pt is having a hard time remembering and getting out words.  Pt drove himself to the hospital.  Pt follows commands.  Spoke with wife on phone and states symptoms have been going on for a few days.

## 2020-06-29 NOTE — ED Triage Notes (Signed)
Pt presents with chest discomfort, left arm pain and bilateral leg pain x 1 week. Pt states his chest "feels funny" when walking, left arm pain "all the time" and burning sensation in the legs. States all the symptoms started after he had an injection and cyclobenzaprine for lower back pain.

## 2020-06-29 NOTE — ED Notes (Signed)
Unable to reach wife

## 2020-06-29 NOTE — ED Provider Notes (Signed)
[facility] EMERGENCY DEPARTMENT Provider Note   CSN: [id number] Arrival date & time: 06/29/20  1009     History Chief Complaint  Patient presents with   Stroke Symptoms    Randy Merritt is a 79 y.o. male.  Level 5 caveat limited due to confusion.  Additional history obtained from wife.  Patient reports over the past week or so he has noted some difficulty with word finding and feels somewhat confused at times.  He denies any associated numbness, weakness, vision changes.  He denied any chest pain today or recently when interviewing although the urgent care note reported patient had some vague left-sided chest pain.  Per the wife, she thinks this has been going on primarily for the past 3 days.  Worse on Monday, slightly improved on Tuesday and then worse today.  HPI     Past Medical History:  Diagnosis Date   Acid reflux    Anxiety    Arthritis    Depression    " a little"   Goiter    High cholesterol    History of blood transfusion    Perforated bowel (HCC)    Seasonal allergies     Patient Active Problem List   Diagnosis Date Noted   TIA (transient ischemic attack) 06/29/2020   Goiter 12/24/2019   Perforated sigmoid colon ([place]) 10/06/2019   Ingrown toenail 10/11/2017   Chondrodermatitis nodularis helicis of right ear 00/17/4944   Oropharyngeal dysphagia 02/02/2017   Chronic rhinitis 02/01/2017   Gastroesophageal reflux disease 02/01/2017    Past Surgical History:  Procedure Laterality Date   ANTERIOR CERVICAL DECOMP/DISCECTOMY FUSION N/A 04/08/2013   Procedure: ANTERIOR CERVICAL DECOMPRESSION/DISCECTOMY FUSION 1 LEVEL;  Surgeon: Floyce Stakes, MD;  Location: [facility];  Service: Neurosurgery;  Laterality: N/A;  Cervical five-six Anterior cervical decompression/diskectomy fusion   APPENDECTOMY N/A 10/06/2019   Procedure: APPENDECTOMY;  Surgeon: Clovis Riley, MD;  Location: [place];  Service: General;  Laterality:  N/A;   BIOPSY  01/30/2019   Procedure: BIOPSY;  Surgeon: Carol Ada, MD;  Location: [facility];  Service: Endoscopy;;   CERVICAL DISCECTOMY     x2   CHOLECYSTECTOMY     COLON RESECTION SIGMOID N/A 10/06/2019   Procedure: COLON RESECTION SIGMOID;  Surgeon: Clovis Riley, MD;  Location: [place];  Service: General;  Laterality: N/A;   ESOPHAGEAL DILATION  01/30/2019   Procedure: ESOPHAGEAL DILATION;  Surgeon: Carol Ada, MD;  Location: [facility];  Service: Endoscopy;;  [place]   ESOPHAGOGASTRODUODENOSCOPY (EGD) WITH PROPOFOL N/A 01/30/2019   Procedure: ESOPHAGOGASTRODUODENOSCOPY (EGD) WITH PROPOFOL;  Surgeon: Carol Ada, MD;  [facility]: [facility];  Service: Endoscopy;  Laterality: N/A;   gallstone removal     LAPAROTOMY N/A 10/06/2019   Procedure: EXPLORATORY LAPAROTOMY;  Surgeon: Clovis Riley, MD;  Location: [facility];  Service: General;  Laterality: N/A;       Family History  Problem Relation Age of Onset   Healthy Mother    Healthy Father    Thyroid disease Neg Hx     Social History   Tobacco Use   Smoking status: Former Smoker   Smokeless tobacco: Never Used  Scientific laboratory technician Use: Never used  Substance Use Topics   Alcohol use: No   Drug use: No    Home Medications Prior to Admission medications   Medication Sig Start Date End Date Taking? Authorizing Provider  acetaminophen (TYLENOL) 500 MG tablet Take 2 tablets (1,000 mg total) by  mouth every 8 (eight) hours. 10/15/19  Yes Earnstine Regal, PA-C  alum & mag hydroxide-simeth (MAALOX/MYLANTA) 200-200-20 MG/5ML suspension Take 15 mLs by mouth every 6 (six) hours as needed for indigestion or heartburn. 06/19/19  Yes Wieters, Hallie C, PA-C  aspirin EC 81 MG tablet Take 1 tablet (81 mg total) by mouth daily. 10/15/19  Yes Earnstine Regal, PA-C  famotidine (PEPCID) 40 MG tablet 40 mg in the evening 04/05/20  Yes Kozlow, Donnamarie Poag, MD  fluticasone (FLONASE) 50 MCG/ACT nasal spray 1 spray each  nostril 1 time per day Patient taking differently: Place 1 spray into both nostrils daily.  04/05/20  Yes Kozlow, Donnamarie Poag, MD  loratadine (CLARITIN) 10 MG tablet 1-2 tablets 1 time per day Patient taking differently: Take 10 mg by mouth daily as needed for allergies.  04/05/20  Yes Kozlow, Donnamarie Poag, MD  montelukast (SINGULAIR) 10 MG tablet 1 tablet 1 time per day Patient taking differently: Take 10 mg by mouth daily.  04/05/20  Yes Kozlow, Donnamarie Poag, MD  Multiple Vitamins-Iron (MULTIVITAMINS WITH IRON) TABS tablet You should get a Women's One a day vitamin with iron and take daily.  This will help with your low hemoglobin.  You can buy this at any drug store, and choose whatever brand you like. Patient taking differently: Take 1 tablet by mouth daily.  10/15/19  Yes Earnstine Regal, PA-C  oxyCODONE (OXY IR/ROXICODONE) 5 MG immediate release tablet Take 1 tablet (5 mg total) by mouth every 6 (six) hours as needed for severe pain or breakthrough pain. 10/15/19  Yes Earnstine Regal, PA-C  pantoprazole (PROTONIX) 40 MG tablet Take 1 tablet (40 mg total) by mouth 2 (two) times daily. 04/05/20  Yes Kozlow, Donnamarie Poag, MD  rosuvastatin (CRESTOR) 10 MG tablet Take 10 mg by mouth daily. 03/01/20  Yes [provider]    Allergies    Patient has no known allergies.  Review of Systems   Review of Systems  Physical Exam Updated Vital Signs BP (!) 158/92    Pulse 80    Temp 98.1 F (36.7 C) (Oral)    Resp 16    SpO2 98%   Physical Exam Vitals and nursing note reviewed.  Constitutional:      Appearance: He is well-developed.  HENT:     Head: Normocephalic and atraumatic.  Eyes:     Conjunctiva/sclera: Conjunctivae normal.  Cardiovascular:     Rate and Rhythm: Normal rate and regular rhythm.     Heart sounds: No murmur heard.   Pulmonary:     Effort: Pulmonary effort is normal. No respiratory distress.     Breath sounds: Normal breath sounds.  Abdominal:     Palpations: Abdomen is soft.      Tenderness: There is no abdominal tenderness.  Musculoskeletal:     Cervical back: Neck supple.  Skin:    General: Skin is warm and dry.  Neurological:     Mental Status: He is alert.     Comments: AAOx3 but mild confusion, expressive aphasia CN 2-12 intact, visual fields intact 5/5 strength in b/l UE and LE Sensation to light touch intact in b/l UE and LE Normal FNF      ED Results / Procedures / Treatments   Labs (all labs ordered are listed, but only abnormal results are displayed) Labs Reviewed  CBC - Abnormal; Notable for the following components:      Result Value   RBC 4.18 (*)    Platelets 147 (*)  All other components within normal limits  COMPREHENSIVE METABOLIC PANEL - Abnormal; Notable for the following components:   Glucose, Bld 105 (*)    Creatinine, Ser 1.41 (*)    ALT 52 (*)    GFR calc non Af Amer 47 (*)    GFR calc Af Amer 55 (*)    All other components within normal limits  I-STAT CHEM 8, ED - Abnormal; Notable for the following components:   Creatinine, Ser 1.50 (*)    Glucose, Bld 100 (*)    All other components within normal limits  SARS CORONAVIRUS 2 BY RT PCR (HOSPITAL ORDER, [place] [facility])  PROTIME-INR  APTT  DIFFERENTIAL  URINALYSIS, ROUTINE W REFLEX MICROSCOPIC  CBG MONITORING, ED  TROPONIN I (HIGH SENSITIVITY)    EKG EKG Interpretation  Date/Time:  Wednesday June 29 2020 10:38:07 EDT Ventricular Rate:  77 PR Interval:  140 QRS Duration: 72 QT Interval:  354 QTC Calculation: 400 R Axis:   73 Text Interpretation: Normal sinus rhythm Normal ECG When compared to prior, no significnat cahnges seen. No sTEMI Confirmed by Antony Blackbird [phone]) on 06/29/2020 3:38:09 PM   Radiology CT HEAD WO CONTRAST  Result Date: 06/29/2020 CLINICAL DATA:  Neuro deficit, acute, stroke suspected. Additional provided: Patient reports feeling confused, difficulty with words, symptoms for several days. EXAM: CT HEAD WITHOUT  CONTRAST TECHNIQUE: Contiguous axial images were obtained from the base of the skull through the vertex without intravenous contrast. COMPARISON:  Head CT 06/25/2007 FINDINGS: Brain: Mild-to-moderate generalized parenchymal atrophy. Mild-to-moderate ill-defined hypoattenuation within the cerebral white matter is nonspecific, but consistent with chronic small vessel ischemic disease. These findings have progressed as compared to the head CT of 06/25/2007. A small lacunar infarct is questioned within the left lentiform nucleus, new from the prior examination but otherwise age indeterminate (series 3, image 15). There is no acute intracranial hemorrhage. No demarcated cortical infarct is identified. No extra-axial fluid collection. No evidence of intracranial mass. No midline shift. Vascular: No hyperdense vessel.  Atherosclerotic calcifications Skull: Normal. Negative for fracture or focal lesion. Sinuses/Orbits: Visualized orbits show no acute finding. Mild ethmoid sinus mucosal thickening. No significant mastoid effusion. IMPRESSION: A small lacunar infarct is questioned within the left basal ganglia, new from the prior head CT of 06/25/2007 but otherwise age-indeterminate. Progressive background mild to moderate generalized parenchymal atrophy and chronic small vessel ischemic disease. No acute intracranial hemorrhage or acute demarcated cortical infarct is identified. Mild ethmoid sinus mucosal thickening. Electronically Signed   By: Kellie Simmering DO   On: 06/29/2020 12:34   MR BRAIN WO CONTRAST  Result Date: 06/29/2020 CLINICAL DATA:  Speech difficulties. EXAM: MRI HEAD WITHOUT CONTRAST TECHNIQUE: Multiplanar, multiecho pulse sequences of the brain and surrounding structures were obtained without intravenous contrast. COMPARISON:  Head CT 06/29/2020 FINDINGS: Brain: There is no evidence of an acute infarct, intracranial hemorrhage, mass, midline shift, or extra-axial fluid collection. There is a chronic  lacunar infarct at the posterior aspect of the right lentiform nucleus. There is a small chronic cortical infarct in the high right frontal lobe. Patchy T2 hyperintensities in the cerebral white matter bilaterally are nonspecific but compatible with moderate chronic small vessel ischemic disease. Mild chronic small vessel changes are noted in the pons. There is mild cerebral atrophy. Vascular: Major intracranial vascular flow voids are preserved. Skull and upper cervical spine: Unremarkable bone marrow signal. Anterior fusion at C3-4. Sinuses/Orbits: Unremarkable orbits. Paranasal sinuses and mastoid air cells are clear. Other: None. IMPRESSION:  1. No acute intracranial abnormality. 2. Moderate chronic small vessel ischemic disease. 3. 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 Electronically Signed   By: Logan Bores M.D.   On: 06/29/2020 17:34    Procedures Procedures (including critical care time)  Medications Ordered in ED Medications  sodium chloride flush (NS) 0.9 % injection 3 mL (has no administration in time range)  aspirin chewable tablet 81 mg (81 mg Oral Not Given 06/29/20 2230)    ED Course  I have reviewed the triage vital signs and the nursing notes.  Pertinent labs & imaging results that were available during my care of the patient were reviewed by me and considered in my medical decision making (see chart for details).  Clinical Course as of Jun 29 2333  Wed Jun 29, 2020  1814 Attempted to evaluate patient again, still not in room    [RD]  [place]   [RD]  2226 Reviewed with neurology - he recommends admission, MRI with contrast and MRAs - he will place order. Will c/s hosp for admit   [RD]  [place] will accept   [RD]    Clinical Course User Index [RD] Lucrezia Starch, MD   MDM Rules/Calculators/A&P                          79 year old male presents to ER with concern for aphasia.  On exam patient is well-appearing in no  distress.  He denies any other complaints.  MRI brain was negative for acute pathology, acute stroke.  On my exam he does demonstrate expressive aphasia.  Given this neurologic change, consulted neurology [place] who came to bedside.  Recommended medicine admission and additional MR imaging.  Dr. Hal Hope will admit.  Final Clinical Impression(s) / ED Diagnoses Final diagnoses:  Aphasia  Stroke-like symptoms    Rx / DC Orders ED Discharge Orders    None       Lucrezia Starch, MD 06/29/20 2334

## 2020-06-29 NOTE — ED Notes (Signed)
Patient transported to CT. Vitals will be reassessed, once pt returns.

## 2020-06-29 NOTE — ED Notes (Signed)
Pt aware of need for urine sample.  

## 2020-06-29 NOTE — Consult Note (Signed)
NEUROLOGY CONSULTATION NOTE   Date of service: June 29, 2020 Patient Name: Randy Merritt MRN:  [id number] DOB:  1941/05/19 Reason for consult: "aphasia"  History of Present Illness  Randy Merritt is a 79 y.o. male with PMH significant for GERD, depression, HLD, goiter, cervical Disc dz s/p ACDF who presents with both sensory and expressive aphasia for more than a month and ringing sound in his left ear that is off and on.  Aphasia makes obtaining history a significant challenge. He endorses that he has noticed "language trouble" off and on for more than a month. It would last 10 mins and then go away so he did not think much about it. This episode has been going on for more than a few days. He endorses some trouble with understanding language but he has very significant word finding difficulty and that is very frustrating for him.  He also endorses ringing sensation in his left ear and ear feels weird. That too happens off and on over the last month.  No prior history of similar symptoms. No hx of stroke. Not on aspirin. He denies any fever, chills, no headache, no arm or leg weakness, no rash on his body. He works as a Environmental education officer and has been having trouble as he has difficulty with speech.   ROS   Difficult to obtain a full ROS 2/2 significant aphasia.  Past History   Past Medical History:  Diagnosis Date  . Acid reflux   . Anxiety   . Arthritis   . Depression    " a little"  . Goiter   . High cholesterol   . History of blood transfusion   . Perforated bowel ([place])   . Seasonal allergies    Past Surgical History:  Procedure Laterality Date  . ANTERIOR CERVICAL DECOMP/DISCECTOMY FUSION N/A 04/08/2013   Procedure: ANTERIOR CERVICAL DECOMPRESSION/DISCECTOMY FUSION 1 LEVEL;  Surgeon: Floyce Stakes, MD;  Location: [facility];  Service: Neurosurgery;  Laterality: N/A;  Cervical five-six Anterior cervical decompression/diskectomy fusion  . APPENDECTOMY N/A 10/06/2019    Procedure: APPENDECTOMY;  Surgeon: Clovis Riley, MD;  Location: [place];  Service: General;  Laterality: N/A;  . BIOPSY  01/30/2019   Procedure: BIOPSY;  Surgeon: Carol Ada, MD;  [facility]: [facility];  Service: Endoscopy;;  . CERVICAL DISCECTOMY     x2  . CHOLECYSTECTOMY    . COLON RESECTION SIGMOID N/A 10/06/2019   Procedure: COLON RESECTION SIGMOID;  Surgeon: Clovis Riley, MD;  Location: [place];  Service: General;  Laterality: N/A;  . ESOPHAGEAL DILATION  01/30/2019   Procedure: ESOPHAGEAL DILATION;  Surgeon: Carol Ada, MD;  Location: [facility];  Service: Endoscopy;;  [place]  . ESOPHAGOGASTRODUODENOSCOPY (EGD) WITH PROPOFOL N/A 01/30/2019   Procedure: ESOPHAGOGASTRODUODENOSCOPY (EGD) WITH PROPOFOL;  Surgeon: Carol Ada, MD;  [facility];  Service: Endoscopy;  Laterality: N/A;  . gallstone removal    . LAPAROTOMY N/A 10/06/2019   Procedure: EXPLORATORY LAPAROTOMY;  Surgeon: Clovis Riley, MD;  Location: [facility] [facility];  Service: General;  Laterality: N/A;   Family History  Problem Relation Age of Onset  . Healthy Mother   . Healthy Father   . Thyroid disease Neg Hx    Social History   Socioeconomic History  . Marital status: Married    Spouse name: Not on file  . Number of children: Not on file  . Years of education: Not on file  . Highest education level: Not on file  Occupational History  .  Not on file  Tobacco Use  . Smoking status: Former Research scientist (life sciences)  . Smokeless tobacco: Never Used  Vaping Use  . Vaping Use: Never used  Substance and Sexual Activity  . Alcohol use: No  . Drug use: No  . Sexual activity: Not on file  Other Topics Concern  . Not on file  Social History Narrative  . Not on file   Social Determinants of Health   Financial Resource Strain:   . Difficulty of Paying Living Expenses:   Food Insecurity:   . Worried About Charity fundraiser in the Last Year:   . Arboriculturist in the Last Year:   Transportation Needs:   . Consulting civil engineer (Medical):   Marland Kitchen Lack of Transportation (Non-Medical):   Physical Activity:   . Days of Exercise per Week:   . Minutes of Exercise per Session:   Stress:   . Feeling of Stress :   Social Connections:   . Frequency of Communication with Friends and Family:   . Frequency of Social Gatherings with Friends and Family:   . Attends Religious Services:   . Active Member of Clubs or Organizations:   . Attends Archivist Meetings:   Marland Kitchen Marital Status:    No Known Allergies  Medications  (Not in a hospital admission)    Vitals  Temp:  [98.1 F (36.7 C)-99.2 F (37.3 C)] 98.1 F (36.7 C) (08/18 1501) Pulse Rate:  [73-90] 75 (08/18 1501) Resp:  [16-20] 18 (08/18 1501) BP: (128-158)/(75-105) 158/88 (08/18 1501) SpO2:  [100 %] 100 % (08/18 1501)  There is no height or weight on file to calculate BMI.  Physical Exam   General: Laying comfortably in bed; in no acute distress.  HENT: Normal oropharynx and mucosa. Normal external appearance of ears and nose. Neck: Supple, no pain or tenderness CV: No JVD. No peripheral edema. Pulmonary: Symmetric Chest rise. Normal respiratory effort. Abdomen: Soft, mild distention, mild tenderness. Ext: No cyanosis, edema. Skin: No rash. Normal palpation of skin.   Musculoskeletal: BL flexion deformity of fingers. Normal nails by inspection. No clubbing.  Neurologic Examination  Mental status/Cognition: Alert, oriented to self, place, month and year, good attention. Speech/language: Fluent, unable to follow complex commands, does follow one step commands and answer questions, makes paraphasic and phonemic errors, object naming intact, repetition intact. Cranial nerves:   CN II Pupils equal and reactive to light, no VF deficits   CN III,IV,VI EOM intact, no gaze preference or deviation, no nystagmus   CN V normal sensation in V1, V2, and V3 segments bilaterally   CN VII no asymmetry, no nasolabial fold flattening   CN VIII  normal hearing to speech   CN IX & X normal palatal elevation, no uvular deviation   CN XI 5/5 head turn and 5/5 shoulder shrug bilaterally   CN XII midline tongue protrusion   Motor:  Muscle bulk: normal, tone normal, pronator drift none Mvmt Root Nerve  Muscle Right Left Comments  SA C5/6 Ax Deltoid 5 5   EF C5/6 Mc Biceps 5 5   EE C6/7/8 Rad Triceps 5 5   WF C6/7 Med FCR 5 5   WE C7/8 PIN ECU 5 5   Hand Grip C8/T1 U  5 5   HF L1/2/3 Fem Illopsoas 5 5 Pain with hip flexion in his waist  KE L2/3/4 Fem Quad 5 5   DF L4/5 D Peron Tib Ant 5 5   PF  S1/2 Tibial Grc/Sol 5 5    Reflexes:  Right Left Comments  Pectoralis      Biceps (C5/6) 2 2   Brachioradialis (C5/6) 2 2    Triceps (C6/7) 2 2    Patellar (L3/4) 2+ 2+    Achilles (S1) 0 0    [place]man      Plantar mute mute   Jaw jerk    Sensation:  Light touch Intact in all extremities   Pin prick Mildly decreased in LLE at the level of inguinal ligament and below.   Temperature intact   Vibration Decreased in BL lower extremities  Proprioception    Coordination/Complex Motor:  - Finger to Nose intact BL - Heel to shin with ataxia. - Rapid alternating movement are normal - Gait: deferred  Labs   Lab Results  Component Value Date   NA 140 06/29/2020   K 4.4 06/29/2020   CL 103 06/29/2020   CO2 28 06/29/2020   GLUCOSE 100 (H) 06/29/2020   BUN 19 06/29/2020   CREATININE 1.50 (H) 06/29/2020   CALCIUM 9.2 06/29/2020   ALBUMIN 3.9 06/29/2020   AST 25 06/29/2020   ALT 52 (H) 06/29/2020   ALKPHOS 97 06/29/2020   BILITOT 0.9 06/29/2020   GFRNONAA 47 (L) 06/29/2020   GFRAA 55 (L) 06/29/2020     Imaging and Diagnostic studies  MRI Brain without contrast: 1. No acute intracranial abnormality. 2. Moderate chronic small vessel ischemic disease. 3. 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  CTH without contrast: A small lacunar infarct is questioned within the left basal ganglia, new from the prior  head CT of 06/25/2007 but otherwise age-indeterminate. Progressive background mild to moderate generalized parenchymal atrophy and chronic small vessel ischemic disease. No acute intracranial hemorrhage or acute demarcated cortical infarct is identified. Mild ethmoid sinus mucosal thickening.  Impression   Randy Merritt is a 79 y.o. male with PMH significant for GERD, depression, HLD, goiter, cervical Disc dz s/p ACDF who presents with off and on sensory and expressive aphasia for more than a month and ringing sound in his left ear that is off and on. Neuro exam with predominantly expressive aphasia with some sensory aphasia too.   MRI Brain without contrast with small vessel disease and chronic R frontal and R BG infarct. This does not completely explain his episodic aphasia. Will get further workup below.  Recommendations  -MRI Brain with contrast - MRA Head and Neck - routine EEG to rule out epileptic aphasia. ______________________________________________________________________   Thank you for the opportunity to take part in the care of this patient. If you have any further questions, please contact the neurology consultation attending.  Signed,  [place] Pager Number [phone]

## 2020-06-30 ENCOUNTER — Observation Stay (HOSPITAL_COMMUNITY): Payer: 59

## 2020-06-30 ENCOUNTER — Observation Stay (HOSPITAL_BASED_OUTPATIENT_CLINIC_OR_DEPARTMENT_OTHER): Payer: 59

## 2020-06-30 DIAGNOSIS — G459 Transient cerebral ischemic attack, unspecified: Secondary | ICD-10-CM

## 2020-06-30 DIAGNOSIS — R1312 Dysphagia, oropharyngeal phase: Secondary | ICD-10-CM

## 2020-06-30 DIAGNOSIS — R471 Dysarthria and anarthria: Secondary | ICD-10-CM

## 2020-06-30 DIAGNOSIS — R569 Unspecified convulsions: Secondary | ICD-10-CM

## 2020-06-30 DIAGNOSIS — E049 Nontoxic goiter, unspecified: Secondary | ICD-10-CM | POA: Diagnosis not present

## 2020-06-30 LAB — URINALYSIS, ROUTINE W REFLEX MICROSCOPIC
Bilirubin Urine: NEGATIVE
Glucose, UA: NEGATIVE mg/dL
Hgb urine dipstick: NEGATIVE
Ketones, ur: 5 mg/dL — AB
Leukocytes,Ua: NEGATIVE
Nitrite: NEGATIVE
Protein, ur: NEGATIVE mg/dL
Specific Gravity, Urine: 1.02 (ref 1.005–1.030)
pH: 7 (ref 5.0–8.0)

## 2020-06-30 LAB — TROPONIN I (HIGH SENSITIVITY): Troponin I (High Sensitivity): 8 ng/L (ref <18)

## 2020-06-30 LAB — ECHOCARDIOGRAM COMPLETE
Area-P 1/2: 3.91 cm2
Calc EF: 74.4 %
P 1/2 time: 453 msec
S' Lateral: 2 cm
Single Plane A2C EF: 70.2 %
Single Plane A4C EF: 76.7 %

## 2020-06-30 LAB — TSH: TSH: 0.382 u[IU]/mL (ref 0.350–4.500)

## 2020-06-30 LAB — LIPID PANEL
Cholesterol: 197 mg/dL (ref 0–200)
HDL: 41 mg/dL (ref >40)
LDL Cholesterol: 142 mg/dL — ABNORMAL HIGH (ref 0–99)
Total CHOL/HDL Ratio: 4.8 RATIO
Triglycerides: 69 mg/dL (ref <150)
VLDL: 14 mg/dL (ref 0–40)

## 2020-06-30 LAB — HEMOGLOBIN A1C
Hgb A1c MFr Bld: 6.1 % — ABNORMAL HIGH (ref 4.8–5.6)
Mean Plasma Glucose: 128.37 mg/dL

## 2020-06-30 LAB — SARS CORONAVIRUS 2 BY RT PCR (HOSPITAL ORDER, PERFORMED IN ~~LOC~~ HOSPITAL LAB): SARS Coronavirus 2: NEGATIVE

## 2020-06-30 MED ORDER — ATORVASTATIN CALCIUM 40 MG PO TABS
40.0000 mg | ORAL_TABLET | Freq: Every day | ORAL | Status: DC
  Administered 2020-06-30 – 2020-07-01 (×2): 40 mg via ORAL
  Filled 2020-06-30 (×2): qty 1

## 2020-06-30 MED ORDER — GADOBUTROL 1 MMOL/ML IV SOLN
8.0000 mL | Freq: Once | INTRAVENOUS | Status: AC | PRN
  Administered 2020-06-30: 8 mL via INTRAVENOUS

## 2020-06-30 MED ORDER — PANTOPRAZOLE SODIUM 40 MG IV SOLR
40.0000 mg | Freq: Every day | INTRAVENOUS | Status: DC
  Administered 2020-06-30 – 2020-07-01 (×2): 40 mg via INTRAVENOUS
  Filled 2020-06-30 (×2): qty 40

## 2020-06-30 NOTE — Progress Notes (Signed)
SLP Cancellation Note  Patient Details Name: Randy Merritt MRN: [id number] DOB: 1941-08-03   Cancelled treatment:       Reason Eval/Treat Not Completed: Patient at procedure or test/unavailable; Will follow up for speech language evaluation as able   [place] MA, CCC-SLP 06/30/2020, 1:10 PM

## 2020-06-30 NOTE — Evaluation (Addendum)
Clinical/Bedside Swallow Evaluation Patient Details  Name: Randy Merritt MRN: [id number] Date of Birth: 04-28-1941  Today's Date: 06/30/2020 Time: SLP Start Time (ACUTE ONLY): 23 SLP Stop Time (ACUTE ONLY): 0950 SLP Time Calculation (min) (ACUTE ONLY): 30 min  Past Medical History:  Past Medical History:  Diagnosis Date  . Acid reflux   . Anxiety   . Arthritis   . Depression    " a little"  . Goiter   . High cholesterol   . History of blood transfusion   . Perforated bowel ([place])   . Seasonal allergies    Past Surgical History:  Past Surgical History:  Procedure Laterality Date  . ANTERIOR CERVICAL DECOMP/DISCECTOMY FUSION N/A 04/08/2013   Procedure: ANTERIOR CERVICAL DECOMPRESSION/DISCECTOMY FUSION 1 LEVEL;  Surgeon: Floyce Stakes, MD;  Location: [facility];  Service: Neurosurgery;  Laterality: N/A;  Cervical five-six Anterior cervical decompression/diskectomy fusion  . APPENDECTOMY N/A 10/06/2019   Procedure: APPENDECTOMY;  Surgeon: Clovis Riley, MD;  Location: [place];  Service: General;  Laterality: N/A;  . BIOPSY  01/30/2019   Procedure: BIOPSY;  Surgeon: Carol Ada, MD;  Location: [facility];  Service: Endoscopy;;  . CERVICAL DISCECTOMY     x2  . CHOLECYSTECTOMY    . COLON RESECTION SIGMOID N/A 10/06/2019   Procedure: COLON RESECTION SIGMOID;  Surgeon: Clovis Riley, MD;  Location: [place];  Service: General;  Laterality: N/A;  . ESOPHAGEAL DILATION  01/30/2019   Procedure: ESOPHAGEAL DILATION;  Surgeon: Carol Ada, MD;  Location: [facility];  Service: Endoscopy;;  [place]  . ESOPHAGOGASTRODUODENOSCOPY (EGD) WITH PROPOFOL N/A 01/30/2019   Procedure: ESOPHAGOGASTRODUODENOSCOPY (EGD) WITH PROPOFOL;  Surgeon: Carol Ada, MD;  Location: [facility];  Service: Endoscopy;  Laterality: N/A;  . gallstone removal    . LAPAROTOMY N/A 10/06/2019   Procedure: EXPLORATORY LAPAROTOMY;  Surgeon: Clovis Riley, MD;  Location: [facility];  Service: General;   Laterality: N/A;   HPI:  79 y.o. male with history of goiter, acid reflux requiring previous esophageal dilatation, hyperlipidemia BPH had gone to the urgent care with complaints of chest pain and was found to be confused and had difficulty bringing out words.  Patient states he has been having confusion and difficulty bringing up words for most a month. MRI of head unremarkable; chronic infarcts noted.    Assessment / Plan / Recommendation Clinical Impression  A primary esophageal dysphagia continues to be suspected. Of note, past MBSS workup 11/2017 revealed mild pharyngoesophageal dysphagia. Pts spouse at bedside, who assisted to provide hx. Pt with past esosphageal dilation, states some relief post dilation as intermittent globus sensation reported. Clinically this date pt with intermittent throat clear, multiple swallows, and frequent belching (consistent with prior MBSS findings). Despite some impulsivity in self feeding no overt s/sx of aspiration exhibited. Recommend regular thin liquid diet with safe swallowing precautions. Consider barium swallow evaluation & or GI consult to further assess esophagus per MD discretion. SLP to follow up for speech language evaluation   SLP Visit Diagnosis: Dysphagia, pharyngoesophageal phase (R13.14)    Aspiration Risk  Mild aspiration risk    Diet Recommendation   Regular, thin liquids   Medication Administration: Whole meds with liquid    Other  Recommendations Recommended Consults: Consider GI evaluation;Consider esophageal assessment Oral Care Recommendations: Oral care BID   Follow up Recommendations        Frequency and Duration            Prognosis Prognosis for Safe Diet Advancement: Good  Barriers to Reach Goals: Motivation      Swallow Study   General Date of Onset: 06/30/20 HPI: 79 y.o. male with history of goiter, acid reflux requiring previous esophageal dilatation, hyperlipidemia BPH had gone to the urgent care with  complaints of chest pain and was found to be confused and had difficulty bringing out words.  Patient states he has been having confusion and difficulty bringing up words for most a month. MRI of head unremarkable; chronic infarcts noted.  Type of Study: Bedside Swallow Evaluation Previous Swallow Assessment: MBSS: hx mild pharyngoesophageal dysphagia  Diet Prior to this Study: NPO Temperature Spikes Noted: No Respiratory Status: Room air History of Recent Intubation: No Behavior/Cognition: Alert;Cooperative;Pleasant mood;Impulsive Oral Cavity Assessment: Dry Oral Cavity - Dentition: Adequate natural dentition;Missing dentition Vision: Functional for self-feeding Self-Feeding Abilities: Able to feed self Patient Positioning: Upright in bed Baseline Vocal Quality: Normal Volitional Swallow: Able to elicit    Oral/Motor/Sensory Function Overall Oral Motor/Sensory Function: Within functional limits   Ice Chips Ice chips: Within functional limits Presentation: Spoon   Thin Liquid Thin Liquid: Impaired Presentation: Cup;Straw Pharyngeal  Phase Impairments: Suspected delayed Swallow;Multiple swallows;Throat Clearing - Immediate;Throat Clearing - Delayed;Decreased hyoid-laryngeal movement    Nectar Thick Nectar Thick Liquid: Not tested   Honey Thick Honey Thick Liquid: Not tested   Puree Puree: Impaired Presentation: Self Fed Pharyngeal Phase Impairments: Suspected delayed Swallow;Multiple swallows;Throat Clearing - Delayed   Solid     Solid: Within functional limits Presentation: [place] MA, CCC-SLP [facility] 06/30/2020,10:46 AM

## 2020-06-30 NOTE — ED Notes (Signed)
Pt reports that he needs to use the bathroom. Helped pt into a [facility]sp[facility] gown and helped him to the bathroom to have a BM, pt was able to ambulate well.  Pt given a hospital bed for increased comfort (fresh sheets with warm blankets given)

## 2020-06-30 NOTE — Progress Notes (Signed)
Patient confused, seeing people who are not in the room, thinks a tree fell on his bed and that it is raining on him.  Patient was able to be orientated that he was in the hospital, patient then talked with RN about how his "eyes" keep playing tricks on him and that he sees things that is not there .

## 2020-06-30 NOTE — ED Notes (Signed)
Pt pulled IV out 

## 2020-06-30 NOTE — Progress Notes (Signed)
  Echocardiogram 2D Echocardiogram has been performed.  Randy Merritt 06/30/2020, 10:44 AM

## 2020-06-30 NOTE — Progress Notes (Signed)
PROGRESS NOTE  Randy Merritt FBP:[id number] DOB: 1941-03-20 DOA: 06/29/2020 PCP: Shirline Frees, MD   LOS: 0 days   Brief narrative: As per HPI,  Randy Merritt is a 79 y.o. male with history of goiter, GERD requiring previous esophageal dilatation, hyperlipidemia, BPH had iniitally gone to the urgent care with complaints of chest pain and was found to be confused and had difficulty bringing out words.  Patient stated he had been having confusion and difficulty bringing up words for almost a month.  Patient was finding it difficult to talk and remember things and chronological orders. In the ER, patient had MRI of the brain which did not show anything acute. Neurology on-call was consulted. Neurology recommended further work-up including MRI brain with and without contrast and admission for further observation.  Patient did not pass swallow since he had difficulty swallowing from the goiter.  Labs were largely unremarkable except for creatinine of 1.4 which appears to be at baseline when compared to last month. Platelets of 145 comparable to last month.  High sensory troponin was 6. EKG was showing normal sinus rhythm.  Assessment/Plan:   Principal Problem:   TIA (transient ischemic attack) Active Problems:   Oropharyngeal dysphagia   Goiter  Confusion, expressive aphasia.  Unclear etiology. Question medication adverse effect. Neurology has been consulted.  EEG unremarkable.    MRA of the neck was negative.  Negative intracranial MRA.  MRI of the brain was negative for acute findings moderate chronic small vessel ischemic disease.  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  CT head scan was negative for acute findings for lacunar infarct.  Moderate parenchymal atrophy.  Continue aspirin.  Patient has been seen by occupational therapy recommended skilled nursing facility placement versus [facility]OT with 24/7 care.  Physical therapy pending. Patient was on muscle relaxant  (cyclobenzaprine) and prednisone recently for back pain and completed 5 days of it then started complained of left arm feeling weird, palpitations,speech got difficult. Completed prednisone on 06/24/20. Hemoglobin A1c of 6.1. COVID test was negative. Urine analysis was negative. TSH of 0.3 on 09/2019,. Will repeat. Not on oxycodone. Patient is independent at home.   Dysphagia.  History of multinodular goiter.  Had seen ENT as outpatient for goitre. 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 Had esophageal dilation in the past.  Has been seen by speech therapy.  Recommend regular diet with thin consistency liquids.  Chest x-ray with no acute findings.  History of hyperlipidemia  On Lipitor.  History of BPH.   Atypical chest pain in the urgent care.    EKG was unremarkable.  Troponins negative. Will get a chest x-ray.  Will trend cardiac markers.  Nodular goiter.  Follows up with endocrine as outpatient Dr Renato Shin.  Outpatient thyroid nuclear scan, CT soft tissue and double contrast esophagram has been planned.  Elevated creatinine. creatinine of 1.4. will monitor.   DVT prophylaxis: enoxaparin (LOVENOX) injection 40 mg Start: 06/30/20 0000   Code Status: Full code  Family Communication: None  Status is: Observation  The patient will require care spanning > 2 midnights and should be moved to inpatient because: Unsafe d/c plan, high risk of falls,  Persistent expressive dysarthia, pending PT evaluation, Possible need for skilled nursing facility.  Dispo: The patient is from: Home              Anticipated d/c is to: SNF              Anticipated d/c  date is: 2 days              Patient currently is not medically stable to d/c.   Consultants:  Neurology  Procedures:  EEG  Antibiotics:  . None  Anti-infectives (From admission, onward)   None     Subjective: Today, patient was seen and examined at bedside. Patient has expressive dysarthria, restless in bed.  Denies shortness of breath, chest pain, fever. No nausea, vomiting.   Objective: Vitals:   06/30/20 0505 06/30/20 0838  BP: (!) 171/63 (!) 147/100  Pulse: 86 90  Resp: 16 18  Temp:  97.8 F (36.6 C)  SpO2: 100% 100%   No intake or output data in the 24 hours ending 06/30/20 1453 There were no vitals filed for this visit. There is no height or weight on file to calculate BMI.   Physical Exam: GENERAL: Patient is alert awake and communicative with dysarthria, difficulty expression. Not in obvious distress. HENT: No scleral pallor or icterus. Pupils equally reactive to light. Oral mucosa is moist NECK: is supple, no gross swelling noted. CHEST: Clear to auscultation. No crackles or wheezes.  Diminished breath sounds bilaterally. CVS: S1 and S2 heard, no murmur. Regular rate and rhythm.  ABDOMEN: Soft, non-tender, bowel sounds are present. EXTREMITIES: No edema.  CNS: Cranial nerves are intact. Moves all the extremities.  SKIN: warm and dry without rashes.  Data Review: I have personally reviewed the following laboratory data and studies,  CBC: Recent Labs  Lab 06/29/20 1052 06/29/20 1100  WBC 4.5  --   NEUTROABS 2.7  --   HGB 13.2 13.9  HCT 40.6 41.0  MCV 97.1  --   PLT 147*  --    Basic Metabolic Panel: Recent Labs  Lab 06/29/20 1052 06/29/20 1100  NA 138 140  K 4.4 4.4  CL 102 103  CO2 28  --   GLUCOSE 105* 100*  BUN 16 19  CREATININE 1.41* 1.50*  CALCIUM 9.2  --    Liver Function Tests: Recent Labs  Lab 06/29/20 1052  AST 25  ALT 52*  ALKPHOS 97  BILITOT 0.9  PROT 6.9  ALBUMIN 3.9   No results for input(s): LIPASE, AMYLASE in the last 168 hours. No results for input(s): AMMONIA in the last 168 hours. Cardiac Enzymes: No results for input(s): CKTOTAL, CKMB, CKMBINDEX, TROPONINI in the last 168 hours. BNP (last 3 results) No results for input(s): BNP in the last 8760 hours.  ProBNP (last 3 results) No results for input(s): PROBNP in the last  8760 hours.  CBG: No results for input(s): GLUCAP in the last 168 hours. Recent Results (from the past 240 hour(s))  SARS Coronavirus 2 by RT PCR (hospital order, performed in [facility] hospital lab) Nasopharyngeal Nasopharyngeal Swab     Status: None   Collection Time: 06/29/20 11:18 PM   Specimen: Nasopharyngeal Swab  Result Value Ref Range Status   SARS Coronavirus 2 NEGATIVE NEGATIVE Final    Comment: (NOTE) SARS-CoV-2 target nucleic acids are NOT DETECTED.  The SARS-CoV-2 RNA is generally detectable in upper and lower respiratory specimens during the acute phase of infection. The lowest concentration of SARS-CoV-2 viral copies this assay can detect is 250 copies / mL. A negative result does not preclude SARS-CoV-2 infection and should not be used as the sole basis for treatment or other patient management decisions.  A negative result may occur with improper specimen collection / handling, submission of specimen other than nasopharyngeal swab, presence  of viral mutation(s) within the areas targeted by this assay, and inadequate number of viral copies (<250 copies / mL). A negative result must be combined with clinical observations, patient history, and epidemiological information.  Fact Sheet for Patients:   StrictlyIdeas.no  Fact Sheet for Healthcare Providers: BankingDealers.co.za  This test is not yet approved or  cleared by the [place] FDA and has been authorized for detection and/or diagnosis of SARS-CoV-2 by FDA under an Emergency Use Authorization (EUA).  This EUA will remain in effect (meaning this test can be used) for the duration of the COVID-19 declaration under Section 564(b)(1) of the Act, 21 U.S.C. section 360bbb-3(b)(1), unless the authorization is terminated or revoked sooner.  Performed at [facility], [place] [address] [place], [place] [address]      Studies: CT HEAD WO CONTRAST  Result  Date: 06/29/2020 CLINICAL DATA:  Neuro deficit, acute, stroke suspected. Additional provided: Patient reports feeling confused, difficulty with words, symptoms for several days. EXAM: CT HEAD WITHOUT CONTRAST TECHNIQUE: Contiguous axial images were obtained from the base of the skull through the vertex without intravenous contrast. COMPARISON:  Head CT 06/25/2007 FINDINGS: Brain: Mild-to-moderate generalized parenchymal atrophy. Mild-to-moderate ill-defined hypoattenuation within the cerebral white matter is nonspecific, but consistent with chronic small vessel ischemic disease. These findings have progressed as compared to the head CT of 06/25/2007. A small lacunar infarct is questioned within the left lentiform nucleus, new from the prior examination but otherwise age indeterminate (series 3, image 15). There is no acute intracranial hemorrhage. No demarcated cortical infarct is identified. No extra-axial fluid collection. No evidence of intracranial mass. No midline shift. Vascular: No hyperdense vessel.  Atherosclerotic calcifications Skull: Normal. Negative for fracture or focal lesion. Sinuses/Orbits: Visualized orbits show no acute finding. Mild ethmoid sinus mucosal thickening. No significant mastoid effusion. IMPRESSION: A small lacunar infarct is questioned within the left basal ganglia, new from the prior head CT of 06/25/2007 but otherwise age-indeterminate. Progressive background mild to moderate generalized parenchymal atrophy and chronic small vessel ischemic disease. No acute intracranial hemorrhage or acute demarcated cortical infarct is identified. Mild ethmoid sinus mucosal thickening. Electronically Signed   By: Kellie Simmering DO   On: 06/29/2020 12:34   MR ANGIO HEAD WO CONTRAST  Result Date: 06/30/2020 CLINICAL DATA:  CNS vasculitis. Expressive aphasia with extensive white matter disease. EXAM: MRA HEAD WITHOUT CONTRAST TECHNIQUE: Angiographic images of the Circle of Willis were obtained  using MRA technique without intravenous contrast. COMPARISON:  None. FINDINGS: Repeat diffusion was performed and no acute infarct is seen. Mild left vertebral artery dominance. The vertebral and basilar arteries are smooth and widely patent. Unremarkable anterior circulation anatomy. Signal loss in the carotids at the skull base, symmetric and at a common site for artifact. Mild intracranial tortuosity. No beading, branch occlusion, aneurysm, or flow limiting stenosis. IMPRESSION: Negative intracranial MRA. Repeat diffusion is negative for acute infarct. Electronically Signed   By: Monte Fantasia M.D.   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 EXAM: MRA NECK WITHOUT AND WITH CONTRAST TECHNIQUE: Multiplanar and multiecho pulse sequences of the neck were obtained without and with intravenous contrast. Angiographic images of the neck were obtained using MRA technique without and with intravenous contrast. CONTRAST:  81mL GADAVIST GADOBUTROL 1 MMOL/ML IV SOLN COMPARISON:  None. FINDINGS: Time-of-flight imaging shows antegrade flow in the bilateral carotid  and vertebral circulation. Precontrast mask shows goiter, known from neck CT in 2020. The covered arch is smooth and normal diameter. The right more than left common carotid arteries are laterally deviated due to the goiter. ICA tortuosity with loop on the left. No proximal subclavian stenosis. Bilateral vertebral tortuosity. No stenosis, ulceration, or beading. IMPRESSION: Negative neck MRA. Electronically Signed   By: Monte Fantasia M.D.   On: 06/30/2020 04:41   MR BRAIN WO CONTRAST  Result Date: 06/29/2020 CLINICAL DATA:  Speech difficulties. EXAM: MRI HEAD WITHOUT CONTRAST TECHNIQUE: Multiplanar, multiecho pulse sequences of the brain and surrounding structures were obtained without  intravenous contrast. COMPARISON:  Head CT 06/29/2020 FINDINGS: Brain: There is no evidence of an acute infarct, intracranial hemorrhage, mass, midline shift, or extra-axial fluid collection. There is a chronic lacunar infarct at the posterior aspect of the right lentiform nucleus. There is a small chronic cortical infarct in the high right frontal lobe. Patchy T2 hyperintensities in the cerebral white matter bilaterally are nonspecific but compatible with moderate chronic small vessel ischemic disease. Mild chronic small vessel changes are noted in the pons. There is mild cerebral atrophy. Vascular: Major intracranial vascular flow voids are preserved. Skull and upper cervical spine: Unremarkable bone marrow signal. Anterior fusion at C3-4. Sinuses/Orbits: Unremarkable orbits. Paranasal sinuses and mastoid air cells are clear. Other: None. IMPRESSION: 1. No acute intracranial abnormality. 2. Moderate chronic small vessel ischemic disease. 3. 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 Electronically Signed   By: Logan Bores M.D.   On: 06/29/2020 17:34   MR BRAIN W CONTRAST  Result Date: 06/30/2020 CLINICAL DATA:  CNS vasculitis. Significant aphasia out of proportion to altered mental status. Left ear pain EXAM: MRI HEAD WITH CONTRAST TECHNIQUE: Multiplanar, multiecho pulse sequences of the brain and surrounding structures were obtained with intravenous contrast. CONTRAST:  32mL GADAVIST GADOBUTROL 1 MMOL/ML IV SOLN COMPARISON:  Brain MRI from yesterday FINDINGS: Brain: No abnormal intracranial enhancement. Vascular: Major intracranial vessels are enhancing. Skull and upper cervical spine: No evidence of bone lesion or inflammation. C3-4 ACDF with solid arthrodesis. Sinuses/Orbits: No evidence of sinusitis or orbital inflammation. No finding at the symptomatic left ear or mastoid. IMPRESSION: No abnormal enhancement.  No findings at the symptomatic left ear. Electronically Signed   By: Monte Fantasia M.D.   On: 06/30/2020 04:31   DG CHEST PORT 1 VIEW  Result Date: 06/30/2020 CLINICAL DATA:  Chest pain. EXAM: PORTABLE CHEST 1 VIEW COMPARISON:  Radiograph 10/06/2019 FINDINGS: The cardiomediastinal contours are normal. There is leftward tracheal deviation that is chronic when compared to 10/06/2019 exam. Pulmonary vasculature is normal. No consolidation, pleural effusion, or pneumothorax. No acute osseous abnormalities are seen. Surgical hardware in the lower cervical spine. IMPRESSION: No acute chest findings. Electronically Signed   By: Keith Rake M.D.   On: 06/30/2020 01:54   EEG adult  Result Date: 06/30/2020 [name], MD     06/30/2020  2:02 PM Patient Name: Randy Merritt MRN: [id number] Epilepsy Attending: [name] Referring Physician/Provider: [place] Cerise [name] Date: 8/19/ 2021 Duration: 26.08 minutes Patient history: 79 year old male presented with waxing and waning sensory and expressive aphasia for more than a month.  EEG evaluate for seizures. Level of alertness: Awake AEDs during EEG study: None Technical aspects: This EEG study was done with scalp electrodes positioned according to the 10-20 International system of electrode placement. Electrical activity was acquired at a sampling rate of 500Hz  and reviewed with a high frequency filter of  70Hz  and a low frequency filter of 1Hz . EEG data were recorded continuously and digitally stored. Description: The posterior dominant rhythm consists of 8 Hz activity of moderate voltage (25-35 uV) seen predominantly in posterior head regions, symmetric and reactive to eye opening and eye closing. Hyperventilation and photic stimulation were not performed.   IMPRESSION: This study is within normal limits. No seizures or definite epileptiform discharges were seen throughout the recording. [name]   ECHOCARDIOGRAM COMPLETE  Result Date: 06/30/2020    ECHOCARDIOGRAM REPORT   Patient Name:   Randy Merritt Date of  Exam: 06/30/2020 Medical Rec #:  [id number]      Height:       68.0 in Accession #:    [id number]     Weight:       175.0 lb Date of Birth:  Aug 14, 1941       BSA:          1.931 m Patient Age:    18 years       BP:           147/100 mmHg Patient Gender: M              HR:           82 bpm. Exam Location:  Inpatient Procedure: 2D Echo, Cardiac Doppler and Color Doppler Indications:    TIA  History:        Patient has no prior history of Echocardiogram examinations.                 Risk Factors:Dyslipidemia. No prior cardiac history.  Sonographer:    Roseanna Rainbow RDCS Referring Phys: [place]  1. Left ventricular ejection fraction, by estimation, is 70 to 75%. The left ventricle has hyperdynamic function. The left ventricle has no regional wall motion abnormalities. There is mild left ventricular hypertrophy. Left ventricular diastolic parameters are indeterminate.  2. Right ventricular systolic function is normal. The right ventricular size is normal.  3. The mitral valve is normal in structure. Trivial mitral valve regurgitation.  4. The aortic valve is normal in structure. Aortic valve regurgitation is trivial.  5. Aortic dilatation noted. 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  6. The inferior vena cava is normal in size with greater than 50% respiratory variability, suggesting right atrial pressure of 3 mmHg. FINDINGS  Left Ventricle: Left ventricular ejection fraction, by estimation, is 70 to 75%. The left ventricle has hyperdynamic function. The left ventricle has no regional wall motion abnormalities. The left ventricular internal cavity size was normal in size. There is mild left ventricular hypertrophy. Left ventricular diastolic parameters are indeterminate. Right Ventricle: The right ventricular size is normal. Right vetricular wall thickness was not assessed. Right ventricular systolic function is normal. Left Atrium: Left atrial size was normal in size.  Right Atrium: Right atrial size was normal in size. Pericardium: Trivial pericardial effusion is present. Mitral Valve: The mitral valve is normal in structure. Trivial mitral valve regurgitation. Tricuspid Valve: The tricuspid valve is normal in structure. Tricuspid valve regurgitation is trivial. Aortic Valve: The aortic valve is normal in structure. Aortic valve regurgitation is trivial. Aortic regurgitation PHT measures 453 msec. Pulmonic Valve: The pulmonic valve was normal in structure. Pulmonic valve regurgitation is trivial. Aorta: Aortic dilatation noted. 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 Venous: The inferior vena cava is normal in size with greater than 50% respiratory variability, suggesting right atrial pressure  of 3 mmHg. IAS/Shunts: No atrial level shunt detected by color flow Doppler.  LEFT VENTRICLE PLAX 2D LVIDd:         3.30 cm     Diastology LVIDs:         2.00 cm     LV e' lateral:   7.29 cm/s LV PW:         1.30 cm     LV E/e' lateral: 10.3 LV IVS:        1.50 cm     LV e' medial:    5.33 cm/s LVOT diam:     1.90 cm     LV E/e' medial:  14.0 LV SV:         69 LV SV Index:   36 LVOT Area:     2.84 cm  LV Volumes (MOD) LV vol d, MOD A2C: 41.0 ml LV vol d, MOD A4C: 63.4 ml LV vol s, MOD A2C: 12.2 ml LV vol s, MOD A4C: 14.8 ml LV SV MOD A2C:     28.8 ml LV SV MOD A4C:     63.4 ml LV SV MOD BP:      39.4 ml RIGHT VENTRICLE             IVC RV S prime:     19.70 cm/s  IVC diam: 1.00 cm TAPSE (M-mode): 2.2 cm LEFT ATRIUM             Index       RIGHT ATRIUM           Index LA diam:        2.20 cm 1.14 cm/m  RA Area:     14.00 cm LA Vol (A2C):   28.2 ml 14.60 ml/m RA Volume:   35.50 ml  18.38 ml/m LA Vol (A4C):   30.2 ml 15.64 ml/m LA Biplane Vol: 30.9 ml 16.00 ml/m  AORTIC VALVE             PULMONIC VALVE LVOT Vmax:   139.00 cm/s PR End Diast Vel: 1.66 msec LVOT Vmean:  86.100 cm/s LVOT VTI:    0.244 m AI PHT:      453 msec  AORTA Ao Root diam: 3.60 cm Ao Asc diam:   4.05 cm MITRAL VALVE MV Area (PHT): 3.91 cm    SHUNTS MV Decel Time: 194 msec    Systemic VTI:  0.24 m MV E velocity: 74.80 cm/s  Systemic Diam: 1.90 cm MV A velocity: 81.30 cm/s MV E/A ratio:  0.92 Dorris Carnes MD Electronically signed by Dorris Carnes MD Signature Date/Time: 06/30/2020/2:07:09 PM    Final       Flora Lipps, MD  [facility] 06/30/2020

## 2020-06-30 NOTE — ED Notes (Signed)
EEG at bedside.

## 2020-06-30 NOTE — Evaluation (Signed)
Occupational Therapy Evaluation Patient Details Name: Randy Merritt MRN: [id number] DOB: 11-22-1940 Today's Date: 06/30/2020    History of Present Illness Randy Merritt is a 79 y.o. 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 Currently he is min A when up on his feet (with a stated fear of falling), is not completely oriented and speech is very tangential. He will benefit from acute OT with follow up at SNF, unless family can provide 24 hour care then the recommendation would be home with [facility].    Follow Up Recommendations  SNF (if has 24/7 S/A at home could do [facility])    Equipment Recommendations  None recommended by OT       Precautions / Restrictions Precautions Precautions: Fall Restrictions Weight Bearing Restrictions: No      Mobility Bed Mobility Overal bed mobility: Needs Assistance Bed Mobility: Supine to Sit;Sit to Supine     Supine to sit: Supervision;HOB elevated Sit to supine: Supervision      Transfers Overall transfer level: Needs assistance Equipment used: None Transfers: Sit to/from Stand Sit to Stand: Min assist         General transfer comment: "Don't let me fall"    Balance Overall balance assessment: Needs assistance Sitting-balance support: No upper extremity supported;Feet supported Sitting balance-Leahy Scale: Good     Standing balance support: Single extremity supported Standing balance-Leahy Scale: Poor                             ADL either performed or assessed with clinical judgement    ADL Overall ADL's : Needs assistance/impaired Eating/Feeding: NPO   Grooming: Supervision/safety;Set up;Sitting Grooming Details (indicate cue type and reason): side of bed Upper Body Bathing: Supervision/ safety;Set up;Sitting Upper Body Bathing Details (indicate cue type and reason): side of bed Lower Body Bathing: Minimal assistance;Sit to/from stand Lower Body Bathing Details (indicate cue type and reason): side of bed Upper Body Dressing : Set up;Supervision/safety;Sitting Upper Body Dressing Details (indicate cue type and reason): side of bed Lower Body Dressing: Minimal assistance;Sit to/from stand Lower Body Dressing Details (indicate cue type and reason): side of bed Toilet Transfer: Minimal assistance Toilet Transfer Details (indicate cue type and reason): side step up and down along EOB Toileting- Clothing Manipulation and Hygiene: Minimal assistance;Sit to/from stand Toileting - Clothing Manipulation Details (indicate cue type and reason): side of bed             Vision Baseline Vision/History: Wears glasses Wears Glasses: Reading only Patient Visual Report: No change from baseline              Pertinent Vitals/Pain Pain Assessment: No/denies pain     Hand Dominance Right   Extremity/Trunk Assessment Upper Extremity Assessment Upper Extremity Assessment: Overall WFL for tasks assessed           Communication Communication Communication: No difficulties (until the last month)   Cognition Arousal/Alertness: Awake/alert Behavior During Therapy: Restless Overall Cognitive Status: No family/caregiver present to determine baseline cognitive functioning  General Comments: Pt reported he was at [facility] (when cued that he was at the other hospital close by to [facility] he could not come up with the name. Month was July, Year 2021, did not know why they had brought him to hospital from urgent care (did  recall that he drove himslef to urgent care); followed all one step commands              Home Living Family/patient expects to be discharged to:: Private residence Living Arrangements: Spouse/significant other Available Help at Discharge: Family;Available PRN/intermittently Type of Home: House Home Access: Stairs to enter [employer] of Steps: 3 Entrance Stairs-Rails: None Home Layout: One level     Bathroom Shower/Tub: Corporate investment banker: Standard     Home Equipment: None   Additional Comments: patient very tangential when asked questions, answers he gave me are above but unsure of accuracy. He reports his wife works      Prior Functioning/Environment Level of Independence: Independent                 OT Problem List: Impaired balance (sitting and/or standing);Decreased cognition;Decreased safety awareness      OT Treatment/Interventions: Self-care/ADL training;DME and/or AE instruction;Patient/family education;Balance training;Cognitive remediation/compensation    OT Goals(Current goals can be found in the care plan section) Acute Rehab OT Goals Patient Stated Goal: to get something to drink OT Goal Formulation: Patient unable to participate in goal setting Time For Goal Achievement: 07/14/20 Potential to Achieve Goals: Good  OT Frequency: Min 2X/week   Barriers to D/C: Decreased caregiver support  Pt reports wife works          AM-PAC OT "6 Clicks" Daily Activity     Outcome Measure Help from another person eating meals?: Total (currently NPO at time of my eval) Help from another person taking care of personal grooming?: A Little Help from another person toileting, which includes using toliet, bedpan, or urinal?: A Little Help from another person bathing (including washing, rinsing, drying)?: A Little Help from another person to put on and taking off regular upper body clothing?: A Little Help from another person to  put on and taking off regular lower body clothing?: A Little 6 Click Score: 16   End of Session Equipment Utilized During Treatment: Gait belt Nurse Communication: Mobility status  Activity Tolerance: Patient tolerated treatment well Patient left: in bed;with call bell/phone within reach;with bed alarm set  OT Visit Diagnosis: Unsteadiness on feet (R26.81);Other abnormalities of gait and mobility (R26.89);Muscle weakness (generalized) (M62.81);Other symptoms and signs involving cognitive function                Time: 0998-3382 OT Time Calculation (min): 18 min Charges:  OT General Charges $OT Visit: 1 Visit OT Evaluation $OT Eval Moderate Complexity: 1 Mod  Golden Circle, OTR/L Acute [employer] Pager [phone] Office [phone]     Almon Register 06/30/2020, 12:02 PM

## 2020-06-30 NOTE — Procedures (Signed)
Patient Name: Randy Merritt  MRN: [id number]  Epilepsy Attending: [name]  Referring Physician/Provider: York Cerise [name] Date: 8/19/ 2021 Duration: 26.08 minutes  Patient history: 79 year old male presented with waxing and waning sensory and expressive aphasia for more than a month.  EEG evaluate for seizures.  Level of alertness: Awake  AEDs during EEG study: None  Technical aspects: This EEG study was done with scalp electrodes positioned according to the 10-20 International system of electrode placement. Electrical activity was acquired at a sampling rate of 500Hz  and reviewed with a high frequency filter of 70Hz  and a low frequency filter of 1Hz . EEG data were recorded continuously and digitally stored.   Description: The posterior dominant rhythm consists of 8 Hz activity of moderate voltage (25-35 uV) seen predominantly in posterior head regions, symmetric and reactive to eye opening and eye closing. Hyperventilation and photic stimulation were not performed.     IMPRESSION: This study is within normal limits. No seizures or definite epileptiform discharges were seen throughout the recording.  Renay Crammer [name]

## 2020-06-30 NOTE — Progress Notes (Signed)
NEUROLOGY Progress Note   Date of service: June 30, 2020 Patient Name: Adonis Conto MRN:  [id number] DOB:  01-31-1941 Reason for consult: "aphasia"  History of Present Illness  Fate Mccrary is a 79 y.o. male with PMH significant for GERD, depression, HLD, goiter, cervical Disc dz s/p ACDF who presents with waxing/waning sensory and expressive aphasia for more than a month. He has some ringing sounds in his left ear that also is associated with onset of the spells.  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    Vitals  Temp:  [97.8 F (36.6 C)-98.8 F (37.1 C)] 97.8 F (36.6 C) (08/19 0838) Pulse Rate:  [73-90] 90 (08/19 0838) Resp:  [16-24] 18 (08/19 0838) BP: (131-177)/(63-115) 147/100 (08/19 0838) SpO2:  [98 %-100 %] 100 % (08/19 0838)  There is no height or weight on file to calculate BMI.  Physical Exam   General: Laying comfortably in bed; in no acute distress.  HENT: Normal oropharynx and mucosa. Normal external appearance of ears and nose. Neck: Supple, no pain or tenderness CV: No JVD. No peripheral edema. Pulmonary: Symmetric Chest rise. Normal respiratory effort. Abdomen: Soft, mild distention, mild tenderness. Ext: No cyanosis, edema. Skin: No rash. Normal palpation of skin.   Musculoskeletal: BL flexion deformity/contraction of fingers. Normal nails by inspection. No clubbing.  Neurologic Examination  Mental status/Cognition: Alert, oriented to self, place, month and year, good attention. Speech/language: Fluent, able to follow complex commands slowly and with some visual clues. Speech is slow, but fluent able to repeat tongue twister and object naming intact, repetition intact. Cranial nerves:   CN II Pupils equal and reactive to light, no VF deficits   CN III,IV,VI EOM intact, no gaze preference or deviation, no nystagmus   CN V normal sensation in V1, V2, and V3 segments bilaterally   CN VII  no asymmetry, no nasolabial fold flattening   CN VIII normal hearing to speech   CN IX & X normal palatal elevation, no uvular deviation   CN XI 5/5 head turn and 5/5 shoulder shrug bilaterally   CN XII midline tongue protrusion   Motor:  Muscle bulk: normal, tone normal, pronator drift none Mvmt Root Nerve  Muscle Right Left Comments  SA C5/6 Ax Deltoid 5 5   EF C5/6 Mc Biceps 5 5   EE C6/7/8 Rad Triceps 5 5   WF C6/7 Med FCR 5 5   WE C7/8 PIN ECU 5 5   Hand Grip C8/T1 U  5 5   HF L1/2/3 Fem Illopsoas 5 5 Pain with hip flexion in his waist  KE L2/3/4 Fem Quad 5 5   DF L4/5 D Peron Tib Ant 5 5   PF S1/2 Tibial Grc/Sol 5 5    Reflexes:  Right Left Comments  Pectoralis      Biceps (C5/6) 2 2   Brachioradialis (C5/6) 2 2    Triceps (C6/7) 2 2    Patellar (L3/4) 2+ 2+    Achilles (S1) 0 0    Hoffman      Plantar mute mute   Jaw jerk    Sensation: intact to light touch throughout  Coordination/Complex Motor:  - Finger to Nose intact BL - Heel to shin is slow, no gross ataxia today noted. - Rapid alternating movement are normal - Gait: deferred  Labs   Lab Results  Component Value Date   NA 140 06/29/2020  K 4.4 06/29/2020   CL 103 06/29/2020   CO2 28 06/29/2020   GLUCOSE 100 (H) 06/29/2020   BUN 19 06/29/2020   CREATININE 1.50 (H) 06/29/2020   CALCIUM 9.2 06/29/2020   ALBUMIN 3.9 06/29/2020   AST 25 06/29/2020   ALT 52 (H) 06/29/2020   ALKPHOS 97 06/29/2020   BILITOT 0.9 06/29/2020   GFRNONAA 47 (L) 06/29/2020   GFRAA 55 (L) 06/29/2020     Imaging and Diagnostic studies  MRI Brain without contrast: 1. No acute intracranial abnormality. 2. Moderate chronic small vessel ischemic disease. 3. 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 MRA head/neck: no abnormality  Impression   Arnett Habibi is a 79 y.o. male with PMH significant for GERD, depression, HLD, goiter, cervical Disc dz s/p ACDF who presents with wax/waning sensory and expressive  aphasia for more than a month. He has some ringing sounds in his left ear that also is associated with onset of the spells. 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   1. 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 Other ddx is TIA 2. H/o stroke without clinical symptoms- daily ASA 3. HTN- normotensive goals, no role in permissive 4. HLD- LDL is 142, given prev stroke, goal <100, will start statin  Recommendations  -cEEG to capture spells -Will ck results of EEG prior to starting ASD -Neuro checks -Seizure precautions  -Vascular risk modifications -Secondary stroke prevention (despite being old stroke, he should be on max mgt): Statin and ASA  Conlee Sliter Metzger-Cihelka, ARNP-C, ANVP-BC Pager: [phone]

## 2020-06-30 NOTE — ED Notes (Signed)
OT at bedside. 

## 2020-07-01 DIAGNOSIS — R4701 Aphasia: Secondary | ICD-10-CM | POA: Diagnosis not present

## 2020-07-01 DIAGNOSIS — T380X5A Adverse effect of glucocorticoids and synthetic analogues, initial encounter: Secondary | ICD-10-CM | POA: Diagnosis not present

## 2020-07-01 DIAGNOSIS — E049 Nontoxic goiter, unspecified: Secondary | ICD-10-CM | POA: Diagnosis not present

## 2020-07-01 LAB — BASIC METABOLIC PANEL
Anion gap: 8 (ref 5–15)
BUN: 13 mg/dL (ref 8–23)
CO2: 26 mmol/L (ref 22–32)
Calcium: 8.9 mg/dL (ref 8.9–10.3)
Chloride: 106 mmol/L (ref 98–111)
Creatinine, Ser: 1.34 mg/dL — ABNORMAL HIGH (ref 0.61–1.24)
GFR calc Af Amer: 58 mL/min — ABNORMAL LOW (ref >60)
GFR calc non Af Amer: 50 mL/min — ABNORMAL LOW (ref >60)
Glucose, Bld: 92 mg/dL (ref 70–99)
Potassium: 3.9 mmol/L (ref 3.5–5.1)
Sodium: 140 mmol/L (ref 135–145)

## 2020-07-01 LAB — CBC
HCT: 35.7 % — ABNORMAL LOW (ref 39.0–52.0)
Hemoglobin: 11.9 g/dL — ABNORMAL LOW (ref 13.0–17.0)
MCH: 31.6 pg (ref 26.0–34.0)
MCHC: 33.3 g/dL (ref 30.0–36.0)
MCV: 94.9 fL (ref 80.0–100.0)
Platelets: 138 10*3/uL — ABNORMAL LOW (ref 150–400)
RBC: 3.76 MIL/uL — ABNORMAL LOW (ref 4.22–5.81)
RDW: 11.9 % (ref 11.5–15.5)
WBC: 4.5 10*3/uL (ref 4.0–10.5)
nRBC: 0 % (ref 0.0–0.2)

## 2020-07-01 LAB — MAGNESIUM: Magnesium: 2.2 mg/dL (ref 1.7–2.4)

## 2020-07-01 NOTE — Discharge Summary (Signed)
[name] Date: 8/19/ 2021 Duration: 26.08 minutes Patient history:  79 year old male presented with waxing and waning sensory and expressive aphasia for more than a month.  EEG evaluate for seizures. Level of alertness: Awake AEDs during EEG study: None Technical aspects: This EEG study was done with scalp electrodes positioned according to the 10-20 International system of electrode placement. Electrical activity was acquired at a sampling rate of 500Hz  and reviewed with a high frequency filter of 70Hz  and a low frequency filter of 1Hz . EEG data were recorded continuously and digitally stored. Description: The posterior dominant rhythm consists of 8 Hz activity of moderate voltage (25-35 uV) seen predominantly in posterior head regions, symmetric and reactive to eye opening and eye closing. Hyperventilation and photic stimulation were not performed.   IMPRESSION: This study is within normal limits. No seizures or definite epileptiform discharges were seen throughout the recording. [name]   ECHOCARDIOGRAM COMPLETE  Result Date: 06/30/2020    ECHOCARDIOGRAM REPORT   Patient Name:   Randy Merritt Date of Exam: 06/30/2020 Medical Rec #:  [id number]      Height:       68.0 in Accession #:    [id number]     Weight:       175.0 lb Date of Birth:  05-Oct-1941       BSA:          1.931 m Patient Age:    32 years       BP:           147/100 mmHg Patient Gender: M              HR:           82 bpm. Exam Location:  Inpatient Procedure: 2D Echo, Cardiac Doppler and Color Doppler Indications:    TIA  History:        Patient has no prior history of Echocardiogram examinations.                 Risk Factors:Dyslipidemia. No prior cardiac history.  Sonographer:    Roseanna Rainbow RDCS Referring Phys: [place]  1. Left ventricular ejection fraction, by estimation, is 70 to 75%. The left ventricle has hyperdynamic function. The left ventricle has no regional wall motion abnormalities. There is mild left ventricular hypertrophy. Left ventricular diastolic parameters are  indeterminate.  2. Right ventricular systolic function is normal. The right ventricular size is normal.  3. The mitral valve is normal in structure. Trivial mitral valve regurgitation.  4. The aortic valve is normal in structure. Aortic valve regurgitation is trivial.  5. Aortic dilatation noted. 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  6. The inferior vena cava is normal in size with greater than 50% respiratory variability, suggesting right atrial pressure of 3 mmHg. FINDINGS  Left Ventricle: Left ventricular ejection fraction, by estimation, is 70 to 75%. The left ventricle has hyperdynamic function. The left ventricle has no regional wall motion abnormalities. The left ventricular internal cavity size was normal in size. There is mild left ventricular hypertrophy. Left ventricular diastolic parameters are indeterminate. Right Ventricle: The right ventricular size is normal. Right vetricular wall thickness was not assessed. Right ventricular systolic function is normal. Left Atrium: Left atrial size was normal in size. Right Atrium: Right atrial size was normal in size. Pericardium: Trivial pericardial effusion is present. Mitral Valve: The mitral valve is normal in structure. Trivial mitral valve regurgitation. Tricuspid Valve: The tricuspid valve  Physician Discharge Summary  Randy Merritt OFH:[id number] DOB: 05-31-1941 DOA: 06/29/2020  PCP: Shirline Frees, MD  Admit date: 06/29/2020 Discharge date: 07/01/2020  Admitted From: Home  Discharge disposition: Home with home health   Recommendations for Outpatient Follow-Up:    Follow up with your primary care provider in one week.   Check CBC, BMP, magnesium in the next visit  Follow-up with [facility] in 1 month. Internal referral has been made.  Follow-up with home physical therapy and speech therapy  Discharge Diagnosis:   Principal Problem:   Aphasia Active Problems:   Goiter Medication adverse effect   Discharge Condition: Improved.  Diet recommendation: Low sodium, heart healthy.  Wound care: None.  Code status: Full.   History of Present Illness:   Randy Andrewsis a 79 y.o.malewithhistory of goiter, GERD requiring previous esophageal dilatation, hyperlipidemia, BPH had iniitally gone to the urgent care with complaints of chest pain and was found to be confused and had difficulty bringing out words. Patient was finding it difficult to talk and remember things and chronological orders.In the ER, patient had MRI of the brain which did not show anything acute. Neurology on-call was consulted. Neurology recommended further work-up including MRI brain with and without contrast and admission for further observation. Patient did not pass swallow since he had difficulty swallowing from the goiter. Labs were largely unremarkable except for creatinine of 1.4 which appears to be at baseline when compared to last month. Platelets of 145 comparable to last month. High sensory troponin was 6. EKG was showing normal sinus rhythm.  Patient was then admitted to the hospital.   Hospital Course:   Following conditions were addressed during hospitalization as listed below,  Confusion, expressive aphasia.  Improved.  Likely medication adverse effect  from prednisone and muscle relaxant.  Initially, there was concern for possible TIA.  Neurology was consulted.  EEG was unremarkable.  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  Patient was seen by physical therapy who recommended home PT on discharge. Hemoglobin A1c of 6.1. COVID test was negative. Urine analysis was negative. TSH of 0.3.  Spoke with neurology prior to discharge  Dysphagia.  History of multinodular goiter.  Had seen ENT as outpatient for goitre. 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 Had esophageal dilation in the past.  Has been seen by speech therapy.  Recommend regular diet with thin consistency liquids.  Chest x-ray with no acute findings.  History of hyperlipidemia  On Lipitor.  Atypical chest pain  EKG was unremarkable.  Troponins negative.Will get a chest x-ray. Will trend cardiac markers.  Nodular goiter.  Follows up with endocrine as outpatient Dr Renato Shin.  Outpatient thyroid nuclear scan, CT soft tissue and double contrast esophagram has been planned.  Elevated creatinine. creatinine of 1.4 on presentation.  Received IV fluids.  Creatinine of 1.3 prior to discharge.  Disposition.  At this time, patient is stable for disposition. Spoke with the patient's wife on the phone and updated her about the clinical condition of the patient and the plan for disposition..  Medical Consultants:    Neurology  Procedures:    EEG Subjective:   Today, patient examined at bedside.  Much more coherent and logical and answering appropriately.  Discharge Exam:   Vitals:   07/01/20 0824 07/01/20 1207  BP: 139/84 (!) 155/89  Pulse: 82 79  [name] Date: 8/19/ 2021 Duration: 26.08 minutes Patient history:  79 year old male presented with waxing and waning sensory and expressive aphasia for more than a month.  EEG evaluate for seizures. Level of alertness: Awake AEDs during EEG study: None Technical aspects: This EEG study was done with scalp electrodes positioned according to the 10-20 International system of electrode placement. Electrical activity was acquired at a sampling rate of 500Hz  and reviewed with a high frequency filter of 70Hz  and a low frequency filter of 1Hz . EEG data were recorded continuously and digitally stored. Description: The posterior dominant rhythm consists of 8 Hz activity of moderate voltage (25-35 uV) seen predominantly in posterior head regions, symmetric and reactive to eye opening and eye closing. Hyperventilation and photic stimulation were not performed.   IMPRESSION: This study is within normal limits. No seizures or definite epileptiform discharges were seen throughout the recording. [name]   ECHOCARDIOGRAM COMPLETE  Result Date: 06/30/2020    ECHOCARDIOGRAM REPORT   Patient Name:   Randy Merritt Date of Exam: 06/30/2020 Medical Rec #:  [id number]      Height:       68.0 in Accession #:    [id number]     Weight:       175.0 lb Date of Birth:  05-Oct-1941       BSA:          1.931 m Patient Age:    32 years       BP:           147/100 mmHg Patient Gender: M              HR:           82 bpm. Exam Location:  Inpatient Procedure: 2D Echo, Cardiac Doppler and Color Doppler Indications:    TIA  History:        Patient has no prior history of Echocardiogram examinations.                 Risk Factors:Dyslipidemia. No prior cardiac history.  Sonographer:    Roseanna Rainbow RDCS Referring Phys: [place]  1. Left ventricular ejection fraction, by estimation, is 70 to 75%. The left ventricle has hyperdynamic function. The left ventricle has no regional wall motion abnormalities. There is mild left ventricular hypertrophy. Left ventricular diastolic parameters are  indeterminate.  2. Right ventricular systolic function is normal. The right ventricular size is normal.  3. The mitral valve is normal in structure. Trivial mitral valve regurgitation.  4. The aortic valve is normal in structure. Aortic valve regurgitation is trivial.  5. Aortic dilatation noted. 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  6. The inferior vena cava is normal in size with greater than 50% respiratory variability, suggesting right atrial pressure of 3 mmHg. FINDINGS  Left Ventricle: Left ventricular ejection fraction, by estimation, is 70 to 75%. The left ventricle has hyperdynamic function. The left ventricle has no regional wall motion abnormalities. The left ventricular internal cavity size was normal in size. There is mild left ventricular hypertrophy. Left ventricular diastolic parameters are indeterminate. Right Ventricle: The right ventricular size is normal. Right vetricular wall thickness was not assessed. Right ventricular systolic function is normal. Left Atrium: Left atrial size was normal in size. Right Atrium: Right atrial size was normal in size. Pericardium: Trivial pericardial effusion is present. Mitral Valve: The mitral valve is normal in structure. Trivial mitral valve regurgitation. Tricuspid Valve: The tricuspid valve  [name] Date: 8/19/ 2021 Duration: 26.08 minutes Patient history:  79 year old male presented with waxing and waning sensory and expressive aphasia for more than a month.  EEG evaluate for seizures. Level of alertness: Awake AEDs during EEG study: None Technical aspects: This EEG study was done with scalp electrodes positioned according to the 10-20 International system of electrode placement. Electrical activity was acquired at a sampling rate of 500Hz  and reviewed with a high frequency filter of 70Hz  and a low frequency filter of 1Hz . EEG data were recorded continuously and digitally stored. Description: The posterior dominant rhythm consists of 8 Hz activity of moderate voltage (25-35 uV) seen predominantly in posterior head regions, symmetric and reactive to eye opening and eye closing. Hyperventilation and photic stimulation were not performed.   IMPRESSION: This study is within normal limits. No seizures or definite epileptiform discharges were seen throughout the recording. [name]   ECHOCARDIOGRAM COMPLETE  Result Date: 06/30/2020    ECHOCARDIOGRAM REPORT   Patient Name:   Randy Merritt Date of Exam: 06/30/2020 Medical Rec #:  [id number]      Height:       68.0 in Accession #:    [id number]     Weight:       175.0 lb Date of Birth:  05-Oct-1941       BSA:          1.931 m Patient Age:    32 years       BP:           147/100 mmHg Patient Gender: M              HR:           82 bpm. Exam Location:  Inpatient Procedure: 2D Echo, Cardiac Doppler and Color Doppler Indications:    TIA  History:        Patient has no prior history of Echocardiogram examinations.                 Risk Factors:Dyslipidemia. No prior cardiac history.  Sonographer:    Roseanna Rainbow RDCS Referring Phys: [place]  1. Left ventricular ejection fraction, by estimation, is 70 to 75%. The left ventricle has hyperdynamic function. The left ventricle has no regional wall motion abnormalities. There is mild left ventricular hypertrophy. Left ventricular diastolic parameters are  indeterminate.  2. Right ventricular systolic function is normal. The right ventricular size is normal.  3. The mitral valve is normal in structure. Trivial mitral valve regurgitation.  4. The aortic valve is normal in structure. Aortic valve regurgitation is trivial.  5. Aortic dilatation noted. 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  6. The inferior vena cava is normal in size with greater than 50% respiratory variability, suggesting right atrial pressure of 3 mmHg. FINDINGS  Left Ventricle: Left ventricular ejection fraction, by estimation, is 70 to 75%. The left ventricle has hyperdynamic function. The left ventricle has no regional wall motion abnormalities. The left ventricular internal cavity size was normal in size. There is mild left ventricular hypertrophy. Left ventricular diastolic parameters are indeterminate. Right Ventricle: The right ventricular size is normal. Right vetricular wall thickness was not assessed. Right ventricular systolic function is normal. Left Atrium: Left atrial size was normal in size. Right Atrium: Right atrial size was normal in size. Pericardium: Trivial pericardial effusion is present. Mitral Valve: The mitral valve is normal in structure. Trivial mitral valve regurgitation. Tricuspid Valve: The tricuspid valve  Physician Discharge Summary  Randy Merritt OFH:[id number] DOB: 05-31-1941 DOA: 06/29/2020  PCP: Shirline Frees, MD  Admit date: 06/29/2020 Discharge date: 07/01/2020  Admitted From: Home  Discharge disposition: Home with home health   Recommendations for Outpatient Follow-Up:    Follow up with your primary care provider in one week.   Check CBC, BMP, magnesium in the next visit  Follow-up with [facility] in 1 month. Internal referral has been made.  Follow-up with home physical therapy and speech therapy  Discharge Diagnosis:   Principal Problem:   Aphasia Active Problems:   Goiter Medication adverse effect   Discharge Condition: Improved.  Diet recommendation: Low sodium, heart healthy.  Wound care: None.  Code status: Full.   History of Present Illness:   Randy Andrewsis a 79 y.o.malewithhistory of goiter, GERD requiring previous esophageal dilatation, hyperlipidemia, BPH had iniitally gone to the urgent care with complaints of chest pain and was found to be confused and had difficulty bringing out words. Patient was finding it difficult to talk and remember things and chronological orders.In the ER, patient had MRI of the brain which did not show anything acute. Neurology on-call was consulted. Neurology recommended further work-up including MRI brain with and without contrast and admission for further observation. Patient did not pass swallow since he had difficulty swallowing from the goiter. Labs were largely unremarkable except for creatinine of 1.4 which appears to be at baseline when compared to last month. Platelets of 145 comparable to last month. High sensory troponin was 6. EKG was showing normal sinus rhythm.  Patient was then admitted to the hospital.   Hospital Course:   Following conditions were addressed during hospitalization as listed below,  Confusion, expressive aphasia.  Improved.  Likely medication adverse effect  from prednisone and muscle relaxant.  Initially, there was concern for possible TIA.  Neurology was consulted.  EEG was unremarkable.  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  Patient was seen by physical therapy who recommended home PT on discharge. Hemoglobin A1c of 6.1. COVID test was negative. Urine analysis was negative. TSH of 0.3.  Spoke with neurology prior to discharge  Dysphagia.  History of multinodular goiter.  Had seen ENT as outpatient for goitre. 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 Had esophageal dilation in the past.  Has been seen by speech therapy.  Recommend regular diet with thin consistency liquids.  Chest x-ray with no acute findings.  History of hyperlipidemia  On Lipitor.  Atypical chest pain  EKG was unremarkable.  Troponins negative.Will get a chest x-ray. Will trend cardiac markers.  Nodular goiter.  Follows up with endocrine as outpatient Dr Renato Shin.  Outpatient thyroid nuclear scan, CT soft tissue and double contrast esophagram has been planned.  Elevated creatinine. creatinine of 1.4 on presentation.  Received IV fluids.  Creatinine of 1.3 prior to discharge.  Disposition.  At this time, patient is stable for disposition. Spoke with the patient's wife on the phone and updated her about the clinical condition of the patient and the plan for disposition..  Medical Consultants:    Neurology  Procedures:    EEG Subjective:   Today, patient examined at bedside.  Much more coherent and logical and answering appropriately.  Discharge Exam:   Vitals:   07/01/20 0824 07/01/20 1207  BP: 139/84 (!) 155/89  Pulse: 82 79  [name] Date: 8/19/ 2021 Duration: 26.08 minutes Patient history:  79 year old male presented with waxing and waning sensory and expressive aphasia for more than a month.  EEG evaluate for seizures. Level of alertness: Awake AEDs during EEG study: None Technical aspects: This EEG study was done with scalp electrodes positioned according to the 10-20 International system of electrode placement. Electrical activity was acquired at a sampling rate of 500Hz  and reviewed with a high frequency filter of 70Hz  and a low frequency filter of 1Hz . EEG data were recorded continuously and digitally stored. Description: The posterior dominant rhythm consists of 8 Hz activity of moderate voltage (25-35 uV) seen predominantly in posterior head regions, symmetric and reactive to eye opening and eye closing. Hyperventilation and photic stimulation were not performed.   IMPRESSION: This study is within normal limits. No seizures or definite epileptiform discharges were seen throughout the recording. [name]   ECHOCARDIOGRAM COMPLETE  Result Date: 06/30/2020    ECHOCARDIOGRAM REPORT   Patient Name:   Randy Merritt Date of Exam: 06/30/2020 Medical Rec #:  [id number]      Height:       68.0 in Accession #:    [id number]     Weight:       175.0 lb Date of Birth:  05-Oct-1941       BSA:          1.931 m Patient Age:    32 years       BP:           147/100 mmHg Patient Gender: M              HR:           82 bpm. Exam Location:  Inpatient Procedure: 2D Echo, Cardiac Doppler and Color Doppler Indications:    TIA  History:        Patient has no prior history of Echocardiogram examinations.                 Risk Factors:Dyslipidemia. No prior cardiac history.  Sonographer:    Roseanna Rainbow RDCS Referring Phys: [place]  1. Left ventricular ejection fraction, by estimation, is 70 to 75%. The left ventricle has hyperdynamic function. The left ventricle has no regional wall motion abnormalities. There is mild left ventricular hypertrophy. Left ventricular diastolic parameters are  indeterminate.  2. Right ventricular systolic function is normal. The right ventricular size is normal.  3. The mitral valve is normal in structure. Trivial mitral valve regurgitation.  4. The aortic valve is normal in structure. Aortic valve regurgitation is trivial.  5. Aortic dilatation noted. 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  6. The inferior vena cava is normal in size with greater than 50% respiratory variability, suggesting right atrial pressure of 3 mmHg. FINDINGS  Left Ventricle: Left ventricular ejection fraction, by estimation, is 70 to 75%. The left ventricle has hyperdynamic function. The left ventricle has no regional wall motion abnormalities. The left ventricular internal cavity size was normal in size. There is mild left ventricular hypertrophy. Left ventricular diastolic parameters are indeterminate. Right Ventricle: The right ventricular size is normal. Right vetricular wall thickness was not assessed. Right ventricular systolic function is normal. Left Atrium: Left atrial size was normal in size. Right Atrium: Right atrial size was normal in size. Pericardium: Trivial pericardial effusion is present. Mitral Valve: The mitral valve is normal in structure. Trivial mitral valve regurgitation. Tricuspid Valve: The tricuspid valve  [name] Date: 8/19/ 2021 Duration: 26.08 minutes Patient history:  79 year old male presented with waxing and waning sensory and expressive aphasia for more than a month.  EEG evaluate for seizures. Level of alertness: Awake AEDs during EEG study: None Technical aspects: This EEG study was done with scalp electrodes positioned according to the 10-20 International system of electrode placement. Electrical activity was acquired at a sampling rate of 500Hz  and reviewed with a high frequency filter of 70Hz  and a low frequency filter of 1Hz . EEG data were recorded continuously and digitally stored. Description: The posterior dominant rhythm consists of 8 Hz activity of moderate voltage (25-35 uV) seen predominantly in posterior head regions, symmetric and reactive to eye opening and eye closing. Hyperventilation and photic stimulation were not performed.   IMPRESSION: This study is within normal limits. No seizures or definite epileptiform discharges were seen throughout the recording. [name]   ECHOCARDIOGRAM COMPLETE  Result Date: 06/30/2020    ECHOCARDIOGRAM REPORT   Patient Name:   Randy Merritt Date of Exam: 06/30/2020 Medical Rec #:  [id number]      Height:       68.0 in Accession #:    [id number]     Weight:       175.0 lb Date of Birth:  05-Oct-1941       BSA:          1.931 m Patient Age:    32 years       BP:           147/100 mmHg Patient Gender: M              HR:           82 bpm. Exam Location:  Inpatient Procedure: 2D Echo, Cardiac Doppler and Color Doppler Indications:    TIA  History:        Patient has no prior history of Echocardiogram examinations.                 Risk Factors:Dyslipidemia. No prior cardiac history.  Sonographer:    Roseanna Rainbow RDCS Referring Phys: [place]  1. Left ventricular ejection fraction, by estimation, is 70 to 75%. The left ventricle has hyperdynamic function. The left ventricle has no regional wall motion abnormalities. There is mild left ventricular hypertrophy. Left ventricular diastolic parameters are  indeterminate.  2. Right ventricular systolic function is normal. The right ventricular size is normal.  3. The mitral valve is normal in structure. Trivial mitral valve regurgitation.  4. The aortic valve is normal in structure. Aortic valve regurgitation is trivial.  5. Aortic dilatation noted. 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  6. The inferior vena cava is normal in size with greater than 50% respiratory variability, suggesting right atrial pressure of 3 mmHg. FINDINGS  Left Ventricle: Left ventricular ejection fraction, by estimation, is 70 to 75%. The left ventricle has hyperdynamic function. The left ventricle has no regional wall motion abnormalities. The left ventricular internal cavity size was normal in size. There is mild left ventricular hypertrophy. Left ventricular diastolic parameters are indeterminate. Right Ventricle: The right ventricular size is normal. Right vetricular wall thickness was not assessed. Right ventricular systolic function is normal. Left Atrium: Left atrial size was normal in size. Right Atrium: Right atrial size was normal in size. Pericardium: Trivial pericardial effusion is present. Mitral Valve: The mitral valve is normal in structure. Trivial mitral valve regurgitation. Tricuspid Valve: The tricuspid valve

## 2020-07-01 NOTE — Evaluation (Signed)
Speech Language Pathology Evaluation Patient Details Name: Randy Merritt MRN: [id number] DOB: [birth year] Today's Date: 07/01/2020 Time: 2707-8675 SLP Time Calculation (min) (ACUTE ONLY): 21 min  Problem List:  Patient Active Problem List   Diagnosis Date Noted  . TIA (transient ischemic attack) 06/29/2020  . Goiter 12/24/2019  . Perforated sigmoid colon ([place]) 10/06/2019  . Ingrown toenail 10/11/2017  . Chondrodermatitis nodularis helicis of right ear 44/92/0100  . Oropharyngeal dysphagia 02/02/2017  . Chronic rhinitis 02/01/2017  . Gastroesophageal reflux disease 02/01/2017   Past Medical History:  Past Medical History:  Diagnosis Date  . Acid reflux   . Anxiety   . Arthritis   . Depression    " a little"  . Goiter   . High cholesterol   . History of blood transfusion   . Perforated bowel ([place])   . Seasonal allergies    Past Surgical History:  Past Surgical History:  Procedure Laterality Date  . ANTERIOR CERVICAL DECOMP/DISCECTOMY FUSION N/A 04/08/2013   Procedure: ANTERIOR CERVICAL DECOMPRESSION/DISCECTOMY FUSION 1 LEVEL;  Surgeon: Floyce Stakes, MD;  Location: [facility];  Service: Neurosurgery;  Laterality: N/A;  Cervical five-six Anterior cervical decompression/diskectomy fusion  . APPENDECTOMY N/A 10/06/2019   Procedure: APPENDECTOMY;  Surgeon: Clovis Riley, MD;  Location: [place];  Service: General;  Laterality: N/A;  . BIOPSY  01/30/2019   Procedure: BIOPSY;  Surgeon: Carol Ada, MD;  Location: [facility];  Service: Endoscopy;;  . CERVICAL DISCECTOMY     x2  . CHOLECYSTECTOMY    . COLON RESECTION SIGMOID N/A 10/06/2019   Procedure: COLON RESECTION SIGMOID;  Surgeon: Clovis Riley, MD;  Location: [place];  Service: General;  Laterality: N/A;  . ESOPHAGEAL DILATION  01/30/2019   Procedure: ESOPHAGEAL DILATION;  Surgeon: Carol Ada, MD;  Location: [facility];  Service: Endoscopy;;  [place]  . ESOPHAGOGASTRODUODENOSCOPY (EGD) WITH PROPOFOL N/A  01/30/2019   Procedure: ESOPHAGOGASTRODUODENOSCOPY (EGD) WITH PROPOFOL;  Surgeon: Carol Ada, MD;  Location: [facility];  Service: Endoscopy;  Laterality: N/A;  . gallstone removal    . LAPAROTOMY N/A 10/06/2019   Procedure: EXPLORATORY LAPAROTOMY;  Surgeon: Clovis Riley, MD;  Location: [facility];  Service: General;  Laterality: N/A;   HPI:  79 y.o. male with history of goiter, acid reflux requiring previous esophageal dilatation, hyperlipidemia BPH had gone to the urgent care with complaints of chest pain and was found to be confused and had difficulty bringing out words.  Patient states he has been having confusion and difficulty bringing up words for most a month. MRI of head unremarkable; chronic infarcts noted.    Assessment / Plan / Recommendation Clinical Impression   Pt presents with inconsistent cognitive-linguistic challenges throughout therapy session.  Pt was oriented to place and date (within 1-2 days) with increased time and would have moments where his language and mentation were clear and fluent.  However, pt also had moments where he appeared more suspicious and restless and would sit up and look around room at which point his speech would become more confused and tangential.  Pt's mentation made formal assessment of cognition and language difficult, but informally pt presents with decreased storage and retrieval of information, decreased thought organization, decreased sustained attention to tasks, and intermittent word finding difficulty with phonemic paraphasias and halting, dysfluent speech patterns.  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  Difficult to get a clear clinical picture of  pt given his inconsistencies on assessment and he is still undergoing work up for etiology of AMS and language deficits (MRI negative for acute event).   As a result, pt would benefit from skilled ST while  hospitalized in order to maximize functional independence and reduce burden of care prior to discharge.     SLP Assessment  SLP Recommendation/Assessment: Patient needs continued Speech Lanaguage Pathology Services SLP Visit Diagnosis: Cognitive communication deficit (R41.841)    Follow Up Recommendations  Home health SLP;24 hour supervision/assistance    Frequency and Duration min 1 x/week         SLP Evaluation Cognition  Overall Cognitive Status: Impaired/Different from baseline Arousal/Alertness: Awake/alert Orientation Level: Oriented to person;Oriented to place;Oriented to time Attention: Sustained Sustained Attention: Impaired Sustained Attention Impairment: Verbal basic;Functional basic Memory: Impaired Memory Impairment: Storage deficit;Retrieval deficit Awareness: Impaired Awareness Impairment: Intellectual impairment Problem Solving: Impaired Problem Solving Impairment: Functional basic;Verbal basic Behaviors: Restless Safety/Judgment: Impaired       Comprehension  Auditory Comprehension Overall Auditory Comprehension: Appears within functional limits for tasks assessed    Expression Expression Primary Mode of Expression: Verbal Verbal Expression Overall Verbal Expression: Impaired Initiation: No impairment Level of Generative/Spontaneous Verbalization: Conversation Naming: Impairment Responsive: 76-100% accurate Confrontation: Within functional limits Verbal Errors: Phonemic paraphasias Pragmatics: Impairment Impairments: Topic maintenance Effective Techniques: Semantic cues Written Expression Dominant Hand: Right   Oral / Motor  Oral Motor/Sensory Function Overall Oral Motor/Sensory Function: Within functional limits Motor Speech Overall Motor Speech: Appears within functional limits for tasks assessed   GO                    Emilio Math 07/01/2020, 11:25 AM

## 2020-07-01 NOTE — Progress Notes (Signed)
Maintenance completed; reprepped P3, O2, F4, and checked under F8. No skin breakdown was seen.

## 2020-07-01 NOTE — TOC Transition Note (Signed)
Transition of Care [facility]) - CM/SW Discharge Note   Patient Details  Name: Dwayne Bulkley MRN: [id number] Date of Birth: [birth year]  Transition of [facility]) CM/SW Contact:  Pollie Friar, RN Phone Number: 07/01/2020, 3:58 PM   Clinical Narrative:    Pt discharging home with [facility] services through [place]. No DME needs.  Wife to provide supervision at home and transportation to home.    Final next level of care: Home w Home Health Services Barriers to Discharge: No Barriers Identified   Patient Goals and CMS Choice   CMS Medicare.gov Compare Post Acute Care list provided to:: Patient Represenative (must comment) Choice offered to / list presented to : Spouse  Discharge Placement                       Discharge Plan and Services                          HH Arranged: PT, Speech Therapy [place] Agency: [place] ([facility]) Date [facility] Agency Contacted: 07/01/20   Representative spoke with at [place]: [place] Determinants of Health ([place]) Interventions     Readmission Risk Interventions No flowsheet data found.

## 2020-07-01 NOTE — Progress Notes (Signed)
Discharged to home. Discharge instructions given to patient and wife.

## 2020-07-01 NOTE — Evaluation (Signed)
Physical Therapy Evaluation Patient Details Name: Randy Merritt MRN: [id number] DOB: September 02, 1941 Today's Date: 07/01/2020   History of Present Illness  Randy Merritt is a 79 y.o. 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  Pt undergoing further testing.  Pt at supervision level or better at this time, but could benefit from further higher level activity for continued safety/conditioning. .  Pt currently limited functionally due to the problems listed below.  (see problems list.)  Pt will benefit from PT to maximize function and safety to be able to get home safely with available assist.     Follow Up Recommendations Home health PT;Supervision/Assistance - 24 hour    Equipment Recommendations  None recommended by PT    Recommendations for Other Services       Precautions / Restrictions Precautions Precautions: Fall Restrictions Weight Bearing Restrictions: No      Mobility  Bed Mobility Overal bed mobility: Needs Assistance Bed Mobility: Supine to Sit;Sit to Supine     Supine to sit: Supervision;HOB elevated Sit to supine: Supervision      Transfers Overall transfer level: Needs assistance Equipment used: None Transfers: Sit to/from Stand Sit to Stand: Supervision         General transfer comment: "Don't let me fall"  Ambulation/Gait Ambulation/Gait assistance: Supervision Gait Distance (Feet): 70 Feet (or more feet walking in the room limited by EEG lines/cords.) Assistive device: None Gait Pattern/deviations: Step-through pattern   Gait velocity interpretation: 1.31 - 2.62 ft/sec, indicative of  limited community ambulator General Gait Details: generally steady, but as speech and though processes are halted and uncoordinated, so is his movement around the room.  It appears similar to anxiety.  Pt able to scan, change direction, back up without overt LOB.  Stairs            Wheelchair Mobility    Modified Rankin (Stroke Patients Only) Modified Rankin (Stroke Patients Only) Pre-Morbid Rankin Score: Slight disability Modified Rankin: Slight disability     Balance Overall balance assessment: Needs assistance Sitting-balance support: No upper extremity supported;Feet supported Sitting balance-Leahy Scale: Good     Standing balance support: Single extremity supported Standing balance-Leahy Scale: Poor Standing balance comment: Used some BERG activites to determine that pt can work/reach outside his BOS without LOB, BUt with occaisional deviation.                 Standardized Balance Assessment Standardized Balance Assessment : Berg Balance Test Berg Balance Test Sit to Stand: Able to stand without using hands and stabilize independently Standing Unsupported: Able to stand 2 minutes with supervision Sitting with Back Unsupported but Feet Supported on Floor or Stool: Able to sit safely and securely 2 minutes Stand to Sit: Controls descent by using hands Transfers: Able to transfer safely, minor use of hands Standing Unsupported with Eyes Closed: Able to stand 10 seconds with supervision Standing Ubsupported with Feet Together: Able to place feet together independently and stand for 1 minute with supervision From Standing, Reach Forward with Outstretched Arm: Can reach forward >12 cm safely (5") From Standing Position, Pick up Object from Floor: Able to pick up shoe, needs supervision From  Standing Position, Turn to Look Behind Over each Shoulder: Looks behind one side only/other side shows less weight shift Turn 360 Degrees: Able to turn 360 degrees safely in 4  seconds or less         Pertinent Vitals/Pain      Home Living Family/patient expects to be discharged to:: Private residence Living Arrangements: Spouse/significant other Available Help at Discharge: Family;Available PRN/intermittently Type of Home: House Home Access: Stairs to enter Entrance Stairs-Rails: None Entrance Stairs-Number of Steps: 3 Home Layout: One level Home Equipment: None Additional Comments: patient very tangential when asked questions, answers he gave me are above but unsure of accuracy. He reports his wife works    Prior Function Level of Independence: Independent               Higher education careers adviser   Dominant Hand: Right    Extremity/Trunk Assessment   Upper Extremity Assessment Upper Extremity Assessment: Overall WFL for tasks assessed    Lower Extremity Assessment Lower Extremity Assessment: Overall WFL for tasks assessed (some mild coordination issues)       Communication   Communication: No difficulties (until the last month)  Cognition Arousal/Alertness: Awake/alert Behavior During Therapy: Restless Overall Cognitive Status: No family/caregiver present to determine baseline cognitive functioning                                 General Comments: pt still halted and unfocused of thought, but if slowed down, follows direction well and works well in a therapeutic environment      General Comments      Exercises     Assessment/Plan    PT Assessment Patient needs continued PT services  PT Problem List Decreased activity tolerance;Decreased balance;Decreased mobility;Decreased coordination;Decreased safety awareness       PT Treatment Interventions Gait training;Functional mobility training;Therapeutic activities;Balance training;Patient/family education    PT Goals (Current goals can be found in the Care Plan section)  Acute Rehab PT Goals Patient Stated Goal: get home PT Goal Formulation: With patient Time For Goal  Achievement: 07/08/20 Potential to Achieve Goals: Good    Frequency Min 3X/week   Barriers to discharge  (wife works, son in/out)      Co-evaluation               AM-PAC PT "6 Clicks" Mobility  Outcome Measure Help needed turning from your back to your side while in a flat bed without using bedrails?: None Help needed moving from lying on your back to sitting on the side of a flat bed without using bedrails?: None Help needed moving to and from a bed to a chair (including a wheelchair)?: None Help needed standing up from a chair using your arms (e.g., wheelchair or bedside chair)?: None Help needed to walk in hospital room?: None Help needed climbing 3-5 steps with a railing? : None 6 Click Score: 24    End of Session   Activity Tolerance: Patient tolerated treatment well Patient left: in bed;with call bell/phone within reach;with bed alarm set Nurse Communication: Mobility status PT Visit Diagnosis: Unsteadiness on feet (R26.81)    Time: 1610-9604 PT Time Calculation (min) (ACUTE ONLY): 23 min   Charges:   PT Evaluation $PT Eval Moderate Complexity: 1 Mod PT Treatments $Gait Training: 8-22 mins        07/01/2020  Jacinto Halim., PT Acute Rehabilitation Services [phone]  (pager) [phone]  (office)  Eliseo Gum Destyne Goodreau 07/01/2020, 10:57  AM

## 2020-07-01 NOTE — Procedures (Addendum)
Patient Name: Randy Merritt  MRN: [id number]  Epilepsy Attending: [name]  Referring Physician/Provider: [place] Cerise [name] Duration: 06/30/2020 1323 to 07/01/2020 1323  Patient history: 79 year old male presented with waxing and waning sensory and expressive aphasia for more than a month.  EEG evaluate for seizures.  Level of alertness: Awake, asleep  AEDs during EEG study: None  Technical aspects: This EEG study was done with scalp electrodes positioned according to the 10-20 International system of electrode placement. Electrical activity was acquired at a sampling rate of 500Hz  and reviewed with a high frequency filter of 70Hz  and a low frequency filter of 1Hz . EEG data were recorded continuously and digitally stored.   Description: The posterior dominant rhythm consists of 8 Hz activity of moderate voltage (25-35 uV) seen predominantly in posterior head regions, symmetric and reactive to eye opening and eye closing. Sleep was characterized by vertex waves, sleep spindles (12-14hz ), maximal bifrontal. Hyperventilation and photic stimulation were not performed.     IMPRESSION: This study is within normal limits. No seizures or definite epileptiform discharges were seen throughout the recording.  [name] [name]

## 2020-07-02 NOTE — Procedures (Signed)
Patient Name:Randy Merritt OIB:[id number] Epilepsy Attending:Addalyne Vandehei Randy Merritt Referring Physician/Provider:DesireeMetzger-Chilka Duration:07/01/2020 1323 to 07/01/2020 1726  Patient history:79 year old male presented with waxing and waning sensory and expressive aphasia for more than a month. EEG evaluate for seizures.  Level of alertness:Awake  AEDs during EEG study:None  Technical aspects: This EEG study was done with scalp electrodes positioned according to the 10-20 International system of electrode placement. Electrical activity was acquired at a sampling rate of 500Hz  and reviewed with a high frequency filter of 70Hz  and a low frequency filter of 1Hz . EEG data were recorded continuously and digitally stored.   Description: The posterior dominant rhythm consists of8Hz  activity of moderate voltage (25-35 uV) seen predominantly in posterior head regions, symmetric and reactive to eye opening and eye closing.  IMPRESSION: This study is within normal limits. No seizures ordefiniteepileptiform discharges were seen throughout the recording.  Randy Merritt Randy Merritt

## 2020-07-02 NOTE — Progress Notes (Signed)
Staff discontinued vLTM on evening of 07/01/20. Picked up equipment from nursing floor  07/02/20.  No skin breakdown noted at  [place]  FP2  F7  F8

## 2020-07-10 ENCOUNTER — Ambulatory Visit (HOSPITAL_COMMUNITY)
Admission: EM | Admit: 2020-07-10 | Discharge: 2020-07-10 | Disposition: A | Discharge status: Home / Self Care | Payer: 59 | Attending: Family Medicine | Admitting: Family Medicine

## 2020-07-10 ENCOUNTER — Encounter (HOSPITAL_COMMUNITY): Payer: Self-pay | Admitting: Emergency Medicine

## 2020-07-10 DIAGNOSIS — F4323 Adjustment disorder with mixed anxiety and depressed mood: Secondary | ICD-10-CM

## 2020-07-10 DIAGNOSIS — R1310 Dysphagia, unspecified: Secondary | ICD-10-CM | POA: Diagnosis present

## 2020-07-10 DIAGNOSIS — R0600 Dyspnea, unspecified: Secondary | ICD-10-CM

## 2020-07-10 LAB — POCT URINALYSIS DIPSTICK, ED / UC
Bilirubin Urine: NEGATIVE
Glucose, UA: NEGATIVE mg/dL
Leukocytes,Ua: NEGATIVE
Nitrite: NEGATIVE
Protein, ur: 100 mg/dL — AB
Specific Gravity, Urine: 1.025 (ref 1.005–1.030)
Urobilinogen, UA: 1 mg/dL (ref 0.0–1.0)
pH: 6 (ref 5.0–8.0)

## 2020-07-10 LAB — BASIC METABOLIC PANEL
Anion gap: 12 (ref 5–15)
BUN: 12 mg/dL (ref 8–23)
CO2: 26 mmol/L (ref 22–32)
Calcium: 9.8 mg/dL (ref 8.9–10.3)
Chloride: 102 mmol/L (ref 98–111)
Creatinine, Ser: 1.43 mg/dL — ABNORMAL HIGH (ref 0.61–1.24)
GFR calc Af Amer: 54 mL/min — ABNORMAL LOW (ref >60)
GFR calc non Af Amer: 46 mL/min — ABNORMAL LOW (ref >60)
Glucose, Bld: 103 mg/dL — ABNORMAL HIGH (ref 70–99)
Potassium: 4.3 mmol/L (ref 3.5–5.1)
Sodium: 140 mmol/L (ref 135–145)

## 2020-07-10 LAB — CBC
HCT: 41.8 % (ref 39.0–52.0)
Hemoglobin: 14 g/dL (ref 13.0–17.0)
MCH: 31.6 pg (ref 26.0–34.0)
MCHC: 33.5 g/dL (ref 30.0–36.0)
MCV: 94.4 fL (ref 80.0–100.0)
Platelets: 192 10*3/uL (ref 150–400)
RBC: 4.43 MIL/uL (ref 4.22–5.81)
RDW: 11.9 % (ref 11.5–15.5)
WBC: 3.2 10*3/uL — ABNORMAL LOW (ref 4.0–10.5)
nRBC: 0 % (ref 0.0–0.2)

## 2020-07-10 MED ORDER — ALPRAZOLAM 0.5 MG PO TABS: 0.5000 mg | ORAL_TABLET | Freq: Two times a day (BID) | ORAL | 0 refills | Status: DC | PRN

## 2020-07-10 NOTE — ED Provider Notes (Signed)
[place]    CSN: [id number] Arrival date & time: 07/10/20  1012      History   Chief Complaint Chief Complaint  Patient presents with  . Medication Reaction    HPI Randy Merritt is a 79 y.o. male.   HPI  Very complicated patient with multiple medical problems.  This is compounded by the fact that he has some speech difficulties and aphasia and swallowing difficulties that predate today's visit.  He was hospitalized 06/29/2020 for possible TIA and had multiple tests performed to evaluate multiple symptoms he was having. Today he states that he is having increased difficulty swallowing.  States he was unable to eat all day yesterday.  He is only drinking small amounts of juice.  Perhaps 3 juice servings yesterday.  No solid food.  He took his pills with a sip of water today.  That is all easy today.  Every time he tries to swallow he feels like the food is not going to go down.  This is making him upset.  He states he was awake all last night feeling like he cannot swallow and feeling like he cannot breathe.  He is managing his own saliva not having to spit. At times he feels like he is short of breath.  At times he feels like his heart is pounding.  He states that he went to his primary care doctor and they started him on Lexapro 5 mg.  He states this made him have sweats and feel worse so he stopped it. His blood pressure is elevated today.  This may be a combination of not taking all of his medicines plus sleep deprivation plus anxiety. Medical records are reviewed. Recent testing is reviewed.  During his hospitalization he had multiple imaging procedures performed, cardiac/echocardiogram performed.  He is scheduled to follow-up with ENT for a goiter which is thought to cause his difficulty swallowing, with neurology for possible TIA stroke symptoms, and with his primary care doctor.  Past Medical History:  Diagnosis Date  . Acid reflux   . Anxiety   . Arthritis   .  Depression    " a little"  . Goiter   . High cholesterol   . History of blood transfusion   . Perforated bowel ([place])   . Seasonal allergies     Patient Active Problem List   Diagnosis Date Noted  . Aphasia 07/01/2020  . TIA (transient ischemic attack) 06/29/2020  . Goiter 12/24/2019  . Perforated sigmoid colon ([place]) 10/06/2019  . Ingrown toenail 10/11/2017  . Chondrodermatitis nodularis helicis of right ear 26/83/4196  . Oropharyngeal dysphagia 02/02/2017  . Chronic rhinitis 02/01/2017  . Gastroesophageal reflux disease 02/01/2017    Past Surgical History:  Procedure Laterality Date  . ANTERIOR CERVICAL DECOMP/DISCECTOMY FUSION N/A 04/08/2013   Procedure: ANTERIOR CERVICAL DECOMPRESSION/DISCECTOMY FUSION 1 LEVEL;  Surgeon: Floyce Stakes, MD;  Location: [facility];  Service: Neurosurgery;  Laterality: N/A;  Cervical five-six Anterior cervical decompression/diskectomy fusion  . APPENDECTOMY N/A 10/06/2019   Procedure: APPENDECTOMY;  Surgeon: Clovis Riley, MD;  Location: [place];  Service: General;  Laterality: N/A;  . BIOPSY  01/30/2019   Procedure: BIOPSY;  Surgeon: Carol Ada, MD;  Location: [facility];  Service: Endoscopy;;  . CERVICAL DISCECTOMY     x2  . CHOLECYSTECTOMY    . COLON RESECTION SIGMOID N/A 10/06/2019   Procedure: COLON RESECTION SIGMOID;  Surgeon: Clovis Riley, MD;  Location: [place];  Service: General;  Laterality:  N/A;  . ESOPHAGEAL DILATION  01/30/2019   Procedure: ESOPHAGEAL DILATION;  Surgeon: Carol Ada, MD;  Location: [facility];  Service: Endoscopy;;  [place]  . ESOPHAGOGASTRODUODENOSCOPY (EGD) WITH PROPOFOL N/A 01/30/2019   Procedure: ESOPHAGOGASTRODUODENOSCOPY (EGD) WITH PROPOFOL;  Surgeon: Carol Ada, MD;  Location: [facility];  Service: Endoscopy;  Laterality: N/A;  . gallstone removal    . LAPAROTOMY N/A 10/06/2019   Procedure: EXPLORATORY LAPAROTOMY;  Surgeon: Clovis Riley, MD;  Location: [facility];  Service: General;   Laterality: N/A;       Home Medications    Prior to Admission medications   Medication Sig Start Date End Date Taking? Authorizing Provider  acetaminophen (TYLENOL) 500 MG tablet Take 2 tablets (1,000 mg total) by mouth every 8 (eight) hours. 10/15/19   Earnstine Regal, PA-C  ALPRAZolam Duanne Moron) 0.5 MG tablet Take 1 tablet (0.5 mg total) by mouth 2 (two) times daily as needed for anxiety. 07/10/20   Raylene Everts, MD  alum & mag hydroxide-simeth (MAALOX/MYLANTA) 200-200-20 MG/5ML suspension Take 15 mLs by mouth every 6 (six) hours as needed for indigestion or heartburn. 06/19/19   Wieters, Hallie C, PA-C  aspirin EC 81 MG tablet Take 1 tablet (81 mg total) by mouth daily. 10/15/19   Earnstine Regal, PA-C  famotidine (PEPCID) 40 MG tablet 40 mg in the evening 04/05/20   Kozlow, Donnamarie Poag, MD  fluticasone [facility]) 50 MCG/ACT nasal spray 1 spray each nostril 1 time per day Patient taking differently: Place 1 spray into both nostrils daily.  04/05/20   Kozlow, Donnamarie Poag, MD  loratadine (CLARITIN) 10 MG tablet 1-2 tablets 1 time per day Patient taking differently: Take 10 mg by mouth daily as needed for allergies.  04/05/20   Kozlow, Donnamarie Poag, MD  montelukast (SINGULAIR) 10 MG tablet 1 tablet 1 time per day Patient taking differently: Take 10 mg by mouth daily.  04/05/20   Kozlow, Donnamarie Poag, MD  Multiple Vitamins-Iron (MULTIVITAMINS WITH IRON) TABS tablet You should get a Women's One a day vitamin with iron and take daily.  This will help with your low hemoglobin.  You can buy this at any drug store, and choose whatever brand you like. Patient taking differently: Take 1 tablet by mouth daily.  10/15/19   Earnstine Regal, PA-C  pantoprazole (PROTONIX) 40 MG tablet Take 1 tablet (40 mg total) by mouth 2 (two) times daily. 04/05/20   Kozlow, Donnamarie Poag, MD  rosuvastatin (CRESTOR) 10 MG tablet Take 10 mg by mouth daily. 03/01/20   [provider]    Family History Family History  Problem Relation Age of  Onset  . Healthy Mother   . Healthy Father   . Thyroid disease Neg Hx     Social History Social History   Tobacco Use  . Smoking status: Former Research scientist (life sciences)  . Smokeless tobacco: Never Used  Vaping Use  . Vaping Use: Never used  Substance Use Topics  . Alcohol use: No  . Drug use: No     Allergies   Patient has no known allergies.   Review of Systems Review of Systems See HPI  Physical Exam Triage Vital Signs ED Triage Vitals  Enc Vitals Group     BP 07/10/20 1046 (!) 189/90     Pulse Rate 07/10/20 1046 (!) 106     Resp 07/10/20 1046 18     Temp 07/10/20 1046 (!) 97.5 F (36.4 C)     Temp Source 07/10/20 1046 Oral  SpO2 07/10/20 1237 100 %     Weight --      Height --      Head Circumference --      Peak Flow --      Pain Score 07/10/20 1052 0     Pain Loc --      Pain Edu? --      Excl. in [place]? --    No data found.  Updated Vital Signs BP (!) 144/86 (BP Location: Left Arm)   Pulse 93   Temp (!) 97.5 F (36.4 C) (Oral)   Resp 18   SpO2 100%      Physical Exam Constitutional:      General: He is not in acute distress.    Appearance: He is well-developed and normal weight.     Comments: Mild dysarthria.  HENT:     Head: Normocephalic and atraumatic.     Nose: Nose normal.     Mouth/Throat:     Mouth: Mucous membranes are dry.     Pharynx: No posterior oropharyngeal erythema.  Eyes:     Conjunctiva/sclera: Conjunctivae normal.     Pupils: Pupils are equal, round, and reactive to light.  Neck:     Vascular: No carotid bruit.  Cardiovascular:     Rate and Rhythm: Regular rhythm. Tachycardia present.     Heart sounds: Normal heart sounds.  Pulmonary:     Effort: Pulmonary effort is normal. No respiratory distress.     Breath sounds: Normal breath sounds.  Abdominal:     General: There is no distension.     Palpations: Abdomen is soft.  Musculoskeletal:        General: Normal range of motion.     Cervical back: Normal range of motion.      Right lower leg: No edema.     Left lower leg: No edema.  Lymphadenopathy:     Cervical: No cervical adenopathy.  Skin:    General: Skin is warm and dry.  Neurological:     General: No focal deficit present.     Mental Status: He is alert.      UC Treatments / Results  Labs (all labs ordered are listed, but only abnormal results are displayed) Labs Reviewed  CBC - Abnormal; Notable for the following components:      Result Value   WBC 3.2 (*)    All other components within normal limits  BASIC METABOLIC PANEL - Abnormal; Notable for the following components:   Glucose, Bld 103 (*)    Creatinine, Ser 1.43 (*)    GFR calc non Af Amer 46 (*)    GFR calc Af Amer 54 (*)    All other components within normal limits  POCT URINALYSIS DIPSTICK, ED / UC - Abnormal; Notable for the following components:   Ketones, ur TRACE (*)    Hgb urine dipstick TRACE (*)    Protein, ur 100 (*)    All other components within normal limits    EKG   Radiology No results found.  Procedures Procedures (including critical care time)  Medications Ordered in UC Medications - No data to display  Initial Impression / Assessment and Plan / UC Course  I have reviewed the triage vital signs and the nursing notes.  Pertinent labs & imaging results that were available during my care of the patient were reviewed by me and considered in my medical decision making (see chart for details).  Lab work reveals reasonably normal CBC.  The creatinine is mildly elevated, similar to his hospitalization.  I let him know he needs to drink more.  I believe his swallowing difficulty is being exacerbated by anxiety over his medical condition.  I looked at his past history of controlled substances to see that he was on chlordiazepoxide for many years.  This was stopped in May.  He states he did feel better when he took "Valium".  I have concerned that he may have had dependence on this medicine although he states he did  not.  In any event given his severity of symptoms I believe a 2-week supply of Xanax will not do any harm, and potentially help him sleep better and cope with his swallowing difficulties until he can see ENT on 07/27/2020 Final Clinical Impressions(s) / UC Diagnoses   Final diagnoses:  Dysphagia, unspecified type  Adjustment disorder with mixed anxiety and depressed mood  Dyspnea, unspecified type     Discharge Instructions     Try to drink more water/juice/fluids Drink Ensure at least twice a day Take alprazolam (Xanax) at night.  May take during the day if needed.  No more than 2 pills a day. This will not be refilled.  Discuss with your primary care doctor Call Dr. Kenton Kingfisher if not improving in a couple of days    ED Prescriptions    Medication Sig Dispense Auth. Provider   ALPRAZolam Duanne Moron) 0.5 MG tablet Take 1 tablet (0.5 mg total) by mouth 2 (two) times daily as needed for anxiety. 30 tablet Raylene Everts, MD     I have reviewed the PDMP during this encounter.   Raylene Everts, MD 07/10/20 1901

## 2020-07-10 NOTE — ED Triage Notes (Signed)
Pt presents to urgent care with problems with speech, he states last night he broke out in a sweat and his heart felt like it was racing. He states he recently started escitalopram but is unable to give more details. He states he feels like he cannot swallow.

## 2020-07-10 NOTE — Discharge Instructions (Addendum)
Try to drink more water/juice/fluids Drink Ensure at least twice a day Take alprazolam (Xanax) at night.  May take during the day if needed.  No more than 2 pills a day. This will not be refilled.  Discuss with your primary care doctor Call Dr. Kenton Kingfisher if not improving in a couple of days

## 2020-09-05 ENCOUNTER — Ambulatory Visit: Payer: 59 | Admitting: Neurology

## 2020-09-10 IMAGING — CR DG ABDOMEN 1V
1 series · 1 of 1 positions shown · non-contrast
Comparison: Chest radiograph 10/06/2019, abdominal radiograph
01/16/2019

CLINICAL DATA: Ileus following gastrointestinal surgery.

EXAM:
ABDOMEN - 1 VIEW

[abdomen kub]
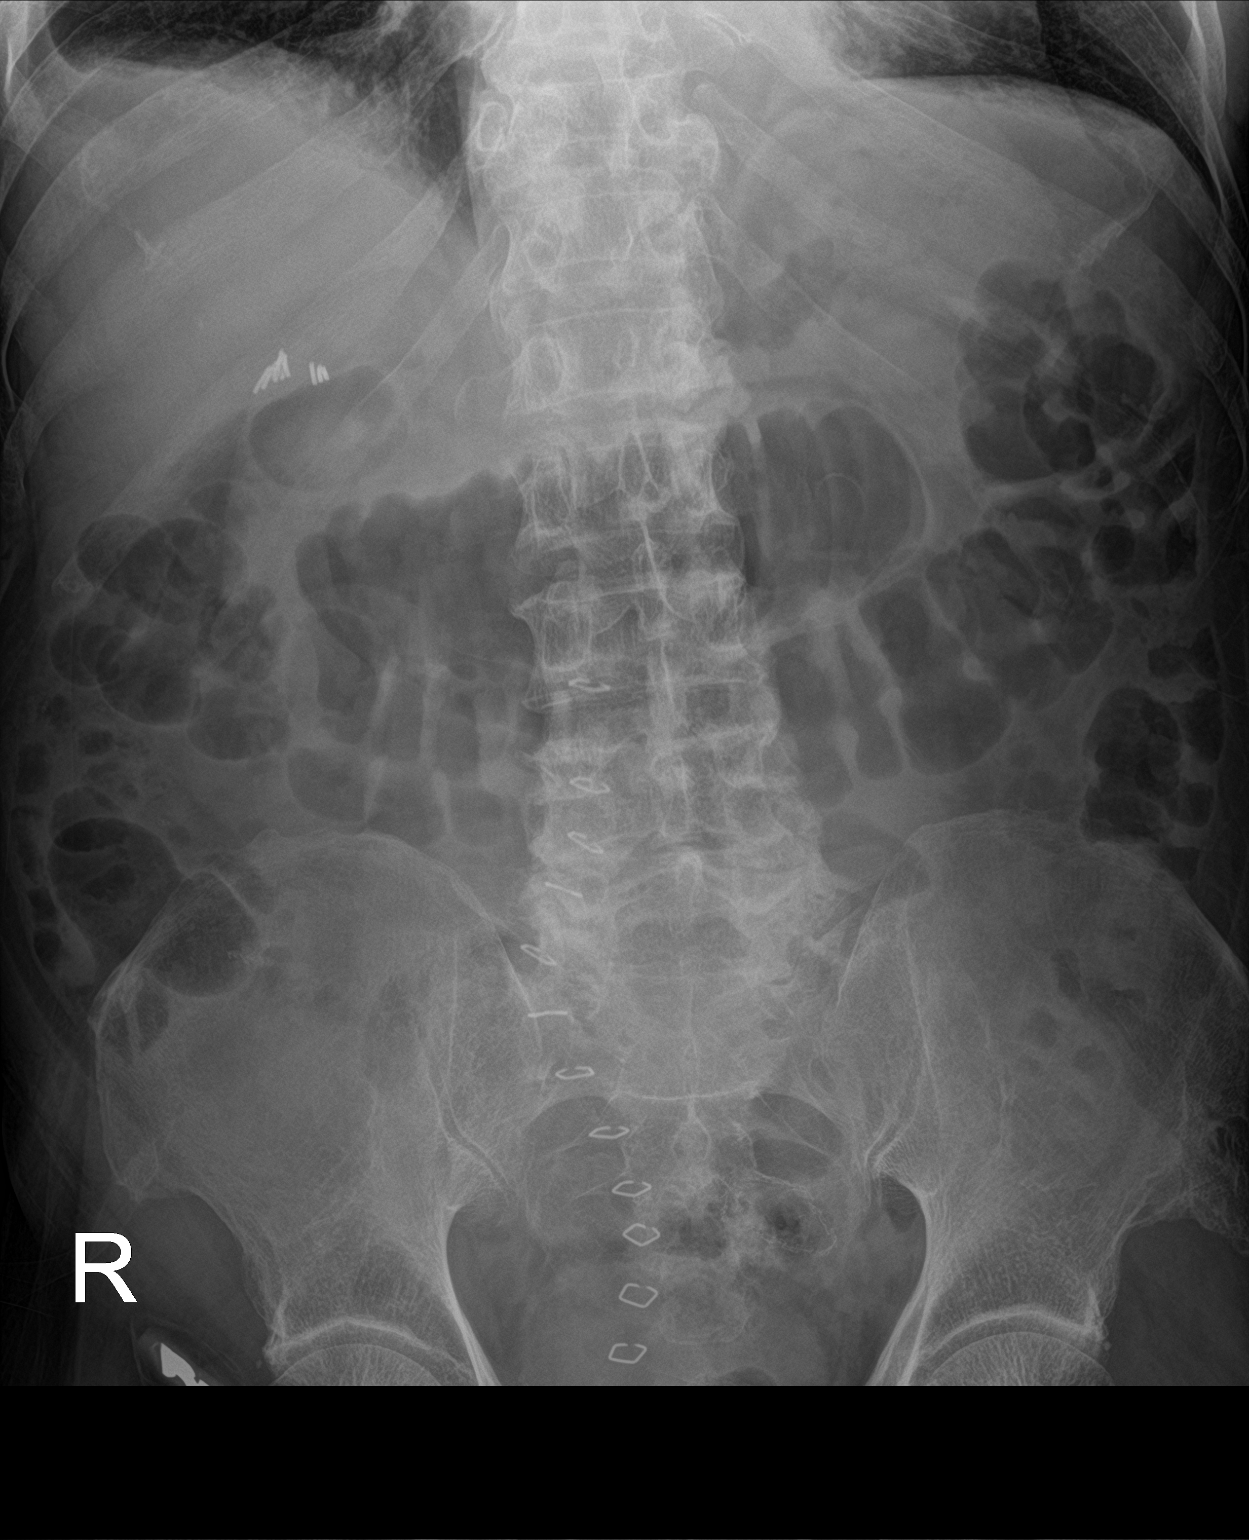

[1 of 1 positions shown; findings below may reference images not displayed]

FINDINGS: There is air distention of predominantly large bowel. No dilated
loops of small bowel are demonstrated to suggest obstruction.
Staples project over the midline abdomen. Surgical clips project
over the right upper quadrant. No acute bony abnormality.
IMPRESSION: Air distention of predominantly large bowel, most consistent with
ileus. Consider continued radiographic follow-up if concern for
developing bowel obstruction.

## 2020-09-13 ENCOUNTER — Ambulatory Visit: Payer: 59 | Admitting: Neurology

## 2020-09-21 ENCOUNTER — Other Ambulatory Visit: Payer: Self-pay | Admitting: Internal Medicine

## 2020-09-21 DIAGNOSIS — R1310 Dysphagia, unspecified: Secondary | ICD-10-CM

## 2020-09-21 DIAGNOSIS — E042 Nontoxic multinodular goiter: Secondary | ICD-10-CM

## 2020-10-03 ENCOUNTER — Ambulatory Visit
Admission: RE | Admit: 2020-10-03 | Discharge: 2020-10-03 | Disposition: A | Discharge status: Home / Self Care | Payer: 59 | Source: Ambulatory Visit | Attending: Internal Medicine | Admitting: Internal Medicine

## 2020-10-03 DIAGNOSIS — R1310 Dysphagia, unspecified: Secondary | ICD-10-CM

## 2020-10-03 DIAGNOSIS — E042 Nontoxic multinodular goiter: Secondary | ICD-10-CM

## 2020-10-08 ENCOUNTER — Other Ambulatory Visit: Payer: Self-pay

## 2020-10-08 ENCOUNTER — Emergency Department (HOSPITAL_COMMUNITY)
Admission: EM | Admit: 2020-10-08 | Discharge: 2020-10-08 | Disposition: A | Discharge status: Home / Self Care | Payer: 59 | Attending: Emergency Medicine | Admitting: Emergency Medicine

## 2020-10-08 ENCOUNTER — Emergency Department (HOSPITAL_COMMUNITY): Payer: 59

## 2020-10-08 DIAGNOSIS — Z8673 Personal history of transient ischemic attack (TIA), and cerebral infarction without residual deficits: Secondary | ICD-10-CM | POA: Diagnosis not present

## 2020-10-08 DIAGNOSIS — K219 Gastro-esophageal reflux disease without esophagitis: Secondary | ICD-10-CM | POA: Insufficient documentation

## 2020-10-08 DIAGNOSIS — R1084 Generalized abdominal pain: Secondary | ICD-10-CM

## 2020-10-08 DIAGNOSIS — Z7982 Long term (current) use of aspirin: Secondary | ICD-10-CM | POA: Diagnosis not present

## 2020-10-08 DIAGNOSIS — D72819 Decreased white blood cell count, unspecified: Secondary | ICD-10-CM | POA: Diagnosis not present

## 2020-10-08 DIAGNOSIS — R7989 Other specified abnormal findings of blood chemistry: Secondary | ICD-10-CM | POA: Insufficient documentation

## 2020-10-08 DIAGNOSIS — R1310 Dysphagia, unspecified: Secondary | ICD-10-CM | POA: Diagnosis not present

## 2020-10-08 DIAGNOSIS — Z87891 Personal history of nicotine dependence: Secondary | ICD-10-CM | POA: Insufficient documentation

## 2020-10-08 LAB — COMPREHENSIVE METABOLIC PANEL
ALT: 23 U/L (ref 0–44)
AST: 28 U/L (ref 15–41)
Albumin: 5 g/dL (ref 3.5–5.0)
Alkaline Phosphatase: 115 U/L (ref 38–126)
Anion gap: 9 (ref 5–15)
BUN: 15 mg/dL (ref 8–23)
CO2: 27 mmol/L (ref 22–32)
Calcium: 9.6 mg/dL (ref 8.9–10.3)
Chloride: 103 mmol/L (ref 98–111)
Creatinine, Ser: 1.6 mg/dL — ABNORMAL HIGH (ref 0.61–1.24)
GFR, Estimated: 44 mL/min — ABNORMAL LOW (ref >60)
Glucose, Bld: 110 mg/dL — ABNORMAL HIGH (ref 70–99)
Potassium: 4.1 mmol/L (ref 3.5–5.1)
Sodium: 139 mmol/L (ref 135–145)
Total Bilirubin: 1 mg/dL (ref 0.3–1.2)
Total Protein: 8 g/dL (ref 6.5–8.1)

## 2020-10-08 LAB — CBC WITH DIFFERENTIAL/PLATELET
Abs Immature Granulocytes: 0.01 10*3/uL (ref 0.00–0.07)
Basophils Absolute: 0 10*3/uL (ref 0.0–0.1)
Basophils Relative: 0 %
Eosinophils Absolute: 0 10*3/uL (ref 0.0–0.5)
Eosinophils Relative: 1 %
HCT: 43 % (ref 39.0–52.0)
Hemoglobin: 14.4 g/dL (ref 13.0–17.0)
Immature Granulocytes: 0 %
Lymphocytes Relative: 38 %
Lymphs Abs: 1.1 10*3/uL (ref 0.7–4.0)
MCH: 31.9 pg (ref 26.0–34.0)
MCHC: 33.5 g/dL (ref 30.0–36.0)
MCV: 95.3 fL (ref 80.0–100.0)
Monocytes Absolute: 0.3 10*3/uL (ref 0.1–1.0)
Monocytes Relative: 8 %
Neutro Abs: 1.5 10*3/uL — ABNORMAL LOW (ref 1.7–7.7)
Neutrophils Relative %: 53 %
Platelets: 175 10*3/uL (ref 150–400)
RBC: 4.51 MIL/uL (ref 4.22–5.81)
RDW: 12.8 % (ref 11.5–15.5)
WBC: 3 10*3/uL — ABNORMAL LOW (ref 4.0–10.5)
nRBC: 0 % (ref 0.0–0.2)

## 2020-10-08 LAB — LIPASE, BLOOD: Lipase: 27 U/L (ref 11–51)

## 2020-10-08 LAB — URINALYSIS, ROUTINE W REFLEX MICROSCOPIC
Bacteria, UA: NONE SEEN
Bilirubin Urine: NEGATIVE
Glucose, UA: NEGATIVE mg/dL
Hgb urine dipstick: NEGATIVE
Ketones, ur: NEGATIVE mg/dL
Leukocytes,Ua: NEGATIVE
Nitrite: NEGATIVE
Protein, ur: 30 mg/dL — AB
Specific Gravity, Urine: 1.014 (ref 1.005–1.030)
pH: 6 (ref 5.0–8.0)

## 2020-10-08 MED ORDER — POLYETHYLENE GLYCOL 3350 17 GM/SCOOP PO POWD: 1.0000 | Freq: Once | ORAL | 0 refills | Status: AC

## 2020-10-08 MED ORDER — ONDANSETRON 4 MG PO TBDP: 4.0000 mg | ORAL_TABLET | Freq: Three times a day (TID) | ORAL | 0 refills | Status: DC | PRN

## 2020-10-08 NOTE — ED Notes (Signed)
Pt aware urine sample is needed; urinal at bedside.  

## 2020-10-08 NOTE — ED Triage Notes (Signed)
Patient presents to ED, unable to complete thoughts. Says his medication is "messing him up". Denies pain. Very nervous and unable to remain still in triage. Says bowels haven't been moving.

## 2020-10-08 NOTE — Discharge Instructions (Signed)
As discussed, your CT was reassuring. 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 Follow-up with your PCP in regards to your prostate. Have your PCP recheck your white blood cell count and creatinine within the next week.  I am sending you home with MiraLAX and Zofran as needed for constipation and nausea.  Take as prescribed.  I have also included the number for Dr. Wilburn Cornelia.  Please call to schedule appointment for further evaluation of your difficulties swallowing. Return to the ER for new or worsening symptoms.

## 2020-10-08 NOTE — ED Provider Notes (Signed)
[facility] DEPT Provider Note   CSN: [id number] Arrival date & time: 10/08/20  1004     History Chief Complaint  Patient presents with  . Anxiety  . Abdominal Pain  . Constipation    Randy Merritt is a 79 y.o. male with a past medical history significant for anxiety, depression, thyroid goiter, history of bowel perforation, and history of TIA who presents to the ED due to multiple complaints.  Patient admits to generalized abdominal "pressure".  He states he believes he is constipated because he has not had a bowel movement in over a week. He took a stool softener this morning with no relief. Abdominal pressure associated with mild abdominal distention. Denies nausea, vomiting, diarrhea. Denies fever and chills. No urinary symptoms. Patient has had a prior appendectomy and cholecystectomy.   Patient also is concerned about his dysphagia. Chart reviewed. 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 Patient saw Dr. Wilburn Cornelia with ENT on 7/21 where a laryngoscopy was performed which demonstrates normal vocal cords without mass, nodules or tumors. It did show pooling of secretions in the hypopharynx and post glottic erythema. Dr. Wilburn Cornelia recommended PPI therapy which patient has been compliant with. Patient denies any drooling and notes he is able to tolerate oral secretions. He states he has most difficulty with eating solids.   History obtained from patient and past medical records. No interpreter used during encounter.      Past Medical History:  Diagnosis Date  . Acid reflux   . Anxiety   . Arthritis   . Depression    " a little"  . Goiter   . High cholesterol   . History of blood transfusion   . Perforated bowel ([place])   . Seasonal allergies     Patient Active Problem List   Diagnosis Date Noted  . Aphasia 07/01/2020  . TIA (transient ischemic attack) 06/29/2020  . Goiter  12/24/2019  . Perforated sigmoid colon ([place]) 10/06/2019  . Ingrown toenail 10/11/2017  . Chondrodermatitis nodularis helicis of right ear 76/81/1572  . Oropharyngeal dysphagia 02/02/2017  . Chronic rhinitis 02/01/2017  . Gastroesophageal reflux disease 02/01/2017    Past Surgical History:  Procedure Laterality Date  . ANTERIOR CERVICAL DECOMP/DISCECTOMY FUSION N/A 04/08/2013   Procedure: ANTERIOR CERVICAL DECOMPRESSION/DISCECTOMY FUSION 1 LEVEL;  Surgeon: Floyce Stakes, MD;  Location: [facility];  Service: Neurosurgery;  Laterality: N/A;  Cervical five-six Anterior cervical decompression/diskectomy fusion  . APPENDECTOMY N/A 10/06/2019   Procedure: APPENDECTOMY;  Surgeon: Clovis Riley, MD;  Location: [place];  Service: General;  Laterality: N/A;  . BIOPSY  01/30/2019   Procedure: BIOPSY;  Surgeon: Carol Ada, MD;  Location: [facility];  Service: Endoscopy;;  . CERVICAL DISCECTOMY     x2  . CHOLECYSTECTOMY    . COLON RESECTION SIGMOID N/A 10/06/2019   Procedure: COLON RESECTION SIGMOID;  Surgeon: Clovis Riley, MD;  Location: [place];  Service: General;  Laterality: N/A;  . ESOPHAGEAL DILATION  01/30/2019   Procedure: ESOPHAGEAL DILATION;  Surgeon: Carol Ada, MD;  Location: [facility];  Service: Endoscopy;;  [place]  . ESOPHAGOGASTRODUODENOSCOPY (EGD) WITH PROPOFOL N/A 01/30/2019   Procedure: ESOPHAGOGASTRODUODENOSCOPY (EGD) WITH PROPOFOL;  Surgeon: Carol Ada, MD;  Location: [facility];  Service: Endoscopy;  Laterality: N/A;  . gallstone removal    . LAPAROTOMY N/A 10/06/2019   Procedure: EXPLORATORY LAPAROTOMY;  Surgeon: Clovis Riley, MD;  Location:  [facility];  Service: General;  Laterality: N/A;       Family History  Problem Relation Age of Onset  . Healthy Mother   . Healthy Father   . Thyroid disease Neg Hx     Social History   Tobacco Use  . Smoking status: Former Research scientist (life sciences)  . Smokeless tobacco: Never Used  Vaping Use  . Vaping Use: Never used    Substance Use Topics  . Alcohol use: No  . Drug use: No    Home Medications Prior to Admission medications   Medication Sig Start Date End Date Taking? Authorizing Provider  acetaminophen (TYLENOL) 500 MG tablet Take 2 tablets (1,000 mg total) by mouth every 8 (eight) hours. Patient taking differently: Take 1,000 mg by mouth every 8 (eight) hours as needed. pain 10/15/19  Yes Earnstine Regal, PA-C  amLODipine (NORVASC) 5 MG tablet Take 5 mg by mouth daily.   Yes [provider]  hydrOXYzine (ATARAX/VISTARIL) 25 MG tablet Take 25-50 mg by mouth at bedtime.   Yes [provider]  Multiple Vitamins-Iron (MULTIVITAMINS WITH IRON) TABS tablet You should get a Women's One a day vitamin with iron and take daily.  This will help with your low hemoglobin.  You can buy this at any drug store, and choose whatever brand you like. Patient taking differently: Take 1 tablet by mouth daily.  10/15/19  Yes Earnstine Regal, PA-C  pantoprazole (PROTONIX) 40 MG tablet Take 1 tablet (40 mg total) by mouth 2 (two) times daily. 04/05/20  Yes Kozlow, Donnamarie Poag, MD  rosuvastatin (CRESTOR) 10 MG tablet Take 10 mg by mouth daily. 03/01/20  Yes [provider]  ALPRAZolam Duanne Moron) 0.5 MG tablet Take 1 tablet (0.5 mg total) by mouth 2 (two) times daily as needed for anxiety. Patient not taking: Reported on 10/08/2020 07/10/20   Raylene Everts, MD  alum & mag hydroxide-simeth (MAALOX/MYLANTA) 200-200-20 MG/5ML suspension Take 15 mLs by mouth every 6 (six) hours as needed for indigestion or heartburn. Patient not taking: Reported on 10/08/2020 06/19/19   Wieters, Elesa Hacker, PA-C  aspirin EC 81 MG tablet Take 1 tablet (81 mg total) by mouth daily. Patient not taking: Reported on 10/08/2020 10/15/19   Earnstine Regal, PA-C  famotidine (PEPCID) 40 MG tablet 40 mg in the evening Patient not taking: Reported on 10/08/2020 04/05/20   Jiles Prows, MD  fluticasone [facility]) 50 MCG/ACT nasal spray 1  spray each nostril 1 time per day Patient not taking: Reported on 10/08/2020 04/05/20   Jiles Prows, MD  loratadine (CLARITIN) 10 MG tablet 1-2 tablets 1 time per day Patient not taking: Reported on 10/08/2020 04/05/20   Jiles Prows, MD  montelukast (SINGULAIR) 10 MG tablet 1 tablet 1 time per day Patient not taking: Reported on 10/08/2020 04/05/20   Jiles Prows, MD  ondansetron (ZOFRAN ODT) 4 MG disintegrating tablet Take 1 tablet (4 mg total) by mouth every 8 (eight) hours as needed for nausea or vomiting. 10/08/20   Suzy Bouchard, PA-C  polyethylene glycol powder (MIRALAX) 17 GM/SCOOP powder Take 255 g by mouth once for 1 dose. 10/08/20 10/08/20  Suzy Bouchard, PA-C    Allergies    Patient has no known allergies.  Review of Systems   Review of Systems  Constitutional: Negative for chills and fever.  HENT: Positive for trouble swallowing. Negative for voice change.   Gastrointestinal: Positive for abdominal pain and constipation. Negative for diarrhea, nausea and vomiting.  All other  systems reviewed and are negative.   Physical Exam Updated Vital Signs BP 138/83   Pulse 66   Temp 97.8 F (36.6 C) (Oral)   Resp (!) 23   Ht 5\' 8"  (1.727 m)   Wt 72.6 kg   SpO2 99%   BMI 24.33 kg/m   Physical Exam Vitals and nursing note reviewed.  Constitutional:      General: He is not in acute distress.    Appearance: He is not ill-appearing.  HENT:     Head: Normocephalic.     Mouth/Throat:     Comments: Airway patent. Tolerating oral secretions without difficulty. Eyes:     Pupils: Pupils are equal, round, and reactive to light.  Cardiovascular:     Rate and Rhythm: Normal rate and regular rhythm.     Pulses: Normal pulses.     Heart sounds: Normal heart sounds. No murmur heard.  No friction rub. No gallop.   Pulmonary:     Effort: Pulmonary effort is normal.     Breath sounds: Normal breath sounds.  Abdominal:     General: Abdomen is flat. Bowel sounds are  normal. There is distension.     Palpations: Abdomen is soft.     Tenderness: There is abdominal tenderness. There is no guarding or rebound.     Comments: Diffuse abdominal tenderness. No focal tenderness. Possible mild abdominal distention. Hypoactive BS.   Musculoskeletal:     Cervical back: Neck supple.     Comments: Able to move all 4 extremities without difficulty.   Skin:    General: Skin is warm and dry.  Neurological:     General: No focal deficit present.     Mental Status: He is alert.  Psychiatric:        Mood and Affect: Mood normal.        Behavior: Behavior normal.     ED Results / Procedures / Treatments   Labs (all labs ordered are listed, but only abnormal results are displayed) Labs Reviewed  CBC WITH DIFFERENTIAL/PLATELET - Abnormal; Notable for the following components:      Result Value   WBC 3.0 (*)    Neutro Abs 1.5 (*)    All other components within normal limits  COMPREHENSIVE METABOLIC PANEL - Abnormal; Notable for the following components:   Glucose, Bld 110 (*)    Creatinine, Ser 1.60 (*)    GFR, Estimated 44 (*)    All other components within normal limits  URINALYSIS, ROUTINE W REFLEX MICROSCOPIC - Abnormal; Notable for the following components:   Protein, ur 30 (*)    All other components within normal limits  LIPASE, BLOOD    EKG None  Radiology CT ABDOMEN PELVIS WO CONTRAST  Result Date: 10/08/2020 CLINICAL DATA:  Abdominal pain and distension EXAM: CT ABDOMEN AND PELVIS WITHOUT CONTRAST TECHNIQUE: Multidetector CT imaging of the abdomen and pelvis was performed following the standard protocol without IV contrast. COMPARISON:  May 24, 2020 FINDINGS: Lower chest: Lung bases are clear. Hepatobiliary: No focal liver lesions are evident on this noncontrast enhanced study. The gallbladder is absent. There is no appreciable biliary duct dilatation. Pancreas: No pancreatic mass or inflammatory focus. Spleen: No splenic lesions are evident.  Adrenals/Urinary Tract: Adrenals bilaterally appear normal. Slight right renal atrophy is stable. There is a 6 mm cyst along the lateral right kidney, stable. No evident hydronephrosis on either side. There is no evident renal or ureteral calculus on either side. Urinary bladder is midline with wall  thickness within normal limits. Stomach/Bowel: Postoperative change in the sigmoid colon with patent anastomosis, also present previously. Cecal diverticula without diverticulitis evident, a stable finding. No appreciable bowel wall or mesenteric thickening. Moderate stool in colon. No evident bowel obstruction. Terminal ileum appears normal. No appreciable free air or portal venous air. Vascular/Lymphatic: No abdominal aortic aneurysm. There are foci of aortic and iliac artery atherosclerosis. No adenopathy evident in the abdomen or pelvis. Reproductive: Prostate is again noted to be enlarged with loss of fat plane between the prostate and seminal vesicles. Mild prominence of the medial right seminal vesicle is stable. Other: Appendix region unremarkable without inflammatory change. No evident abscess or ascites in the abdomen or pelvis. Musculoskeletal: Fat noted in the left inguinal ring, stable. There is degenerative change in the lower thoracic and lumbar regions. Spinal stenosis noted at L4-5 due to disc protrusion and bony hypertrophy. Spurring noted along the left iliac crest, stable. Degenerative change in each hip joint noted. No intramuscular lesions. IMPRESSION: 1. No evident bowel wall thickening or bowel obstruction. Postoperative change in the sigmoid colon with patent anastomosis, also present previously. Occasional cecal diverticula without diverticulitis. No appendiceal region inflammation. No abscess in the abdomen or pelvis. 2. No evident renal or ureteral calculus. No hydronephrosis. Urinary bladder wall thickness normal. 3. Prostate again noted to be enlarged with loss of fat plane between the  prostate and inferior bladder. Prominence of the medial right seminal vesicle, stable. This appearance warrants clinical and PSA assessment. Direct visualization of the inferior aspect of the urinary bladder may be warranted depending on clinical and PSA findings. 4. 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 5. Aortic Atherosclerosis (ICD10-I70.0). There are foci of iliac artery atherosclerosis. 6.  Gallbladder absent. Electronically Signed   By: Lowella Grip III M.D.   On: 10/08/2020 12:12    Procedures Procedures (including critical care time)  Medications Ordered in ED Medications - No data to display  ED Course  I have reviewed the triage vital signs and the nursing notes.  Pertinent labs & imaging results that were available during my care of the patient were reviewed by me and considered in my medical decision making (see chart for details).  Clinical Course as of Oct 08 1345  Sat Oct 08, 2020  1149 WBC(!): 3.0 [CA]  1149 Creatinine(!): 1.60 [CA]  1305 Spoke to patient's wife on the phone and she notes patient is at his baseline.   [CA]    Clinical Course User Index [CA] Karie Kirks   MDM Rules/Calculators/A&P                         79 year old male presents to the ED due to abdominal pressure associated with decreased bowel movements.  Patient is also concerned about his dysphagia. He is currently follow-up Dr. Wilburn Cornelia and his dysphagia appears to be a chronic issue.  Upon arrival stable vitals.  Patient is afebrile, not tachycardic or hypoxic.  Patient in no acute distress and nontoxic-appearing.  Patient appears anxious during initial evaluation.  Physical exam significant for diffuse abdominal tenderness with possible mild distention.  Hypoactive bowel sounds.  Airway patent.  Patient tolerating oral secretions without difficulty.  Will obtain routine labs to rule out electrolyte abnormalities, infectious  etiology, and check renal function.  Also obtain CT abdomen to rule out any intraabdominal abnormalities.   CT abdomen personally reviewed which demonstrates: IMPRESSION:  1. No evident bowel wall thickening or bowel obstruction.  Postoperative change in the sigmoid colon with patent anastomosis,  also present previously. Occasional cecal diverticula without  diverticulitis. No appendiceal region inflammation. No abscess in  the abdomen or pelvis.    2. No evident renal or ureteral calculus. No hydronephrosis. Urinary  bladder wall thickness normal.    3. Prostate again noted to be enlarged with loss of fat plane  between the prostate and inferior bladder. Prominence of the medial  right seminal vesicle, stable. This appearance warrants clinical and  PSA assessment. Direct visualization of the inferior aspect of the  urinary bladder may be warranted depending on clinical and PSA  findings.    4. 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    5. Aortic Atherosclerosis (ICD10-I70.0). There are foci of iliac  artery atherosclerosis.    6. Gallbladder absent.   Lipase normal at 27.  Doubt pancreatitis.  UA significant for proteinuria, but no signs of infection or hematuria.  CMP significant for mild elevation creatinine at 1.6 and hyperglycemia at 110.  No anion gap.  Doubt DKA.  CBC significant for leukopenia at 3. Spoke to patient's wife on the phone who notes patient is at his baseline. Patient able to drink fluids with mild difficulty which through chart review appears to be his baseline. No signs of airway compromise. Suspect this is a longstanding issue that needs ENT follow-up. Will treat patient' symptoms with Miralax given decreased bowel movements. Instructed patient to follow-up with PCP to recheck WBC and creatinine level within the next week. Discussed with patient about his enlarged prostate which he was aware of. Instructed  him to follow-up with PCP in regards to enlarged prostate Strict ED precautions discussed with patient. Patient states understanding and agrees to plan. Patient discharged home in no acute distress and stable vitals  Discussed case with Dr. Roderic Palau who evaluated patient at bedside and agrees with assessment and plan.  Final Clinical Impression(s) / ED Diagnoses Final diagnoses:  Generalized abdominal pain  Leukopenia, unspecified type  Elevated serum creatinine  Dysphagia, unspecified type    Rx / DC Orders ED Discharge Orders         Ordered    ondansetron (ZOFRAN ODT) 4 MG disintegrating tablet  Every 8 hours PRN        10/08/20 1344    polyethylene glycol powder (MIRALAX) 17 GM/SCOOP powder   Once        10/08/20 [place], Kanin Lia C, PA-C 10/08/20 1347    Milton Ferguson, MD 10/09/20 [phone]

## 2020-10-12 ENCOUNTER — Other Ambulatory Visit: Payer: Self-pay | Admitting: Otolaryngology

## 2020-10-12 DIAGNOSIS — R1312 Dysphagia, oropharyngeal phase: Secondary | ICD-10-CM

## 2020-10-13 ENCOUNTER — Other Ambulatory Visit: Payer: Self-pay

## 2020-10-13 ENCOUNTER — Ambulatory Visit (HOSPITAL_COMMUNITY)
Admission: EM | Admit: 2020-10-13 | Discharge: 2020-10-13 | Disposition: A | Discharge status: Home / Self Care | Payer: 59 | Attending: Internal Medicine | Admitting: Internal Medicine

## 2020-10-13 ENCOUNTER — Encounter (HOSPITAL_COMMUNITY): Payer: Self-pay | Admitting: Emergency Medicine

## 2020-10-13 DIAGNOSIS — F411 Generalized anxiety disorder: Secondary | ICD-10-CM

## 2020-10-13 MED ORDER — HYDROXYZINE HCL 25 MG PO TABS: 25.0000 mg | ORAL_TABLET | Freq: Three times a day (TID) | ORAL | 0 refills | Status: AC | PRN

## 2020-10-13 NOTE — ED Triage Notes (Signed)
Patient has has chest tightness for a week and left hand numbness for a week.  Patient reports his breathing is different today, he feels sob. patient is able to have conversations, speaking in complete phrases.    Patient reports pcp told him to stop taking escitalopram.  Patient stopped taking this medicine one week ago, too Patient thinks his breathing changes when he takes this medicine

## 2020-10-13 NOTE — ED Provider Notes (Signed)
____________________________________________  Time seen: Approximately 7:27 PM  I have reviewed the triage vital signs and the nursing notes.   HISTORY  Chief Complaint Chest Pain and Shortness of Breath   Historian Patient     HPI Randy Merritt is a 79 y.o. male with a history of depression and anxiety, presents to the urgent care requesting anxiety medication.  Patient states that he has become increasingly anxious at home since spontaneously stopping Lexapro.  Patient states that when he is feeling anxious, he does experience chest tightness.  He denies current chest pain or shortness of breath.  No nausea, vomiting or abdominal pain.  Patient states that he has an appointment with his primary care provider on Monday to discuss his psychiatric medications.    Past Medical History:  Diagnosis Date  . Acid reflux   . Anxiety   . Arthritis   . Depression    " a little"  . Goiter   . High cholesterol   . History of blood transfusion   . Perforated bowel ([place])   . Seasonal allergies      Immunizations up to date:  Yes.     Past Medical History:  Diagnosis Date  . Acid reflux   . Anxiety   . Arthritis   . Depression    " a little"  . Goiter   . High cholesterol   . History of blood transfusion   . Perforated bowel ([place])   . Seasonal allergies     Patient Active Problem List   Diagnosis Date Noted  . Aphasia 07/01/2020  . TIA (transient ischemic attack) 06/29/2020  . Goiter 12/24/2019  . Perforated sigmoid colon ([place]) 10/06/2019  . Ingrown toenail 10/11/2017  . Chondrodermatitis nodularis helicis of right ear 78/58/8502  . Oropharyngeal dysphagia 02/02/2017  . Chronic rhinitis 02/01/2017  . Gastroesophageal reflux disease 02/01/2017    Past Surgical History:  Procedure Laterality Date  . ANTERIOR CERVICAL DECOMP/DISCECTOMY FUSION N/A 04/08/2013   Procedure: ANTERIOR CERVICAL DECOMPRESSION/DISCECTOMY FUSION 1 LEVEL;  Surgeon: Floyce Stakes, MD;   Location: [facility];  Service: Neurosurgery;  Laterality: N/A;  Cervical five-six Anterior cervical decompression/diskectomy fusion  . APPENDECTOMY N/A 10/06/2019   Procedure: APPENDECTOMY;  Surgeon: Clovis Riley, MD;  Location: [place];  Service: General;  Laterality: N/A;  . BIOPSY  01/30/2019   Procedure: BIOPSY;  Surgeon: Carol Ada, MD;  Location: [facility];  Service: Endoscopy;;  . CERVICAL DISCECTOMY     x2  . CHOLECYSTECTOMY    . COLON RESECTION SIGMOID N/A 10/06/2019   Procedure: COLON RESECTION SIGMOID;  Surgeon: Clovis Riley, MD;  Location: [place];  Service: General;  Laterality: N/A;  . ESOPHAGEAL DILATION  01/30/2019   Procedure: ESOPHAGEAL DILATION;  Surgeon: Carol Ada, MD;  Location: [facility];  Service: Endoscopy;;  [place]  . ESOPHAGOGASTRODUODENOSCOPY (EGD) WITH PROPOFOL N/A 01/30/2019   Procedure: ESOPHAGOGASTRODUODENOSCOPY (EGD) WITH PROPOFOL;  Surgeon: Carol Ada, MD;  [facility];  Service: Endoscopy;  Laterality: N/A;  . gallstone removal    . LAPAROTOMY N/A 10/06/2019   Procedure: EXPLORATORY LAPAROTOMY;  Surgeon: Clovis Riley, MD;  Location: [place];  Service: General;  Laterality: N/A;    Prior to Admission medications   Medication Sig Start Date End Date Taking? Authorizing Provider  amLODipine (NORVASC) 5 MG tablet Take 5 mg by mouth daily.   Yes [provider]  pantoprazole (PROTONIX) 40 MG tablet Take 1 tablet (40 mg total) by mouth 2 (two)  times daily. 04/05/20  Yes Kozlow, Donnamarie Poag, MD  rosuvastatin (CRESTOR) 10 MG tablet Take 10 mg by mouth daily. 03/01/20  Yes [provider]  acetaminophen (TYLENOL) 500 MG tablet Take 2 tablets (1,000 mg total) by mouth every 8 (eight) hours. Patient taking differently: Take 1,000 mg by mouth every 8 (eight) hours as needed. pain 10/15/19   Earnstine Regal, PA-C  ALPRAZolam Duanne Moron) 0.5 MG tablet Take 1 tablet (0.5 mg total) by mouth 2 (two) times daily as needed for  anxiety. Patient not taking: Reported on 10/08/2020 07/10/20   Raylene Everts, MD  alum & mag hydroxide-simeth (MAALOX/MYLANTA) 200-200-20 MG/5ML suspension Take 15 mLs by mouth every 6 (six) hours as needed for indigestion or heartburn. Patient not taking: Reported on 10/08/2020 06/19/19   Wieters, Elesa Hacker, PA-C  aspirin EC 81 MG tablet Take 1 tablet (81 mg total) by mouth daily. Patient not taking: Reported on 10/08/2020 10/15/19   Earnstine Regal, PA-C  famotidine (PEPCID) 40 MG tablet 40 mg in the evening Patient not taking: Reported on 10/08/2020 04/05/20   Jiles Prows, MD  fluticasone [facility]) 50 MCG/ACT nasal spray 1 spray each nostril 1 time per day Patient not taking: Reported on 10/08/2020 04/05/20   Jiles Prows, MD  hydrOXYzine (ATARAX/VISTARIL) 25 MG tablet Take 1 tablet (25 mg total) by mouth every 8 (eight) hours as needed for up to 5 days. 10/13/20 10/18/20  Lannie Fields, PA-C  loratadine (CLARITIN) 10 MG tablet 1-2 tablets 1 time per day Patient not taking: Reported on 10/08/2020 04/05/20   Jiles Prows, MD  montelukast (SINGULAIR) 10 MG tablet 1 tablet 1 time per day Patient not taking: Reported on 10/08/2020 04/05/20   Jiles Prows, MD  Multiple Vitamins-Iron (MULTIVITAMINS WITH IRON) TABS tablet You should get a Women's One a day vitamin with iron and take daily.  This will help with your low hemoglobin.  You can buy this at any drug store, and choose whatever brand you like. Patient taking differently: Take 1 tablet by mouth daily.  10/15/19   Earnstine Regal, PA-C  ondansetron (ZOFRAN ODT) 4 MG disintegrating tablet Take 1 tablet (4 mg total) by mouth every 8 (eight) hours as needed for nausea or vomiting. 10/08/20   Suzy Bouchard, PA-C    Allergies Patient has no known allergies.  Family History  Problem Relation Age of Onset  . Healthy Mother   . Healthy Father   . Thyroid disease Neg Hx     Social History Social History   Tobacco Use  .  Smoking status: Former Research scientist (life sciences)  . Smokeless tobacco: Never Used  Vaping Use  . Vaping Use: Never used  Substance Use Topics  . Alcohol use: No  . Drug use: No     Review of Systems  Constitutional: No fever/chills Eyes:  No discharge ENT: No upper respiratory complaints. Respiratory: no cough. No SOB/ use of accessory muscles to breath Gastrointestinal:   No nausea, no vomiting.  No diarrhea.  No constipation. Musculoskeletal: Negative for musculoskeletal pain. Skin: Negative for rash, abrasions, lacerations, ecchymosis.   ____________________________________________   PHYSICAL EXAM:  VITAL SIGNS: ED Triage Vitals  Enc Vitals Group     BP 10/13/20 1808 (!) 136/94     Pulse Rate 10/13/20 1808 68     Resp 10/13/20 1808 (!) 24     Temp 10/13/20 1808 97.7 F (36.5 C)     Temp Source 10/13/20 1808 Oral  SpO2 10/13/20 1808 100 %     Weight --      Height --      Head Circumference --      Peak Flow --      Pain Score 10/13/20 1809 0     Pain Loc --      Pain Edu? --      Excl. in [place]? --      Constitutional: Alert and oriented. Well appearing and in no acute distress. Eyes: Conjunctivae are normal. PERRL. EOMI. Head: Atraumatic. ENT:      Nose: No congestion/rhinnorhea.      Mouth/Throat: Mucous membranes are moist.  Neck: No stridor.  No cervical spine tenderness to palpation.  Cardiovascular: Normal rate, regular rhythm. Normal S1 and S2.  Good peripheral circulation. Respiratory: Normal respiratory effort without tachypnea or retractions. Lungs CTAB. Good air entry to the bases with no decreased or absent breath sounds Gastrointestinal: Bowel sounds x 4 quadrants. Soft and nontender to palpation. No guarding or rigidity. No distention. Musculoskeletal: Full range of motion to all extremities. No obvious deformities noted Neurologic:  Normal for age. No gross focal neurologic deficits are appreciated.  Skin:  Skin is warm, dry and intact. No rash  noted. Psychiatric: Mood and affect are normal for age. Speech and behavior are normal.   ____________________________________________   LABS (all labs ordered are listed, but only abnormal results are displayed)  Labs Reviewed - No data to display ____________________________________________  EKG   ____________________________________________  RADIOLOGY  No results found.  ____________________________________________    PROCEDURES  Procedure(s) performed:     Procedures     Medications - No data to display   ____________________________________________   INITIAL IMPRESSION / ASSESSMENT AND PLAN / ED COURSE  Pertinent labs & imaging results that were available during my care of the patient were reviewed by me and considered in my medical decision making (see chart for details).      Assessment and Plan:  Anxiety 79 year old male presents to the urgent care with unmanaged anxiety at home.  Review of the drug database was undertaken during this urgent care encounter and patient received a 30-day supply of Xanax on 09/27/2020.  Patient states that he decided to stop his Lexapro at home on his own.  Patient states that since stopping Lexapro abruptly, he has had increased anxiety.  Patient states that when he is feeling anxious, his chest feels tight.  He denies current chest pain or shortness of breath.  Patient states that he has an appointment on Monday to discuss his psychiatric medications with his primary care provider.  He states that he is requesting medication to help with anxiety until he can be seen on Monday.  Patient adamantly denies suicidal or homicidal ideation.  I recommended seeking care in the emergency department and patient refused stating that he was "treated like a dog there".  I reviewed labs from emergency department encounter on 10/08/2020 which were reassuring.  On exam, patient had symmetric strength in the upper and lower extremities.   He was able to provide his own historical information.  I gave patient a short course of hydroxyzine to be used as needed for anxiety.  I cautioned patient that should he experience worsening chest tightness, shortness of breath or chest pain that he needed to seek care at local emergency department. All patient questions were answered.      ____________________________________________  FINAL CLINICAL IMPRESSION(S) / ED DIAGNOSES  Final diagnoses:  Anxiety state  NEW MEDICATIONS STARTED DURING THIS VISIT:  ED Discharge Orders         Ordered    hydrOXYzine (ATARAX/VISTARIL) 25 MG tablet  Every 8 hours PRN        10/13/20 1911              This chart was dictated using voice recognition software/Dragon. Despite best efforts to proofread, errors can occur which can change the meaning. Any change was purely unintentional.     Lannie Fields, PA-C 10/13/20 1933

## 2020-10-13 NOTE — ED Notes (Signed)
Spoke to [employer], np.  Patient can be seen here at [facility]

## 2020-10-18 ENCOUNTER — Ambulatory Visit
Admission: RE | Admit: 2020-10-18 | Discharge: 2020-10-18 | Disposition: A | Discharge status: Home / Self Care | Payer: 59 | Source: Ambulatory Visit | Attending: Otolaryngology | Admitting: Otolaryngology

## 2020-10-18 ENCOUNTER — Other Ambulatory Visit: Payer: Self-pay | Admitting: Otolaryngology

## 2020-10-18 DIAGNOSIS — R1312 Dysphagia, oropharyngeal phase: Secondary | ICD-10-CM

## 2020-10-20 ENCOUNTER — Ambulatory Visit: Payer: 59 | Admitting: Neurology

## 2020-11-23 ENCOUNTER — Other Ambulatory Visit: Payer: Self-pay | Admitting: Internal Medicine

## 2020-11-24 LAB — LIPID PANEL
Cholesterol: 189 mg/dL (ref <200)
HDL: 47 mg/dL (ref >=40)
LDL Cholesterol (Calc): 119 mg/dL (calc) — ABNORMAL HIGH
Non-HDL Cholesterol (Calc): 142 mg/dL (calc) — ABNORMAL HIGH (ref <130)
Total CHOL/HDL Ratio: 4 (calc) (ref <5.0)
Triglycerides: 121 mg/dL (ref <150)

## 2020-11-24 LAB — CBC
HCT: 39.9 % (ref 38.5–50.0)
Hemoglobin: 13.5 g/dL (ref 13.2–17.1)
MCH: 32.2 pg (ref 27.0–33.0)
MCHC: 33.8 g/dL (ref 32.0–36.0)
MCV: 95.2 fL (ref 80.0–100.0)
MPV: 10.8 fL (ref 7.5–12.5)
Platelets: 153 10*3/uL (ref 140–400)
RBC: 4.19 10*6/uL — ABNORMAL LOW (ref 4.20–5.80)
RDW: 13.2 % (ref 11.0–15.0)
WBC: 3.3 10*3/uL — ABNORMAL LOW (ref 3.8–10.8)

## 2020-11-24 LAB — COMPLETE METABOLIC PANEL WITH GFR
AG Ratio: 2.1 (calc) (ref 1.0–2.5)
ALT: 12 U/L (ref 9–46)
AST: 18 U/L (ref 10–35)
Albumin: 4.6 g/dL (ref 3.6–5.1)
Alkaline phosphatase (APISO): 110 U/L (ref 35–144)
BUN/Creatinine Ratio: 9 (calc) (ref 6–22)
BUN: 13 mg/dL (ref 7–25)
CO2: 30 mmol/L (ref 20–32)
Calcium: 9.5 mg/dL (ref 8.6–10.3)
Chloride: 105 mmol/L (ref 98–110)
Creat: 1.43 mg/dL — ABNORMAL HIGH (ref 0.70–1.11)
GFR, Est African American: 53 mL/min/{1.73_m2} — ABNORMAL LOW (ref >=60)
GFR, Est Non African American: 46 mL/min/{1.73_m2} — ABNORMAL LOW (ref >=60)
Globulin: 2.2 g/dL (calc) (ref 1.9–3.7)
Glucose, Bld: 75 mg/dL (ref 65–99)
Potassium: 4.4 mmol/L (ref 3.5–5.3)
Sodium: 142 mmol/L (ref 135–146)
Total Bilirubin: 0.6 mg/dL (ref 0.2–1.2)
Total Protein: 6.8 g/dL (ref 6.1–8.1)

## 2020-11-24 LAB — TSH: TSH: 0.77 mIU/L (ref 0.40–4.50)

## 2021-01-16 DIAGNOSIS — M542 Cervicalgia: Secondary | ICD-10-CM | POA: Insufficient documentation

## 2021-01-16 DIAGNOSIS — I10 Essential (primary) hypertension: Secondary | ICD-10-CM | POA: Insufficient documentation

## 2021-01-24 DIAGNOSIS — Z981 Arthrodesis status: Secondary | ICD-10-CM | POA: Insufficient documentation

## 2021-01-24 DIAGNOSIS — M5412 Radiculopathy, cervical region: Secondary | ICD-10-CM | POA: Insufficient documentation

## 2021-02-15 DIAGNOSIS — K5909 Other constipation: Secondary | ICD-10-CM | POA: Insufficient documentation

## 2021-02-28 ENCOUNTER — Ambulatory Visit: Payer: Medicare HMO | Admitting: Podiatry

## 2021-02-28 ENCOUNTER — Other Ambulatory Visit: Payer: Self-pay

## 2021-02-28 DIAGNOSIS — M79674 Pain in right toe(s): Secondary | ICD-10-CM

## 2021-02-28 DIAGNOSIS — L600 Ingrowing nail: Secondary | ICD-10-CM | POA: Diagnosis not present

## 2021-02-28 DIAGNOSIS — M79675 Pain in left toe(s): Secondary | ICD-10-CM | POA: Diagnosis not present

## 2021-02-28 DIAGNOSIS — B351 Tinea unguium: Secondary | ICD-10-CM | POA: Diagnosis not present

## 2021-02-28 NOTE — Patient Instructions (Addendum)

## 2021-03-05 NOTE — Progress Notes (Signed)
Subjective:   Patient ID: Randy Merritt, male   DOB: 80 y.o.   MRN: [id number]   HPI 80 year old male presents the office today for concerns of recurrent ingrown toe of the right big toe, medial aspect which does cause discomfort occasionally.  Denies any drainage or pus any swelling or redness.  Also also the other nails be trimmed as they are thickened elongated he cannot do them himself.  Last A1c was 6.1.  He has no other concerns today.   Review of Systems  All other systems reviewed and are negative.  Past Medical History:  Diagnosis Date  . Acid reflux   . Anxiety   . Arthritis   . Depression    " a little"  . Goiter   . High cholesterol   . History of blood transfusion   . Perforated bowel ([place])   . Seasonal allergies     Past Surgical History:  Procedure Laterality Date  . ANTERIOR CERVICAL DECOMP/DISCECTOMY FUSION N/A 04/08/2013   Procedure: ANTERIOR CERVICAL DECOMPRESSION/DISCECTOMY FUSION 1 LEVEL;  Surgeon: Floyce Stakes, MD;  Location: [facility];  Service: Neurosurgery;  Laterality: N/A;  Cervical five-six Anterior cervical decompression/diskectomy fusion  . APPENDECTOMY N/A 10/06/2019   Procedure: APPENDECTOMY;  Surgeon: Clovis Riley, MD;  Location: [place];  Service: General;  Laterality: N/A;  . BIOPSY  01/30/2019   Procedure: BIOPSY;  Surgeon: Carol Ada, MD;  [facility];  Service: Endoscopy;;  . CERVICAL DISCECTOMY     x2  . CHOLECYSTECTOMY    . COLON RESECTION SIGMOID N/A 10/06/2019   Procedure: COLON RESECTION SIGMOID;  Surgeon: Clovis Riley, MD;  Location: [place];  Service: General;  Laterality: N/A;  . ESOPHAGEAL DILATION  01/30/2019   Procedure: ESOPHAGEAL DILATION;  Surgeon: Carol Ada, MD;  [facility];  Service: Endoscopy;;  [place]  . ESOPHAGOGASTRODUODENOSCOPY (EGD) WITH PROPOFOL N/A 01/30/2019   Procedure: ESOPHAGOGASTRODUODENOSCOPY (EGD) WITH PROPOFOL;  Surgeon: Carol Ada, MD;  [facility];   Service: Endoscopy;  Laterality: N/A;  . gallstone removal    . LAPAROTOMY N/A 10/06/2019   Procedure: EXPLORATORY LAPAROTOMY;  Surgeon: Clovis Riley, MD;  [facility];  Service: General;  Laterality: N/A;     Current Outpatient Medications:  .  acetaminophen (TYLENOL) 500 MG tablet, Take 2 tablets (1,000 mg total) by mouth every 8 (eight) hours. (Patient taking differently: Take 1,000 mg by mouth every 8 (eight) hours as needed. pain), Disp: 30 tablet, Rfl: 0 .  ALPRAZolam (XANAX) 0.5 MG tablet, Take 1 tablet (0.5 mg total) by mouth 2 (two) times daily as needed for anxiety. (Patient not taking: Reported on 10/08/2020), Disp: 30 tablet, Rfl: 0 .  alum & mag hydroxide-simeth (MAALOX/MYLANTA) 419-622-29 MG/5ML suspension, Take 15 mLs by mouth every 6 (six) hours as needed for indigestion or heartburn. (Patient not taking: Reported on 10/08/2020), Disp: 355 mL, Rfl: 0 .  amLODipine (NORVASC) 5 MG tablet, Take 5 mg by mouth daily., Disp: , Rfl:  .  aspirin EC 81 MG tablet, Take 1 tablet (81 mg total) by mouth daily. (Patient not taking: Reported on 10/08/2020), Disp:  , Rfl:  .  chlordiazePOXIDE (LIBRIUM) 25 MG capsule, , Disp: , Rfl:  .  famotidine (PEPCID) 40 MG tablet, 40 mg in the evening (Patient not taking: Reported on 10/08/2020), Disp: 30 tablet, Rfl: 5 .  fluticasone (FLONASE) 50 MCG/ACT nasal spray, 1 spray each nostril 1 time per day (Patient not taking: Reported on 10/08/2020), Disp:  16 g, Rfl: 5 .  lamoTRIgine (LAMICTAL) 25 MG tablet, Take 25 mg by mouth daily., Disp: , Rfl:  .  loratadine (CLARITIN) 10 MG tablet, 1-2 tablets 1 time per day (Patient not taking: Reported on 10/08/2020), Disp: 60 tablet, Rfl: 5 .  montelukast (SINGULAIR) 10 MG tablet, 1 tablet 1 time per day (Patient not taking: Reported on 10/08/2020), Disp: 30 tablet, Rfl: 5 .  Multiple Vitamins-Iron (MULTIVITAMINS WITH IRON) TABS tablet, You should get a Women's One a day vitamin with iron and take daily.  This  will help with your low hemoglobin.  You can buy this at any drug store, and choose whatever brand you like. (Patient taking differently: Take 1 tablet by mouth daily. ), Disp: , Rfl: 0 .  ondansetron (ZOFRAN ODT) 4 MG disintegrating tablet, Take 1 tablet (4 mg total) by mouth every 8 (eight) hours as needed for nausea or vomiting., Disp: 20 tablet, Rfl: 0 .  pantoprazole (PROTONIX) 40 MG tablet, Take 1 tablet (40 mg total) by mouth 2 (two) times daily., Disp: 60 tablet, Rfl: 5 .  polyethylene glycol powder (GLYCOLAX/MIRALAX) 17 GM/SCOOP powder, SMARTSIG:255 Gram(s) By Mouth Once, Disp: , Rfl:  .  rosuvastatin (CRESTOR) 10 MG tablet, Take 10 mg by mouth daily., Disp: , Rfl:  .  timolol (TIMOPTIC) 0.5 % ophthalmic solution, 1 drop 2 (two) times daily., Disp: , Rfl:  .  tiZANidine (ZANAFLEX) 2 MG tablet, Take by mouth., Disp: , Rfl:   Allergies  Allergen Reactions  . Alprazolam Other (See Comments)  . Cyclobenzaprine Other (See Comments)  . Prednisone Palpitations and Other (See Comments)         Objective:  Physical Exam  General: AAO x3, NAD  Dermatological: Incurvation present to medial aspect the right hallux toenail with localized edema but there is no drainage or pus or erythema.  No ascending cellulitis.  No open lesions.  The remainder the nails are hypertrophic, dystrophic with yellow-brown discoloration.  To cause discomfort inside shoes with pressure.    Vascular: Dorsalis Pedis artery and Posterior Tibial artery pedal pulses are 2/4 bilateral with immedate capillary fill time. There is no pain with calf compression, swelling, warmth, erythema.   Neruologic: Grossly intact via light touch bilateral.   Musculoskeletal: No gross boney pedal deformities bilateral. No pain, crepitus, or limitation noted with foot and ankle range of motion bilateral. Muscular strength 5/5 in all groups tested bilateral.  Gait: Unassisted, Nonantalgic.       Assessment:   Right medial hallux  ingrown toenail, symptomatic onychomycosis    Plan:  -Treatment options discussed including all alternatives, risks, and complications -Etiology of symptoms were discussed  1.  Ingrown toenail right medial hallux -At this time, the patient is requesting partial nail removal with chemical matricectomy to the symptomatic portion of the nail. Risks and complications were discussed with the patient for which they understand and written consent was obtained. Under sterile conditions a total of 3 mL of a mixture of 2% lidocaine plain and 0.5% Marcaine plain was infiltrated in a hallux block fashion. Once anesthetized, the skin was prepped in sterile fashion. A tourniquet was then applied. Next the medial aspect of hallux nail border was then sharply excised making sure to remove the entire offending nail border. Once the nails were ensured to be removed area was debrided and the underlying skin was intact. There is no purulence identified in the procedure. Next phenol was then applied under standard conditions and copiously irrigated. Silvadene was  applied. A dry sterile dressing was applied. After application of the dressing the tourniquet was removed and there is found to be an immediate capillary refill time to the digit. The patient tolerated the procedure well any complications. Post procedure instructions were discussed the patient for which he verbally understood. Follow-up in one week for nail check or sooner if any problems are to arise. Discussed signs/symptoms of infection and directed to call the office immediately should any occur or go directly to the emergency room. In the meantime, encouraged to call the office with any questions, concerns, changes symptoms.  2.  Symptomatic onychomycosis -Nails sharply debrided x9 without any complications or bleeding  Trula Slade DPM

## 2021-03-21 ENCOUNTER — Ambulatory Visit: Payer: Medicare HMO | Admitting: Podiatry

## 2021-03-21 ENCOUNTER — Other Ambulatory Visit: Payer: Self-pay

## 2021-03-21 DIAGNOSIS — L600 Ingrowing nail: Secondary | ICD-10-CM

## 2021-03-22 NOTE — Progress Notes (Signed)
Subjective: Randy Merritt is a 80 y.o.  male returns to office today for follow up evaluation after having right Hallux partial nail avulsion performed. Patient was initially soaking using epsom salts and applying topical antibiotic covered with bandaid daily.  He states that the toe is doing great not having any pain.  Denies any drainage or pus or any swelling.  Patient denies fevers, chills, nausea, vomiting. Denies any calf pain, chest pain, SOB.   Objective:  General: Well developed, nourished, in no acute distress, alert and oriented x3   Dermatology: Skin is warm, dry and supple bilateral.  Right hallux nail border appears to be clean, dry, with surrounding scab. There is no surrounding erythema, edema, drainage/purulence. There are no other lesions or other signs of infection present.  Neurovascular status: Intact. No lower extremity swelling; No pain with calf compression bilateral.  Musculoskeletal: No tenderness to palpation of the right hallux nail fold. Muscular strength within normal limits bilateral.   Assesement and Plan: S/p partial nail avulsion, doing well.   -Continue soaking in epsom salts twice a day followed by antibiotic ointment and a band-aid. Can leave uncovered at night. Continue this until completely healed.  -If the area has not healed in 2 weeks, call the office for follow-up appointment, or sooner if any problems arise.  -Monitor for any signs/symptoms of infection. Call the office immediately if any occur or go directly to the emergency room. Call with any questions/concerns.  Celesta Gentile, DPM

## 2021-04-11 ENCOUNTER — Other Ambulatory Visit: Payer: Self-pay | Admitting: Family Medicine

## 2021-04-11 DIAGNOSIS — R1084 Generalized abdominal pain: Secondary | ICD-10-CM

## 2021-05-03 ENCOUNTER — Ambulatory Visit
Admission: RE | Admit: 2021-05-03 | Discharge: 2021-05-03 | Disposition: A | Discharge status: Home / Self Care | Payer: 59 | Source: Ambulatory Visit | Attending: Family Medicine | Admitting: Family Medicine

## 2021-05-03 DIAGNOSIS — R1084 Generalized abdominal pain: Secondary | ICD-10-CM

## 2021-05-17 ENCOUNTER — Emergency Department (HOSPITAL_COMMUNITY): Payer: Medicare HMO

## 2021-05-17 ENCOUNTER — Ambulatory Visit (HOSPITAL_COMMUNITY): Payer: Medicare HMO

## 2021-05-17 ENCOUNTER — Encounter (HOSPITAL_COMMUNITY): Payer: Self-pay

## 2021-05-17 ENCOUNTER — Other Ambulatory Visit: Payer: Self-pay

## 2021-05-17 ENCOUNTER — Ambulatory Visit (HOSPITAL_COMMUNITY)
Admission: EM | Admit: 2021-05-17 | Discharge: 2021-05-17 | Disposition: A | Discharge status: Home / Self Care | Payer: Medicare HMO | Source: Home / Self Care

## 2021-05-17 ENCOUNTER — Emergency Department (HOSPITAL_COMMUNITY)
Admission: EM | Admit: 2021-05-17 | Discharge: 2021-05-17 | Disposition: A | Discharge status: Home / Self Care | Payer: Medicare HMO | Attending: Emergency Medicine | Admitting: Emergency Medicine

## 2021-05-17 DIAGNOSIS — R110 Nausea: Secondary | ICD-10-CM | POA: Diagnosis not present

## 2021-05-17 DIAGNOSIS — Z87891 Personal history of nicotine dependence: Secondary | ICD-10-CM | POA: Insufficient documentation

## 2021-05-17 DIAGNOSIS — R140 Abdominal distension (gaseous): Secondary | ICD-10-CM

## 2021-05-17 DIAGNOSIS — R109 Unspecified abdominal pain: Secondary | ICD-10-CM | POA: Insufficient documentation

## 2021-05-17 DIAGNOSIS — R1084 Generalized abdominal pain: Secondary | ICD-10-CM

## 2021-05-17 LAB — COMPREHENSIVE METABOLIC PANEL
ALT: 27 U/L (ref 0–44)
AST: 26 U/L (ref 15–41)
Albumin: 4.3 g/dL (ref 3.5–5.0)
Alkaline Phosphatase: 110 U/L (ref 38–126)
Anion gap: 8 (ref 5–15)
BUN: 14 mg/dL (ref 8–23)
CO2: 28 mmol/L (ref 22–32)
Calcium: 9.8 mg/dL (ref 8.9–10.3)
Chloride: 102 mmol/L (ref 98–111)
Creatinine, Ser: 1.31 mg/dL — ABNORMAL HIGH (ref 0.61–1.24)
GFR, Estimated: 55 mL/min — ABNORMAL LOW (ref >60)
Glucose, Bld: 97 mg/dL (ref 70–99)
Potassium: 3.9 mmol/L (ref 3.5–5.1)
Sodium: 138 mmol/L (ref 135–145)
Total Bilirubin: 1 mg/dL (ref 0.3–1.2)
Total Protein: 7.1 g/dL (ref 6.5–8.1)

## 2021-05-17 LAB — CBC WITH DIFFERENTIAL/PLATELET
Abs Immature Granulocytes: 0.01 10*3/uL (ref 0.00–0.07)
Basophils Absolute: 0 10*3/uL (ref 0.0–0.1)
Basophils Relative: 0 %
Eosinophils Absolute: 0.1 10*3/uL (ref 0.0–0.5)
Eosinophils Relative: 2 %
HCT: 43.3 % (ref 39.0–52.0)
Hemoglobin: 14.4 g/dL (ref 13.0–17.0)
Immature Granulocytes: 0 %
Lymphocytes Relative: 44 %
Lymphs Abs: 1.3 10*3/uL (ref 0.7–4.0)
MCH: 31.4 pg (ref 26.0–34.0)
MCHC: 33.3 g/dL (ref 30.0–36.0)
MCV: 94.3 fL (ref 80.0–100.0)
Monocytes Absolute: 0.3 10*3/uL (ref 0.1–1.0)
Monocytes Relative: 10 %
Neutro Abs: 1.3 10*3/uL — ABNORMAL LOW (ref 1.7–7.7)
Neutrophils Relative %: 44 %
Platelets: 174 10*3/uL (ref 150–400)
RBC: 4.59 MIL/uL (ref 4.22–5.81)
RDW: 12.9 % (ref 11.5–15.5)
WBC: 3.1 10*3/uL — ABNORMAL LOW (ref 4.0–10.5)
nRBC: 0 % (ref 0.0–0.2)

## 2021-05-17 LAB — POCT URINALYSIS DIPSTICK, ED / UC
Bilirubin Urine: NEGATIVE
Glucose, UA: NEGATIVE mg/dL
Ketones, ur: NEGATIVE mg/dL
Leukocytes,Ua: NEGATIVE
Nitrite: NEGATIVE
Protein, ur: NEGATIVE mg/dL
Specific Gravity, Urine: 1.015 (ref 1.005–1.030)
Urobilinogen, UA: 0.2 mg/dL (ref 0.0–1.0)
pH: 5 (ref 5.0–8.0)

## 2021-05-17 LAB — URINALYSIS, ROUTINE W REFLEX MICROSCOPIC
Bacteria, UA: NONE SEEN
Bilirubin Urine: NEGATIVE
Glucose, UA: NEGATIVE mg/dL
Hgb urine dipstick: NEGATIVE
Ketones, ur: 5 mg/dL — AB
Leukocytes,Ua: NEGATIVE
Nitrite: NEGATIVE
Protein, ur: 30 mg/dL — AB
Specific Gravity, Urine: >1.046 — ABNORMAL HIGH (ref 1.005–1.030)
pH: 5 (ref 5.0–8.0)

## 2021-05-17 LAB — LIPASE, BLOOD: Lipase: 23 U/L (ref 11–51)

## 2021-05-17 MED ORDER — ONDANSETRON 4 MG PO TBDP: 4.0000 mg | ORAL_TABLET | Freq: Three times a day (TID) | ORAL | 0 refills | Status: DC | PRN

## 2021-05-17 MED ORDER — IOHEXOL 300 MG/ML  SOLN
75.0000 mL | Freq: Once | INTRAMUSCULAR | Status: AC | PRN
  Administered 2021-05-17: 12:00:00 75 mL via INTRAVENOUS

## 2021-05-17 MED ORDER — PROMETHAZINE HCL 12.5 MG PO TABS: 12.5000 mg | ORAL_TABLET | Freq: Four times a day (QID) | ORAL | 0 refills | Status: DC | PRN

## 2021-05-17 NOTE — ED Triage Notes (Signed)
Pt c/o having abd irritability, with bloating. Pt states he was not able to take in food this morning, states he feels nauseated at times. States ever since his colonoscopy he hasn't felt the same (in abd). LBM: this morning (small). States he isnt passing flatus rectally but is belching. (Hx of perforated bowel) abd tight and distended.  Started: a couple months ago (per pt)  Interventions: Miralax with not much relief.

## 2021-05-17 NOTE — ED Provider Notes (Signed)
[facility]    CSN: [id number] Arrival date & time: 05/17/21  5809      History   Chief Complaint Chief Complaint  Patient presents with   Abdominal Pain    HPI Randy Merritt is a 80 y.o. male.   HPI  Abdominal Pain: Pt is a poor historian. Patient reports that he has been having abdominal bloating and generalized discomfort for the past few months-worsening as of this morning.  He states that ever since his colonoscopy in 2020 he has not felt normal.  As of this morning he has no appetite and he feels nauseated with increased pain and bloating sensation of his abdomen. Discomfort rated 8/10 in nature.  He had a very small bowel movement this morning that was normal in color.  He states that he is belching but has noticed decreased flatulence today.  Patient does have a history of perforated bowel in 2020 which occurred during his colonoscopy.   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/8250   Oropharyngeal dysphagia 02/02/2017   Chronic rhinitis 02/01/2017   Gastroesophageal reflux disease 02/01/2017    Past Surgical History:  Procedure Laterality Date   ANTERIOR CERVICAL DECOMP/DISCECTOMY FUSION N/A 04/08/2013   Procedure: ANTERIOR CERVICAL DECOMPRESSION/DISCECTOMY FUSION 1 LEVEL;  Surgeon: Floyce Stakes, MD;  Location: [facility];  Service: Neurosurgery;  Laterality: N/A;  Cervical five-six Anterior cervical decompression/diskectomy fusion   APPENDECTOMY N/A 10/06/2019   Procedure: APPENDECTOMY;  Surgeon:  Clovis Riley, MD;  Location: [facility];  Service: General;  Laterality: N/A;   BIOPSY  01/30/2019   Procedure: BIOPSY;  Surgeon: Carol Ada, MD;  Location: [facility];  Service: Endoscopy;;   CERVICAL DISCECTOMY     x2   CHOLECYSTECTOMY     COLON RESECTION SIGMOID N/A 10/06/2019   Procedure: COLON RESECTION SIGMOID;  Surgeon: Clovis Riley, MD;  Location: [place];  Service: General;  Laterality: N/A;   ESOPHAGEAL DILATION  01/30/2019   Procedure: ESOPHAGEAL DILATION;  Surgeon: Carol Ada, MD;  Location: [facility];  Service: Endoscopy;;  [place]   ESOPHAGOGASTRODUODENOSCOPY (EGD) WITH PROPOFOL N/A 01/30/2019   Procedure: ESOPHAGOGASTRODUODENOSCOPY (EGD) WITH PROPOFOL;  Surgeon: Carol Ada, MD;  Location: [facility];  Service: Endoscopy;  Laterality: N/A;   gallstone removal     LAPAROTOMY N/A 10/06/2019   Procedure: EXPLORATORY LAPAROTOMY;  Surgeon: Clovis Riley, MD;  Location: [facility];  Service: General;  Laterality: N/A;       Home Medications    Prior to Admission medications   Medication Sig Start Date End Date Taking? Authorizing Provider  acetaminophen (TYLENOL) 500 MG tablet Take 2 tablets (1,000 mg total) by mouth every 8 (eight) hours. Patient taking differently: Take 1,000 mg by mouth every 8 (eight) hours as needed. pain 10/15/19   Earnstine Regal, PA-C  ALPRAZolam Duanne [facility]on) 0.5 MG tablet Take 1 tablet (0.5 mg total) by mouth 2 (two) times daily as needed for anxiety.  Patient not taking: Reported on 10/08/2020 07/10/20   Raylene Everts, MD  alum & mag hydroxide-simeth (MAALOX/MYLANTA) 200-200-20 MG/5ML suspension Take 15 mLs by mouth every 6 (six) hours as needed for indigestion or heartburn. Patient not taking: Reported on 10/08/2020 06/19/19   Wieters, Hallie C, PA-C  amLODipine (NORVASC) 5 MG tablet Take 5 mg by mouth daily.    [provider]  aspirin EC 81 MG tablet Take 1 tablet (81 mg total) by mouth daily. Patient not taking: Reported on  10/08/2020 10/15/19   Earnstine Regal, PA-C  chlordiazePOXIDE (LIBRIUM) 25 MG capsule  10/17/20   [provider]  famotidine (PEPCID) 40 MG tablet 40 mg in the evening Patient not taking: Reported on 10/08/2020 04/05/20   Jiles Prows, MD  fluticasone [facility]) 50 MCG/ACT nasal spray 1 spray each nostril 1 time per day Patient not taking: Reported on 10/08/2020 04/05/20   Jiles Prows, MD  lamoTRIgine (LAMICTAL) 25 MG tablet Take 25 mg by mouth daily. 10/17/20   [provider]  loratadine (CLARITIN) 10 MG tablet 1-2 tablets 1 time per day Patient not taking: Reported on 10/08/2020 04/05/20   Jiles Prows, MD  montelukast (SINGULAIR) 10 MG tablet 1 tablet 1 time per day Patient not taking: Reported on 10/08/2020 04/05/20   Jiles Prows, MD  Multiple Vitamins-Iron (MULTIVITAMINS WITH IRON) TABS tablet You should get a Women's One a day vitamin with iron and take daily.  This will help with your low hemoglobin.  You can buy this at any drug store, and choose whatever brand you like. Patient taking differently: Take 1 tablet by mouth daily.  10/15/19   Earnstine Regal, PA-C  ondansetron (ZOFRAN ODT) 4 MG disintegrating tablet Take 1 tablet (4 mg total) by mouth every 8 (eight) hours as needed for nausea or vomiting. 10/08/20   Suzy Bouchard, PA-C  pantoprazole (PROTONIX) 40 MG tablet Take 1 tablet (40 mg total) by mouth 2 (two) times daily. 04/05/20   Kozlow, Donnamarie Poag, MD  polyethylene glycol powder [facility]) 17 GM/SCOOP powder SMARTSIG:255 Gram(s) By Mouth Once 10/08/20   [provider]  rosuvastatin (CRESTOR) 10 MG tablet Take 10 mg by mouth daily. 03/01/20   [provider]  timolol (TIMOPTIC) 0.5 % ophthalmic solution 1 drop 2 (two) times daily. 11/23/20   [provider]  tiZANidine (ZANAFLEX) 2 MG tablet Take by mouth.    [provider]    Family History Family History  Problem Relation Age of Onset   Healthy Mother     Healthy Father    Thyroid disease Neg Hx     Social History Social History   Tobacco Use   Smoking status: Former    Pack years: 0.00   Smokeless tobacco: Never  Vaping Use   Vaping Use: Never used  Substance Use Topics   Alcohol use: No   Drug use: No     Allergies   Alprazolam, Cyclobenzaprine, and Prednisone   Review of Systems Review of Systems  As stated above in HPI Physical Exam Triage Vital Signs ED Triage Vitals  Enc Vitals Group     BP 05/17/21 0840 (!) 144/89     Pulse Rate 05/17/21 0840 80     Resp 05/17/21 0840 20     Temp 05/17/21 0840 98.1 F (36.7 C)     Temp Source 05/17/21 0840 Oral     SpO2 05/17/21 0840 99 %     Weight --  Height --      Head Circumference --      Peak Flow --      Pain Score 05/17/21 0842 10     Pain Loc --      Pain Edu? --      Excl. in [place]? --    No data found.  Updated Vital Signs BP (!) 144/89 (BP Location: Left Arm)   Pulse 80   Temp 98.1 F (36.7 C) (Oral)   Resp 20   SpO2 99%   Physical Exam Vitals and nursing note reviewed.  Constitutional:      General: He is not in acute distress.    Appearance: He is well-developed. He is not ill-appearing, toxic-appearing or diaphoretic.  HENT:     Head: Normocephalic and atraumatic.  Cardiovascular:     Rate and Rhythm: Normal rate and regular rhythm.     Heart sounds: Normal heart sounds.  Pulmonary:     Effort: Pulmonary effort is normal.     Breath sounds: Normal breath sounds.  Abdominal:     General: Abdomen is protuberant. Bowel sounds are normal. There is distension. There is no abdominal bruit.     Palpations: Abdomen is rigid. There is no hepatomegaly or splenomegaly.     Tenderness: There is generalized abdominal tenderness and tenderness in the left lower quadrant. There is guarding and rebound. There is no right CVA tenderness or left CVA tenderness. Negative signs include Murphy's sign and McBurney's sign.     Hernia: No hernia is present.   Skin:    General: Skin is warm.     Coloration: Skin is not jaundiced.  Neurological:     Mental Status: He is alert.     UC Treatments / Results  Labs (all labs ordered are listed, but only abnormal results are displayed) Labs Reviewed  URINE CULTURE  POCT URINALYSIS DIPSTICK, ED / UC    EKG   Radiology No results found.  Procedures Procedures (including critical care time)  Medications Ordered in UC Medications - No data to display  Initial Impression / Assessment and Plan / UC Course  I have reviewed the triage vital signs and the nursing notes.  Pertinent labs & imaging results that were available during my care of the patient were reviewed by me and considered in my medical decision making (see chart for details).     New.  There is concern for bowel obstruction versus perforation in an 80 year old male.  His urinalysis is nonspecific.  I discussed my concerns with patient given the extent of his symptoms and physical exam findings.  Previously were going to order a KUB however with his abdominal examination showing so many abnormalities I have changed my mind as he will need to be evaluated further in the emergency room regardless.  I discussed this with patient who is agreeable.  He will be n.p.o.  He elects to travel via private vehicle. Final Clinical Impressions(s) / UC Diagnoses   Final diagnoses:  None   Discharge Instructions   None    ED Prescriptions   None    PDMP not reviewed this encounter.   Hughie Closs, [name] 05/17/21 [phone]

## 2021-05-17 NOTE — ED Provider Notes (Signed)
[facility] EMERGENCY DEPARTMENT Provider Note   CSN: [id number] Arrival date & time: 05/17/21  6222    History Abd pain   Randy Merritt is a 80 y.o. male with past medical history significant for bowel perforation s/p resection after colonoscopy who presented for evaluation abdominal pain.  Initially was assessed by urgent care.  Felt he needed imaging to rule out obstruction sent here to the emergency department.  Patient states he has had intermittent abdominal pain since he had a colonoscopy in 2020.  Patient states intermittently he will develop constipation with "small balls" of stool.  He denies any melena or bright red per rectum.  His last bowel movement was this morning which he states was "small."  He is passing flatus.  States he also gets intermittently nauseous when this occurs.  He has no abdominal pain associated with eating.  On my initial evaluation patient has no pain.  He is requesting p.o. intake as he states he only had a "small yogurt" this morning."  He has an appoint with a GI doctor in August.  Feels generally bloated, which she has felt over the last year.  Denies fever, chills, emesis, chest pain, shortness of breath, diarrhea.  Rates his current pain a 0/10.  Denies additional aggravating relieving factors.  History obtained from patient and past medical records.  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/98/9211   Oropharyngeal dysphagia 02/02/2017   Chronic rhinitis  02/01/2017   Gastroesophageal reflux disease 02/01/2017    Past Surgical History:  Procedure Laterality Date   ANTERIOR CERVICAL DECOMP/DISCECTOMY FUSION N/A 04/08/2013   Procedure: ANTERIOR CERVICAL DECOMPRESSION/DISCECTOMY FUSION 1 LEVEL;  Surgeon: Floyce Stakes, MD;  Location: [facility];  Service: Neurosurgery;  Laterality: N/A;  Cervical five-six Anterior cervical decompression/diskectomy fusion   APPENDECTOMY N/A 10/06/2019   Procedure: APPENDECTOMY;  Surgeon: Clovis Riley, MD;  Location: [place];  Service: General;  Laterality: N/A;   BIOPSY  01/30/2019   Procedure: BIOPSY;  Surgeon: Carol Ada, MD;  Location: [facility];  Service: Endoscopy;;   CERVICAL DISCECTOMY     x2   CHOLECYSTECTOMY     COLON RESECTION SIGMOID N/A 10/06/2019   Procedure: COLON RESECTION SIGMOID;  Surgeon: Clovis Riley, MD;  Location: [place];  Service: General;  Laterality: N/A;   ESOPHAGEAL DILATION  01/30/2019   Procedure: ESOPHAGEAL DILATION;  Surgeon: Carol Ada, MD;  Location: [facility];  Service: Endoscopy;;  [place]   ESOPHAGOGASTRODUODENOSCOPY (EGD) WITH PROPOFOL N/A 01/30/2019   Procedure: ESOPHAGOGASTRODUODENOSCOPY (EGD) WITH PROPOFOL;  Surgeon: Carol Ada, MD;  Location: [facility];  Service: Endoscopy;  Laterality: N/A;   gallstone removal     LAPAROTOMY N/A 10/06/2019   Procedure: EXPLORATORY LAPAROTOMY;  Surgeon: Clovis Riley, MD;  Location: [place] [facility];  Service: General;  Laterality: N/A;       Family History  Problem Relation  Age of Onset   Healthy Mother    Healthy Father    Thyroid disease Neg Hx     Social History   Tobacco Use   Smoking status: Former    Pack years: 0.00   Smokeless tobacco: Never  Vaping Use   Vaping Use: Never used  Substance Use Topics   Alcohol use: No   Drug use: No    Home Medications Prior to Admission medications   Medication Sig Start Date End Date Taking? Authorizing Provider  ondansetron (ZOFRAN ODT) 4 MG  disintegrating tablet Take 1 tablet (4 mg total) by mouth every 8 (eight) hours as needed for nausea or vomiting. 05/17/21  Yes Kent Riendeau A, PA-C  acetaminophen (TYLENOL) 500 MG tablet Take 2 tablets (1,000 mg total) by mouth every 8 (eight) hours. Patient taking differently: Take 1,000 mg by mouth every 8 (eight) hours as needed. pain 10/15/19   Earnstine Regal, PA-C  ALPRAZolam Duanne Moron) 0.5 MG tablet Take 1 tablet (0.5 mg total) by mouth 2 (two) times daily as needed for anxiety. Patient not taking: Reported on 10/08/2020 07/10/20   Raylene Everts, MD  alum & mag hydroxide-simeth (MAALOX/MYLANTA) 200-200-20 MG/5ML suspension Take 15 mLs by mouth every 6 (six) hours as needed for indigestion or heartburn. Patient not taking: Reported on 10/08/2020 06/19/19   Wieters, Hallie C, PA-C  amLODipine (NORVASC) 5 MG tablet Take 5 mg by mouth daily.    [provider]  aspirin EC 81 MG tablet Take 1 tablet (81 mg total) by mouth daily. Patient not taking: Reported on 10/08/2020 10/15/19   Earnstine Regal, PA-C  chlordiazePOXIDE (LIBRIUM) 25 MG capsule  10/17/20   [provider]  famotidine (PEPCID) 40 MG tablet 40 mg in the evening Patient not taking: Reported on 10/08/2020 04/05/20   Jiles Prows, MD  fluticasone [facility]) 50 MCG/ACT nasal spray 1 spray each nostril 1 time per day Patient not taking: Reported on 10/08/2020 04/05/20   Jiles Prows, MD  lamoTRIgine (LAMICTAL) 25 MG tablet Take 25 mg by mouth daily. 10/17/20   [provider]  loratadine (CLARITIN) 10 MG tablet 1-2 tablets 1 time per day Patient not taking: Reported on 10/08/2020 04/05/20   Jiles Prows, MD  montelukast (SINGULAIR) 10 MG tablet 1 tablet 1 time per day Patient not taking: Reported on 10/08/2020 04/05/20   Jiles Prows, MD  Multiple Vitamins-Iron (MULTIVITAMINS WITH IRON) TABS tablet You should get a Women's One a day vitamin with iron and take daily.  This will help with your low  hemoglobin.  You can buy this at any drug store, and choose whatever brand you like. Patient taking differently: Take 1 tablet by mouth daily.  10/15/19   Earnstine Regal, PA-C  pantoprazole (PROTONIX) 40 MG tablet Take 1 tablet (40 mg total) by mouth 2 (two) times daily. 04/05/20   Kozlow, Donnamarie Poag, MD  polyethylene glycol powder [facility]) 17 GM/SCOOP powder SMARTSIG:255 Gram(s) By Mouth Once 10/08/20   [provider]  rosuvastatin (CRESTOR) 10 MG tablet Take 10 mg by mouth daily. 03/01/20   [provider]  timolol (TIMOPTIC) 0.5 % ophthalmic solution 1 drop 2 (two) times daily. 11/23/20   [provider]  tiZANidine (ZANAFLEX) 2 MG tablet Take by mouth.    [provider]    Allergies    Alprazolam, Cyclobenzaprine, and Prednisone  Review of Systems   Review of Systems  Constitutional: Negative.   HENT: Negative.    Respiratory:  Negative.    Cardiovascular: Negative.   Gastrointestinal:  Positive for abdominal distention, abdominal pain (Resolved), constipation (Resolved) and nausea (Resolved).  Genitourinary: Negative.   Musculoskeletal: Negative.   Skin: Negative.   Neurological: Negative.   All other systems reviewed and are negative.  Physical Exam Updated Vital Signs BP (!) 146/103 (BP Location: Right Arm)   Pulse 64   Temp (!) 97.5 F (36.4 C)   Resp 18   SpO2 100%   Physical Exam Vitals and nursing note reviewed.  Constitutional:      General: He is not in acute distress.    Appearance: He is well-developed. He is not ill-appearing, toxic-appearing or diaphoretic.  HENT:     Head: Normocephalic and atraumatic.     Nose: Nose normal.     Mouth/Throat:     Mouth: Mucous membranes are moist.  Eyes:     Pupils: Pupils are equal, round, and reactive to light.  Cardiovascular:     Rate and Rhythm: Normal rate and regular rhythm.     Pulses: Normal pulses.     Heart sounds: Normal heart sounds.  Pulmonary:     Effort:  Pulmonary effort is normal. No respiratory distress.     Breath sounds: Normal breath sounds.     Comments: Clear to auscultation bilaterally.  Speaks in full sentences without difficulty. Abdominal:     General: There is distension.     Palpations: Abdomen is soft. There is no mass.     Tenderness: There is no abdominal tenderness. There is no right CVA tenderness, left CVA tenderness, guarding or rebound.     Hernia: No hernia is present.     Comments: Normoactive bowel sounds.  Abdomen some mild distention.  No rebound, guarding.  No abdominal tenderness with light or deep palpation.  Musculoskeletal:        General: No swelling, tenderness or signs of injury. Normal range of motion.     Cervical back: Normal range of motion and neck supple.     Right lower leg: No edema.     Left lower leg: No edema.  Skin:    General: Skin is warm and dry.     Capillary Refill: Capillary refill takes less than 2 seconds.  Neurological:     General: No focal deficit present.     Mental Status: He is alert and oriented to person, place, and time.    ED Results / Procedures / Treatments   Labs (all labs ordered are listed, but only abnormal results are displayed) Labs Reviewed  CBC WITH DIFFERENTIAL/PLATELET - Abnormal; Notable for the following components:      Result Value   WBC 3.1 (*)    Neutro Abs 1.3 (*)    All other components within normal limits  COMPREHENSIVE METABOLIC PANEL - Abnormal; Notable for the following components:   Creatinine, Ser 1.31 (*)    GFR, Estimated 55 (*)    All other components within normal limits  URINALYSIS, ROUTINE W REFLEX MICROSCOPIC - Abnormal; Notable for the following components:   Specific Gravity, Urine >1.046 (*)    Ketones, ur 5 (*)    Protein, ur 30 (*)    All other components within normal limits  LIPASE, BLOOD    EKG None  Radiology CT Abdomen Pelvis W Contrast  Result Date: 05/17/2021 CLINICAL DATA:  Abdominal distension. History of  perforated colon in 2020. EXAM: CT ABDOMEN AND PELVIS WITH CONTRAST TECHNIQUE: Multidetector CT imaging of the abdomen and pelvis  was performed using the standard protocol following bolus administration of intravenous contrast. CONTRAST:  54mL OMNIPAQUE IOHEXOL 300 MG/ML  SOLN COMPARISON:  10/08/2020 FINDINGS: Lower chest: Lung bases are clear. Hepatobiliary: Liver is normal.  Previous cholecystectomy. Pancreas: Normal Spleen: Normal Adrenals/Urinary Tract: Adrenal glands are normal. Kidneys are normal except for some tiny cysts. Bladder is normal. There is some intrusion of the prostate due to enlargement. Stomach/Bowel: Stomach and small intestine are normal. Normal appendix. Diverticulosis of the colon without evidence of diverticulitis. Previous anastomosis in the sigmoid region. Evidence of active colon pathology. The patient does not have an abnormal amount of fecal matter or gas. Vascular/Lymphatic: Aortic atherosclerosis. No aneurysm. IVC is normal. No adenopathy. Reproductive: Marked enlargement of the prostate gland, somewhat nodular in configuration as seen previously. Slight further generalized enlargement. Other: No free fluid or air. Musculoskeletal: Ordinary lumbar degenerative changes. Small left inguinal hernia. IMPRESSION: No acute bowel finding. No sign of obstruction or inflammatory disease. Previous anastomosis in the sigmoid colon region. Diverticulosis of the colon without evidence of diverticulitis. Previous cholecystectomy. Aortic atherosclerosis. Chronically enlarged prostate gland, showing slight further enlargement since the study last year. This could be due to benign or malignant prostate disease. Electronically Signed   By: Nelson Chimes M.D.   On: 05/17/2021 12:58   DG Abd 2 Views  Result Date: 05/17/2021 CLINICAL DATA:  Abdominal distension, constipation EXAM: ABDOMEN - 2 VIEW COMPARISON:  None. FINDINGS: The bowel gas pattern is normal. Moderate burden of stool throughout the left  and right colon. There is no evidence of free air. No radio-opaque calculi or other significant radiographic abnormality is seen. IMPRESSION: Nonobstructive bowel gas pattern. Moderate burden of stool throughout the left and right colon. Electronically Signed   By: Eddie Candle M.D.   On: 05/17/2021 10:33    Procedures Procedures   Medications Ordered in ED Medications  iohexol (OMNIPAQUE) 300 MG/ML solution 75 mL (75 mLs Intravenous Contrast Given 05/17/21 1219)    ED Course  I have reviewed the triage vital signs and the nursing notes.  Pertinent labs & imaging results that were available during my care of the patient were reviewed by me and considered in my medical decision making (see chart for details).   80 year old here for evaluation abdominal pain which has been intermittent since he had a colonoscopy with subsequent perforation in 2020.  He is afebrile, nonseptic, not ill-appearing.  Last bowel movement this morning which patient stated was "small."  He denies any melena or bright blood per rectum.  He is passing flatus.  Fortunately had a greater than 9-hour wait in the waiting room.  By the time of my exam abdomen is soft, nontender without rebound or guarding.  Patient's only complaint is that he is hungry.  He has no pain associated with food intake, low suspicion for mesenteric ischemia.  Labs and imaging personally reviewed and interpreted:  CBC WBC 3.1 CMP with creatinine 1.31 similar to prior Lipase 23 UA negative for infection CT AP with contrast without acute abnormality   Patient reassessed.  Continues benign abdominal exam.  He is tolerating p.o. intake at home.  He is requesting discharge. Will dc home with Zofran. He has Gi FU outpatient.   Patient is nontoxic, nonseptic appearing, in no apparent distress.  Patient's pain and other symptoms adequately managed in emergency department.  Fluid bolus given.  Labs, imaging and vitals reviewed.  Patient does not meet the  SIRS or Sepsis criteria.  On repeat exam patient  does not have a surgical abdomin and there are no peritoneal signs.  No indication of appendicitis, bowel obstruction, bowel perforation, cholecystitis, diverticulitis, hernia, AAA, dissection, mesenteric ischemia.    The patient has been appropriately medically screened and/or stabilized in the ED. I have low suspicion for any other emergent medical condition which would require further screening, evaluation or treatment in the ED or require inpatient management.  Patient is hemodynamically stable and in no acute distress.  Patient able to ambulate in department prior to ED.  Evaluation does not show acute pathology that would require ongoing or additional emergent interventions while in the emergency department or further inpatient treatment.  I have discussed the diagnosis with the patient and answered all questions.  Pain is been managed while in the emergency department and patient has no further complaints prior to discharge.  Patient is comfortable with plan discussed in room and is stable for discharge at this time.  I have discussed strict return precautions for returning to the emergency department.  Patient was encouraged to follow-up with PCP/specialist refer to at discharge.   Patient seen and evaluated by attending, Dr. Gilford Raid who agrees with above treatment, plan and disposition      MDM Rules/Calculators/A&P                           Final Clinical Impression(s) / ED Diagnoses Final diagnoses:  Abdominal discomfort  Nausea    Rx / DC Orders ED Discharge Orders          Ordered    ondansetron (ZOFRAN ODT) 4 MG disintegrating tablet  Every 8 hours PRN        05/17/21 1921             Vernie Vinciguerra A, PA-C 05/17/21 Roanna Banning, MD 05/17/21 2229

## 2021-05-17 NOTE — Discharge Instructions (Addendum)
Follow up with the gastroenterologist   Take the nausea medication as prescribed  Return for new or worsening symptoms

## 2021-05-17 NOTE — ED Provider Notes (Signed)
Emergency Medicine Provider Triage Evaluation Note  Shaquile Lutze , a 80 y.o. male  was evaluated in triage.  Pt complains of abdominal distention and discomfort x 6 months, worse this AM. Passed gas, but has been constipated.   Sent here from [place].   Review of Systems  Positive: Abd distention, discomfort, nausea, decreased appetite  Negative: Vomiting, fever   Physical Exam  There were no vitals taken for this visit. Gen:   Awake, no distress   Resp:  Normal effort  MSK:   Moves extremities without difficulty  Other:  Abdomen rigid with guarding and rebound. Distended.   Medical Decision Making  Medically screening exam initiated at 9:45 AM.  Appropriate orders placed.  Gerson Fauth was informed that the remainder of the evaluation will be completed by another provider, this initial triage assessment does not replace that evaluation, and the importance of remaining in the ED until their evaluation is complete.     Sherrill Raring, PA-C 05/17/21 1314    Charlesetta Shanks, MD 05/18/21 [phone]

## 2021-05-17 NOTE — ED Notes (Signed)
2 cups of water provided to pt. Pt ambulated to bathroom without need of assistance.

## 2021-05-17 NOTE — ED Triage Notes (Signed)
Pt sent down from UC with c/o abd bloating and and pain and some slight nausea

## 2021-05-17 NOTE — ED Notes (Signed)
D/c instructions reviewed and explained, pt verbalized understanding.

## 2021-05-17 NOTE — Progress Notes (Signed)
I placed a 22g Diffusics IV in pt.s LEFT antecubital space for his CT Abd/Pelvis. Pt. Was returned to the ED waiting room with IV in place. Mali Groce, RN notified.

## 2021-05-18 LAB — URINE CULTURE: Culture: NO GROWTH

## 2021-05-23 ENCOUNTER — Ambulatory Visit: Payer: Medicare HMO | Admitting: Podiatry

## 2021-05-23 ENCOUNTER — Encounter: Payer: Self-pay | Admitting: Podiatry

## 2021-05-23 ENCOUNTER — Other Ambulatory Visit: Payer: Self-pay

## 2021-05-23 DIAGNOSIS — M79675 Pain in left toe(s): Secondary | ICD-10-CM | POA: Diagnosis not present

## 2021-05-23 DIAGNOSIS — M79674 Pain in right toe(s): Secondary | ICD-10-CM

## 2021-05-23 DIAGNOSIS — B351 Tinea unguium: Secondary | ICD-10-CM

## 2021-05-24 NOTE — Progress Notes (Signed)
Subjective: 80 y.o. returns the office today for painful, elongated, thickened toenails which he  cannot trim himself. Denies any redness or drainage around the nails. The procedure site has been doing well. Denies any acute changes since last appointment and no new complaints today. Denies any systemic complaints such as fevers, chills, nausea, vomiting.   PCP: Randy Frees, MD  Objective: AAO 3, NAD DP/PT pulses palpable, CRT less than 3 seconds Nails hypertrophic, dystrophic, elongated, brittle, discolored 10. There is tenderness overlying the nails 1-5 bilaterally. There is no surrounding erythema or drainage along the nail sites. No open lesions or pre-ulcerative lesions are identified. No other areas of tenderness bilateral lower extremities. No overlying edema, erythema, increased warmth. No pain with calf compression, swelling, warmth, erythema.  Assessment: Patient presents with symptomatic onychomycosis  Plan: -Treatment options including alternatives, risks, complications were discussed -Nails sharply debrided 10 without complication/bleeding. -Discussed daily foot inspection. If there are any changes, to call the office immediately.  -Follow-up in 3 months or sooner if any problems are to arise. In the meantime, encouraged to call the office with any questions, concerns, changes symptoms.  Celesta Gentile, DPM

## 2021-06-27 ENCOUNTER — Ambulatory Visit (HOSPITAL_COMMUNITY)
Admission: EM | Admit: 2021-06-27 | Discharge: 2021-06-27 | Disposition: A | Discharge status: Home / Self Care | Payer: Medicare HMO | Attending: Family Medicine | Admitting: Family Medicine

## 2021-06-27 ENCOUNTER — Encounter (HOSPITAL_COMMUNITY): Payer: Self-pay | Admitting: *Deleted

## 2021-06-27 ENCOUNTER — Other Ambulatory Visit: Payer: Self-pay

## 2021-06-27 DIAGNOSIS — S161XXA Strain of muscle, fascia and tendon at neck level, initial encounter: Secondary | ICD-10-CM | POA: Diagnosis not present

## 2021-06-27 MED ORDER — DICLOFENAC SODIUM 1 % EX GEL: 2.0000 g | Freq: Four times a day (QID) | CUTANEOUS | 2 refills | Status: DC

## 2021-06-27 MED ORDER — TIZANIDINE HCL 2 MG PO TABS: 2.0000 mg | ORAL_TABLET | Freq: Three times a day (TID) | ORAL | 0 refills | Status: DC | PRN

## 2021-06-27 NOTE — ED Triage Notes (Signed)
Pt reports neck pain at same locations he had surgery over 10 years ago.

## 2021-06-27 NOTE — ED Provider Notes (Signed)
[place]    CSN: [id number] Arrival date & time: 06/27/21  [id number]      History   Chief Complaint Chief Complaint  Patient presents with   Torticollis    HPI Randy Merritt is a 80 y.o. male.   Patient presenting today with 5 to 6-day history of left-sided neck and upper back pain.  States pain started just after he mowed his grass with his riding lawnmower.  He states that he hit a small hill which jostled the mower and thinks this might be what irritated his neck.  Pain is worse when he is trying to nap in his chair or stay in certain positions for a long time.  Denies weakness, numbness, decreased range of motion in the left shoulder or arm, decreased range of motion at the neck, headache, dizziness, swelling.  So far taking Tylenol with minimal relief of symptoms.  States has had this issue in the past and was given a medicine at his doctor that helped with the symptoms.  Does not recall what medicine this was.  Past Medical History:  Diagnosis Date   Acid reflux    Anxiety    Arthritis    Depression    " a little"   Goiter    High cholesterol    History of blood transfusion    Perforated bowel (HCC)    Seasonal allergies    Patient Active Problem List   Diagnosis Date Noted   Chronic constipation 02/15/2021   Cervical radiculopathy 01/24/2021   History of cervical spinal arthrodesis 01/24/2021   Essential (primary) hypertension 01/16/2021   Neck pain 01/16/2021   Aphasia 07/01/2020   TIA (transient ischemic attack) 06/29/2020   Globus pharyngeus 05/20/2020   Goiter 12/24/2019   Perforated sigmoid colon ([place]) 10/06/2019   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/2019   Anxiety 04/14/2018   BPH (benign prostatic hyperplasia) 04/14/2018   CKD (chronic kidney disease) stage 3, GFR 30-59 ml/min ([place]) 04/14/2018   History of adenomatous polyp of colon 04/14/2018   Hyperlipidemia 04/14/2018   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   Ingrown toenail  10/11/2017   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   Oropharyngeal dysphagia 02/02/2017   Chronic rhinitis 02/01/2017   Gastroesophageal reflux disease 02/01/2017    Past Surgical History:  Procedure Laterality Date   ANTERIOR CERVICAL DECOMP/DISCECTOMY FUSION N/A 04/08/2013   Procedure: ANTERIOR CERVICAL DECOMPRESSION/DISCECTOMY FUSION 1 LEVEL;  Surgeon: Floyce Stakes, MD;  Location: [facility];  Service: Neurosurgery;  Laterality: N/A;  Cervical five-six Anterior cervical decompression/diskectomy fusion   APPENDECTOMY N/A 10/06/2019   Procedure: APPENDECTOMY;  Surgeon: Clovis Riley, MD;  Location: [place];  Service: General;  Laterality: N/A;   BIOPSY  01/30/2019   Procedure: BIOPSY;  Surgeon: Carol Ada, MD;  Location: [facility];  Service: Endoscopy;;   CERVICAL DISCECTOMY     x2   CHOLECYSTECTOMY     COLON RESECTION SIGMOID N/A 10/06/2019   Procedure: COLON RESECTION SIGMOID;  Surgeon: Clovis Riley, MD;  Location: [place];  Service: General;  Laterality: N/A;   ESOPHAGEAL DILATION  01/30/2019   Procedure: ESOPHAGEAL DILATION;  Surgeon: Carol Ada, MD;  Location: [facility];  Service: Endoscopy;;  [place]   ESOPHAGOGASTRODUODENOSCOPY (EGD) WITH PROPOFOL N/A 01/30/2019   Procedure: ESOPHAGOGASTRODUODENOSCOPY (EGD) WITH PROPOFOL;  Surgeon: Carol Ada, MD;  Location: [facility];  Service: Endoscopy;  Laterality: N/A;   gallstone removal     LAPAROTOMY N/A 10/06/2019   Procedure: EXPLORATORY LAPAROTOMY;  Surgeon: Clovis Riley, MD;  Location: [facility];  Service: General;  Laterality: N/A;       Home Medications    Prior to Admission medications   Medication Sig Start Date End Date Taking? Authorizing Provider  diclofenac Sodium (VOLTAREN) 1 % GEL Apply 2 g topically 4 (four) times daily. 06/27/21  Yes Volney American, PA-C  acetaminophen (TYLENOL) 500 MG tablet Take 2 tablets (1,000 mg total) by mouth every 8 (eight)  hours. Patient taking differently: Take 1,000 mg by mouth every 8 (eight) hours as needed. pain 10/15/19   Earnstine Regal, PA-C  ALPRAZolam Duanne Moron) 0.5 MG tablet Take 1 tablet (0.5 mg total) by mouth 2 (two) times daily as needed for anxiety. Patient not taking: Reported on 10/08/2020 07/10/20   Raylene Everts, MD  alum & mag hydroxide-simeth (MAALOX/MYLANTA) 200-200-20 MG/5ML suspension Take 15 mLs by mouth every 6 (six) hours as needed for indigestion or heartburn. Patient not taking: Reported on 10/08/2020 06/19/19   Wieters, Hallie C, PA-C  amLODipine (NORVASC) 5 MG tablet Take 5 mg by mouth daily.    [provider]  aspirin EC 81 MG tablet Take 1 tablet (81 mg total) by mouth daily. Patient not taking: Reported on 10/08/2020 10/15/19   Earnstine Regal, PA-C  chlordiazePOXIDE (LIBRIUM) 25 MG capsule  10/17/20   [provider]  famotidine (PEPCID) 40 MG tablet 40 mg in the evening Patient not taking: Reported on 10/08/2020 04/05/20   Jiles Prows, MD  fluticasone [facility]) 50 MCG/ACT nasal spray 1 spray each nostril 1 time per day Patient not taking: Reported on 10/08/2020 04/05/20   Jiles Prows, MD  lamoTRIgine (LAMICTAL) 25 MG tablet Take 25 mg by mouth daily. 10/17/20   [provider]  lansoprazole (PREVACID) 30 MG capsule  04/11/21   [provider]  loratadine (CLARITIN) 10 MG tablet 1-2 tablets 1 time per day Patient not taking: Reported on 10/08/2020 04/05/20   Jiles Prows, MD  montelukast (SINGULAIR) 10 MG tablet 1 tablet 1 time per day Patient not taking: Reported on 10/08/2020 04/05/20   Jiles Prows, MD  Multiple Vitamins-Iron (MULTIVITAMINS WITH IRON) TABS tablet You should get a Women's One a day vitamin with iron and take daily.  This will help with your low hemoglobin.  You can buy this at any drug store, and choose whatever brand you like. Patient taking differently: Take 1 tablet by mouth daily.  10/15/19   Earnstine Regal, PA-C   ondansetron (ZOFRAN ODT) 4 MG disintegrating tablet Take 1 tablet (4 mg total) by mouth every 8 (eight) hours as needed for nausea or vomiting. 05/17/21   Henderly, Britni A, PA-C  pantoprazole (PROTONIX) 40 MG tablet Take 1 tablet (40 mg total) by mouth 2 (two) times daily. 04/05/20   Kozlow, Donnamarie Poag, MD  polyethylene glycol powder [facility]) 17 GM/SCOOP powder SMARTSIG:255 Gram(s) By Mouth Once 10/08/20   [provider]  promethazine (PHENERGAN) 12.5 MG tablet Take 1 tablet (12.5 mg total) by mouth every 6 (six) hours as needed for nausea or vomiting. 05/17/21   Isla Pence, MD  rosuvastatin (CRESTOR) 10 MG tablet Take 10 mg by mouth daily. 03/01/20   [provider]  timolol (TIMOPTIC) 0.5 % ophthalmic solution 1 drop 2 (two) times daily. 11/23/20   [provider]  tiZANidine (ZANAFLEX) 2 MG tablet Take 1 tablet (2 mg total) by mouth every 8 (eight) hours as needed for muscle spasms. Do not drink alcohol or  drive while taking this medication.  May cause drowsiness. 06/27/21   Volney American, PA-C    Family History Family History  Problem Relation Age of Onset   Healthy Mother    Healthy Father    Thyroid disease Neg Hx     Social History Social History   Tobacco Use   Smoking status: Former   Smokeless tobacco: Never  Scientific laboratory technician Use: Never used  Substance Use Topics   Alcohol use: No   Drug use: No     Allergies   Alprazolam, Cyclobenzaprine, and Prednisone   Review of Systems Review of Systems Per HPI  Physical Exam Triage Vital Signs ED Triage Vitals [06/27/21 0852]  Enc Vitals Group     BP (!) 150/82     Pulse Rate 81     Resp 18     Temp 97.9 F (36.6 C)     Temp Source Oral     SpO2 100 %     Weight      Height      Head Circumference      Peak Flow      Pain Score 8     Pain Loc      Pain Edu?      Excl. in [place]?    No data found.  Updated Vital Signs BP (!) 150/82   Pulse 81   Temp 97.9 F (36.6  C) (Oral)   Resp 18   SpO2 100%   Visual Acuity Right Eye Distance:   Left Eye Distance:   Bilateral Distance:    Right Eye Near:   Left Eye Near:    Bilateral Near:     Physical Exam Vitals and nursing note reviewed.  Constitutional:      Appearance: Normal appearance.  HENT:     Head: Atraumatic.  Eyes:     Extraocular Movements: Extraocular movements intact.     Conjunctiva/sclera: Conjunctivae normal.  Cardiovascular:     Rate and Rhythm: Normal rate and regular rhythm.  Pulmonary:     Effort: Pulmonary effort is normal.     Breath sounds: Normal breath sounds.  Musculoskeletal:        General: Tenderness present. No swelling, deformity or signs of injury. Normal range of motion.     Cervical back: Normal range of motion and neck supple.     Comments: No midline cervical spinal tenderness to palpation and range of motion full and intact at C-spine.  Tender to palpation at insertion of left latissimus and trapezius Grip strength full and equal bilateral hands  Skin:    General: Skin is warm and dry.     Findings: No bruising, erythema or lesion.  Neurological:     General: No focal deficit present.     Mental Status: He is oriented to person, place, and time.     Motor: No weakness.     Gait: Gait normal.     Comments: Bilateral upper extremities neurovascularly intact  Psychiatric:        Mood and Affect: Mood normal.        Thought Content: Thought content normal.        Judgment: Judgment normal.     UC Treatments / Results  Labs (all labs ordered are listed, but only abnormal results are displayed) Labs Reviewed - No data to display  EKG   Radiology No results found.  Procedures Procedures (including critical care time)  Medications Ordered in UC Medications -  No data to display  Initial Impression / Assessment and Plan / UC Course  I have reviewed the triage vital signs and the nursing notes.  Pertinent labs & imaging results that were  available during my care of the patient were reviewed by me and considered in my medical decision making (see chart for details).     Suspect muscular strain causing his symptoms.  We will treat with low-dose Zanaflex as he is tolerated this in the past, precautions reviewed for sedation on this.  We will also give diclofenac gel given his borderline kidney function and discussed supportive home care, stretches.  Follow-up with PCP for recheck.  Final Clinical Impressions(s) / UC Diagnoses   Final diagnoses:  Strain of neck muscle, initial encounter   Discharge Instructions   None    ED Prescriptions     Medication Sig Dispense Auth. Provider   tiZANidine (ZANAFLEX) 2 MG tablet Take 1 tablet (2 mg total) by mouth every 8 (eight) hours as needed for muscle spasms. Do not drink alcohol or drive while taking this medication.  May cause drowsiness. 15 tablet Volney American, [name]   diclofenac Sodium (VOLTAREN) 1 % GEL Apply 2 g topically 4 (four) times daily. 150 g Volney American, [name]      PDMP not reviewed this encounter.   Volney American, [name] 06/27/21 [phone]

## 2021-07-01 ENCOUNTER — Emergency Department (HOSPITAL_COMMUNITY)
Admission: EM | Admit: 2021-07-01 | Discharge: 2021-07-01 | Disposition: A | Discharge status: Home / Self Care | Payer: Medicare HMO | Attending: Emergency Medicine | Admitting: Emergency Medicine

## 2021-07-01 ENCOUNTER — Other Ambulatory Visit: Payer: Self-pay

## 2021-07-01 ENCOUNTER — Emergency Department (HOSPITAL_COMMUNITY): Payer: Medicare HMO

## 2021-07-01 ENCOUNTER — Encounter (HOSPITAL_COMMUNITY): Payer: Self-pay

## 2021-07-01 DIAGNOSIS — N183 Chronic kidney disease, stage 3 unspecified: Secondary | ICD-10-CM | POA: Diagnosis not present

## 2021-07-01 DIAGNOSIS — Z79899 Other long term (current) drug therapy: Secondary | ICD-10-CM | POA: Diagnosis not present

## 2021-07-01 DIAGNOSIS — Z20822 Contact with and (suspected) exposure to covid-19: Secondary | ICD-10-CM | POA: Diagnosis not present

## 2021-07-01 DIAGNOSIS — M25512 Pain in left shoulder: Secondary | ICD-10-CM | POA: Insufficient documentation

## 2021-07-01 DIAGNOSIS — I129 Hypertensive chronic kidney disease with stage 1 through stage 4 chronic kidney disease, or unspecified chronic kidney disease: Secondary | ICD-10-CM | POA: Diagnosis not present

## 2021-07-01 DIAGNOSIS — D72819 Decreased white blood cell count, unspecified: Secondary | ICD-10-CM | POA: Insufficient documentation

## 2021-07-01 DIAGNOSIS — R0602 Shortness of breath: Secondary | ICD-10-CM | POA: Insufficient documentation

## 2021-07-01 DIAGNOSIS — Z87891 Personal history of nicotine dependence: Secondary | ICD-10-CM | POA: Diagnosis not present

## 2021-07-01 LAB — CBC WITH DIFFERENTIAL/PLATELET
Abs Immature Granulocytes: 0 10*3/uL (ref 0.00–0.07)
Basophils Absolute: 0 10*3/uL (ref 0.0–0.1)
Basophils Relative: 0 %
Eosinophils Absolute: 0 10*3/uL (ref 0.0–0.5)
Eosinophils Relative: 1 %
HCT: 43.5 % (ref 39.0–52.0)
Hemoglobin: 14.3 g/dL (ref 13.0–17.0)
Immature Granulocytes: 0 %
Lymphocytes Relative: 39 %
Lymphs Abs: 1.3 10*3/uL (ref 0.7–4.0)
MCH: 31.5 pg (ref 26.0–34.0)
MCHC: 32.9 g/dL (ref 30.0–36.0)
MCV: 95.8 fL (ref 80.0–100.0)
Monocytes Absolute: 0.3 10*3/uL (ref 0.1–1.0)
Monocytes Relative: 10 %
Neutro Abs: 1.6 10*3/uL — ABNORMAL LOW (ref 1.7–7.7)
Neutrophils Relative %: 50 %
Platelets: 160 10*3/uL (ref 150–400)
RBC: 4.54 MIL/uL (ref 4.22–5.81)
RDW: 13.2 % (ref 11.5–15.5)
WBC: 3.3 10*3/uL — ABNORMAL LOW (ref 4.0–10.5)
nRBC: 0 % (ref 0.0–0.2)

## 2021-07-01 LAB — COMPREHENSIVE METABOLIC PANEL
ALT: 24 U/L (ref 0–44)
AST: 24 U/L (ref 15–41)
Albumin: 4.4 g/dL (ref 3.5–5.0)
Alkaline Phosphatase: 99 U/L (ref 38–126)
Anion gap: 8 (ref 5–15)
BUN: 11 mg/dL (ref 8–23)
CO2: 29 mmol/L (ref 22–32)
Calcium: 10.1 mg/dL (ref 8.9–10.3)
Chloride: 107 mmol/L (ref 98–111)
Creatinine, Ser: 1.37 mg/dL — ABNORMAL HIGH (ref 0.61–1.24)
GFR, Estimated: 52 mL/min — ABNORMAL LOW (ref >60)
Glucose, Bld: 99 mg/dL (ref 70–99)
Potassium: 4.5 mmol/L (ref 3.5–5.1)
Sodium: 144 mmol/L (ref 135–145)
Total Bilirubin: 1.1 mg/dL (ref 0.3–1.2)
Total Protein: 7.6 g/dL (ref 6.5–8.1)

## 2021-07-01 LAB — RESP PANEL BY RT-PCR (FLU A&B, COVID) ARPGX2
Influenza A by PCR: NEGATIVE
Influenza B by PCR: NEGATIVE
SARS Coronavirus 2 by RT PCR: NEGATIVE

## 2021-07-01 LAB — BRAIN NATRIURETIC PEPTIDE: B Natriuretic Peptide: 26.9 pg/mL (ref 0.0–100.0)

## 2021-07-01 LAB — D-DIMER, QUANTITATIVE: D-Dimer, Quant: 0.57 ug/mL-FEU — ABNORMAL HIGH (ref 0.00–0.50)

## 2021-07-01 LAB — TROPONIN I (HIGH SENSITIVITY): Troponin I (High Sensitivity): 5 ng/L (ref <18)

## 2021-07-01 NOTE — Discharge Instructions (Addendum)
You were evaluated in the Emergency Department and after careful evaluation, we did not find any emergent condition requiring admission or further testing in the hospital.  Your exam/testing today was overall reassuring.  Please return to the Emergency Department if you experience any worsening of your condition.  Thank you for allowing us to be a part of your care.  

## 2021-07-01 NOTE — ED Triage Notes (Signed)
Pt reports Sob for months, worsening the last 2 days and left sided shoulder pain.

## 2021-07-01 NOTE — ED Provider Notes (Signed)
[facility] DEPT Provider Note   CSN: [id number] Arrival date & time: 07/01/21  [id number]     History Chief Complaint  Patient presents with   Shortness of Breath   Shoulder Pain    Randy Merritt is a 80 y.o. male.   Shortness of Breath Severity:  Moderate Onset quality:  Gradual Duration:  3 months Timing:  Intermittent Progression:  Worsening Chronicity:  Chronic Context: activity   Relieved by:  Sitting up Worsened by:  Activity Ineffective treatments:  None tried Associated symptoms: no abdominal pain, no chest pain, no cough, no fever, no headaches, no rash, no sore throat, no sputum production, no syncope, no vomiting and no wheezing   Shoulder Pain Associated symptoms: no fever    80 year old male with a history of GERD, TIA, anxiety, CKD, HTN, HLD, presenting with worsening shortness of breath.  The patient states that he has had intermittent shortness of breath for the past 3 months.  He describes worsening symptoms on exertion while walking.  He states that he gets more fatigued while walking and somewhat more dyspneic.  He additionally endorses worsening symptoms while lying flat.  He denies any chest pain.  Denies any fevers or chills.  He feels that his symptoms worsened over the past 2 days.  He also endorses left-sided shoulder discomfort that has been present during that same amount of time.  The pain is in his left shoulder and radiates down to his left arm.  He denies any trauma to the affected area.  Past Medical History:  Diagnosis Date   Acid reflux    Anxiety    Arthritis    Depression    " a little"   Goiter    High cholesterol    History of blood transfusion    Perforated bowel (HCC)    Seasonal allergies     Patient Active Problem List   Diagnosis Date Noted   Chronic constipation 02/15/2021   Cervical radiculopathy 01/24/2021   History of cervical spinal arthrodesis 01/24/2021   Essential (primary) hypertension  01/16/2021   Neck pain 01/16/2021   Aphasia 07/01/2020   TIA (transient ischemic attack) 06/29/2020   Globus pharyngeus 05/20/2020   Goiter 12/24/2019   Perforated sigmoid colon ([place]) 10/06/2019   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/2019   Anxiety 04/14/2018   BPH (benign prostatic hyperplasia) 04/14/2018   CKD (chronic kidney disease) stage 3, GFR 30-59 ml/min ([place]) 04/14/2018   History of adenomatous polyp of colon 04/14/2018   Hyperlipidemia 04/14/2018   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   Ingrown toenail 10/11/2017   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   Oropharyngeal dysphagia 02/02/2017   Chronic rhinitis 02/01/2017   Gastroesophageal reflux disease 02/01/2017    Past Surgical History:  Procedure Laterality Date   ANTERIOR CERVICAL DECOMP/DISCECTOMY FUSION N/A 04/08/2013   Procedure: ANTERIOR CERVICAL DECOMPRESSION/DISCECTOMY FUSION 1 LEVEL;  Surgeon: Floyce Stakes, MD;  Location: [facility];  Service: Neurosurgery;  Laterality: N/A;  Cervical five-six Anterior cervical decompression/diskectomy fusion   APPENDECTOMY N/A 10/06/2019   Procedure: APPENDECTOMY;  Surgeon: Clovis Riley, MD;  Location: [facility];  Service: General;  Laterality: N/A;   BIOPSY  01/30/2019   Procedure: BIOPSY;  Surgeon: Carol Ada, MD;  Location: [facility];  Service: Endoscopy;;   CERVICAL DISCECTOMY     x2   CHOLECYSTECTOMY     COLON RESECTION SIGMOID N/A 10/06/2019   Procedure: COLON RESECTION SIGMOID;  Surgeon: Clovis Riley, MD;  Location: [facility];  Service: General;  Laterality: N/A;   ESOPHAGEAL DILATION  01/30/2019   Procedure: ESOPHAGEAL DILATION;  Surgeon: Carol Ada, MD;  Location: [facility];  Service: Endoscopy;;  [place]   ESOPHAGOGASTRODUODENOSCOPY (EGD) WITH PROPOFOL N/A 01/30/2019   Procedure: ESOPHAGOGASTRODUODENOSCOPY (EGD) WITH PROPOFOL;  Surgeon: Carol Ada, MD;  Location: [facility];  Service: Endoscopy;  Laterality: N/A;    gallstone removal     LAPAROTOMY N/A 10/06/2019   Procedure: EXPLORATORY LAPAROTOMY;  Surgeon: Clovis Riley, MD;  Location: [facility];  Service: General;  Laterality: N/A;       Family History  Problem Relation Age of Onset   Healthy Mother    Healthy Father    Thyroid disease Neg Hx     Social History   Tobacco Use   Smoking status: Former   Smokeless tobacco: Never  Scientific laboratory technician Use: Never used  Substance Use Topics   Alcohol use: No   Drug use: No    Home Medications Prior to Admission medications   Medication Sig Start Date End Date Taking? Authorizing Provider  acetaminophen (TYLENOL) 500 MG tablet Take 2 tablets (1,000 mg total) by mouth every 8 (eight) hours. Patient taking differently: Take 1,000 mg by mouth every 8 (eight) hours as needed. pain 10/15/19   Earnstine Regal, PA-C  ALPRAZolam Duanne Moron) 0.5 MG tablet Take 1 tablet (0.5 mg total) by mouth 2 (two) times daily as needed for anxiety. Patient not taking: Reported on 10/08/2020 07/10/20   Raylene Everts, MD  alum & mag hydroxide-simeth (MAALOX/MYLANTA) 200-200-20 MG/5ML suspension Take 15 mLs by mouth every 6 (six) hours as needed for indigestion or heartburn. Patient not taking: Reported on 10/08/2020 06/19/19   Wieters, Hallie C, PA-C  amLODipine (NORVASC) 5 MG tablet Take 5 mg by mouth daily.    [provider]  aspirin EC 81 MG tablet Take 1 tablet (81 mg total) by mouth daily. Patient not taking: Reported on 10/08/2020 10/15/19   Earnstine Regal, PA-C  chlordiazePOXIDE (LIBRIUM) 25 MG capsule  10/17/20   [provider]  diclofenac Sodium (VOLTAREN) 1 % GEL Apply 2 g topically 4 (four) times daily. 06/27/21   Volney American, PA-C  famotidine (PEPCID) 40 MG tablet 40 mg in the evening Patient not taking: Reported on 10/08/2020 04/05/20   Jiles Prows, MD  fluticasone [facility]) 50 MCG/ACT nasal spray 1 spray each nostril 1 time per day Patient not taking: Reported on  10/08/2020 04/05/20   Jiles Prows, MD  lamoTRIgine (LAMICTAL) 25 MG tablet Take 25 mg by mouth daily. 10/17/20   [provider]  lansoprazole (PREVACID) 30 MG capsule  04/11/21   [provider]  loratadine (CLARITIN) 10 MG tablet 1-2 tablets 1 time per day Patient not taking: Reported on 10/08/2020 04/05/20   Jiles Prows, MD  montelukast (SINGULAIR) 10 MG tablet 1 tablet 1 time per day Patient not taking: Reported on 10/08/2020 04/05/20   Jiles Prows, MD  Multiple Vitamins-Iron (MULTIVITAMINS WITH IRON) TABS tablet You should get a Women's One a day vitamin with iron and take daily.  This will help with your low hemoglobin.  You can buy this at any drug store, and choose whatever brand you like. Patient taking differently: Take 1 tablet by mouth daily.  10/15/19   Earnstine Regal, PA-C  ondansetron (ZOFRAN ODT) 4 MG disintegrating tablet Take 1 tablet (4 mg total) by mouth every 8 (eight) hours as needed for nausea  or vomiting. 05/17/21   Henderly, Britni A, PA-C  pantoprazole (PROTONIX) 40 MG tablet Take 1 tablet (40 mg total) by mouth 2 (two) times daily. 04/05/20   Kozlow, Donnamarie Poag, MD  polyethylene glycol powder [facility]) 17 GM/SCOOP powder SMARTSIG:255 Gram(s) By Mouth Once 10/08/20   [provider]  promethazine (PHENERGAN) 12.5 MG tablet Take 1 tablet (12.5 mg total) by mouth every 6 (six) hours as needed for nausea or vomiting. 05/17/21   Isla Pence, MD  rosuvastatin (CRESTOR) 10 MG tablet Take 10 mg by mouth daily. 03/01/20   [provider]  timolol (TIMOPTIC) 0.5 % ophthalmic solution 1 drop 2 (two) times daily. 11/23/20   [provider]  tiZANidine (ZANAFLEX) 2 MG tablet Take 1 tablet (2 mg total) by mouth every 8 (eight) hours as needed for muscle spasms. Do not drink alcohol or drive while taking this medication.  May cause drowsiness. 06/27/21   Volney American, PA-C    Allergies    Alprazolam, Cyclobenzaprine, and  Prednisone  Review of Systems   Review of Systems  Constitutional:  Negative for fever.  HENT:  Negative for sore throat.   Respiratory:  Positive for shortness of breath. Negative for cough, sputum production and wheezing.   Cardiovascular:  Negative for chest pain, leg swelling and syncope.  Gastrointestinal:  Negative for abdominal pain and vomiting.  Skin:  Negative for rash.  Neurological:  Negative for headaches.  All other systems reviewed and are negative.  Physical Exam Updated Vital Signs BP (!) 148/82 (BP Location: Right Arm)   Pulse 73   Temp 98.2 F (36.8 C) (Oral)   Resp 17   SpO2 100%   Physical Exam Vitals and nursing note reviewed.  Constitutional:      Appearance: He is well-developed.  HENT:     Head: Normocephalic and atraumatic.  Eyes:     Conjunctiva/sclera: Conjunctivae normal.  Cardiovascular:     Rate and Rhythm: Normal rate and regular rhythm.     Heart sounds: No murmur heard. Pulmonary:     Effort: Pulmonary effort is normal. No respiratory distress.     Breath sounds: Normal breath sounds. No decreased breath sounds, wheezing, rhonchi or rales.  Chest:     Chest wall: No tenderness.  Abdominal:     Palpations: Abdomen is soft.     Tenderness: There is no abdominal tenderness.  Musculoskeletal:     Cervical back: Neck supple.     Right lower leg: No edema.     Left lower leg: No edema.     Comments: No tenderness palpation of the left shoulder, intact range of motion  Skin:    General: Skin is warm and dry.  Neurological:     Mental Status: He is alert.    ED Results / Procedures / Treatments   Labs (all labs ordered are listed, but only abnormal results are displayed) Labs Reviewed  COMPREHENSIVE METABOLIC PANEL - Abnormal; Notable for the following components:      Result Value   Creatinine, Ser 1.37 (*)    GFR, Estimated 52 (*)    All other components within normal limits  CBC WITH DIFFERENTIAL/PLATELET - Abnormal; Notable  for the following components:   WBC 3.3 (*)    Neutro Abs 1.6 (*)    All other components within normal limits  D-DIMER, QUANTITATIVE - Abnormal; Notable for the following components:   D-Dimer, Quant 0.57 (*)    All other components within normal limits  RESP PANEL BY RT-PCR (FLU A&B, COVID) ARPGX2  BRAIN NATRIURETIC PEPTIDE  TROPONIN I (HIGH SENSITIVITY)    EKG EKG Interpretation  Date/Time:  Saturday July 01 2021 13:08:49 EDT Ventricular Rate:  67 PR Interval:  150 QRS Duration: 80 QT Interval:  378 QTC Calculation: 399 R Axis:   69 Text Interpretation: Sinus rhythm Consider left atrial enlargement Borderline ECG No significant change since last tracing Confirmed by Regan Lemming ([id number]) on 07/02/2021 8:07:34 PM  Radiology DG Chest 2 View  Result Date: 07/01/2021 CLINICAL DATA:  Shortness of breath in an 80 year old male. EXAM: CHEST - 2 VIEW COMPARISON:  No prior chest x-ray. FINDINGS: Trachea is mildly deviated to the LEFT in this patient with known thyromegaly/multinodular Araceli Bouche ir. Cardiomediastinal contours and hilar structures are normal. Lungs are clear.  No visible pneumothorax.  No effusion. On limited assessment there is no acute skeletal process. IMPRESSION: No active cardiopulmonary disease. Electronically Signed   By: Zetta Bills M.D.   On: 07/01/2021 08:35    Procedures Procedures   Medications Ordered in ED Medications - No data to display  ED Course  I have reviewed the triage vital signs and the nursing notes.  Pertinent labs & imaging results that were available during my care of the patient were reviewed by me and considered in my medical decision making (see chart for details).    MDM Rules/Calculators/A&P                           80 year old male with a history of GERD, TIA, anxiety, CKD, HTN, HLD, presenting with worsening shortness of breath.  The patient states that he has had intermittent shortness of breath for the past 3 months.  He  describes worsening symptoms on exertion while walking.  He states that he gets more fatigued while walking and somewhat more dyspneic.  He additionally endorses worsening symptoms while lying flat.  He denies any chest pain.  Denies any fevers or chills.  He feels that his symptoms worsened over the past 2 days.  He also endorses left-sided shoulder discomfort that has been present during that same amount of time.  The pain is in his left shoulder and radiates down to his left arm.  He denies any trauma to the affected area.  On arrival, the patient was afebrile, mildly hypertensive, not tachycardic or tachypneic, saturating well on room air.  He presents with shortness of breath for the past several months with worsening symptoms over the past 2 days.  Given the chronicity of his symptoms, do not suspect acute coronary syndrome.  He denies any overt substernal or left-sided chest discomfort but does endorse left-sided shoulder discomfort.  He has intact range of motion and no significant tenderness of the shoulder.  On exam, the patient had no wheezing, rales or rhonchi with lungs clear to auscultation bilaterally.  Bedside ultrasound did not reveal evidence of a pericardial effusion.  EKG was performed which revealed normal sinus rhythm, no STEMI, no significant change from prior EKGs.  Chest x-ray revealed no active cardiopulmonary disease.  No pneumothorax or focal consolidation to suggest bacterial pneumonia.  A CBC revealed a leukopenia with a white blood cell count of 3.3, no anemia with a hemoglobin of 14.3.  CMP was grossly unremarkable, creatinine mildly elevated at 1.37, appears at baseline.  No other abnormalities.  Initial troponin was negative and a BNP was normal.  A D-dimer after adjustment for age was also normal.  Very low suspicion for PE given negative D-dimer.  Low suspicion for ACS given chronicity of the patient's symptoms and duration of patient's symptoms with a negative troponin, do  not feel that a repeat troponin is warranted.  His EKG was without ischemic changes.  His chest x-ray revealed no acute abnormality.  His COVID-19 and influenza testing was also normal.  No lower extremity edema, evidence of pulmonary edema or elevated BNP to suggest CHF.  The patient does endorse symptoms which could be consistent with worsening heart failure.  His last echocardiogram was in August 2021 which revealed a left ventricular EF 70 to 75%, hyperdynamic function, diastolic dysfunction parameters indeterminate.  No significant valvular abnormality noted.  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  No aortic valve stenosis, trivial aortic regurgitation.  No evidence of pulmonary hypertension.  Given the patient's progressive dyspnea, I recommended that he follow-up with his PCP for consideration for repeat outpatient echo.  I do not see an indication for hospital admission at this time given the patient's current negative work-up.  The patient was ambulatory in the emergency department with no hypoxia and reassuring vitals.  Overall stable for discharge and continued outpatient management of the patient's chronic symptoms. Final Clinical Impression(s) / ED Diagnoses Final diagnoses:  Shortness of breath    Rx / DC Orders ED Discharge Orders     None        Regan Lemming, MD 07/02/21 2015

## 2021-07-08 ENCOUNTER — Emergency Department (HOSPITAL_COMMUNITY): Payer: Medicare HMO

## 2021-07-08 ENCOUNTER — Other Ambulatory Visit: Payer: Self-pay

## 2021-07-08 ENCOUNTER — Emergency Department (HOSPITAL_COMMUNITY)
Admission: EM | Admit: 2021-07-08 | Discharge: 2021-07-08 | Disposition: A | Discharge status: Home / Self Care | Payer: Medicare HMO | Attending: Emergency Medicine | Admitting: Emergency Medicine

## 2021-07-08 ENCOUNTER — Encounter (HOSPITAL_COMMUNITY): Payer: Self-pay

## 2021-07-08 DIAGNOSIS — Z7982 Long term (current) use of aspirin: Secondary | ICD-10-CM | POA: Diagnosis not present

## 2021-07-08 DIAGNOSIS — Z20822 Contact with and (suspected) exposure to covid-19: Secondary | ICD-10-CM | POA: Diagnosis not present

## 2021-07-08 DIAGNOSIS — Z79899 Other long term (current) drug therapy: Secondary | ICD-10-CM | POA: Insufficient documentation

## 2021-07-08 DIAGNOSIS — Z87891 Personal history of nicotine dependence: Secondary | ICD-10-CM | POA: Insufficient documentation

## 2021-07-08 DIAGNOSIS — R1310 Dysphagia, unspecified: Secondary | ICD-10-CM

## 2021-07-08 DIAGNOSIS — K219 Gastro-esophageal reflux disease without esophagitis: Secondary | ICD-10-CM | POA: Insufficient documentation

## 2021-07-08 DIAGNOSIS — R110 Nausea: Secondary | ICD-10-CM | POA: Diagnosis not present

## 2021-07-08 DIAGNOSIS — I129 Hypertensive chronic kidney disease with stage 1 through stage 4 chronic kidney disease, or unspecified chronic kidney disease: Secondary | ICD-10-CM | POA: Insufficient documentation

## 2021-07-08 DIAGNOSIS — N183 Chronic kidney disease, stage 3 unspecified: Secondary | ICD-10-CM | POA: Insufficient documentation

## 2021-07-08 DIAGNOSIS — R1084 Generalized abdominal pain: Secondary | ICD-10-CM | POA: Insufficient documentation

## 2021-07-08 LAB — COMPREHENSIVE METABOLIC PANEL
ALT: 34 U/L (ref 0–44)
AST: 30 U/L (ref 15–41)
Albumin: 4.2 g/dL (ref 3.5–5.0)
Alkaline Phosphatase: 93 U/L (ref 38–126)
Anion gap: 7 (ref 5–15)
BUN: 13 mg/dL (ref 8–23)
CO2: 28 mmol/L (ref 22–32)
Calcium: 9.4 mg/dL (ref 8.9–10.3)
Chloride: 103 mmol/L (ref 98–111)
Creatinine, Ser: 1.31 mg/dL — ABNORMAL HIGH (ref 0.61–1.24)
GFR, Estimated: 55 mL/min — ABNORMAL LOW (ref >60)
Glucose, Bld: 114 mg/dL — ABNORMAL HIGH (ref 70–99)
Potassium: 3.7 mmol/L (ref 3.5–5.1)
Sodium: 138 mmol/L (ref 135–145)
Total Bilirubin: 1 mg/dL (ref 0.3–1.2)
Total Protein: 6.9 g/dL (ref 6.5–8.1)

## 2021-07-08 LAB — LIPASE, BLOOD: Lipase: 27 U/L (ref 11–51)

## 2021-07-08 LAB — CBC WITH DIFFERENTIAL/PLATELET
Abs Immature Granulocytes: 0.01 10*3/uL (ref 0.00–0.07)
Basophils Absolute: 0 10*3/uL (ref 0.0–0.1)
Basophils Relative: 1 %
Eosinophils Absolute: 0.1 10*3/uL (ref 0.0–0.5)
Eosinophils Relative: 2 %
HCT: 41.9 % (ref 39.0–52.0)
Hemoglobin: 14.1 g/dL (ref 13.0–17.0)
Immature Granulocytes: 0 %
Lymphocytes Relative: 32 %
Lymphs Abs: 1.4 10*3/uL (ref 0.7–4.0)
MCH: 32.1 pg (ref 26.0–34.0)
MCHC: 33.7 g/dL (ref 30.0–36.0)
MCV: 95.4 fL (ref 80.0–100.0)
Monocytes Absolute: 0.5 10*3/uL (ref 0.1–1.0)
Monocytes Relative: 11 %
Neutro Abs: 2.3 10*3/uL (ref 1.7–7.7)
Neutrophils Relative %: 54 %
Platelets: 168 10*3/uL (ref 150–400)
RBC: 4.39 MIL/uL (ref 4.22–5.81)
RDW: 12.9 % (ref 11.5–15.5)
WBC: 4.2 10*3/uL (ref 4.0–10.5)
nRBC: 0 % (ref 0.0–0.2)

## 2021-07-08 LAB — URINALYSIS, ROUTINE W REFLEX MICROSCOPIC
Bilirubin Urine: NEGATIVE
Glucose, UA: NEGATIVE mg/dL
Hgb urine dipstick: NEGATIVE
Ketones, ur: NEGATIVE mg/dL
Leukocytes,Ua: NEGATIVE
Nitrite: NEGATIVE
Protein, ur: NEGATIVE mg/dL
Specific Gravity, Urine: 1.011 (ref 1.005–1.030)
pH: 5 (ref 5.0–8.0)

## 2021-07-08 LAB — RESP PANEL BY RT-PCR (FLU A&B, COVID) ARPGX2
Influenza A by PCR: NEGATIVE
Influenza B by PCR: NEGATIVE
SARS Coronavirus 2 by RT PCR: NEGATIVE

## 2021-07-08 MED ORDER — ONDANSETRON 4 MG PO TBDP: 4.0000 mg | ORAL_TABLET | Freq: Three times a day (TID) | ORAL | 0 refills | Status: DC | PRN

## 2021-07-08 MED ORDER — SODIUM CHLORIDE 0.9 % IV BOLUS
1000.0000 mL | Freq: Once | INTRAVENOUS | Status: AC
  Administered 2021-07-08: 1000 mL via INTRAVENOUS

## 2021-07-08 MED ORDER — ONDANSETRON HCL 4 MG/2ML IJ SOLN
4.0000 mg | Freq: Once | INTRAMUSCULAR | Status: AC
  Administered 2021-07-08: 4 mg via INTRAVENOUS
  Filled 2021-07-08: qty 2

## 2021-07-08 MED ORDER — IOHEXOL 350 MG/ML SOLN
80.0000 mL | Freq: Once | INTRAVENOUS | Status: AC | PRN
  Administered 2021-07-08: 80 mL via INTRAVENOUS

## 2021-07-08 NOTE — ED Triage Notes (Signed)
Pt was just started on duloxetine and chlordiazepoxide. Today pt reports abdominal pain, nausea, diarrhea, and difficulty swallowing that began today.

## 2021-07-08 NOTE — ED Provider Notes (Signed)
[facility] DEPT Provider Note   CSN: [id number] Arrival date & time: 07/08/21  1631     History Chief Complaint  Patient presents with   Medication Reaction    Malhar Montemarano is a 80 y.o. male.  HPI     80 year old male with a history of hyperlipidemia, TIA, BPH, constipation, GERD, perforated sigmoid colon, hypertension, multinodular goiter and chronic dysphagia followed by Dr. Wilburn Cornelia ENT, who presents with concern for "feeling sick", describing nausea, abdominal discomfort, inability to eat due to difficulty swallowing.  He reports the swallowing issues have been going on for a long time, mother reports he has a slow worsening recently, and just reports he is just not feeling well.  He feels he has been more forgetful, and have discussed this in May at his ENT physicians appointment.  Review of the chart show an appointment with Dr. Wilburn Cornelia in May with discussion that he had been seen previously in the fall and had a work-up including a barium swallow study which showed no evidence of obstruction, mass or tumor, benign multinodular goiter without acute symptoms, reports that he had been followed and treated for gastroesophageal reflux disorder.  He had a recent evaluation in the emergency department 1 week ago for shortness of breath, had a negative D-dimer, and recommendation for repeat echocardiogram as an outpatient given his progressive dyspnea.  He denies significant dyspnea on my history.  Reports that he has had more nausea and difficulty eating.  Describes fatigue.  Denies significant abdominal pain, but does report some discomfort.  He reports that he is able to swallow, but has difficulty.  Reports he has been seeing ENT for this.  Denies diarrhea, black or bloody stools.  Denies chest pain or dyspnea.  Denies fevers or cough.  Past Medical History:  Diagnosis Date   Acid reflux    Anxiety    Arthritis    Depression    " a little"    Goiter    High cholesterol    History of blood transfusion    Perforated bowel (HCC)    Seasonal allergies     Patient Active Problem List   Diagnosis Date Noted   Chronic constipation 02/15/2021   Cervical radiculopathy 01/24/2021   History of cervical spinal arthrodesis 01/24/2021   Essential (primary) hypertension 01/16/2021   Neck pain 01/16/2021   Aphasia 07/01/2020   TIA (transient ischemic attack) 06/29/2020   Globus pharyngeus 05/20/2020   Goiter 12/24/2019   Perforated sigmoid colon ([place]) 10/06/2019   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/2019   Anxiety 04/14/2018   BPH (benign prostatic hyperplasia) 04/14/2018   CKD (chronic kidney disease) stage 3, GFR 30-59 ml/min ([place]) 04/14/2018   History of adenomatous polyp of colon 04/14/2018   Hyperlipidemia 04/14/2018   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   Ingrown toenail 10/11/2017   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   Oropharyngeal dysphagia 02/02/2017   Chronic rhinitis 02/01/2017   Gastroesophageal reflux disease 02/01/2017    Past Surgical History:  Procedure Laterality Date   ANTERIOR CERVICAL DECOMP/DISCECTOMY FUSION N/A 04/08/2013   Procedure: ANTERIOR CERVICAL DECOMPRESSION/DISCECTOMY FUSION 1 LEVEL;  Surgeon: Floyce Stakes, MD;  Location: [facility];  Service: Neurosurgery;  Laterality: N/A;  Cervical five-six Anterior cervical decompression/diskectomy fusion   APPENDECTOMY N/A 10/06/2019   Procedure: APPENDECTOMY;  Surgeon: Clovis Riley, MD;  Location: [facility];  Service: General;  Laterality: N/A;   BIOPSY  01/30/2019   Procedure: BIOPSY;  Surgeon: Benson Norway,  Saralyn Pilar, MD;  Location: [facility];  Service: Endoscopy;;   CERVICAL DISCECTOMY     x2   CHOLECYSTECTOMY     COLON RESECTION SIGMOID N/A 10/06/2019   Procedure: COLON RESECTION SIGMOID;  Surgeon: Clovis Riley, MD;  Location: [facility];  Service: General;  Laterality: N/A;   ESOPHAGEAL DILATION  01/30/2019    Procedure: ESOPHAGEAL DILATION;  Surgeon: Carol Ada, MD;  Location: [facility];  Service: Endoscopy;;  [place]   ESOPHAGOGASTRODUODENOSCOPY (EGD) WITH PROPOFOL N/A 01/30/2019   Procedure: ESOPHAGOGASTRODUODENOSCOPY (EGD) WITH PROPOFOL;  Surgeon: Carol Ada, MD;  Location: [facility];  Service: Endoscopy;  Laterality: N/A;   gallstone removal     LAPAROTOMY N/A 10/06/2019   Procedure: EXPLORATORY LAPAROTOMY;  Surgeon: Clovis Riley, MD;  Location: [facility] OR;  Service: General;  Laterality: N/A;       Family History  Problem Relation Age of Onset   Healthy Mother    Healthy Father    Thyroid disease Neg Hx     Social History   Tobacco Use   Smoking status: Former   Smokeless tobacco: Never  Scientific laboratory technician Use: Never used  Substance Use Topics   Alcohol use: No   Drug use: No    Home Medications Prior to Admission medications   Medication Sig Start Date End Date Taking? Authorizing Provider  acetaminophen (TYLENOL) 500 MG tablet Take 2 tablets (1,000 mg total) by mouth every 8 (eight) hours. Patient taking differently: Take 1,000 mg by mouth every 8 (eight) hours as needed. pain 10/15/19   Earnstine Regal, PA-C  ALPRAZolam Duanne Moron) 0.5 MG tablet Take 1 tablet (0.5 mg total) by mouth 2 (two) times daily as needed for anxiety. Patient not taking: Reported on 10/08/2020 07/10/20   Raylene Everts, MD  alum & mag hydroxide-simeth (MAALOX/MYLANTA) 200-200-20 MG/5ML suspension Take 15 mLs by mouth every 6 (six) hours as needed for indigestion or heartburn. Patient not taking: Reported on 10/08/2020 06/19/19   Wieters, Hallie C, PA-C  amLODipine (NORVASC) 5 MG tablet Take 5 mg by mouth daily.    [provider]  aspirin EC 81 MG tablet Take 1 tablet (81 mg total) by mouth daily. Patient not taking: Reported on 10/08/2020 10/15/19   Earnstine Regal, PA-C  chlordiazePOXIDE (LIBRIUM) 25 MG capsule  10/17/20   [provider]  diclofenac Sodium (VOLTAREN) 1  % GEL Apply 2 g topically 4 (four) times daily. 06/27/21   Volney American, PA-C  famotidine (PEPCID) 40 MG tablet 40 mg in the evening Patient not taking: Reported on 10/08/2020 04/05/20   Jiles Prows, MD  fluticasone [facility]) 50 MCG/ACT nasal spray 1 spray each nostril 1 time per day Patient not taking: Reported on 10/08/2020 04/05/20   Jiles Prows, MD  lamoTRIgine (LAMICTAL) 25 MG tablet Take 25 mg by mouth daily. 10/17/20   [provider]  lansoprazole (PREVACID) 30 MG capsule  04/11/21   [provider]  loratadine (CLARITIN) 10 MG tablet 1-2 tablets 1 time per day Patient not taking: Reported on 10/08/2020 04/05/20   Jiles Prows, MD  montelukast (SINGULAIR) 10 MG tablet 1 tablet 1 time per day Patient not taking: Reported on 10/08/2020 04/05/20   Jiles Prows, MD  Multiple Vitamins-Iron (MULTIVITAMINS WITH IRON) TABS tablet You should get a Women's One a day vitamin with iron and take daily.  This will help with your low hemoglobin.  You can buy this at any drug store, and choose whatever  brand you like. Patient taking differently: Take 1 tablet by mouth daily.  10/15/19   Earnstine Regal, PA-C  ondansetron (ZOFRAN ODT) 4 MG disintegrating tablet Take 1 tablet (4 mg total) by mouth every 8 (eight) hours as needed for nausea or vomiting. 07/08/21   Gareth Morgan, MD  pantoprazole (PROTONIX) 40 MG tablet Take 1 tablet (40 mg total) by mouth 2 (two) times daily. 04/05/20   Kozlow, Donnamarie Poag, MD  polyethylene glycol powder [facility]) 17 GM/SCOOP powder SMARTSIG:255 Gram(s) By Mouth Once 10/08/20   [provider]  promethazine (PHENERGAN) 12.5 MG tablet Take 1 tablet (12.5 mg total) by mouth every 6 (six) hours as needed for nausea or vomiting. 05/17/21   Isla Pence, MD  rosuvastatin (CRESTOR) 10 MG tablet Take 10 mg by mouth daily. 03/01/20   [provider]  timolol (TIMOPTIC) 0.5 % ophthalmic solution 1 drop 2 (two) times daily. 11/23/20    [provider]  tiZANidine (ZANAFLEX) 2 MG tablet Take 1 tablet (2 mg total) by mouth every 8 (eight) hours as needed for muscle spasms. Do not drink alcohol or drive while taking this medication.  May cause drowsiness. 06/27/21   Volney American, PA-C    Allergies    Alprazolam, Cyclobenzaprine, and Prednisone  Review of Systems   Review of Systems  Constitutional:  Negative for fever.  HENT:  Positive for trouble swallowing. Negative for sore throat.   Eyes:  Negative for visual disturbance.  Respiratory:  Negative for shortness of breath.   Cardiovascular:  Negative for chest pain.  Gastrointestinal:  Positive for abdominal pain, nausea and vomiting.  Genitourinary:  Negative for difficulty urinating.  Musculoskeletal:  Negative for back pain and neck stiffness.  Skin:  Negative for rash.  Neurological:  Negative for syncope and headaches.   Physical Exam Updated Vital Signs BP 138/79   Pulse 74   Temp 98.5 F (36.9 C) (Oral)   Resp 18   SpO2 95%   Physical Exam Vitals and nursing note reviewed.  Constitutional:      General: He is not in acute distress.    Appearance: He is well-developed. He is not diaphoretic.  HENT:     Head: Normocephalic and atraumatic.  Eyes:     Conjunctiva/sclera: Conjunctivae normal.  Cardiovascular:     Rate and Rhythm: Normal rate and regular rhythm.     Heart sounds: Normal heart sounds. No murmur heard.   No friction rub. No gallop.  Pulmonary:     Effort: Pulmonary effort is normal. No respiratory distress.     Breath sounds: Normal breath sounds. No wheezing or rales.  Abdominal:     General: There is no distension.     Palpations: Abdomen is soft.     Tenderness: There is abdominal tenderness (diffuse). There is no guarding.  Musculoskeletal:     Cervical back: Normal range of motion.  Skin:    General: Skin is warm and dry.  Neurological:     Mental Status: He is alert and oriented to person, place, and  time.    ED Results / Procedures / Treatments   Labs (all labs ordered are listed, but only abnormal results are displayed) Labs Reviewed  COMPREHENSIVE METABOLIC PANEL - Abnormal; Notable for the following components:      Result Value   Glucose, Bld 114 (*)    Creatinine, Ser 1.31 (*)    GFR, Estimated 55 (*)    All other components within normal limits  RESP  PANEL BY RT-PCR (FLU A&B, COVID) ARPGX2  CBC WITH DIFFERENTIAL/PLATELET  URINALYSIS, ROUTINE W REFLEX MICROSCOPIC  LIPASE, BLOOD    EKG None  Radiology CT Soft Tissue Neck W Contrast  Result Date: 07/08/2021 CLINICAL DATA:  Initial evaluation for neck swelling, evaluate for abscess. EXAM: CT NECK WITH CONTRAST TECHNIQUE: Multidetector CT imaging of the neck was performed using the standard protocol following the bolus administration of intravenous contrast. CONTRAST:  50m OMNIPAQUE IOHEXOL 350 MG/ML SOLN COMPARISON:  Prior thyroid ultrasound from 10/03/2020. FINDINGS: Pharynx and larynx: Oral cavity within normal limits. No acute inflammatory changes seen about the dentition. No base of tongue lesion. Oropharynx and nasopharynx within normal limits. No retropharyngeal collection or swelling. Epiglottis normal. Vallecula clear. Remainder of the hypopharynx and supraglottic larynx within normal limits. Glottis normal. Subglottic airway mildly narrowed and deviated to the left by a large multinodular thyroid. Salivary glands: Salivary glands including the parotid and submandibular glands are within normal limits. Thyroid: Enlarged multinodular goiter, with the right lobe of thyroid larger than the left. Overall appearance is similar to previous exams. This has been previously evaluated with thyroid ultrasound and biopsy. Lymph nodes: No enlarged or pathologic adenopathy within the neck. Vascular: Normal intravascular enhancement seen throughout the neck. Lateral deviation of the proximal common carotid arteries by the enlarged  multinodular goiter. Limited intracranial: Unremarkable. Visualized orbits: Unremarkable. Mastoids and visualized paranasal sinuses: Visualized paranasal sinuses are clear. Mastoid air cells and middle ear cavities are well pneumatized and free fluid. Skeleton: No visible acute osseous finding. No discrete or worrisome osseous lesions. Bulky bridging osteophytic spurring noted throughout the cervical and visualized upper thoracic spine, suggestive of dish. Ossification of stylohyoid ligaments noted bilaterally. Prior cervical fusion at C3-4 and C5-6, anterior plate screw fixation at C5-6. Upper chest: Visualized upper chest demonstrates no acute finding. Partially visualized lungs are clear. Other: None. IMPRESSION: 1. No acute abnormality identified within the neck. No acute inflammatory changes or abscess. 2. Enlarged multinodular goiter, similar to previous exams. This has been previously evaluated with thyroid ultrasound and biopsy. Please refer to prior results for any potential follow-up recommendations regarding this finding (ref: J Am Coll Radiol. 2015 Feb;12(2): 143-50). 3. Bulky bridging osteophytic spurring throughout the cervical and visualized upper thoracic spine, suggestive of DISH. 4. 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 Electronically Signed   By: BJeannine BogaM.D.   On: 07/08/2021 20:20   CT ABDOMEN PELVIS W CONTRAST  Result Date: 07/08/2021 CLINICAL DATA:  Abdominal pain, acute, nonlocalized EXAM: CT ABDOMEN AND PELVIS WITH CONTRAST TECHNIQUE: Multidetector CT imaging of the abdomen and pelvis was performed using the standard protocol following bolus administration of intravenous contrast. CONTRAST:  858mOMNIPAQUE IOHEXOL 350 MG/ML SOLN COMPARISON:  03/17/2021 FINDINGS: Lower chest: No acute abnormality. Hepatobiliary: Subcentimeter hypodense lesion of the LEFT liver is too small to accurately characterize (series 7, image 40).  Portal vein is patent. Status post cholecystectomy. No biliary dilatation. Pancreas: Unremarkable. No pancreatic ductal dilatation or surrounding inflammatory changes. Spleen: Normal in size without focal abnormality. Adrenals/Urinary Tract: Adrenal glands are unremarkable. Kidneys enhance symmetrically. No hydronephrosis. Subcentimeter hypodense lesions are too small to accurately characterize. Circumferential bladder wall thickening, likely reflecting the sequela of chronic outlet obstruction. Stomach/Bowel: Status post partial sigmoid resection. No evidence of bowel obstruction. Status post appendectomy. Scattered diverticulosis without evidence of acute diverticulitis. Vascular/Lymphatic: Vascular calcifications. No suspicious lymphadenopathy. Reproductive: Prostatomegaly with median lobe hypertrophy. Other: Fat containing LEFT inguinal hernia.  Musculoskeletal: Degenerative changes of the lumbar spine. Bone island of the LEFT eleventh rib. IMPRESSION: No CT etiology for acute abdominal pain identified. Aortic Atherosclerosis (ICD10-I70.0). Electronically Signed   By: Valentino Saxon M.D.   On: 07/08/2021 20:13    Procedures Procedures   Medications Ordered in ED Medications  sodium chloride 0.9 % bolus 1,000 mL (0 mLs Intravenous Stopped 07/08/21 2029)  ondansetron (ZOFRAN) injection 4 mg (4 mg Intravenous Given by Other 07/08/21 1819)  iohexol (OMNIPAQUE) 350 MG/ML injection 80 mL (80 mLs Intravenous Contrast Given 07/08/21 1924)    ED Course  I have reviewed the triage vital signs and the nursing notes.  Pertinent labs & imaging results that were available during my care of the patient were reviewed by me and considered in my medical decision making (see chart for details).    MDM Rules/Calculators/A&P                            80 year old male with a history of hyperlipidemia, TIA, BPH, constipation, GERD, perforated sigmoid colon, hypertension, multinodular goiter and chronic  dysphagia followed by Dr. Wilburn Cornelia ENT, who presents with concern for "feeling sick", describing nausea, abdominal discomfort, inability to eat due to difficulty swallowing.   Given concern for progressive difficulty swallowing, patient feeling nauseous and unwell, concern for possible worsening of his goiter leading to worsening airway narrowing or dysphagia, or possible abscess, CT was completed which showed no acute abnormality, including no acute inflammatory changes or abscess.  CT did show enlarged multinodular goiter which is similar to previous exams and ossification of the stylohyoid ligaments which can be seen with Eagle syndrome.  Given abdominal tenderness on exam, nausea, CT abdomen pelvis was ordered to evaluate for signs of diverticulitis, or other abnormalities and showed no evidence of acute abdominal abnormalities.  Urinalysis shows no sign of urinary tract infection creatinine is stable compared to previous.  CBC is also stable in comparison to previous.  COVID-19 and influenza testing are negative. Reports being recently placed on Librium and duloxetine.  His symptoms are not consistent with an anaphylactic reaction.  It is possible his GI symptoms may be side effects of the medication although he reports these began before medication.  Other consideration would be a viral infection.  He is also appeared to have similar concerns in the past, and consider gastro esophageal reflux disease.  Recommend close follow-up with primary care physician.   Final Clinical Impression(s) / ED Diagnoses Final diagnoses:  Generalized abdominal pain  Dysphagia, unspecified type  Nausea    Rx / DC Orders ED Discharge Orders          Ordered    ondansetron (ZOFRAN ODT) 4 MG disintegrating tablet  Every 8 hours PRN        07/08/21 2215             Gareth Morgan, MD 07/10/21 0107

## 2021-07-13 DIAGNOSIS — H903 Sensorineural hearing loss, bilateral: Secondary | ICD-10-CM | POA: Diagnosis not present

## 2021-07-18 DIAGNOSIS — I10 Essential (primary) hypertension: Secondary | ICD-10-CM | POA: Diagnosis not present

## 2021-07-18 DIAGNOSIS — R1313 Dysphagia, pharyngeal phase: Secondary | ICD-10-CM | POA: Diagnosis not present

## 2021-07-18 DIAGNOSIS — J3489 Other specified disorders of nose and nasal sinuses: Secondary | ICD-10-CM | POA: Diagnosis not present

## 2021-07-18 DIAGNOSIS — F0631 Mood disorder due to known physiological condition with depressive features: Secondary | ICD-10-CM | POA: Diagnosis not present

## 2021-07-26 ENCOUNTER — Emergency Department (HOSPITAL_COMMUNITY)
Admission: EM | Admit: 2021-07-26 | Discharge: 2021-07-27 | Disposition: A | Discharge status: Home / Self Care | Payer: Medicare HMO | Attending: Emergency Medicine | Admitting: Emergency Medicine

## 2021-07-26 ENCOUNTER — Emergency Department (HOSPITAL_COMMUNITY): Payer: Medicare HMO

## 2021-07-26 ENCOUNTER — Other Ambulatory Visit: Payer: Self-pay

## 2021-07-26 DIAGNOSIS — Z87891 Personal history of nicotine dependence: Secondary | ICD-10-CM | POA: Diagnosis not present

## 2021-07-26 DIAGNOSIS — E010 Iodine-deficiency related diffuse (endemic) goiter: Secondary | ICD-10-CM | POA: Diagnosis not present

## 2021-07-26 DIAGNOSIS — I129 Hypertensive chronic kidney disease with stage 1 through stage 4 chronic kidney disease, or unspecified chronic kidney disease: Secondary | ICD-10-CM | POA: Diagnosis not present

## 2021-07-26 DIAGNOSIS — R1310 Dysphagia, unspecified: Secondary | ICD-10-CM | POA: Insufficient documentation

## 2021-07-26 DIAGNOSIS — R4182 Altered mental status, unspecified: Secondary | ICD-10-CM | POA: Insufficient documentation

## 2021-07-26 DIAGNOSIS — R110 Nausea: Secondary | ICD-10-CM | POA: Insufficient documentation

## 2021-07-26 DIAGNOSIS — R410 Disorientation, unspecified: Secondary | ICD-10-CM | POA: Diagnosis not present

## 2021-07-26 DIAGNOSIS — I10 Essential (primary) hypertension: Secondary | ICD-10-CM | POA: Diagnosis not present

## 2021-07-26 DIAGNOSIS — N183 Chronic kidney disease, stage 3 unspecified: Secondary | ICD-10-CM | POA: Insufficient documentation

## 2021-07-26 DIAGNOSIS — R079 Chest pain, unspecified: Secondary | ICD-10-CM | POA: Diagnosis not present

## 2021-07-26 DIAGNOSIS — Z79899 Other long term (current) drug therapy: Secondary | ICD-10-CM | POA: Insufficient documentation

## 2021-07-26 LAB — BASIC METABOLIC PANEL
Anion gap: 8 (ref 5–15)
BUN: 14 mg/dL (ref 8–23)
CO2: 26 mmol/L (ref 22–32)
Calcium: 9.5 mg/dL (ref 8.9–10.3)
Chloride: 107 mmol/L (ref 98–111)
Creatinine, Ser: 1.32 mg/dL — ABNORMAL HIGH (ref 0.61–1.24)
GFR, Estimated: 55 mL/min — ABNORMAL LOW (ref >60)
Glucose, Bld: 119 mg/dL — ABNORMAL HIGH (ref 70–99)
Potassium: 4 mmol/L (ref 3.5–5.1)
Sodium: 141 mmol/L (ref 135–145)

## 2021-07-26 LAB — CBC
HCT: 42.7 % (ref 39.0–52.0)
Hemoglobin: 14.1 g/dL (ref 13.0–17.0)
MCH: 32 pg (ref 26.0–34.0)
MCHC: 33 g/dL (ref 30.0–36.0)
MCV: 96.8 fL (ref 80.0–100.0)
Platelets: 134 10*3/uL — ABNORMAL LOW (ref 150–400)
RBC: 4.41 MIL/uL (ref 4.22–5.81)
RDW: 12.9 % (ref 11.5–15.5)
WBC: 3.4 10*3/uL — ABNORMAL LOW (ref 4.0–10.5)
nRBC: 0 % (ref 0.0–0.2)

## 2021-07-26 LAB — TROPONIN I (HIGH SENSITIVITY)
Troponin I (High Sensitivity): 6 ng/L (ref <18)
Troponin I (High Sensitivity): 7 ng/L (ref <18)

## 2021-07-26 MED ORDER — DEXAMETHASONE 4 MG PO TABS: 8.0000 mg | ORAL_TABLET | Freq: Once | ORAL | 0 refills | Status: AC

## 2021-07-26 MED ORDER — DEXAMETHASONE SODIUM PHOSPHATE 10 MG/ML IJ SOLN
10.0000 mg | Freq: Once | INTRAMUSCULAR | Status: AC
  Administered 2021-07-26: 10 mg via INTRAVENOUS
  Filled 2021-07-26: qty 1

## 2021-07-26 NOTE — ED Provider Notes (Signed)
[facility] EMERGENCY DEPARTMENT Provider Note   CSN: [id number] Arrival date & time: 07/26/21  [id number]     History Chief Complaint  Patient presents with  . Altered Mental Status    Randy Merritt is a 80 y.o. male.  80 year old male with history of goiter, TIA, globus pharyngeus, oropharyngeal dysphagia, brought in by EMS from urgent care clinic today where he checked in for worsening dysphagia and nausea. Patient is noted to be alert and oriented but rambling per EMS. Patient reports loss of appetite, states he ate a few chicken wings last week, today tried to eat some chicken noodle soup and states his "body just didn't want it." Patient is unable to say if he feels like the food gets stuck or if he is unable to swallow it, states he just doesn't have an appetite. Also reports an episode while in the ER today of feeling like his chest was buzzing. Again has a difficult time explaining this, states he feels like a pot with bubbles or a child blowing bubbles, or an internal buzzing or butterfly. No similar episodes previously, noted to be in NSR at the time.       Past Medical History:  Diagnosis Date  . Acid reflux   . Anxiety   . Arthritis   . Depression    " a little"  . Goiter   . High cholesterol   . History of blood transfusion   . Perforated bowel ([place])   . Seasonal allergies     Patient Active Problem List   Diagnosis Date Noted  . Chronic constipation 02/15/2021  . Cervical radiculopathy 01/24/2021  . History of cervical spinal arthrodesis 01/24/2021  . Essential (primary) hypertension 01/16/2021  . Neck pain 01/16/2021  . Aphasia 07/01/2020  . TIA (transient ischemic attack) 06/29/2020  . 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 Goiter 12/24/2019  . Perforated sigmoid colon ([place]) 10/06/2019  . 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/2019  . Anxiety 04/14/2018  . BPH (benign prostatic hyperplasia) 04/14/2018  . CKD (chronic kidney disease) stage 3,  GFR 30-59 ml/min (HCC) 04/14/2018  . History of adenomatous polyp of colon 04/14/2018  . Hyperlipidemia 04/14/2018  . 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  . Ingrown toenail 10/11/2017  . 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 Oropharyngeal dysphagia 02/02/2017  . Chronic rhinitis 02/01/2017  . Gastroesophageal reflux disease 02/01/2017    Past Surgical History:  Procedure Laterality Date  . ANTERIOR CERVICAL DECOMP/DISCECTOMY FUSION N/A 04/08/2013   Procedure: ANTERIOR CERVICAL DECOMPRESSION/DISCECTOMY FUSION 1 LEVEL;  Surgeon: Floyce Stakes, MD;  Location: [facility];  Service: Neurosurgery;  Laterality: N/A;  Cervical five-six Anterior cervical decompression/diskectomy fusion  . APPENDECTOMY N/A 10/06/2019   Procedure: APPENDECTOMY;  Surgeon: Clovis Riley, MD;  Location: [place];  Service: General;  Laterality: N/A;  . BIOPSY  01/30/2019   Procedure: BIOPSY;  Surgeon: Carol Ada, MD;  Location: [facility];  Service: Endoscopy;;  . CERVICAL DISCECTOMY     x2  . CHOLECYSTECTOMY    . COLON RESECTION SIGMOID N/A 10/06/2019   Procedure: COLON RESECTION SIGMOID;  Surgeon: Clovis Riley, MD;  Location: [place];  Service: General;  Laterality: N/A;  . ESOPHAGEAL DILATION  01/30/2019   Procedure: ESOPHAGEAL DILATION;  Surgeon: Carol Ada, MD;  Location: [facility];  Service: Endoscopy;;  [place]  . ESOPHAGOGASTRODUODENOSCOPY (EGD) WITH PROPOFOL N/A 01/30/2019   Procedure: ESOPHAGOGASTRODUODENOSCOPY (EGD) WITH PROPOFOL;  Surgeon: Carol Ada, MD;  Location: [facility];  Service: Endoscopy;  Laterality: N/A;  . gallstone removal    . LAPAROTOMY N/A 10/06/2019   Procedure: EXPLORATORY LAPAROTOMY;  Surgeon: Clovis Riley, MD;  Location: [facility];  Service: General;  Laterality: N/A;       Family History  Problem Relation Age of Onset  . Healthy Mother   . Healthy Father   . Thyroid disease Neg Hx     Social History   Tobacco Use  .  Smoking status: Former  . Smokeless tobacco: Never  Vaping Use  . Vaping Use: Never used  Substance Use Topics  . Alcohol use: No  . Drug use: No    Home Medications Prior to Admission medications   Medication Sig Start Date End Date Taking? Authorizing Provider  dexamethasone (DECADRON) 4 MG tablet Take 2 tablets (8 mg total) by mouth once for 1 dose. 07/26/21 07/26/21 Yes Tacy Learn, PA-C  acetaminophen (TYLENOL) 500 MG tablet Take 2 tablets (1,000 mg total) by mouth every 8 (eight) hours. Patient taking differently: Take 1,000 mg by mouth every 8 (eight) hours as needed. pain 10/15/19   Earnstine Regal, PA-C  ALPRAZolam Duanne Moron) 0.5 MG tablet Take 1 tablet (0.5 mg total) by mouth 2 (two) times daily as needed for anxiety. Patient not taking: Reported on 10/08/2020 07/10/20   Raylene Everts, MD  alum & mag hydroxide-simeth (MAALOX/MYLANTA) 200-200-20 MG/5ML suspension Take 15 mLs by mouth every 6 (six) hours as needed for indigestion or heartburn. Patient not taking: Reported on 10/08/2020 06/19/19   Wieters, Hallie C, PA-C  amLODipine (NORVASC) 5 MG tablet Take 5 mg by mouth daily.    [provider]  aspirin EC 81 MG tablet Take 1 tablet (81 mg total) by mouth daily. Patient not taking: Reported on 10/08/2020 10/15/19   Earnstine Regal, PA-C  chlordiazePOXIDE (LIBRIUM) 25 MG capsule  10/17/20   [provider]  diclofenac Sodium (VOLTAREN) 1 % GEL Apply 2 g topically 4 (four) times daily. 06/27/21   Volney American, PA-C  famotidine (PEPCID) 40 MG tablet 40 mg in the evening Patient not taking: Reported on 10/08/2020 04/05/20   Jiles Prows, MD  fluticasone [facility]) 50 MCG/ACT nasal spray 1 spray each nostril 1 time per day Patient not taking: Reported on 10/08/2020 04/05/20   Jiles Prows, MD  lamoTRIgine (LAMICTAL) 25 MG tablet Take 25 mg by mouth daily. 10/17/20   [provider]  lansoprazole (PREVACID) 30 MG capsule  04/11/21   [provider]  loratadine (CLARITIN) 10 MG tablet 1-2 tablets 1 time per day Patient not taking: Reported on 10/08/2020 04/05/20   Jiles Prows, MD  montelukast (SINGULAIR) 10 MG tablet 1 tablet 1 time per day Patient not taking: Reported on 10/08/2020 04/05/20   Jiles Prows, MD  Multiple Vitamins-Iron (MULTIVITAMINS WITH IRON) TABS tablet You should get a Women's One a day vitamin with iron and take daily.  This will help with your low hemoglobin.  You can buy this at any drug store, and choose whatever brand you like. Patient taking differently: Take 1 tablet by mouth daily.  10/15/19   Earnstine Regal, PA-C  ondansetron (ZOFRAN ODT) 4 MG disintegrating tablet Take 1 tablet (4 mg total) by mouth every 8 (eight) hours as needed for nausea or vomiting. 07/08/21   Gareth Morgan, MD  pantoprazole (PROTONIX) 40 MG tablet Take 1 tablet (40 mg total) by mouth 2 (two) times daily. 04/05/20   Kozlow, Donnamarie Poag,  MD  polyethylene glycol powder (GLYCOLAX/MIRALAX) 17 GM/SCOOP powder SMARTSIG:255 Gram(s) By Mouth Once 10/08/20   [provider]  promethazine (PHENERGAN) 12.5 MG tablet Take 1 tablet (12.5 mg total) by mouth every 6 (six) hours as needed for nausea or vomiting. 05/17/21   Isla Pence, MD  rosuvastatin (CRESTOR) 10 MG tablet Take 10 mg by mouth daily. 03/01/20   [provider]  timolol (TIMOPTIC) 0.5 % ophthalmic solution 1 drop 2 (two) times daily. 11/23/20   [provider]  tiZANidine (ZANAFLEX) 2 MG tablet Take 1 tablet (2 mg total) by mouth every 8 (eight) hours as needed for muscle spasms. Do not drink alcohol or drive while taking this medication.  May cause drowsiness. 06/27/21   Volney American, PA-C    Allergies    Alprazolam, Cyclobenzaprine, and Prednisone  Review of Systems   Review of Systems  Constitutional:  Negative for fever.  HENT:  Positive for trouble swallowing.   Respiratory:  Negative for shortness of breath.   Cardiovascular:   Positive for palpitations. Negative for chest pain.  Gastrointestinal:  Negative for abdominal pain, constipation, diarrhea, nausea and vomiting.  Genitourinary:  Negative for difficulty urinating.  Musculoskeletal:  Negative for arthralgias, gait problem and myalgias.  Skin:  Negative for rash and wound.  Allergic/Immunologic: Negative for immunocompromised state.  Neurological:  Negative for weakness and numbness.  All other systems reviewed and are negative.  Physical Exam Updated Vital Signs BP 125/81 (BP Location: Right Arm)   Pulse 69   Temp 98.2 F (36.8 C) (Oral)   Resp (!) 23   Ht '5\' 8"'$  (1.727 m)   Wt 72.6 kg   SpO2 100%   BMI 24.34 kg/m   Physical Exam Vitals and nursing note reviewed.  Constitutional:      General: He is not in acute distress.    Appearance: He is well-developed. He is not diaphoretic.  HENT:     Head: Normocephalic and atraumatic.     Mouth/Throat:     Mouth: Mucous membranes are moist.     Comments: Tolerating secretions, voice sounds like he is talking through his saliva Eyes:     Pupils: Pupils are equal, round, and reactive to light.  Neck:     Thyroid: Thyromegaly present.  Cardiovascular:     Rate and Rhythm: Normal rate and regular rhythm.     Pulses: Normal pulses.     Heart sounds: Normal heart sounds.  Pulmonary:     Effort: Pulmonary effort is normal.     Breath sounds: Normal breath sounds.  Abdominal:     General: There is no distension.     Palpations: Abdomen is soft.     Tenderness: There is abdominal tenderness.  Musculoskeletal:     Right lower leg: No edema.     Left lower leg: No edema.  Skin:    General: Skin is warm and dry.     Findings: No erythema or rash.  Neurological:     Mental Status: He is alert and oriented to person, place, and time.     Sensory: No sensory deficit.     Motor: No weakness.  Psychiatric:        Behavior: Behavior normal.    ED Results / Procedures / Treatments   Labs (all labs  ordered are listed, but only abnormal results are displayed) Labs Reviewed  BASIC METABOLIC PANEL - Abnormal; Notable for the following components:      Result Value  Glucose, Bld 119 (*)    Creatinine, Ser 1.32 (*)    GFR, Estimated 55 (*)    All other components within normal limits  CBC - Abnormal; Notable for the following components:   WBC 3.4 (*)    Platelets 134 (*)    All other components within normal limits  TROPONIN I (HIGH SENSITIVITY)  TROPONIN I (HIGH SENSITIVITY)    EKG EKG Interpretation  Date/Time:  Wednesday July 26 2021 09:31:14 EDT Ventricular Rate:  71 PR Interval:  153 QRS Duration: 80 QT Interval:  374 QTC Calculation: 407 R Axis:   73 Text Interpretation: Sinus rhythm Probable left atrial enlargement RSR' in V1 or V2, probably normal variant No significant change since last tracing Confirmed by Isla Pence [phone]) on 07/26/2021 9:39:54 AM  Radiology DG Chest Port 1 View  Result Date: 07/26/2021 CLINICAL DATA:  Chest pain. EXAM: PORTABLE CHEST 1 VIEW COMPARISON:  July 01, 2021. FINDINGS: The heart size and mediastinal contours are within normal limits. Both lungs are clear. The visualized skeletal structures are unremarkable. IMPRESSION: No active disease. Electronically Signed   By: Marijo Conception M.D.   On: 07/26/2021 10:07    Procedures Procedures   Medications Ordered in ED Medications  dexamethasone (DECADRON) injection 10 mg (10 mg Intravenous Given 07/26/21 1447)    ED Course  I have reviewed the triage vital signs and the nursing notes.  Pertinent labs & imaging results that were available during my care of the patient were reviewed by me and considered in my medical decision making (see chart for details).  Clinical Course as of 07/26/21 1500  Wed Jul 26, 4224  [id number] 80 year old male with complaint of dysphagia, progressively worsening, seen by ENT without cause found, not scheduled to see his ENT again until November.  Able  to pass swallow study, no focal deficits. History reviewed with wife, [name].  Work up today is not revealing. Recent CT soft tissue neck reviewed, no acute cause.  Vitals stable, room air O2 sat 100%.  CBC and BMP without changes from baseline, mildly elevated Cr at 1.32. Trop flat. (Ordered due to complaint of chest fluttering on arrival), EKG NSR. Will try a dose of decadron today, repeat in 24 hours. Follow up with GI, referral given. Return to ER for new or worsening symptoms.  [LM]    Clinical Course User Index [LM] Roque Lias   MDM Rules/Calculators/A&P                           Final Clinical Impression(s) / ED Diagnoses Final diagnoses:  Dysphagia, unspecified type    Rx / DC Orders ED Discharge Orders          Ordered    dexamethasone (DECADRON) 4 MG tablet   Once        07/26/21 1415             Tacy Learn, PA-C 07/26/21 1500    Isla Pence, MD 07/26/21 [phone]

## 2021-07-26 NOTE — ED Notes (Signed)
Got patient on te monitor did ekg shown to Dr Gilford Raid patient is resting with call bell in reach got patient a warm blanket

## 2021-07-26 NOTE — ED Triage Notes (Signed)
Pt arrived to ED via EMS from [facility] w/ c/o AMS. Pt initially presented to UC for dysphagia and nausea that has worsened over the past couple days. Pt oriented x 4, but confused and rambling per EMS. Pt has hx of TIA in 2021. VSS w/ EMS. LKW unknown.

## 2021-07-26 NOTE — Discharge Instructions (Addendum)
Follow up with GI. If you have a gastroenterologist you have seen before, call them to make an appointment. If not, a referral has been provided.  You were given a dose of decadron today in the ER to help with the swelling. A prescription was sent to your pharmacy to repeat this dose once in 24 hours (tomorrow at [facility]).

## 2021-07-26 NOTE — ED Notes (Signed)
Reviewed discharge instructions with patient. Follow-up care and medications reviewed. Patient  verbalized understanding. Patient A&Ox4, VSS, and ambulatory with steady gait upon discharge.  °

## 2021-07-27 ENCOUNTER — Encounter: Payer: Self-pay | Admitting: Internal Medicine

## 2021-07-31 ENCOUNTER — Telehealth: Payer: Self-pay

## 2021-07-31 NOTE — Telephone Encounter (Signed)
Pt was declined services by Dr. Silverio Decamp on April 16th 2019.

## 2021-08-02 NOTE — Telephone Encounter (Signed)
Randy Merritt was notified via phone that services was declined in 02/2018.

## 2021-08-03 ENCOUNTER — Emergency Department (HOSPITAL_COMMUNITY)
Admission: EM | Admit: 2021-08-03 | Discharge: 2021-08-03 | Disposition: A | Discharge status: Home / Self Care | Payer: Medicare HMO | Attending: Emergency Medicine | Admitting: Emergency Medicine

## 2021-08-03 ENCOUNTER — Other Ambulatory Visit: Payer: Self-pay

## 2021-08-03 ENCOUNTER — Encounter (HOSPITAL_COMMUNITY): Payer: Self-pay

## 2021-08-03 ENCOUNTER — Emergency Department (HOSPITAL_COMMUNITY): Payer: Medicare HMO

## 2021-08-03 DIAGNOSIS — I129 Hypertensive chronic kidney disease with stage 1 through stage 4 chronic kidney disease, or unspecified chronic kidney disease: Secondary | ICD-10-CM | POA: Insufficient documentation

## 2021-08-03 DIAGNOSIS — R0789 Other chest pain: Secondary | ICD-10-CM | POA: Diagnosis not present

## 2021-08-03 DIAGNOSIS — Z79899 Other long term (current) drug therapy: Secondary | ICD-10-CM | POA: Diagnosis not present

## 2021-08-03 DIAGNOSIS — R0602 Shortness of breath: Secondary | ICD-10-CM | POA: Insufficient documentation

## 2021-08-03 DIAGNOSIS — R112 Nausea with vomiting, unspecified: Secondary | ICD-10-CM | POA: Insufficient documentation

## 2021-08-03 DIAGNOSIS — R079 Chest pain, unspecified: Secondary | ICD-10-CM | POA: Insufficient documentation

## 2021-08-03 DIAGNOSIS — Z87891 Personal history of nicotine dependence: Secondary | ICD-10-CM | POA: Insufficient documentation

## 2021-08-03 DIAGNOSIS — Z20822 Contact with and (suspected) exposure to covid-19: Secondary | ICD-10-CM | POA: Insufficient documentation

## 2021-08-03 DIAGNOSIS — R202 Paresthesia of skin: Secondary | ICD-10-CM | POA: Insufficient documentation

## 2021-08-03 DIAGNOSIS — N183 Chronic kidney disease, stage 3 unspecified: Secondary | ICD-10-CM | POA: Diagnosis not present

## 2021-08-03 LAB — COMPREHENSIVE METABOLIC PANEL
ALT: 76 U/L — ABNORMAL HIGH (ref 0–44)
AST: 40 U/L (ref 15–41)
Albumin: 4.5 g/dL (ref 3.5–5.0)
Alkaline Phosphatase: 99 U/L (ref 38–126)
Anion gap: 6 (ref 5–15)
BUN: 14 mg/dL (ref 8–23)
CO2: 28 mmol/L (ref 22–32)
Calcium: 9.7 mg/dL (ref 8.9–10.3)
Chloride: 109 mmol/L (ref 98–111)
Creatinine, Ser: 1.45 mg/dL — ABNORMAL HIGH (ref 0.61–1.24)
GFR, Estimated: 49 mL/min — ABNORMAL LOW (ref >60)
Glucose, Bld: 112 mg/dL — ABNORMAL HIGH (ref 70–99)
Potassium: 4 mmol/L (ref 3.5–5.1)
Sodium: 143 mmol/L (ref 135–145)
Total Bilirubin: 1.2 mg/dL (ref 0.3–1.2)
Total Protein: 7.7 g/dL (ref 6.5–8.1)

## 2021-08-03 LAB — CBC WITH DIFFERENTIAL/PLATELET
Abs Immature Granulocytes: 0.01 10*3/uL (ref 0.00–0.07)
Basophils Absolute: 0 10*3/uL (ref 0.0–0.1)
Basophils Relative: 0 %
Eosinophils Absolute: 0.1 10*3/uL (ref 0.0–0.5)
Eosinophils Relative: 3 %
HCT: 44.6 % (ref 39.0–52.0)
Hemoglobin: 14.8 g/dL (ref 13.0–17.0)
Immature Granulocytes: 0 %
Lymphocytes Relative: 42 %
Lymphs Abs: 1.1 10*3/uL (ref 0.7–4.0)
MCH: 32.1 pg (ref 26.0–34.0)
MCHC: 33.2 g/dL (ref 30.0–36.0)
MCV: 96.7 fL (ref 80.0–100.0)
Monocytes Absolute: 0.3 10*3/uL (ref 0.1–1.0)
Monocytes Relative: 12 %
Neutro Abs: 1.2 10*3/uL — ABNORMAL LOW (ref 1.7–7.7)
Neutrophils Relative %: 43 %
Platelets: 165 10*3/uL (ref 150–400)
RBC: 4.61 MIL/uL (ref 4.22–5.81)
RDW: 13.1 % (ref 11.5–15.5)
WBC: 2.7 10*3/uL — ABNORMAL LOW (ref 4.0–10.5)
nRBC: 0 % (ref 0.0–0.2)

## 2021-08-03 LAB — RESP PANEL BY RT-PCR (FLU A&B, COVID) ARPGX2
Influenza A by PCR: NEGATIVE
Influenza B by PCR: NEGATIVE
SARS Coronavirus 2 by RT PCR: NEGATIVE

## 2021-08-03 LAB — TROPONIN I (HIGH SENSITIVITY): Troponin I (High Sensitivity): 7 ng/L (ref <18)

## 2021-08-03 LAB — BRAIN NATRIURETIC PEPTIDE: B Natriuretic Peptide: 20.8 pg/mL (ref 0.0–100.0)

## 2021-08-03 MED ORDER — HYDROXYZINE HCL 25 MG PO TABS
25.0000 mg | ORAL_TABLET | Freq: Four times a day (QID) | ORAL | 0 refills | Status: DC | PRN
Start: 2021-08-03 — End: 2022-03-12

## 2021-08-03 MED ORDER — PANTOPRAZOLE SODIUM 20 MG PO TBEC: 40.0000 mg | DELAYED_RELEASE_TABLET | Freq: Every day | ORAL | 0 refills | Status: DC

## 2021-08-03 NOTE — ED Triage Notes (Signed)
Pt complains of shortness of breath and left arm tingling since last night.

## 2021-08-03 NOTE — ED Notes (Signed)
Pt ambulatory without assistance.  

## 2021-08-03 NOTE — ED Provider Notes (Signed)
[facility] DEPT Provider Note   CSN: [id number] Arrival date & time: 08/03/21  4098     History Chief Complaint  Patient presents with   Shortness of Breath   arm tingling    Randy Merritt is a 80 y.o. male.  HPI     80yo male with history of hypertension, aphasia, hyperlipidemia, goiter, dysphagia, anxiety, presents with concern for chest pain.  Reports episode starting last night when he was laying in bed.  Felt like a tightness and burning and some tingling in left arm.  No change in nausea or vomiting he has had. Did feel short of breath, not specifically having orthopnea.  Is concerned regarding neck symptoms and finding an ENT for his continuing dysphagia. Is coughing up white sputum from his throat.  Feels his anxiety is getting to him.  He used to be on medications that helped but is not anymore.  He was recently seen in the ED and trial of steroids given but he did not feel any relief with this.  Also reports he is taking famotidine but he continues to have burning in his chest and does not feel that it helps.  He denies the discomfort in his chest being related to exertion, or eating.  Reports that it comes and goes.  Dr. Terrence Dupont is his cardiologist.  He feels that the symptoms are related to his reflux.  Past Medical History:  Diagnosis Date   Acid reflux    Anxiety    Arthritis    Depression    " a little"   Goiter    High cholesterol    History of blood transfusion    Perforated bowel (HCC)    Seasonal allergies     Patient Active Problem List   Diagnosis Date Noted   Chronic constipation 02/15/2021   Cervical radiculopathy 01/24/2021   History of cervical spinal arthrodesis 01/24/2021   Essential (primary) hypertension 01/16/2021   Neck pain 01/16/2021   Aphasia 07/01/2020   TIA (transient ischemic attack) 06/29/2020   Globus pharyngeus 05/20/2020   Goiter 12/24/2019   Perforated sigmoid colon ([place]) 10/06/2019    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/2019   Anxiety 04/14/2018   BPH (benign prostatic hyperplasia) 04/14/2018   CKD (chronic kidney disease) stage 3, GFR 30-59 ml/min ([place]) 04/14/2018   History of adenomatous polyp of colon 04/14/2018   Hyperlipidemia 04/14/2018   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   Ingrown toenail 10/11/2017   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/91/4782   Oropharyngeal dysphagia 02/02/2017   Chronic rhinitis 02/01/2017   Gastroesophageal reflux disease 02/01/2017    Past Surgical History:  Procedure Laterality Date   ANTERIOR CERVICAL DECOMP/DISCECTOMY FUSION N/A 04/08/2013   Procedure: ANTERIOR CERVICAL DECOMPRESSION/DISCECTOMY FUSION 1 LEVEL;  Surgeon: Floyce Stakes, MD;  Location: [facility];  Service: Neurosurgery;  Laterality: N/A;  Cervical five-six Anterior cervical decompression/diskectomy fusion   APPENDECTOMY N/A 10/06/2019   Procedure: APPENDECTOMY;  Surgeon: Clovis Riley, MD;  Location: [facility];  Service: General;  Laterality: N/A;   BIOPSY  01/30/2019   Procedure: BIOPSY;  Surgeon: Carol Ada, MD;  Location: [facility];  Service: Endoscopy;;   CERVICAL DISCECTOMY     x2   CHOLECYSTECTOMY     COLON RESECTION SIGMOID N/A 10/06/2019   Procedure: COLON RESECTION SIGMOID;  Surgeon: Clovis Riley, MD;  Location: [facility];  Service: General;  Laterality: N/A;   ESOPHAGEAL DILATION  01/30/2019   Procedure: ESOPHAGEAL DILATION;  Surgeon:  Carol Ada, MD;  Location: [facility];  Service: Endoscopy;;  [place]   ESOPHAGOGASTRODUODENOSCOPY (EGD) WITH PROPOFOL N/A 01/30/2019   Procedure: ESOPHAGOGASTRODUODENOSCOPY (EGD) WITH PROPOFOL;  Surgeon: Carol Ada, MD;  Location: [facility];  Service: Endoscopy;  Laterality: N/A;   gallstone removal     LAPAROTOMY N/A 10/06/2019   Procedure: EXPLORATORY LAPAROTOMY;  Surgeon: Clovis Riley, MD;  Location: [facility];  Service: General;  Laterality: N/A;       Family History   Problem Relation Age of Onset   Healthy Mother    Healthy Father    Thyroid disease Neg Hx     Social History   Tobacco Use   Smoking status: Former   Smokeless tobacco: Never  Scientific laboratory technician Use: Never used  Substance Use Topics   Alcohol use: No   Drug use: No    Home Medications Prior to Admission medications   Medication Sig Start Date End Date Taking? Authorizing Provider  chlordiazePOXIDE (LIBRIUM) 10 MG capsule Take 10 mg by mouth 2 (two) times daily as needed for anxiety.   Yes [provider]  hydrOXYzine (ATARAX/VISTARIL) 25 MG tablet Take 1 tablet (25 mg total) by mouth every 6 (six) hours as needed for anxiety. 08/03/21  Yes Gareth Morgan, MD  montelukast (SINGULAIR) 10 MG tablet Take 10 mg by mouth at bedtime.   Yes [provider]  pantoprazole (PROTONIX) 20 MG tablet Take 2 tablets (40 mg total) by mouth daily for 14 days. 08/03/21 08/17/21 Yes Gareth Morgan, MD  acetaminophen (TYLENOL) 500 MG tablet Take 2 tablets (1,000 mg total) by mouth every 8 (eight) hours. Patient taking differently: Take 1,000 mg by mouth every 8 (eight) hours as needed. pain 10/15/19   Earnstine Regal, PA-C  ALPRAZolam Duanne Moron) 0.25 MG tablet Take 0.125 mg by mouth 2 (two) times daily as needed for anxiety. 06/19/21   [provider]  amLODipine (NORVASC) 5 MG tablet Take 5 mg by mouth daily.    [provider]  diclofenac Sodium (VOLTAREN) 1 % GEL Apply 2 g topically 4 (four) times daily. Patient taking differently: Apply 2 g topically 4 (four) times daily as needed (joint pain). 06/27/21   Volney American, PA-C  DULoxetine (CYMBALTA) 30 MG capsule Take 30 mg by mouth daily. 07/07/21   [provider]  lamoTRIgine (LAMICTAL) 25 MG tablet Take 25 mg by mouth daily. 10/17/20   [provider]  lansoprazole (PREVACID) 30 MG capsule Take 30 mg by mouth daily. 04/11/21   [provider]  loratadine (CLARITIN) 10 MG tablet  1-2 tablets 1 time per day Patient not taking: Reported on 10/08/2020 04/05/20   Jiles Prows, MD  losartan (COZAAR) 25 MG tablet Take 25 mg by mouth daily. 05/26/21   [provider]  metoprolol succinate (TOPROL-XL) 25 MG 24 hr tablet Take 25 mg by mouth daily. 07/20/21   [provider]  mirtazapine (REMERON) 15 MG tablet Take 15 mg by mouth at bedtime. 07/18/21   [provider]  Multiple Vitamins-Iron (MULTIVITAMINS WITH IRON) TABS tablet You should get a Women's One a day vitamin with iron and take daily.  This will help with your low hemoglobin.  You can buy this at any drug store, and choose whatever brand you like. Patient taking differently: Take 1 tablet by mouth daily.  10/15/19   Earnstine Regal, PA-C  ondansetron (ZOFRAN ODT) 4 MG disintegrating tablet Take 1 tablet (4 mg total) by mouth every 8 (  eight) hours as needed for nausea or vomiting. 07/08/21   Gareth Morgan, MD  promethazine (PHENERGAN) 12.5 MG tablet Take 1 tablet (12.5 mg total) by mouth every 6 (six) hours as needed for nausea or vomiting. 05/17/21   Isla Pence, MD  rosuvastatin (CRESTOR) 5 MG tablet Take 5 mg by mouth daily. 05/26/21   [provider]  sucralfate (CARAFATE) 1 g tablet Take 1 g by mouth 2 (two) times daily. 07/06/21   [provider]  timolol (TIMOPTIC) 0.5 % ophthalmic solution Place 1 drop into both eyes 2 (two) times daily. 11/23/20   [provider]  tiZANidine (ZANAFLEX) 2 MG tablet Take 1 tablet (2 mg total) by mouth every 8 (eight) hours as needed for muscle spasms. Do not drink alcohol or drive while taking this medication.  May cause drowsiness. Patient taking differently: Take 2 mg by mouth every 8 (eight) hours as needed for muscle spasms. 06/27/21   Volney American, PA-C    Allergies    Alprazolam, Cyclobenzaprine, and Prednisone  Review of Systems   Review of Systems  Constitutional:  Positive for fatigue. Negative for fever.   HENT:  Positive for trouble swallowing. Negative for sore throat.   Eyes:  Negative for visual disturbance.  Respiratory:  Positive for shortness of breath. Negative for cough.   Cardiovascular:  Positive for chest pain.  Gastrointestinal:  Positive for nausea and vomiting. Negative for abdominal pain and diarrhea.  Genitourinary:  Negative for difficulty urinating.  Musculoskeletal:  Negative for back pain and neck stiffness.  Skin:  Negative for rash.  Neurological:  Negative for syncope, weakness (arm jumps for a second here and there, not weak), numbness (reports had tingling) and headaches. Speech difficulty: no change.  Physical Exam Updated Vital Signs BP (!) 144/107   Pulse (!) 57   Temp 98 F (36.7 C) (Oral)   Resp (!) 23   Ht 5\' 8"  (1.727 m)   Wt 72.6 kg   SpO2 100%   BMI 24.33 kg/m   Physical Exam Constitutional:      General: He is not in acute distress.    Appearance: Normal appearance. He is not ill-appearing.  HENT:     Head: Normocephalic and atraumatic.  Eyes:     General: No visual field deficit.    Extraocular Movements: Extraocular movements intact.     Conjunctiva/sclera: Conjunctivae normal.     Pupils: Pupils are equal, round, and reactive to light.  Cardiovascular:     Rate and Rhythm: Normal rate and regular rhythm.     Pulses: Normal pulses.  Pulmonary:     Effort: Pulmonary effort is normal. No respiratory distress.  Musculoskeletal:        General: No swelling or tenderness.     Cervical back: Normal range of motion.  Skin:    General: Skin is warm and dry.     Findings: No erythema or rash.  Neurological:     General: No focal deficit present.     Mental Status: He is alert and oriented to person, place, and time.     GCS: GCS eye subscore is 4. GCS verbal subscore is 5. GCS motor subscore is 6.     Cranial Nerves: No cranial nerve deficit, dysarthria (chronic aphasia) or facial asymmetry.     Sensory: No sensory deficit.     Motor: No  weakness or tremor (mild left sided, reports has had that).     Coordination: Coordination normal. Finger-Nose-Finger Test normal.  Gait: Gait normal.    ED Results / Procedures / Treatments   Labs (all labs ordered are listed, but only abnormal results are displayed) Labs Reviewed  CBC WITH DIFFERENTIAL/PLATELET - Abnormal; Notable for the following components:      Result Value   WBC 2.7 (*)    Neutro Abs 1.2 (*)    All other components within normal limits  COMPREHENSIVE METABOLIC PANEL - Abnormal; Notable for the following components:   Glucose, Bld 112 (*)    Creatinine, Ser 1.45 (*)    ALT 76 (*)    GFR, Estimated 49 (*)    All other components within normal limits  RESP PANEL BY RT-PCR (FLU A&B, COVID) ARPGX2  BRAIN NATRIURETIC PEPTIDE  URINALYSIS, ROUTINE W REFLEX MICROSCOPIC  TROPONIN I (HIGH SENSITIVITY)  TROPONIN I (HIGH SENSITIVITY)    EKG EKG Interpretation  Date/Time:  Thursday August 03 2021 06:51:12 EDT Ventricular Rate:  78 PR Interval:  146 QRS Duration: 76 QT Interval:  360 QTC Calculation: 410 R Axis:   77 Text Interpretation: Sinus rhythm Right atrial enlargement No significant change was found Confirmed by Ezequiel Essex [phone]) on 08/03/2021 7:20:03 AM  Radiology DG Chest Portable 1 View  Result Date: 08/03/2021 CLINICAL DATA:  Shortness of breath, left arm tingling EXAM: PORTABLE CHEST 1 VIEW COMPARISON:  Chest radiograph 07/26/2021 FINDINGS: The cardiomediastinal silhouette is stable. There is no focal consolidation or pulmonary edema. There is no pleural effusion or pneumothorax. There is no acute osseous abnormality. Cervical spine fusion hardware is partially imaged. There are degenerative changes at the right [facility] joint. IMPRESSION: No radiographic evidence of acute cardiopulmonary process. Electronically Signed   By: Valetta Mole M.D.   On: 08/03/2021 08:20    Procedures Procedures   Medications Ordered in ED Medications - No data to  display  ED Course  I have reviewed the triage vital signs and the nursing notes.  Pertinent labs & imaging results that were available during my care of the patient were reviewed by me and considered in my medical decision making (see chart for details).    MDM Rules/Calculators/A&P                            80yo male with history of hypertension, aphasia, hyperlipidemia, goiter, dysphagia, anxiety, presents with concern for chest pain.  Differential diagnosis for chest pain includes pulmonary embolus, dissection, pneumothorax, pneumonia, ACS, myocarditis, pericarditis.  EKG was done and evaluate by me and showed no acute ST changes and no signs of pericarditis. Chest x-ray was done and evaluated by me and radiology and showed no sign of pneumonia or pneumothorax. SYmptoms/exam not consistent with food impaction. Have low suspicion for PE given description of symptoms, noted to have some chronic dyspnea for which he had prior CT neck imaging, ddimer age adjusted negative. Do not feel history or exam are consistent with aortic dissection.  He is not having symptoms at this time. Troponin negative after hours of symptoms and not consistent with ACS.  Discussed possibility of observation admission given risk factors, however in setting of other symptoms such as ongoing dysphagia, no continuing chest pain, feel outpatient follow up with Dr. Terrence Dupont is not unreasonable with strict return precautions.  Given rx for protonix 40mg  BID for suspected reflux as well as hydroxyzine as he states he feels a significant amount of anxiety.  Recommend PCP, Cardiology, ENT follow up. Patient discharged in stable condition with understanding of  reasons to return.    Final Clinical Impression(s) / ED Diagnoses Final diagnoses:  Chest pain, unspecified type  Shortness of breath    Rx / DC Orders ED Discharge Orders          Ordered    pantoprazole (PROTONIX) 20 MG tablet  Daily        08/03/21 1025     hydrOXYzine (ATARAX/VISTARIL) 25 MG tablet  Every 6 hours PRN        08/03/21 1025             Gareth Morgan, MD 08/03/21 1057

## 2021-08-07 ENCOUNTER — Ambulatory Visit: Payer: Medicare HMO | Admitting: Podiatry

## 2021-08-09 DIAGNOSIS — R1310 Dysphagia, unspecified: Secondary | ICD-10-CM | POA: Diagnosis not present

## 2021-08-10 ENCOUNTER — Emergency Department (HOSPITAL_COMMUNITY)
Admission: EM | Admit: 2021-08-10 | Discharge: 2021-08-10 | Disposition: A | Discharge status: Home / Self Care | Payer: Medicare HMO | Attending: Emergency Medicine | Admitting: Emergency Medicine

## 2021-08-10 ENCOUNTER — Encounter (HOSPITAL_COMMUNITY): Payer: Self-pay

## 2021-08-10 ENCOUNTER — Emergency Department (HOSPITAL_COMMUNITY): Payer: Medicare HMO

## 2021-08-10 ENCOUNTER — Other Ambulatory Visit: Payer: Self-pay

## 2021-08-10 DIAGNOSIS — Z87891 Personal history of nicotine dependence: Secondary | ICD-10-CM | POA: Insufficient documentation

## 2021-08-10 DIAGNOSIS — R059 Cough, unspecified: Secondary | ICD-10-CM | POA: Insufficient documentation

## 2021-08-10 DIAGNOSIS — Z79899 Other long term (current) drug therapy: Secondary | ICD-10-CM | POA: Insufficient documentation

## 2021-08-10 DIAGNOSIS — R1310 Dysphagia, unspecified: Secondary | ICD-10-CM | POA: Diagnosis present

## 2021-08-10 DIAGNOSIS — Z8673 Personal history of transient ischemic attack (TIA), and cerebral infarction without residual deficits: Secondary | ICD-10-CM | POA: Insufficient documentation

## 2021-08-10 DIAGNOSIS — N183 Chronic kidney disease, stage 3 unspecified: Secondary | ICD-10-CM | POA: Insufficient documentation

## 2021-08-10 DIAGNOSIS — I129 Hypertensive chronic kidney disease with stage 1 through stage 4 chronic kidney disease, or unspecified chronic kidney disease: Secondary | ICD-10-CM | POA: Diagnosis not present

## 2021-08-10 DIAGNOSIS — J398 Other specified diseases of upper respiratory tract: Secondary | ICD-10-CM | POA: Diagnosis not present

## 2021-08-10 DIAGNOSIS — Z981 Arthrodesis status: Secondary | ICD-10-CM | POA: Diagnosis not present

## 2021-08-10 DIAGNOSIS — K219 Gastro-esophageal reflux disease without esophagitis: Secondary | ICD-10-CM | POA: Insufficient documentation

## 2021-08-10 DIAGNOSIS — I10 Essential (primary) hypertension: Secondary | ICD-10-CM | POA: Diagnosis not present

## 2021-08-10 LAB — COMPREHENSIVE METABOLIC PANEL
ALT: 57 U/L — ABNORMAL HIGH (ref 0–44)
AST: 38 U/L (ref 15–41)
Albumin: 4.6 g/dL (ref 3.5–5.0)
Alkaline Phosphatase: 104 U/L (ref 38–126)
Anion gap: 9 (ref 5–15)
BUN: 14 mg/dL (ref 8–23)
CO2: 28 mmol/L (ref 22–32)
Calcium: 9.5 mg/dL (ref 8.9–10.3)
Chloride: 105 mmol/L (ref 98–111)
Creatinine, Ser: 1.46 mg/dL — ABNORMAL HIGH (ref 0.61–1.24)
GFR, Estimated: 48 mL/min — ABNORMAL LOW (ref >60)
Glucose, Bld: 107 mg/dL — ABNORMAL HIGH (ref 70–99)
Potassium: 4.3 mmol/L (ref 3.5–5.1)
Sodium: 142 mmol/L (ref 135–145)
Total Bilirubin: 1 mg/dL (ref 0.3–1.2)
Total Protein: 7.6 g/dL (ref 6.5–8.1)

## 2021-08-10 LAB — CBC WITH DIFFERENTIAL/PLATELET
Abs Immature Granulocytes: 0 10*3/uL (ref 0.00–0.07)
Basophils Absolute: 0 10*3/uL (ref 0.0–0.1)
Basophils Relative: 0 %
Eosinophils Absolute: 0.1 10*3/uL (ref 0.0–0.5)
Eosinophils Relative: 2 %
HCT: 42.6 % (ref 39.0–52.0)
Hemoglobin: 14 g/dL (ref 13.0–17.0)
Immature Granulocytes: 0 %
Lymphocytes Relative: 47 %
Lymphs Abs: 1.8 10*3/uL (ref 0.7–4.0)
MCH: 31.3 pg (ref 26.0–34.0)
MCHC: 32.9 g/dL (ref 30.0–36.0)
MCV: 95.3 fL (ref 80.0–100.0)
Monocytes Absolute: 0.3 10*3/uL (ref 0.1–1.0)
Monocytes Relative: 7 %
Neutro Abs: 1.7 10*3/uL (ref 1.7–7.7)
Neutrophils Relative %: 44 %
Platelets: 167 10*3/uL (ref 150–400)
RBC: 4.47 MIL/uL (ref 4.22–5.81)
RDW: 12.9 % (ref 11.5–15.5)
WBC: 3.8 10*3/uL — ABNORMAL LOW (ref 4.0–10.5)
nRBC: 0 % (ref 0.0–0.2)

## 2021-08-10 LAB — TROPONIN I (HIGH SENSITIVITY): Troponin I (High Sensitivity): 8 ng/L (ref <18)

## 2021-08-10 LAB — LIPASE, BLOOD: Lipase: 24 U/L (ref 11–51)

## 2021-08-10 MED ORDER — ALUM & MAG HYDROXIDE-SIMETH 200-200-20 MG/5ML PO SUSP
30.0000 mL | Freq: Once | ORAL | Status: AC
  Administered 2021-08-10: 30 mL via ORAL
  Filled 2021-08-10: qty 30

## 2021-08-10 MED ORDER — LIDOCAINE VISCOUS HCL 2 % MT SOLN
15.0000 mL | Freq: Once | OROMUCOSAL | Status: AC
  Administered 2021-08-10: 15 mL via ORAL
  Filled 2021-08-10: qty 15

## 2021-08-10 MED ORDER — LIDOCAINE VISCOUS HCL 2 % MT SOLN: 15.0000 mL | Freq: Two times a day (BID) | OROMUCOSAL | 0 refills | Status: AC

## 2021-08-10 MED ORDER — SUCRALFATE 1 GM/10ML PO SUSP: 1.0000 g | Freq: Three times a day (TID) | ORAL | 0 refills | Status: DC

## 2021-08-10 MED ORDER — MAALOX MAX 400-400-40 MG/5ML PO SUSP: 5.0000 mL | Freq: Four times a day (QID) | ORAL | 0 refills | Status: DC | PRN

## 2021-08-10 NOTE — ED Provider Notes (Signed)
[place] DEPT Provider Note   CSN: [id number] Arrival date & time: 08/10/21  1738     History Chief Complaint  Patient presents with   Gastroesophageal Reflux   Constipation    Randy Merritt is a 80 y.o. male with past medical history significant for chronic constipation, reflux, anxiety, depression, chronic aphasia who presents for evaluation of reflux.  Patient states he has had a burning sensation which goes from his abdomen up into his chest and throat for months.  He is currently being followed by [facility].  States he is also had chronic difficulty swallowing, initially this was just solid food however is now having to do applesauce, puddings and liquids for his oral intake.  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  Patient was seen by GI yesterday, they have scheduled for EGD on 10/13.  Patient states his symptoms are similar to what they have been for months.  Have not changed.  He denies any abdominal pain.  Family in room states he only intermittently takes his PPI.  He denies any exertional or pleuritic chest pain.  Last bowel movement was 2 days ago.  Described this is hard in nature.  He is passing flatus.  No melena or blood per rectum.  He denies fever, chills, nausea, vomiting, back pain, abdominal pain, difficulty urinating.  States he occasionally will have a cough productive of white sputum.  He rates his current pain a 0/10.  He was given GI cocktail in triage which he states significantly helped a "burning."  He has no current complaints.  Denies additional aggravating or relieving factors.  History obtained from patient, family room and past medical records.  No interpreter used.  HPI     Past Medical History:  Diagnosis Date   Acid reflux    Anxiety    Arthritis    Depression    " a little"   Goiter    High cholesterol    History of blood transfusion    Perforated bowel (HCC)    Seasonal allergies     Patient  Active Problem List   Diagnosis Date Noted   Chronic constipation 02/15/2021   Cervical radiculopathy 01/24/2021   History of cervical spinal arthrodesis 01/24/2021   Essential (primary) hypertension 01/16/2021   Neck pain 01/16/2021   Aphasia 07/01/2020   TIA (transient ischemic attack) 06/29/2020   Globus pharyngeus 05/20/2020   Goiter 12/24/2019   Perforated sigmoid colon ([place]) 10/06/2019   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/2019   Anxiety 04/14/2018   BPH (benign prostatic hyperplasia) 04/14/2018   CKD (chronic kidney disease) stage 3, GFR 30-59 ml/min ([place]) 04/14/2018   History of adenomatous polyp of colon 04/14/2018   Hyperlipidemia 04/14/2018   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   Ingrown toenail 10/11/2017   Chondrodermatitis nodularis helicis of right ear 29/56/2130   Oropharyngeal dysphagia 02/02/2017   Chronic rhinitis 02/01/2017   Gastroesophageal reflux disease 02/01/2017    Past Surgical History:  Procedure Laterality Date   ANTERIOR CERVICAL DECOMP/DISCECTOMY FUSION N/A 04/08/2013   Procedure: ANTERIOR CERVICAL DECOMPRESSION/DISCECTOMY FUSION 1 LEVEL;  Surgeon: Floyce Stakes, MD;  Location: [facility];  Service: Neurosurgery;  Laterality: N/A;  Cervical five-six Anterior cervical decompression/diskectomy fusion   APPENDECTOMY N/A 10/06/2019   Procedure: APPENDECTOMY;  Surgeon: Clovis Riley, MD;  Location: [place];  Service: General;  Laterality: N/A;   BIOPSY  01/30/2019   Procedure: BIOPSY;  Surgeon: Carol Ada, MD;  Location: [facility];  Service: Endoscopy;;   CERVICAL DISCECTOMY     x2   CHOLECYSTECTOMY     COLON RESECTION SIGMOID N/A 10/06/2019   Procedure: COLON RESECTION SIGMOID;  Surgeon: Clovis Riley, MD;  Location: [facility];  Service: General;  Laterality: N/A;   ESOPHAGEAL DILATION  01/30/2019   Procedure: ESOPHAGEAL DILATION;  Surgeon: Carol Ada, MD;  Location: [facility];  Service: Endoscopy;;  [place]    ESOPHAGOGASTRODUODENOSCOPY (EGD) WITH PROPOFOL N/A 01/30/2019   Procedure: ESOPHAGOGASTRODUODENOSCOPY (EGD) WITH PROPOFOL;  Surgeon: Carol Ada, MD;  Location: [facility];  Service: Endoscopy;  Laterality: N/A;   gallstone removal     LAPAROTOMY N/A 10/06/2019   Procedure: EXPLORATORY LAPAROTOMY;  Surgeon: Clovis Riley, MD;  Location: [facility];  Service: General;  Laterality: N/A;       Family History  Problem Relation Age of Onset   Healthy Mother    Healthy Father    Thyroid disease Neg Hx     Social History   Tobacco Use   Smoking status: Former   Smokeless tobacco: Never  Scientific laboratory technician Use: Never used  Substance Use Topics   Alcohol use: No   Drug use: No    Home Medications Prior to Admission medications   Medication Sig Start Date End Date Taking? Authorizing Provider  alum & mag hydroxide-simeth (MAALOX MAX) 400-400-40 MG/5ML suspension Take 5 mLs by mouth every 6 (six) hours as needed for indigestion. 08/10/21  Yes Slade Pierpoint A, PA-C  lidocaine (XYLOCAINE) 2 % solution Use as directed 15 mLs in the mouth or throat 2 (two) times daily for 4 days. 08/10/21 08/14/21 Yes Thelda Gagan A, PA-C  sucralfate (CARAFATE) 1 GM/10ML suspension Take 10 mLs (1 g total) by mouth 4 (four) times daily -  with meals and at bedtime. 08/10/21  Yes Kiyoko Mcguirt A, PA-C  acetaminophen (TYLENOL) 500 MG tablet Take 2 tablets (1,000 mg total) by mouth every 8 (eight) hours. Patient taking differently: Take 1,000 mg by mouth every 8 (eight) hours as needed. pain 10/15/19   Earnstine Regal, PA-C  ALPRAZolam Duanne Moron) 0.25 MG tablet Take 0.125 mg by mouth 2 (two) times daily as needed for anxiety. 06/19/21   [provider]  amLODipine (NORVASC) 5 MG tablet Take 5 mg by mouth daily.    [provider]  chlordiazePOXIDE (LIBRIUM) 10 MG capsule Take 10 mg by mouth 2 (two) times daily as needed for anxiety.    [provider]  diclofenac Sodium (VOLTAREN)  1 % GEL Apply 2 g topically 4 (four) times daily. Patient taking differently: Apply 2 g topically 4 (four) times daily as needed (joint pain). 06/27/21   Volney American, PA-C  DULoxetine (CYMBALTA) 30 MG capsule Take 30 mg by mouth daily. 07/07/21   [provider]  hydrOXYzine (ATARAX/VISTARIL) 25 MG tablet Take 1 tablet (25 mg total) by mouth every 6 (six) hours as needed for anxiety. 08/03/21   Gareth Morgan, MD  lamoTRIgine (LAMICTAL) 25 MG tablet Take 25 mg by mouth daily. 10/17/20   [provider]  lansoprazole (PREVACID) 30 MG capsule Take 30 mg by mouth daily. 04/11/21   [provider]  loratadine (CLARITIN) 10 MG tablet 1-2 tablets 1 time per day Patient not taking: Reported on 10/08/2020 04/05/20   Jiles Prows, MD  losartan (COZAAR) 25 MG tablet Take 25 mg by mouth daily. 05/26/21   [provider]  metoprolol succinate (TOPROL-XL) 25 MG 24 hr tablet Take 25 mg  by mouth daily. 07/20/21   [provider]  mirtazapine (REMERON) 15 MG tablet Take 15 mg by mouth at bedtime. 07/18/21   [provider]  montelukast (SINGULAIR) 10 MG tablet Take 10 mg by mouth at bedtime.    [provider]  Multiple Vitamins-Iron (MULTIVITAMINS WITH IRON) TABS tablet You should get a Women's One a day vitamin with iron and take daily.  This will help with your low hemoglobin.  You can buy this at any drug store, and choose whatever brand you like. Patient taking differently: Take 1 tablet by mouth daily.  10/15/19   Earnstine Regal, PA-C  ondansetron (ZOFRAN ODT) 4 MG disintegrating tablet Take 1 tablet (4 mg total) by mouth every 8 (eight) hours as needed for nausea or vomiting. 07/08/21   Gareth Morgan, MD  pantoprazole (PROTONIX) 20 MG tablet Take 2 tablets (40 mg total) by mouth daily for 14 days. 08/03/21 08/17/21  Gareth Morgan, MD  promethazine (PHENERGAN) 12.5 MG tablet Take 1 tablet (12.5 mg total) by mouth every 6 (six) hours as  needed for nausea or vomiting. 05/17/21   Isla Pence, MD  rosuvastatin (CRESTOR) 5 MG tablet Take 5 mg by mouth daily. 05/26/21   [provider]  timolol (TIMOPTIC) 0.5 % ophthalmic solution Place 1 drop into both eyes 2 (two) times daily. 11/23/20   [provider]  tiZANidine (ZANAFLEX) 2 MG tablet Take 1 tablet (2 mg total) by mouth every 8 (eight) hours as needed for muscle spasms. Do not drink alcohol or drive while taking this medication.  May cause drowsiness. Patient taking differently: Take 2 mg by mouth every 8 (eight) hours as needed for muscle spasms. 06/27/21   Volney American, PA-C    Allergies    Alprazolam, Cyclobenzaprine, and Prednisone  Review of Systems   Review of Systems  Constitutional: Negative.   HENT: Negative.    Eyes: Negative.   Respiratory:         Burning from epigastric into chest, throat  Cardiovascular: Negative.   Gastrointestinal: Negative.   Genitourinary: Negative.   Musculoskeletal: Negative.   Skin: Negative.   Neurological: Negative.   All other systems reviewed and are negative.  Physical Exam Updated Vital Signs BP 139/85   Pulse 67   Temp 98.4 F (36.9 C) (Oral)   Resp 18   Ht 5\' 8"  (1.727 m)   Wt 72.6 kg   SpO2 100%   BMI 24.33 kg/m   Physical Exam Vitals and nursing note reviewed.  Constitutional:      General: He is not in acute distress.    Appearance: He is well-developed. He is not ill-appearing, toxic-appearing or diaphoretic.  HENT:     Head: Normocephalic and atraumatic.     Nose: Nose normal.     Mouth/Throat:     Mouth: Mucous membranes are moist.  Eyes:     Pupils: Pupils are equal, round, and reactive to light.  Neck:     Trachea: Trachea and phonation normal.  Cardiovascular:     Rate and Rhythm: Normal rate and regular rhythm.     Pulses: Normal pulses.          Radial pulses are 2+ on the right side and 2+ on the left side.       Dorsalis pedis pulses are 2+ on the right side  and 2+ on the left side.     Heart sounds: Normal heart sounds.  Pulmonary:     Effort: Pulmonary  effort is normal. No respiratory distress.     Breath sounds: Normal breath sounds and air entry.     Comments: Clear Bl Chest:     Comments: Non tender Abdominal:     General: Bowel sounds are normal. There is no distension.     Palpations: Abdomen is soft.     Tenderness: There is no abdominal tenderness. There is no right CVA tenderness, left CVA tenderness or guarding.     Hernia: No hernia is present.     Comments: Soft, non tender  Musculoskeletal:        General: No swelling, tenderness, deformity or signs of injury. Normal range of motion.     Cervical back: Full passive range of motion without pain, normal range of motion and neck supple.     Right lower leg: No edema.     Left lower leg: No edema.     Comments: No bony tenderness, compartments soft  Skin:    General: Skin is warm and dry.     Capillary Refill: Capillary refill takes less than 2 seconds.  Neurological:     General: No focal deficit present.     Mental Status: He is alert.     Sensory: Sensation is intact.     Motor: Motor function is intact.     Gait: Gait is intact.    ED Results / Procedures / Treatments   Labs (all labs ordered are listed, but only abnormal results are displayed) Labs Reviewed  COMPREHENSIVE METABOLIC PANEL - Abnormal; Notable for the following components:      Result Value   Glucose, Bld 107 (*)    Creatinine, Ser 1.46 (*)    ALT 57 (*)    GFR, Estimated 48 (*)    All other components within normal limits  CBC WITH DIFFERENTIAL/PLATELET - Abnormal; Notable for the following components:   WBC 3.8 (*)    All other components within normal limits  LIPASE, BLOOD  TROPONIN I (HIGH SENSITIVITY)  TROPONIN I (HIGH SENSITIVITY)    EKG None  Radiology DG Abd Acute W/Chest  Result Date: 08/10/2021 CLINICAL DATA:  Productive cough. EXAM: DG ABDOMEN ACUTE WITH 1 VIEW CHEST  COMPARISON:  August 03, 2021 and May 17, 2021 FINDINGS: There is no evidence of dilated bowel loops or free intraperitoneal air. A large amount of stool is seen throughout the colon. Radiopaque surgical clips are seen within the right upper quadrant. No radiopaque calculi or other significant radiographic abnormality is seen. There is mild, stable tracheal deviation to the left of midline. Heart size and mediastinal contours are otherwise within normal limits. Both lungs are clear. A radiopaque fusion plate and screws are seen overlying the lower cervical spine. IMPRESSION: Negative abdominal radiographs.  No acute cardiopulmonary disease. Electronically Signed   By: Virgina Norfolk M.D.   On: 08/10/2021 19:36    Procedures Procedures   Medications Ordered in ED Medications  alum & mag hydroxide-simeth (MAALOX/MYLANTA) 200-200-20 MG/5ML suspension 30 mL (30 mLs Oral Given 08/10/21 1920)    And  lidocaine (XYLOCAINE) 2 % viscous mouth solution 15 mL (15 mLs Oral Given 08/10/21 1920)    ED Course  I have reviewed the triage vital signs and the nursing notes.  Pertinent labs & imaging results that were available during my care of the patient were reviewed by me and considered in my medical decision making (see chart for details).  Here for evaluation of burning sensation from his epigastric to his chest and  throat for months.  He is currently followed by GI.  Known history of stricture.  Burning sensation worse with food intake.  Exertional or pleuritic chest pain.  He is afebrile, nonseptic, not ill-appearing.  Given GI cocktail while in triage was significantly improved his symptoms.  He is only intermittently compliant with his PPI.  His heart and lungs are clear.  Abdomen is soft, nontender.  There is no clinical evidence of VTE on exam.  He has a nonfocal neuro exam without deficits.  Work-up started from triage today personally reviewed and interpreted:  CBC with WBC at 3.8 Metabolic  panel glucose 785, creatinine 1.46, ALT 57 improved from prior Lipase 24 Trop 8 EKG without ischemic changes DG chest/ abd without acute abnormality  Discussed work-up with patient and family in room.  Overall patient's symptoms seem chronic in nature.  He has been seen multiple times for similar symptoms in the past.  He has close GI follow-up and has EGD planned.  He is tolerating p.o. intake here.  I have low suspicion for food bolus.  X-ray is reassuring without any pneumomediastinum, low suspicion for boerhaave.  He does not appear grossly fluid overloaded, low suspicion for acute ACS, PE, dissection or infectious process.  His abdomen is soft, nontender.  He is passing flatus.  He has no melena.  No urinary symptoms.  Discussed compliance with his PPI, unfortunately having difficulty with Carafate pills.  Will write for suspension.  Also start on Maalox.  Has close follow-up outpatient, return for new or worsening symptoms.  The patient has been appropriately medically screened and/or stabilized in the ED. I have low suspicion for any other emergent medical condition which would require further screening, evaluation or treatment in the ED or require inpatient management.  Patient is hemodynamically stable and in no acute distress.  Patient able to ambulate in department prior to ED.  Evaluation does not show acute pathology that would require ongoing or additional emergent interventions while in the emergency department or further inpatient treatment.  I have discussed the diagnosis with the patient and answered all questions.  Pain is been managed while in the emergency department and patient has no further complaints prior to discharge.  Patient is comfortable with plan discussed in room and is stable for discharge at this time.  I have discussed strict return precautions for returning to the emergency department.  Patient was encouraged to follow-up with PCP/specialist refer to at discharge.      MDM Rules/Calculators/A&P                            Final Clinical Impression(s) / ED Diagnoses Final diagnoses:  Gastroesophageal reflux disease, unspecified whether esophagitis present    Rx / DC Orders ED Discharge Orders          Ordered    sucralfate (CARAFATE) 1 GM/10ML suspension  3 times daily with meals & bedtime        08/10/21 2345    alum & mag hydroxide-simeth (MAALOX MAX) 400-400-40 MG/5ML suspension  Every 6 hours PRN        08/10/21 2345    lidocaine (XYLOCAINE) 2 % solution  2 times daily        08/10/21 2345             Geovani Tootle A, PA-C 08/11/21 [place], Scott, MD 08/11/21 [phone]

## 2021-08-10 NOTE — ED Notes (Signed)
Pt states over last several months having intermittent bouts of GERD through out the day with small amounts of frothy white sputum.  Pt also states at times his throat feels swollen and has difficulty swallowing.  Pt has been seen several times recently and prescribed Protonix which visitor states he has not been taking as prescribed.  Pt has appointment for endoscopy on the 13th.

## 2021-08-10 NOTE — Discharge Instructions (Addendum)
Your work-up the emergency department was reassuring.  I have prescribed you with medications to help with your symptoms.  It is important to follow-up with your gastroenterologist.  Continue to take your acid reducer as prescribed.  I have written you for a few medications.  As discussed in the room I prefer the liquid version of Carafate.  If your insurance is unable to cover this I have also written you for the pill form.  Do not take both the pill in the liquid form.  Also written you for some Maalox some lidocaine which is similar to the medications you took here  You need to take MiraLAX 2-3 capfuls daily for your constipation  Return for new or worsening symptoms

## 2021-08-10 NOTE — ED Provider Notes (Addendum)
Emergency Medicine Provider Triage Evaluation Note  Randy Merritt , a 80 y.o. male  was evaluated in triage.  Pt complains of spitting up white phlegm. He complains of a chest and abdominal burning sensation. He has been seen in the ED multiple times for this. Recently discharged with PPI for same. He saw GI yesterday with plans for EGD. No vomiting. He sates he was at home today when he felt like he couldn't breathe. + chest pain today. Pt also mentions as I am leaving the room that he is constipated and has not had a BM in 3 days despite taking a laxative.   Review of Systems  Positive: + GERD?, spitting up white phlegm, chest pain Negative: - fevers Physical Exam  BP 124/89 (BP Location: Left Arm)   Pulse 74   Temp 98.4 F (36.9 C) (Oral)   Resp 18   Ht 5\' 8"  (1.727 m)   Wt 72.6 kg   SpO2 99%   BMI 24.33 kg/m  Gen:   Awake, no distress   Resp:  Normal effort  MSK:   Moves extremities without difficulty  Other:  Chronic aphasia   Medical Decision Making  Medically screening exam initiated at 6:40 PM.  Appropriate orders placed.  Randy Merritt was informed that the remainder of the evaluation will be completed by another provider, this initial triage assessment does not replace that evaluation, and the importance of remaining in the ED until their evaluation is complete.      Eustaquio Maize, PA-C 08/10/21 [place], [place], DO 08/10/21 2348

## 2021-08-10 NOTE — ED Triage Notes (Addendum)
Patient is a poor historian. Writer understands that the patient is having a product cough with white sputum.  Patient stated, "I have thick white coming up when I sit at home." and does not elaborate. Patient denies chest pain    Patient added at the end of triage that he was constipated and had not had a BM in 2 days.

## 2021-08-14 DIAGNOSIS — K219 Gastro-esophageal reflux disease without esophagitis: Secondary | ICD-10-CM | POA: Diagnosis not present

## 2021-08-14 DIAGNOSIS — J069 Acute upper respiratory infection, unspecified: Secondary | ICD-10-CM | POA: Diagnosis not present

## 2021-08-17 ENCOUNTER — Other Ambulatory Visit: Payer: Self-pay

## 2021-08-17 ENCOUNTER — Ambulatory Visit: Payer: Medicare HMO | Admitting: Podiatry

## 2021-08-17 DIAGNOSIS — B351 Tinea unguium: Secondary | ICD-10-CM | POA: Diagnosis not present

## 2021-08-17 DIAGNOSIS — M79674 Pain in right toe(s): Secondary | ICD-10-CM

## 2021-08-17 DIAGNOSIS — M79675 Pain in left toe(s): Secondary | ICD-10-CM | POA: Diagnosis not present

## 2021-08-21 NOTE — Progress Notes (Signed)
Subjective: 80 y.o. returns the office today for painful, elongated, thickened toenails which he  cannot trim himself.  He has 1 small area of dried blood on the toenail on the right side.  Not causing any pain, swelling or redness or any drainage.  Denies any acute changes since last appointment and no new complaints today. Denies any systemic complaints such as fevers, chills, nausea, vomiting.   PCP: Shirline Frees, MD  Objective: AAO 3, NAD DP/PT pulses palpable, CRT less than 3 seconds Nails hypertrophic, dystrophic, elongated, brittle, discolored 10. There is tenderness overlying the nails 1-5 bilaterally. There is no surrounding erythema or drainage along the nail sites. No open lesions or pre-ulcerative lesions are identified. Status post partial nail avulsion right hallux which is healed.  There is 1 small amount of dried blood present and this was in the central portion of the toenail. No other areas of tenderness bilateral lower extremities. No overlying edema, erythema, increased warmth. No pain with calf compression, swelling, warmth, erythema.  Assessment: Patient presents with symptomatic onychomycosis  Plan: -Treatment options including alternatives, risks, complications were discussed -Nails sharply debrided 10 without complication/bleeding. -Discussed that the dried blood will should resolve over time. -Discussed daily foot inspection. If there are any changes, to call the office immediately.  -Follow-up in 3 months or sooner if any problems are to arise. In the meantime, encouraged to call the office with any questions, concerns, changes symptoms.  Celesta Gentile, DPM

## 2021-08-24 DIAGNOSIS — R1310 Dysphagia, unspecified: Secondary | ICD-10-CM | POA: Diagnosis not present

## 2021-08-24 DIAGNOSIS — R1319 Other dysphagia: Secondary | ICD-10-CM | POA: Diagnosis not present

## 2021-08-24 DIAGNOSIS — R079 Chest pain, unspecified: Secondary | ICD-10-CM | POA: Diagnosis not present

## 2021-08-24 DIAGNOSIS — K3189 Other diseases of stomach and duodenum: Secondary | ICD-10-CM | POA: Diagnosis not present

## 2021-08-24 DIAGNOSIS — K319 Disease of stomach and duodenum, unspecified: Secondary | ICD-10-CM | POA: Diagnosis not present

## 2021-08-24 DIAGNOSIS — R0789 Other chest pain: Secondary | ICD-10-CM | POA: Diagnosis not present

## 2021-08-30 ENCOUNTER — Ambulatory Visit: Payer: Medicare HMO | Admitting: Internal Medicine

## 2021-09-05 DIAGNOSIS — R0789 Other chest pain: Secondary | ICD-10-CM | POA: Diagnosis not present

## 2021-09-05 DIAGNOSIS — I10 Essential (primary) hypertension: Secondary | ICD-10-CM | POA: Diagnosis not present

## 2021-09-24 ENCOUNTER — Emergency Department (HOSPITAL_COMMUNITY): Admission: EM | Admit: 2021-09-24 | Payer: Medicare HMO | Source: Home / Self Care

## 2021-09-24 ENCOUNTER — Ambulatory Visit (HOSPITAL_COMMUNITY)
Admission: RE | Admit: 2021-09-24 | Discharge: 2021-09-24 | Disposition: A | Discharge status: Home / Self Care | Payer: Medicare HMO | Attending: Psychiatry | Admitting: Psychiatry

## 2021-09-24 NOTE — H&P (Signed)
Behavioral Health Medical Screening Exam  Randy Merritt is an 80 y.o. male who presented alone to [facility] via his own vehicle with note stating,  "Randy Merritt states that he wants to be seen at the behavioral health center. He has been complaining that his mind has not been right and he feels that he needs a lot of help. He has a hard time articulating his needs. Please assist him. Thank you.  [name] [phone] wife Randy Merritt [phone]) stepdaughter [phone]"  Patient presents calm cooperative, and pleasant; alert, oriented to person, place and disoriented to time, and specifics of situation. States he's having "problems with my mind but I can't explain it. Sometimes I'm fine, sometimes I'm not. I don't want to hurt myself but I know somethings not right". 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 Poor recall of dates and time; states it as his baseline. He endorses poor sleep and anxiousness. He later describes living in uncomfortable conditions; old house where he describes his wife as having hoarding behavior and poor house keeping. He endorses increased GERD symptoms at night that make it hard to sleep; recently had esophagus dilated due to his inability to swallow. Says his GERD causes chest discomfort and issues breathing at night; admits to not being compliant with his medications. Says he showers weekly; unable to state how often. Says wife cooks meals and he eats when able. Says at night he feels "pressure in my head go up, everytime I lay down, thinking". Endorses ruminating thoughts; unclear of subjects but states he is unable to "turn it off". Says his wife "fusses at me a lot because I will ask her the same thing over and over she says".   He completed serial 2's, unable to complete serial 7's due to educational status. He is able to recall month, year "2021 or  2022", unable to recall president. Able to interpret proverbs. Unable to complete 3 word recall, wedding anniversary, siblings names, or deceased daughter's name. Poverty of speech present; unable to distinguish if cognitive or lack of education. Patient currently drives; drove himself to [facility]. States he feels his wife "don't support like I need sometimes". He provided verbal permission to contact wife and stepdaughter listed on sheet of paper he presented on assessment.   Per chart review. 10/08/20 [facility]  noted patient was "unable to complete thoughts". 07/01/20- TOC patient was discharged with [facility], PT, Speech Therapy and to follow up with [facility]. Positive for goiter; difficulty swallowing. No documented follow-up appointments with neurology noted.   He denies any suicidal or homicidal ideations, auditory or visual hallucinations,and is not actively psychotic. There does appear to be some possible cognitive deficits present affecting his memory.   Collateral:  [name] (wife) [phone] "My daughter wrote it out for him. He's saying he's depressed and he needs to have some time to himself to get better. He's sickly as well with physical issues. Saying he has sounds in his head. I dont' have a lot of time because I'm at church. I never thought much about it (memory) because he is 48. Dr Kenton Kingfisher tried him on different medicines because of his anxiety and tried to get him a referral for a place like [facility]. Says sometimes; he sometimes he says the same things over and over. He sleeps some but he wakes up throughout the night to spit. He spits all night  because of his GERD". Denies any increased confusion at night; states he gets more confused during the day. States he's not "that bad". She denies any safety concerns at this time. Wife cut call short due to her being at church.   Randy Merritt ([id number])[phone] 1275- no answer [phone]: States she wrote note because he states  he's unable to find words, says "he's been having this issue for a while and my mom says she knows nothing about this. Patient is on "a lot of medications for his health"; he complains about thoughts racing, anxious, can't speak a lot of times, and heart beating fast. Says he's always worrying about stuff. Every time I talk to him for the past 2 months he's saying something is wrong with his head. 2 of his sons and his daughter that passed away have behavioral health issues. Says he has pretty good memory. States over past 2 months has had a lot of issues word finding. Patient had "bad surgery" around Thanksgiving; he went in for colonoscopy and they damaged his colon. He ended up in the emergency room and I've noticed he hasn't been right since. Patient takes a nap here and there during the day; but not sure of his sleep at night.  "My mom is supportive but not as supportive that I think she should be. She is more concerned with church than what's going on with him and I told her last night she needs to be more responsible because she is much younger than him (16). He mows the grass, wash himself, dress himself; at one point he wasn't eating but they recently dilated his throat about a month ago so I believe that's helped. I'm willing to help as much as possible because I agree there is a change and I will help make his appointments and get him there but my mom has to step up too and that's a conversation we have to have". Stepdaughter denies any safety concerns regarding patient at this time and doesn't feel he is a threat to himself or others. Says she is on her way to ensure he gets home safely but states patient does drive and hasn't had any issues. Provider discussed need to follow up with appointments and take medications as prescribed for better management of symptoms in which she agreed.    Total Time spent with patient: 20 minutes  Psychiatric Specialty Exam: Physical Exam Vitals and nursing note  reviewed.  Constitutional:      Appearance: Normal appearance. He is normal weight.  HENT:     Head: Normocephalic.     Nose: Nose normal.     Mouth/Throat:     Mouth: Mucous membranes are moist.     Pharynx: Oropharynx is clear.  Eyes:     Pupils: Pupils are equal, round, and reactive to light.  Cardiovascular:     Rate and Rhythm: Normal rate.     Pulses: Normal pulses.  Pulmonary:     Effort: Pulmonary effort is normal.  Musculoskeletal:        General: Normal range of motion.     Cervical back: Normal range of motion.  Skin:    General: Skin is warm and dry.  Neurological:     Mental Status: He is alert. He is disoriented.     Motor: No weakness or tremor.     Coordination: Coordination normal.     Gait: Gait is intact.  Psychiatric:        Attention and Perception: Attention  and perception normal. He does not perceive auditory or visual hallucinations.        Mood and Affect: Mood and affect normal.        Behavior: Behavior normal. Behavior is cooperative.        Thought Content: Thought content is not paranoid or delusional. Thought content does not include homicidal or suicidal ideation. Thought content does not include homicidal or suicidal plan.        Cognition and Memory: Memory is impaired.   Review of Systems  Constitutional:  Positive for appetite change.  Genitourinary:  Positive for frequency.  Neurological:  Positive for speech difficulty.  Psychiatric/Behavioral:  Positive for confusion and sleep disturbance.   All other systems reviewed and are negative. There were no vitals taken for this visit.There is no height or weight on file to calculate BMI. General Appearance: Disheveled Eye Contact:  Good Speech:  Garbled and noted speech impediment Volume:  Normal Mood:  Euthymic Affect:  Congruent Thought Process:  Coherent Orientation:  Other:  person, place (vague); not time, unclear on situation Thought Content:  Logical Suicidal Thoughts:   No Homicidal Thoughts:  No Memory:  Immediate;   inconsistent Recent;   Poor Remote;   Poor Judgement:  Other:  inconsistent Insight:  Shallow Psychomotor Activity:  Normal Concentration: Concentration: Fair and Attention Span: Fair Recall:  Poor Fund of Knowledge:Poor Language:  impoverished speech; issues word finding Akathisia:  NA Handed:   AIMS (if indicated):    Assets:  Desire for Improvement Financial Resources/Insurance Housing Resilience Transportation Sleep:     Musculoskeletal: Strength & Muscle Tone: within normal limits Gait & Station: normal Patient leans: N/A  There were no vitals taken for this visit.  Recommendations: Based on my evaluation the patient does not appear to have an emergency medical condition. Patient 's family notified, step-daughter in route and understands need to follow up with neuro-psychology and patient's medical doctor's for clear guidance on patient's presentation and care. Denies any safety concerns; both patient and step-daughter contract for safety. Resources for [facility] and [employer] provided.    Inda Merlin, NP 09/24/2021, 10:47 AM

## 2021-09-25 ENCOUNTER — Ambulatory Visit: Payer: Medicare HMO | Admitting: Podiatry

## 2021-10-16 DIAGNOSIS — I10 Essential (primary) hypertension: Secondary | ICD-10-CM | POA: Diagnosis not present

## 2021-10-16 DIAGNOSIS — R4701 Aphasia: Secondary | ICD-10-CM | POA: Diagnosis not present

## 2021-10-16 DIAGNOSIS — E7800 Pure hypercholesterolemia, unspecified: Secondary | ICD-10-CM | POA: Diagnosis not present

## 2021-10-16 DIAGNOSIS — F411 Generalized anxiety disorder: Secondary | ICD-10-CM | POA: Diagnosis not present

## 2021-10-16 DIAGNOSIS — K2100 Gastro-esophageal reflux disease with esophagitis, without bleeding: Secondary | ICD-10-CM | POA: Diagnosis not present

## 2021-11-01 DIAGNOSIS — N401 Enlarged prostate with lower urinary tract symptoms: Secondary | ICD-10-CM | POA: Diagnosis not present

## 2021-11-01 DIAGNOSIS — R351 Nocturia: Secondary | ICD-10-CM | POA: Diagnosis not present

## 2021-11-01 DIAGNOSIS — R972 Elevated prostate specific antigen [PSA]: Secondary | ICD-10-CM | POA: Diagnosis not present

## 2021-11-01 DIAGNOSIS — R3912 Poor urinary stream: Secondary | ICD-10-CM | POA: Diagnosis not present

## 2021-11-02 DIAGNOSIS — K2100 Gastro-esophageal reflux disease with esophagitis, without bleeding: Secondary | ICD-10-CM | POA: Diagnosis not present

## 2021-11-02 DIAGNOSIS — F410 Panic disorder [episodic paroxysmal anxiety] without agoraphobia: Secondary | ICD-10-CM | POA: Diagnosis not present

## 2021-11-02 DIAGNOSIS — I10 Essential (primary) hypertension: Secondary | ICD-10-CM | POA: Diagnosis not present

## 2021-11-02 DIAGNOSIS — E7800 Pure hypercholesterolemia, unspecified: Secondary | ICD-10-CM | POA: Diagnosis not present

## 2021-11-02 DIAGNOSIS — R479 Unspecified speech disturbances: Secondary | ICD-10-CM | POA: Diagnosis not present

## 2021-11-11 ENCOUNTER — Emergency Department (HOSPITAL_COMMUNITY): Payer: Medicare HMO

## 2021-11-11 ENCOUNTER — Other Ambulatory Visit: Payer: Self-pay

## 2021-11-11 ENCOUNTER — Encounter (HOSPITAL_COMMUNITY): Payer: Self-pay | Admitting: Emergency Medicine

## 2021-11-11 ENCOUNTER — Emergency Department (HOSPITAL_COMMUNITY)
Admission: EM | Admit: 2021-11-11 | Discharge: 2021-11-11 | Disposition: A | Discharge status: Home / Self Care | Payer: Medicare HMO | Attending: Emergency Medicine | Admitting: Emergency Medicine

## 2021-11-11 DIAGNOSIS — I129 Hypertensive chronic kidney disease with stage 1 through stage 4 chronic kidney disease, or unspecified chronic kidney disease: Secondary | ICD-10-CM | POA: Diagnosis not present

## 2021-11-11 DIAGNOSIS — N183 Chronic kidney disease, stage 3 unspecified: Secondary | ICD-10-CM | POA: Diagnosis not present

## 2021-11-11 DIAGNOSIS — R0602 Shortness of breath: Secondary | ICD-10-CM | POA: Insufficient documentation

## 2021-11-11 DIAGNOSIS — Z87891 Personal history of nicotine dependence: Secondary | ICD-10-CM | POA: Insufficient documentation

## 2021-11-11 DIAGNOSIS — Z79899 Other long term (current) drug therapy: Secondary | ICD-10-CM | POA: Insufficient documentation

## 2021-11-11 DIAGNOSIS — R350 Frequency of micturition: Secondary | ICD-10-CM | POA: Diagnosis not present

## 2021-11-11 DIAGNOSIS — R079 Chest pain, unspecified: Secondary | ICD-10-CM | POA: Diagnosis not present

## 2021-11-11 DIAGNOSIS — R0600 Dyspnea, unspecified: Secondary | ICD-10-CM

## 2021-11-11 LAB — HEPATIC FUNCTION PANEL
ALT: 22 U/L (ref 0–44)
AST: 23 U/L (ref 15–41)
Albumin: 4.5 g/dL (ref 3.5–5.0)
Alkaline Phosphatase: 104 U/L (ref 38–126)
Bilirubin, Direct: 0.1 mg/dL (ref 0.0–0.2)
Indirect Bilirubin: 0.9 mg/dL (ref 0.3–0.9)
Total Bilirubin: 1 mg/dL (ref 0.3–1.2)
Total Protein: 7.5 g/dL (ref 6.5–8.1)

## 2021-11-11 LAB — URINALYSIS, ROUTINE W REFLEX MICROSCOPIC
Bacteria, UA: NONE SEEN
Bilirubin Urine: NEGATIVE
Glucose, UA: NEGATIVE mg/dL
Hgb urine dipstick: NEGATIVE
Ketones, ur: NEGATIVE mg/dL
Leukocytes,Ua: NEGATIVE
Nitrite: NEGATIVE
Protein, ur: 30 mg/dL — AB
Specific Gravity, Urine: 1.019 (ref 1.005–1.030)
pH: 5 (ref 5.0–8.0)

## 2021-11-11 LAB — BASIC METABOLIC PANEL
Anion gap: 9 (ref 5–15)
BUN: 16 mg/dL (ref 8–23)
CO2: 30 mmol/L (ref 22–32)
Calcium: 9.6 mg/dL (ref 8.9–10.3)
Chloride: 102 mmol/L (ref 98–111)
Creatinine, Ser: 1.2 mg/dL (ref 0.61–1.24)
GFR, Estimated: >60 mL/min (ref >60)
Glucose, Bld: 138 mg/dL — ABNORMAL HIGH (ref 70–99)
Potassium: 3.9 mmol/L (ref 3.5–5.1)
Sodium: 141 mmol/L (ref 135–145)

## 2021-11-11 LAB — CBC
HCT: 41.8 % (ref 39.0–52.0)
Hemoglobin: 13.9 g/dL (ref 13.0–17.0)
MCH: 31.7 pg (ref 26.0–34.0)
MCHC: 33.3 g/dL (ref 30.0–36.0)
MCV: 95.2 fL (ref 80.0–100.0)
Platelets: 159 10*3/uL (ref 150–400)
RBC: 4.39 MIL/uL (ref 4.22–5.81)
RDW: 12.5 % (ref 11.5–15.5)
WBC: 2.9 10*3/uL — ABNORMAL LOW (ref 4.0–10.5)
nRBC: 0 % (ref 0.0–0.2)

## 2021-11-11 LAB — TROPONIN I (HIGH SENSITIVITY): Troponin I (High Sensitivity): 5 ng/L (ref <18)

## 2021-11-11 NOTE — ED Triage Notes (Signed)
Pt reports frequent urination since being d/c 2weeks ago. Pt also reports difficulty breathing x a few weeks.

## 2021-11-11 NOTE — ED Provider Notes (Signed)
[place] DEPT Provider Note   CSN: [id number] Arrival date & time: 11/11/21  0907     History Chief Complaint  Patient presents with   Shortness of Breath   Urinary Frequency    Kilan Banfill is a 80 y.o. male.  80 year old male presents with dyspnea x1 month.  Denies any fever or chills.  No cough or congestion.  Patient is difficult historian.  No exertional component to this.  Has not seen his doctor for this.      Past Medical History:  Diagnosis Date   Acid reflux    Anxiety    Arthritis    Depression    " a little"   Goiter    High cholesterol    History of blood transfusion    Perforated bowel (HCC)    Seasonal allergies     Patient Active Problem List   Diagnosis Date Noted   Chronic constipation 02/15/2021   Cervical radiculopathy 01/24/2021   History of cervical spinal arthrodesis 01/24/2021   Essential (primary) hypertension 01/16/2021   Neck pain 01/16/2021   Aphasia 07/01/2020   TIA (transient ischemic attack) 06/29/2020   Globus pharyngeus 05/20/2020   Goiter 12/24/2019   Perforated sigmoid colon ([place]) 10/06/2019   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/2019   Anxiety 04/14/2018   BPH (benign prostatic hyperplasia) 04/14/2018   CKD (chronic kidney disease) stage 3, GFR 30-59 ml/min ([place]) 04/14/2018   History of adenomatous polyp of colon 04/14/2018   Hyperlipidemia 04/14/2018   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   Ingrown toenail 10/11/2017   Chondrodermatitis nodularis helicis of right ear 25/95/6387   Oropharyngeal dysphagia 02/02/2017   Chronic rhinitis 02/01/2017   Gastroesophageal reflux disease 02/01/2017    Past Surgical History:  Procedure Laterality Date   ANTERIOR CERVICAL DECOMP/DISCECTOMY FUSION N/A 04/08/2013   Procedure: ANTERIOR CERVICAL DECOMPRESSION/DISCECTOMY FUSION 1 LEVEL;  Surgeon: Floyce Stakes, MD;  Location: [facility];  Service: Neurosurgery;  Laterality: N/A;   Cervical five-six Anterior cervical decompression/diskectomy fusion   APPENDECTOMY N/A 10/06/2019   Procedure: APPENDECTOMY;  Surgeon: Clovis Riley, MD;  Location: [place];  Service: General;  Laterality: N/A;   BIOPSY  01/30/2019   Procedure: BIOPSY;  Surgeon: Carol Ada, MD;  Location: [facility];  Service: Endoscopy;;   CERVICAL DISCECTOMY     x2   CHOLECYSTECTOMY     COLON RESECTION SIGMOID N/A 10/06/2019   Procedure: COLON RESECTION SIGMOID;  Surgeon: Clovis Riley, MD;  Location: [place];  Service: General;  Laterality: N/A;   ESOPHAGEAL DILATION  01/30/2019   Procedure: ESOPHAGEAL DILATION;  Surgeon: Carol Ada, MD;  Location: [facility];  Service: Endoscopy;;  [place]   ESOPHAGOGASTRODUODENOSCOPY (EGD) WITH PROPOFOL N/A 01/30/2019   Procedure: ESOPHAGOGASTRODUODENOSCOPY (EGD) WITH PROPOFOL;  Surgeon: Carol Ada, MD;  Location: [facility];  Service: Endoscopy;  Laterality: N/A;   gallstone removal     LAPAROTOMY N/A 10/06/2019   Procedure: EXPLORATORY LAPAROTOMY;  Surgeon: Clovis Riley, MD;  Location: [facility];  Service: General;  Laterality: N/A;       Family History  Problem Relation Age of Onset   Healthy Mother    Healthy Father    Thyroid disease Neg Hx     Social History   Tobacco Use   Smoking status: Former   Smokeless tobacco: Never  Scientific laboratory technician Use: Never used  Substance Use Topics   Alcohol use: No   Drug use: No    Home  Medications Prior to Admission medications   Medication Sig Start Date End Date Taking? Authorizing Provider  acetaminophen (TYLENOL) 500 MG tablet Take 2 tablets (1,000 mg total) by mouth every 8 (eight) hours. Patient taking differently: Take 1,000 mg by mouth every 8 (eight) hours as needed. pain 10/15/19   Earnstine Regal, PA-C  ALPRAZolam Duanne Moron) 0.25 MG tablet Take 0.125 mg by mouth 2 (two) times daily as needed for anxiety. 06/19/21   [provider]  alum & mag hydroxide-simeth (MAALOX MAX)  400-400-40 MG/5ML suspension Take 5 mLs by mouth every 6 (six) hours as needed for indigestion. 08/10/21   Henderly, Britni A, PA-C  amLODipine (NORVASC) 5 MG tablet Take 5 mg by mouth daily.    [provider]  chlordiazePOXIDE (LIBRIUM) 10 MG capsule Take 10 mg by mouth 2 (two) times daily as needed for anxiety.    [provider]  diclofenac Sodium (VOLTAREN) 1 % GEL Apply 2 g topically 4 (four) times daily. Patient taking differently: Apply 2 g topically 4 (four) times daily as needed (joint pain). 06/27/21   Volney American, PA-C  DULoxetine (CYMBALTA) 30 MG capsule Take 30 mg by mouth daily. 07/07/21   [provider]  hydrOXYzine (ATARAX/VISTARIL) 25 MG tablet Take 1 tablet (25 mg total) by mouth every 6 (six) hours as needed for anxiety. 08/03/21   Gareth Morgan, MD  lamoTRIgine (LAMICTAL) 25 MG tablet Take 25 mg by mouth daily. 10/17/20   [provider]  lansoprazole (PREVACID) 30 MG capsule Take 30 mg by mouth daily. 04/11/21   [provider]  loratadine (CLARITIN) 10 MG tablet 1-2 tablets 1 time per day Patient not taking: Reported on 10/08/2020 04/05/20   Jiles Prows, MD  losartan (COZAAR) 25 MG tablet Take 25 mg by mouth daily. 05/26/21   [provider]  metoprolol succinate (TOPROL-XL) 25 MG 24 hr tablet Take 25 mg by mouth daily. 07/20/21   [provider]  mirtazapine (REMERON) 15 MG tablet Take 15 mg by mouth at bedtime. 07/18/21   [provider]  montelukast (SINGULAIR) 10 MG tablet Take 10 mg by mouth at bedtime.    [provider]  Multiple Vitamins-Iron (MULTIVITAMINS WITH IRON) TABS tablet You should get a Women's One a day vitamin with iron and take daily.  This will help with your low hemoglobin.  You can buy this at any drug store, and choose whatever brand you like. Patient taking differently: Take 1 tablet by mouth daily.  10/15/19   Earnstine Regal, PA-C  ondansetron (ZOFRAN ODT) 4 MG  disintegrating tablet Take 1 tablet (4 mg total) by mouth every 8 (eight) hours as needed for nausea or vomiting. 07/08/21   Gareth Morgan, MD  pantoprazole (PROTONIX) 20 MG tablet Take 2 tablets (40 mg total) by mouth daily for 14 days. 08/03/21 08/17/21  Gareth Morgan, MD  promethazine (PHENERGAN) 12.5 MG tablet Take 1 tablet (12.5 mg total) by mouth every 6 (six) hours as needed for nausea or vomiting. 05/17/21   Isla Pence, MD  rosuvastatin (CRESTOR) 5 MG tablet Take 5 mg by mouth daily. 05/26/21   [provider]  sucralfate (CARAFATE) 1 GM/10ML suspension Take 10 mLs (1 g total) by mouth 4 (four) times daily -  with meals and at bedtime. 08/10/21   Henderly, Britni A, PA-C  timolol (TIMOPTIC) 0.5 % ophthalmic solution Place 1 drop into both eyes 2 (two) times daily. 11/23/20   [provider]  tiZANidine (ZANAFLEX) 2  MG tablet Take 1 tablet (2 mg total) by mouth every 8 (eight) hours as needed for muscle spasms. Do not drink alcohol or drive while taking this medication.  May cause drowsiness. Patient taking differently: Take 2 mg by mouth every 8 (eight) hours as needed for muscle spasms. 06/27/21   Volney American, PA-C    Allergies    Alprazolam, Cyclobenzaprine, and Prednisone  Review of Systems   Review of Systems  All other systems reviewed and are negative.  Physical Exam Updated Vital Signs BP 122/70    Pulse 80    Temp 98.2 F (36.8 C) (Oral)    Resp 15    SpO2 100%   Physical Exam Vitals and nursing note reviewed.  Constitutional:      General: He is not in acute distress.    Appearance: Normal appearance. He is well-developed. He is not toxic-appearing.  HENT:     Head: Normocephalic and atraumatic.  Eyes:     General: Lids are normal.     Conjunctiva/sclera: Conjunctivae normal.     Pupils: Pupils are equal, round, and reactive to light.  Neck:     Thyroid: No thyroid mass.     Trachea: No tracheal deviation.  Cardiovascular:     Rate  and Rhythm: Normal rate and regular rhythm.     Heart sounds: Normal heart sounds. No murmur heard.   No gallop.  Pulmonary:     Effort: Pulmonary effort is normal. No respiratory distress.     Breath sounds: Normal breath sounds. No stridor. No decreased breath sounds, wheezing, rhonchi or rales.  Abdominal:     General: There is no distension.     Palpations: Abdomen is soft.     Tenderness: There is no abdominal tenderness. There is no rebound.  Musculoskeletal:        General: No tenderness. Normal range of motion.     Cervical back: Normal range of motion and neck supple.  Skin:    General: Skin is warm and dry.     Findings: No abrasion or rash.  Neurological:     Mental Status: He is alert and oriented to person, place, and time. Mental status is at baseline.     GCS: GCS eye subscore is 4. GCS verbal subscore is 5. GCS motor subscore is 6.     Cranial Nerves: No cranial nerve deficit.     Sensory: No sensory deficit.     Motor: Motor function is intact.  Psychiatric:        Attention and Perception: Attention normal.        Speech: Speech normal.        Behavior: Behavior normal.    ED Results / Procedures / Treatments   Labs (all labs ordered are listed, but only abnormal results are displayed) Labs Reviewed  BASIC METABOLIC PANEL  CBC  URINALYSIS, ROUTINE W REFLEX MICROSCOPIC  HEPATIC FUNCTION PANEL  TROPONIN I (HIGH SENSITIVITY)    EKG EKG Interpretation  Date/Time:  Saturday November 11 2021 10:01:40 EST Ventricular Rate:  77 PR Interval:  155 QRS Duration: 77 QT Interval:  364 QTC Calculation: 412 R Axis:   75 Text Interpretation: Sinus rhythm Biatrial enlargement Confirmed by Lacretia Leigh ([id number]) on 11/11/2021 10:52:31 AM  Radiology No results found.  Procedures Procedures   Medications Ordered in ED Medications - No data to display  ED Course  I have reviewed the triage vital signs and the nursing notes.  Pertinent labs & imaging  results  that were available during my care of the patient were reviewed by me and considered in my medical decision making (see chart for details).    MDM Rules/Calculators/A&P                         Patient's work-up here is negative.  Chest x-ray without acute findings.  No evidence of heart failure or CHF.  No anemia noted on CBC.  Is nontoxic-appearing.  Low suspicion for ACS or PE.  Will discharge home    Final Clinical Impression(s) / ED Diagnoses Final diagnoses:  None    Rx / DC Orders ED Discharge Orders     None        Lacretia Leigh, MD 11/11/21 1209

## 2021-11-11 NOTE — ED Provider Notes (Signed)
Emergency Medicine Provider Triage Evaluation Note  Randy Merritt , a 80 y.o. male  was evaluated in triage.  Pt complains of multiple complaints.  Review of Systems  Positive: Chest pressure, sob, dysuria Negative: Fever, cough, exertional cp, abd pain  Physical Exam  BP 122/70    Pulse 80    Temp 98.2 F (36.8 C) (Oral)    Resp 15    SpO2 100%  Gen:   Awake, no distress   Resp:  Normal effort  MSK:   Moves extremities without difficulty  Other:    Medical Decision Making  Medically screening exam initiated at 9:52 AM.  Appropriate orders placed.  Randy Merritt was informed that the remainder of the evaluation will be completed by another provider, this initial triage assessment does not replace that evaluation, and the importance of remaining in the ED until their evaluation is complete.  It is quite difficult to obtain hx from pt as he is a poor historian.  Pt report chest pressure and sob x 1 month, sometimes worse at night. Occasional bouts of nausea.  Also report for the past few days he notice some increase urinary frequency and leakage.  Report having a colonoscopy 2 years ago that went wrong and he apparently had an open laparotomy.     Domenic Moras, PA-C 11/11/21 2035    Lacretia Leigh, MD 11/20/21 1021

## 2021-11-23 ENCOUNTER — Ambulatory Visit: Payer: Medicare HMO | Admitting: Podiatry

## 2021-12-04 DIAGNOSIS — I10 Essential (primary) hypertension: Secondary | ICD-10-CM | POA: Diagnosis not present

## 2021-12-04 DIAGNOSIS — R519 Headache, unspecified: Secondary | ICD-10-CM | POA: Diagnosis not present

## 2021-12-06 DIAGNOSIS — K222 Esophageal obstruction: Secondary | ICD-10-CM | POA: Diagnosis not present

## 2021-12-06 DIAGNOSIS — I10 Essential (primary) hypertension: Secondary | ICD-10-CM | POA: Diagnosis not present

## 2021-12-06 DIAGNOSIS — E785 Hyperlipidemia, unspecified: Secondary | ICD-10-CM | POA: Diagnosis not present

## 2021-12-06 DIAGNOSIS — N189 Chronic kidney disease, unspecified: Secondary | ICD-10-CM | POA: Diagnosis not present

## 2021-12-06 DIAGNOSIS — K219 Gastro-esophageal reflux disease without esophagitis: Secondary | ICD-10-CM | POA: Diagnosis not present

## 2021-12-06 DIAGNOSIS — R7303 Prediabetes: Secondary | ICD-10-CM | POA: Diagnosis not present

## 2021-12-19 DIAGNOSIS — E7800 Pure hypercholesterolemia, unspecified: Secondary | ICD-10-CM | POA: Diagnosis not present

## 2021-12-19 DIAGNOSIS — I10 Essential (primary) hypertension: Secondary | ICD-10-CM | POA: Diagnosis not present

## 2021-12-19 DIAGNOSIS — F410 Panic disorder [episodic paroxysmal anxiety] without agoraphobia: Secondary | ICD-10-CM | POA: Diagnosis not present

## 2021-12-19 DIAGNOSIS — R479 Unspecified speech disturbances: Secondary | ICD-10-CM | POA: Diagnosis not present

## 2022-01-22 DIAGNOSIS — I10 Essential (primary) hypertension: Secondary | ICD-10-CM | POA: Diagnosis not present

## 2022-01-22 DIAGNOSIS — F411 Generalized anxiety disorder: Secondary | ICD-10-CM | POA: Diagnosis not present

## 2022-01-22 DIAGNOSIS — R413 Other amnesia: Secondary | ICD-10-CM | POA: Diagnosis not present

## 2022-02-12 ENCOUNTER — Encounter: Payer: Self-pay | Admitting: Podiatry

## 2022-02-12 ENCOUNTER — Ambulatory Visit: Payer: Medicare HMO | Admitting: Podiatry

## 2022-02-12 DIAGNOSIS — R413 Other amnesia: Secondary | ICD-10-CM | POA: Diagnosis not present

## 2022-02-12 DIAGNOSIS — I10 Essential (primary) hypertension: Secondary | ICD-10-CM | POA: Diagnosis not present

## 2022-02-12 DIAGNOSIS — F3340 Major depressive disorder, recurrent, in remission, unspecified: Secondary | ICD-10-CM | POA: Insufficient documentation

## 2022-02-12 DIAGNOSIS — M79674 Pain in right toe(s): Secondary | ICD-10-CM | POA: Diagnosis not present

## 2022-02-12 DIAGNOSIS — F410 Panic disorder [episodic paroxysmal anxiety] without agoraphobia: Secondary | ICD-10-CM | POA: Diagnosis not present

## 2022-02-12 DIAGNOSIS — L600 Ingrowing nail: Secondary | ICD-10-CM

## 2022-02-12 DIAGNOSIS — M79675 Pain in left toe(s): Secondary | ICD-10-CM

## 2022-02-12 DIAGNOSIS — B351 Tinea unguium: Secondary | ICD-10-CM

## 2022-02-12 DIAGNOSIS — D696 Thrombocytopenia, unspecified: Secondary | ICD-10-CM | POA: Insufficient documentation

## 2022-02-12 DIAGNOSIS — E042 Nontoxic multinodular goiter: Secondary | ICD-10-CM | POA: Insufficient documentation

## 2022-02-12 DIAGNOSIS — E7800 Pure hypercholesterolemia, unspecified: Secondary | ICD-10-CM | POA: Diagnosis not present

## 2022-02-12 DIAGNOSIS — G8929 Other chronic pain: Secondary | ICD-10-CM | POA: Insufficient documentation

## 2022-02-12 DIAGNOSIS — R479 Unspecified speech disturbances: Secondary | ICD-10-CM | POA: Diagnosis not present

## 2022-02-12 DIAGNOSIS — J301 Allergic rhinitis due to pollen: Secondary | ICD-10-CM | POA: Insufficient documentation

## 2022-02-12 DIAGNOSIS — F5101 Primary insomnia: Secondary | ICD-10-CM | POA: Insufficient documentation

## 2022-02-12 DIAGNOSIS — F411 Generalized anxiety disorder: Secondary | ICD-10-CM | POA: Diagnosis not present

## 2022-02-12 DIAGNOSIS — F331 Major depressive disorder, recurrent, moderate: Secondary | ICD-10-CM | POA: Insufficient documentation

## 2022-02-12 DIAGNOSIS — R519 Headache, unspecified: Secondary | ICD-10-CM | POA: Diagnosis not present

## 2022-02-12 DIAGNOSIS — K2100 Gastro-esophageal reflux disease with esophagitis, without bleeding: Secondary | ICD-10-CM | POA: Diagnosis not present

## 2022-02-12 NOTE — Progress Notes (Signed)
This patient returns to my office for at risk foot care.  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  This patient has nail spicule at base medial border right  big toenail.  This patient is unable to cut nails himself since the patient cannot reach his nails.These nails are painful walking and wearing shoes.  This patient presents for at risk foot care today. ? ?General Appearance  Alert, conversant and in no acute stress. ? ?Vascular  Dorsalis pedis and posterior tibial  pulses are palpable  bilaterally.  Capillary return is within normal limits  bilaterally. Temperature is within normal limits  bilaterally. ? ?Neurologic  Senn-Weinstein monofilament wire test within normal limits  bilaterally. Muscle power within normal limits bilaterally. ? ?Nails Thick disfigured discolored nails with subungual debris  from hallux to fifth toes bilaterally. No evidence of bacterial infection or drainage bilaterally. Nail spicule right hallux. ? ?Orthopedic  No limitations of motion  feet .  No crepitus or effusions noted.  No bony pathology or digital deformities noted. ? ?Skin  normotropic skin with no porokeratosis noted bilaterally.  No signs of infections or ulcers noted.    ? ?Onychomycosis  Pain in right toes  Pain in left toes ? ?Consent was obtained for treatment procedures.   Mechanical debridement of nails 1-5  bilaterally performed with a nail nipper.  Filed with dremel without incident.  ? ? ?Return office visit   prn                  Told patient to return for periodic foot care and evaluation due to potential at risk complications. ? ? ?Gardiner Barefoot DPM   ?

## 2022-03-01 ENCOUNTER — Ambulatory Visit (HOSPITAL_COMMUNITY)
Admission: EM | Admit: 2022-03-01 | Discharge: 2022-03-01 | Disposition: A | Discharge status: Home / Self Care | Payer: Medicare HMO | Attending: Family Medicine | Admitting: Family Medicine

## 2022-03-01 ENCOUNTER — Encounter (HOSPITAL_COMMUNITY): Payer: Self-pay

## 2022-03-01 DIAGNOSIS — N189 Chronic kidney disease, unspecified: Secondary | ICD-10-CM | POA: Diagnosis not present

## 2022-03-01 DIAGNOSIS — E785 Hyperlipidemia, unspecified: Secondary | ICD-10-CM | POA: Diagnosis not present

## 2022-03-01 DIAGNOSIS — R413 Other amnesia: Secondary | ICD-10-CM

## 2022-03-01 DIAGNOSIS — R7303 Prediabetes: Secondary | ICD-10-CM | POA: Diagnosis not present

## 2022-03-01 DIAGNOSIS — I10 Essential (primary) hypertension: Secondary | ICD-10-CM | POA: Diagnosis not present

## 2022-03-01 DIAGNOSIS — K219 Gastro-esophageal reflux disease without esophagitis: Secondary | ICD-10-CM

## 2022-03-01 MED ORDER — OMEPRAZOLE 20 MG PO CPDR: 20.0000 mg | DELAYED_RELEASE_CAPSULE | Freq: Every day | ORAL | 0 refills | Status: DC

## 2022-03-01 MED ORDER — ESOMEPRAZOLE MAGNESIUM 40 MG PO CPDR: 40.0000 mg | DELAYED_RELEASE_CAPSULE | Freq: Every day | ORAL | 0 refills | Status: DC

## 2022-03-01 NOTE — Discharge Instructions (Signed)
Stop taking alprazolam. ? ?Call your primary care office today for an appointment to discuss alternative treatments for your anxiety.  I am hoping that that appointment can happen in the next 1 to 2 weeks ? ?Take omeprazole 20 mg--1 daily for reflux ?

## 2022-03-01 NOTE — ED Triage Notes (Addendum)
Pt wife states his Dr increased his medication Alprazolam 0.'5mg'$  to '1mg'$ . Pt's wife states memory confusion on taking this medication. Pt's wife states on-going acid reflux. Pt's chart states he is allergic to Alprazolam on 10/24/2020. Pt did not take BP medication today  ?

## 2022-03-01 NOTE — ED Provider Notes (Addendum)
[facility]    CSN: [id number] Arrival date & time: 03/01/22  0807      History   Chief Complaint Chief Complaint  Patient presents with   Medication Reaction    HPI Randy Merritt is a 81 y.o. male.   HPI Here for trouble with memory and thinking in the last 2 to 3 weeks or possibly longer.  He last saw his primary care office about 2 weeks ago.  His dose of alprazolam had been increased from 0.5 to 1 mg sometime before that.  His wife gives most of the history and states that he feels his memory is worse since having that dose increased.  When speaking with the patient himself, it is difficult to tell if these mental status changes are acute or not.  He states he does not know if he has fever, because he does not have a thermometer.  Wife states he has not had fever or chills or abdominal pain.  Also no vomiting.  When asked about diarrhea, he confabulates about some medicine he is taking.  She states he has maybe been taking some MiraLAX  When asked about a thyroid nodule noted on exam, she states that has been evaluated previously without any serious pathology found  After I left the room his wife asked if he could have something for reflux.  Past Medical History:  Diagnosis Date   Acid reflux    Anxiety    Arthritis    Depression    " a little"   Goiter    High cholesterol    History of blood transfusion    Perforated bowel (HCC)    Seasonal allergies     Patient Active Problem List   Diagnosis Date Noted   Allergic rhinitis due to pollen 02/12/2022   Chronic pain 02/12/2022   Memory loss 02/12/2022   Moderate recurrent major depression (HCC) 02/12/2022   Non-toxic multinodular goiter 02/12/2022   Primary insomnia 02/12/2022   Recurrent major depression in remission (HCC) 02/12/2022   Thrombocytopenia (HCC) 02/12/2022   Chronic constipation 02/15/2021   Cervical radiculopathy 01/24/2021   History of cervical spinal arthrodesis 01/24/2021    Essential (primary) hypertension 01/16/2021   Neck pain 01/16/2021   Aphasia 07/01/2020   TIA (transient ischemic attack) 06/29/2020   Globus pharyngeus 05/20/2020   Goiter 12/24/2019   Perforated sigmoid colon (HCC) 10/06/2019   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/2019   Anxiety 04/14/2018   BPH (benign prostatic hyperplasia) 04/14/2018   CKD (chronic kidney disease) stage 3, GFR 30-59 ml/min (HCC) 04/14/2018   History of adenomatous polyp of colon 04/14/2018   Hyperlipidemia 04/14/2018   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   Ingrown toenail 10/11/2017   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   Oropharyngeal dysphagia 02/02/2017   Chronic rhinitis 02/01/2017   Gastroesophageal reflux disease 02/01/2017    Past Surgical History:  Procedure Laterality Date   ANTERIOR CERVICAL DECOMP/DISCECTOMY FUSION N/A 04/08/2013   Procedure: ANTERIOR CERVICAL DECOMPRESSION/DISCECTOMY FUSION 1 LEVEL;  Surgeon: Karn Cassis, MD;  Location: [facility];  Service: Neurosurgery;  Laterality: N/A;  Cervical five-six Anterior cervical decompression/diskectomy fusion   APPENDECTOMY N/A 10/06/2019   Procedure: APPENDECTOMY;  Surgeon: Berna Bue, MD;  Location: [facility];  Service: General;  Laterality: N/A;   BIOPSY  01/30/2019   Procedure: BIOPSY;  Surgeon: Jeani Hawking, MD;  Location: [facility];  Service: Endoscopy;;   CERVICAL DISCECTOMY     x2   CHOLECYSTECTOMY  COLON RESECTION SIGMOID N/A 10/06/2019   Procedure: COLON RESECTION SIGMOID;  Surgeon: Berna Bue, MD;  Location: [facility] OR;  Service: General;  Laterality: N/A;   ESOPHAGEAL DILATION  01/30/2019   Procedure: ESOPHAGEAL DILATION;  Surgeon: Jeani Hawking, MD;  Location: [facility];  Service: Endoscopy;;  [place]   ESOPHAGOGASTRODUODENOSCOPY (EGD) WITH PROPOFOL N/A 01/30/2019   Procedure: ESOPHAGOGASTRODUODENOSCOPY (EGD) WITH PROPOFOL;  Surgeon: Jeani Hawking, MD;  Location: [facility];  Service:  Endoscopy;  Laterality: N/A;   gallstone removal     LAPAROTOMY N/A 10/06/2019   Procedure: EXPLORATORY LAPAROTOMY;  Surgeon: Berna Bue, MD;  Location: [facility];  Service: General;  Laterality: N/A;       Home Medications    Prior to Admission medications   Medication Sig Start Date End Date Taking? Authorizing Provider  esomeprazole (NEXIUM) 40 MG capsule Take 1 capsule (40 mg total) by mouth daily. 03/01/22  Yes Tynleigh Birt, Janace Aris, MD  acetaminophen (TYLENOL) 500 MG tablet Take 2 tablets (1,000 mg total) by mouth every 8 (eight) hours. Patient taking differently: Take 1,000 mg by mouth every 8 (eight) hours as needed. pain 10/15/19   Sherrie George, PA-C  alum & mag hydroxide-simeth (MAALOX MAX) 400-400-40 MG/5ML suspension Take 5 mLs by mouth every 6 (six) hours as needed for indigestion. 08/10/21   Henderly, Britni A, PA-C  amLODipine (NORVASC) 5 MG tablet Take 5 mg by mouth daily.    [provider]  diclofenac Sodium (VOLTAREN) 1 % GEL Apply 2 g topically 4 (four) times daily. Patient taking differently: Apply 2 g topically 4 (four) times daily as needed (joint pain). 06/27/21   Particia Nearing, PA-C  DULoxetine (CYMBALTA) 30 MG capsule Take 30 mg by mouth daily. 07/07/21   [provider]  hydrOXYzine (ATARAX/VISTARIL) 25 MG tablet Take 1 tablet (25 mg total) by mouth every 6 (six) hours as needed for anxiety. 08/03/21   Alvira Monday, MD  lamoTRIgine (LAMICTAL) 25 MG tablet Take 25 mg by mouth daily. 10/17/20   [provider]  loratadine (CLARITIN) 10 MG tablet 1-2 tablets 1 time per day Patient not taking: Reported on 10/08/2020 04/05/20   Jessica Priest, MD  losartan (COZAAR) 25 MG tablet Take 25 mg by mouth daily. 05/26/21   [provider]  metoprolol succinate (TOPROL-XL) 25 MG 24 hr tablet Take 25 mg by mouth daily. 07/20/21   [provider]  mirtazapine (REMERON) 15 MG tablet Take 15 mg by mouth at bedtime. 07/18/21    [provider]  montelukast (SINGULAIR) 10 MG tablet Take 10 mg by mouth at bedtime.    [provider]  Multiple Vitamins-Iron (MULTIVITAMINS WITH IRON) TABS tablet You should get a Women's One a day vitamin with iron and take daily.  This will help with your low hemoglobin.  You can buy this at any drug store, and choose whatever brand you like. Patient taking differently: Take 1 tablet by mouth daily.  10/15/19   Sherrie George, PA-C  ondansetron (ZOFRAN ODT) 4 MG disintegrating tablet Take 1 tablet (4 mg total) by mouth every 8 (eight) hours as needed for nausea or vomiting. 07/08/21   Alvira Monday, MD  rosuvastatin (CRESTOR) 5 MG tablet Take 5 mg by mouth daily. 05/26/21   [provider]  sucralfate (CARAFATE) 1 GM/10ML suspension Take 10 mLs (1 g total) by mouth 4 (four) times daily -  with meals and at bedtime. 08/10/21   Henderly, Britni A, PA-C  timolol (TIMOPTIC)  0.5 % ophthalmic solution Place 1 drop into both eyes 2 (two) times daily. 11/23/20   [provider]    Family History Family History  Problem Relation Age of Onset   Healthy Mother    Healthy Father    Thyroid disease Neg Hx     Social History Social History   Tobacco Use   Smoking status: Former   Smokeless tobacco: Never  Building services engineer Use: Never used  Substance Use Topics   Alcohol use: No   Drug use: No     Allergies   Alprazolam, Cyclobenzaprine, and Prednisone   Review of Systems Review of Systems   Physical Exam Triage Vital Signs ED Triage Vitals  Enc Vitals Group     BP 03/01/22 0853 129/75     Pulse Rate 03/01/22 0853 85     Resp 03/01/22 0853 18     Temp 03/01/22 0853 97.6 F (36.4 C)     Temp Source 03/01/22 0853 Oral     SpO2 03/01/22 0853 96 %     Weight --      Height --      Head Circumference --      Peak Flow --      Pain Score 03/01/22 0850 0     Pain Loc --      Pain Edu? --      Excl. in GC? --    No data  found.  Updated Vital Signs BP 129/75 (BP Location: Right Arm)   Pulse 85   Temp 97.6 F (36.4 C) (Oral)   Resp 18   SpO2 96%   Visual Acuity Right Eye Distance:   Left Eye Distance:   Bilateral Distance:    Right Eye Near:   Left Eye Near:    Bilateral Near:     Physical Exam Vitals reviewed.  Constitutional:      General: He is not in acute distress.    Appearance: He is not ill-appearing, toxic-appearing or diaphoretic.  HENT:     Mouth/Throat:     Mouth: Mucous membranes are moist.     Pharynx: No oropharyngeal exudate or posterior oropharyngeal erythema.  Eyes:     Extraocular Movements: Extraocular movements intact.     Conjunctiva/sclera: Conjunctivae normal.     Pupils: Pupils are equal, round, and reactive to light.  Cardiovascular:     Rate and Rhythm: Normal rate and regular rhythm.     Heart sounds: No murmur heard. Pulmonary:     Effort: Pulmonary effort is normal.     Breath sounds: Normal breath sounds.  Abdominal:     Palpations: Abdomen is soft.     Tenderness: There is no abdominal tenderness.  Musculoskeletal:     Cervical back: Neck supple.  Lymphadenopathy:     Cervical: No cervical adenopathy.  Skin:    Coloration: Skin is not jaundiced or pale.  Neurological:     General: No focal deficit present.     Mental Status: He is alert.  Psychiatric:        Behavior: Behavior normal.     UC Treatments / Results  Labs (all labs ordered are listed, but only abnormal results are displayed) Labs Reviewed - No data to display   EKG   Radiology No results found.  Procedures Procedures (including critical care time)  Medications Ordered in UC Medications - No data to display  Initial Impression / Assessment and Plan / UC Course  I have reviewed  the triage vital signs and the nursing notes.  Pertinent labs & imaging results that were available during my care of the patient were reviewed by me and considered in my medical decision  making (see chart for details).     I have taken alprazolam off his list, and asked the wife just to stop giving it to him.  Discussed that there are alternative treatments that may be more appropriate for his age.  Also, that that should come from his primary care office.  I have asked the wife to contact his primary care provider when they arrived at home today, so that she can hopefully get him an appointment for evaluation and consideration of other treatments in the near future, like 1 to 2 weeks from now  Basic lab work done to assess for any metabolic causes of his possible change in mental status   WE could not access his veins for the labs, so cancelled. He can have that eval with his primary care office.   And at dc, wife stated omeprazole does not work for him, but the one that starts with an "e" does. Nexium rx sent instead. Final Clinical Impressions(s) / UC Diagnoses   Final diagnoses:  Memory change  Gastroesophageal reflux disease, unspecified whether esophagitis present     Discharge Instructions      Stop taking alprazolam.  Call your primary care office today for an appointment to discuss alternative treatments for your anxiety.  I am hoping that that appointment can happen in the next 1 to 2 weeks  Take omeprazole 20 mg--1 daily for reflux     ED Prescriptions     Medication Sig Dispense Auth. Provider   omeprazole (PRILOSEC) 20 MG capsule  (Status: Discontinued) Take 1 capsule (20 mg total) by mouth daily. 30 capsule Zenia Resides, MD   esomeprazole (NEXIUM) 40 MG capsule Take 1 capsule (40 mg total) by mouth daily. 15 capsule Marlinda Mike Janace Aris, MD      PDMP not reviewed this encounter.   Zenia Resides, MD 03/01/22 2130    Zenia Resides, MD 03/01/22 8657    Zenia Resides, MD 03/01/22 1009

## 2022-03-02 DIAGNOSIS — F411 Generalized anxiety disorder: Secondary | ICD-10-CM | POA: Diagnosis not present

## 2022-03-02 DIAGNOSIS — R519 Headache, unspecified: Secondary | ICD-10-CM | POA: Diagnosis not present

## 2022-03-02 DIAGNOSIS — E7800 Pure hypercholesterolemia, unspecified: Secondary | ICD-10-CM | POA: Diagnosis not present

## 2022-03-02 DIAGNOSIS — I10 Essential (primary) hypertension: Secondary | ICD-10-CM | POA: Diagnosis not present

## 2022-03-02 DIAGNOSIS — K2100 Gastro-esophageal reflux disease with esophagitis, without bleeding: Secondary | ICD-10-CM | POA: Diagnosis not present

## 2022-03-12 ENCOUNTER — Encounter: Payer: Self-pay | Admitting: Cardiology

## 2022-03-12 ENCOUNTER — Ambulatory Visit: Payer: Medicare HMO | Admitting: Cardiology

## 2022-03-12 VITALS — BP 129/73 | HR 66 | Temp 98.0°F | Resp 16 | Ht 68.0 in | Wt 164.0 lb

## 2022-03-12 DIAGNOSIS — I10 Essential (primary) hypertension: Secondary | ICD-10-CM | POA: Diagnosis not present

## 2022-03-12 DIAGNOSIS — I208 Other forms of angina pectoris: Secondary | ICD-10-CM | POA: Diagnosis not present

## 2022-03-12 NOTE — Progress Notes (Signed)
? ? ?Patient referred by Lucianne Lei, MD for chest pain ? ?Subjective:  ? ?Randy Merritt, male    DOB: 1941/02/23, 81 y.o.   MRN: [id number] ? ? ?Chief Complaint  ?Patient presents with  ? Hypertension  ? New Patient (Initial Visit)  ? ? ? ?HPI ? ?81 y.o. African American male with hypertension, hyperlipidemia ? ?Patient has had frequent ER visits recently with complaints of chest pressure and/or shortness of breat. ? ?Patient is a poor historian. He and wife report that he had had memory issues since being on Xanax for anxiety. Patient has had episodes of chest tightness in the mornings, unclear if there is any physical exertion. He has had a few ER visits in the last few weeks, ACS excluded.  ? ? ? ? ?Past Medical History:  ?Diagnosis Date  ? Acid reflux   ? Anxiety   ? Arthritis   ? Depression   ? " a little"  ? Goiter   ? High cholesterol   ? History of blood transfusion   ? Perforated bowel ([place])   ? Seasonal allergies   ? ? ? ?Past Surgical History:  ?Procedure Laterality Date  ? ANTERIOR CERVICAL DECOMP/DISCECTOMY FUSION N/A 04/08/2013  ? Procedure: ANTERIOR CERVICAL DECOMPRESSION/DISCECTOMY FUSION 1 LEVEL;  Surgeon: Floyce Stakes, MD;  Location: [facility];  Service: Neurosurgery;  Laterality: N/A;  Cervical five-six Anterior cervical decompression/diskectomy fusion  ? APPENDECTOMY N/A 10/06/2019  ? Procedure: APPENDECTOMY;  Surgeon: Clovis Riley, MD;  Location: [facility];  Service: General;  Laterality: N/A;  ? BIOPSY  01/30/2019  ? Procedure: BIOPSY;  Surgeon: Carol Ada, MD;  Location: [facility];  Service: Endoscopy;;  ? CERVICAL DISCECTOMY    ? x2  ? CHOLECYSTECTOMY    ? COLON RESECTION SIGMOID N/A 10/06/2019  ? Procedure: COLON RESECTION SIGMOID;  Surgeon: Clovis Riley, MD;  Location: [facility];  Service: General;  Laterality: N/A;  ? ESOPHAGEAL DILATION  01/30/2019  ? Procedure: ESOPHAGEAL DILATION;  Surgeon: Carol Ada, MD;  Location: [facility];  Service: Endoscopy;;  [place]  ?  ESOPHAGOGASTRODUODENOSCOPY (EGD) WITH PROPOFOL N/A 01/30/2019  ? Procedure: ESOPHAGOGASTRODUODENOSCOPY (EGD) WITH PROPOFOL;  Surgeon: Carol Ada, MD;  Location: [facility];  Service: Endoscopy;  Laterality: N/A;  ? gallstone removal    ? LAPAROTOMY N/A 10/06/2019  ? Procedure: EXPLORATORY LAPAROTOMY;  Surgeon: Clovis Riley, MD;  Location: [facility];  Service: General;  Laterality: N/A;  ? ? ? ?Social History  ? ?Tobacco Use  ?Smoking Status Former  ?Smokeless Tobacco Never  ? ? ?Social History  ? ?Substance and Sexual Activity  ?Alcohol Use No  ? ? ? ?Family History  ?Problem Relation Age of Onset  ? Healthy Mother   ? Healthy Father   ? Thyroid disease Neg Hx   ? ? ? ? ?Current Outpatient Medications:  ?  acetaminophen (TYLENOL) 500 MG tablet, Take 2 tablets (1,000 mg total) by mouth every 8 (eight) hours. (Patient taking differently: Take 1,000 mg by mouth every 8 (eight) hours as needed. pain), Disp: 30 tablet, Rfl: 0 ?  alum & mag hydroxide-simeth (MAALOX MAX) 400-400-40 MG/5ML suspension, Take 5 mLs by mouth every 6 (six) hours as needed for indigestion., Disp: 355 mL, Rfl: 0 ?  amLODipine (NORVASC) 5 MG tablet, Take 5 mg by mouth daily., Disp: , Rfl:  ?  diclofenac Sodium (VOLTAREN) 1 % GEL, Apply 2 g topically 4 (four) times daily. (Patient taking differently: Apply 2 g topically 4 (four)  times daily as needed (joint pain).), Disp: 150 g, Rfl: 2 ?  DULoxetine (CYMBALTA) 30 MG capsule, Take 30 mg by mouth daily., Disp: , Rfl:  ?  esomeprazole (NEXIUM) 40 MG capsule, Take 1 capsule (40 mg total) by mouth daily., Disp: 15 capsule, Rfl: 0 ?  hydrOXYzine (ATARAX/VISTARIL) 25 MG tablet, Take 1 tablet (25 mg total) by mouth every 6 (six) hours as needed for anxiety., Disp: 12 tablet, Rfl: 0 ?  lamoTRIgine (LAMICTAL) 25 MG tablet, Take 25 mg by mouth daily., Disp: , Rfl:  ?  loratadine (CLARITIN) 10 MG tablet, 1-2 tablets 1 time per day (Patient not taking: Reported on 10/08/2020), Disp: 60 tablet, Rfl: 5 ?   losartan (COZAAR) 25 MG tablet, Take 25 mg by mouth daily., Disp: , Rfl:  ?  metoprolol succinate (TOPROL-XL) 25 MG 24 hr tablet, Take 25 mg by mouth daily., Disp: , Rfl:  ?  mirtazapine (REMERON) 15 MG tablet, Take 15 mg by mouth at bedtime., Disp: , Rfl:  ?  montelukast (SINGULAIR) 10 MG tablet, Take 10 mg by mouth at bedtime., Disp: , Rfl:  ?  Multiple Vitamins-Iron (MULTIVITAMINS WITH IRON) TABS tablet, You should get a Women's One a day vitamin with iron and take daily.  This will help with your low hemoglobin.  You can buy this at any drug store, and choose whatever brand you like. (Patient taking differently: Take 1 tablet by mouth daily. ), Disp: , Rfl: 0 ?  ondansetron (ZOFRAN ODT) 4 MG disintegrating tablet, Take 1 tablet (4 mg total) by mouth every 8 (eight) hours as needed for nausea or vomiting., Disp: 20 tablet, Rfl: 0 ?  rosuvastatin (CRESTOR) 5 MG tablet, Take 5 mg by mouth daily., Disp: , Rfl:  ?  sucralfate (CARAFATE) 1 GM/10ML suspension, Take 10 mLs (1 g total) by mouth 4 (four) times daily -  with meals and at bedtime., Disp: 420 mL, Rfl: 0 ?  timolol (TIMOPTIC) 0.5 % ophthalmic solution, Place 1 drop into both eyes 2 (two) times daily., Disp: , Rfl:  ? ? ?Cardiovascular and other pertinent studies: ? ?Reviewed external labs and tests, independently interpreted ? ?EKG 03/12/2022: ?Sinus rhythm 63 bpm ?Borderline left atrial enlargement ? ? ?Recent labs: ?11/11/2021: ?Glucose 38, BUN/Cr 16/1.2. EGFR >60. Na/K 139/4.1. Rest of the CMP normal ?H/H 13/41. MCV 95. Platelets 159 ? ?2021-2022: ?Chol 189, TG 121, HDL 47, LDL 119 ?HbA1C 6.1% ?TSH 0.7 normal ? ? ?Review of Systems  ?Cardiovascular:  Positive for chest pain. Negative for dyspnea on exertion, leg swelling, palpitations and syncope.  ? ?   ? ? ?Vitals:  ? 03/12/22 1304  ?BP: 129/73  ?Pulse: 66  ?Resp: 16  ?Temp: 98 ?F (36.7 ?C)  ?SpO2: 98%  ? ? ? ?Body mass index is 24.94 kg/m?. Danley Danker Weights  ? 03/12/22 1304  ?Weight: 164 lb (74.4 kg)   ? ? ? ?Objective:  ? Physical Exam ?Vitals and nursing note reviewed.  ?Constitutional:   ?   General: He is not in acute distress. ?Neck:  ?   Vascular: No JVD.  ?Cardiovascular:  ?   Rate and Rhythm: Normal rate and regular rhythm.  ?   Heart sounds: Normal heart sounds. No murmur heard. ?Pulmonary:  ?   Effort: Pulmonary effort is normal.  ?   Breath sounds: Normal breath sounds. No wheezing or rales.  ?Musculoskeletal:  ?   Right lower leg: No edema.  ?   Left lower leg: No edema.  ? ? ?   ? ?  Visit diagnoses: ?  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  PCV MYOCARDIAL PERFUSION WO LEXISCAN  ?  ?2. Essential (primary) hypertension  I10 EKG 12-Lead  ?  ?  ? ?Orders Placed This Encounter  ?Procedures  ? PCV MYOCARDIAL PERFUSION WO LEXISCAN  ? EKG 12-Lead  ? PCV ECHOCARDIOGRAM COMPLETE  ? ? ?Assessment & Recommendations:  ? ? ?81 y.o. African American male with hypertension, hyperlipidemia ? ?Chest pain: ?Poor historian. Risk factors include, age, mixed hyperlipidemia. ?Recommend exercise nuclear stress test, echocardiogram. ? ?Further recommendations after above tests ? ? ?Thank you for referring the patient to [place]. Please feel free to contact with any questions. ? ? ?Nigel Mormon, MD ?Pager: [phone] ?Office: [phone] ?

## 2022-03-15 ENCOUNTER — Other Ambulatory Visit: Payer: Medicare HMO

## 2022-03-19 DIAGNOSIS — Z23 Encounter for immunization: Secondary | ICD-10-CM | POA: Diagnosis not present

## 2022-03-19 DIAGNOSIS — F331 Major depressive disorder, recurrent, moderate: Secondary | ICD-10-CM | POA: Diagnosis not present

## 2022-03-19 DIAGNOSIS — Z1159 Encounter for screening for other viral diseases: Secondary | ICD-10-CM | POA: Diagnosis not present

## 2022-03-19 DIAGNOSIS — Z79899 Other long term (current) drug therapy: Secondary | ICD-10-CM | POA: Diagnosis not present

## 2022-03-19 DIAGNOSIS — D72819 Decreased white blood cell count, unspecified: Secondary | ICD-10-CM | POA: Diagnosis not present

## 2022-03-19 DIAGNOSIS — I700 Atherosclerosis of aorta: Secondary | ICD-10-CM | POA: Diagnosis not present

## 2022-03-19 DIAGNOSIS — R413 Other amnesia: Secondary | ICD-10-CM | POA: Diagnosis not present

## 2022-03-19 DIAGNOSIS — I509 Heart failure, unspecified: Secondary | ICD-10-CM | POA: Diagnosis not present

## 2022-03-19 DIAGNOSIS — N189 Chronic kidney disease, unspecified: Secondary | ICD-10-CM | POA: Diagnosis not present

## 2022-03-19 DIAGNOSIS — Z113 Encounter for screening for infections with a predominantly sexual mode of transmission: Secondary | ICD-10-CM | POA: Diagnosis not present

## 2022-03-19 DIAGNOSIS — N1831 Chronic kidney disease, stage 3a: Secondary | ICD-10-CM | POA: Diagnosis not present

## 2022-03-20 ENCOUNTER — Ambulatory Visit: Payer: Medicare HMO

## 2022-03-20 DIAGNOSIS — I208 Other forms of angina pectoris: Secondary | ICD-10-CM | POA: Diagnosis not present

## 2022-03-26 ENCOUNTER — Ambulatory Visit: Payer: Medicare HMO

## 2022-03-26 DIAGNOSIS — I208 Other forms of angina pectoris: Secondary | ICD-10-CM | POA: Diagnosis not present

## 2022-03-29 ENCOUNTER — Ambulatory Visit (HOSPITAL_COMMUNITY)
Admission: EM | Admit: 2022-03-29 | Discharge: 2022-03-29 | Disposition: A | Discharge status: Home / Self Care | Payer: Medicare HMO | Attending: Internal Medicine | Admitting: Internal Medicine

## 2022-03-29 ENCOUNTER — Encounter (HOSPITAL_COMMUNITY): Payer: Self-pay

## 2022-03-29 DIAGNOSIS — K219 Gastro-esophageal reflux disease without esophagitis: Secondary | ICD-10-CM

## 2022-03-29 DIAGNOSIS — J301 Allergic rhinitis due to pollen: Secondary | ICD-10-CM

## 2022-03-29 MED ORDER — CETIRIZINE HCL 5 MG PO TABS: 5.0000 mg | ORAL_TABLET | Freq: Every day | ORAL | 1 refills | Status: DC

## 2022-03-29 MED ORDER — ESOMEPRAZOLE MAGNESIUM 40 MG PO CPDR: 40.0000 mg | DELAYED_RELEASE_CAPSULE | Freq: Every day | ORAL | 0 refills | Status: DC

## 2022-03-29 MED ORDER — ONDANSETRON 4 MG PO TBDP: 4.0000 mg | ORAL_TABLET | Freq: Three times a day (TID) | ORAL | 0 refills | Status: DC | PRN

## 2022-03-29 MED ORDER — DEXTROMETHORPHAN POLISTIREX ER 30 MG/5ML PO SUER: 15.0000 mg | Freq: Two times a day (BID) | ORAL | 0 refills | Status: DC

## 2022-03-29 NOTE — ED Triage Notes (Signed)
Pt is here alone. C/o coughing up mucous and stomach pain for several months. He reports taking Nexium for his stomach pain. This has not help his symptoms. Pt has some memory confusion on taking his medication.

## 2022-03-29 NOTE — Discharge Instructions (Addendum)
You were seen urgent care today for seasonal allergies and nasal congestion.  Please start taking cetirizine 5 mg daily to dry up any nasal congestion and mucus you may have.  This will also help with your cough. You may take Delsym cough syrup every 12 hours as needed for cough.  This medication will not make you sleepy.  I have prescribed Zofran for you to have for nausea as needed.  I believe her nausea is caused by a little bit of stomach irritation.  I would like for you to restart taking Nexium 40 mg in the morning daily for your nausea, bloating, and decreased appetite.  Follow-up with primary care regarding your anxiety on April 17, 2022.  If you develop any new or worsening symptoms or do not improve in the next 2 to 3 days, please return.  If your symptoms are severe, please go to the emergency room.  Follow-up with your primary care provider for further evaluation and management of your symptoms as well as ongoing wellness visits.  I hope you feel better!

## 2022-03-29 NOTE — ED Provider Notes (Signed)
[place]    CSN: [id number] Arrival date & time: 03/29/22  0803      History   Chief Complaint Chief Complaint  Patient presents with   Gastroesophageal Reflux   Abdominal Pain   Nasal Congestion    HPI Randy Merritt is a 81 y.o. male.   Patient presents to urgent care for evaluation of nausea, nasal drainage, and cough with thick mucus.  He is pleasantly confused at baseline and has a significant past medical history of TIA and memory loss.  He is at urgent care by himself today, but he lives at home with his wife.  He is also concerned about his anxiety.  He was prescribed Lexapro on Mar 22, 2022 and took this medication for 4 days but states that it made his body feel "weird".  He mentioned inability to sleep while on the medication, but was unable to elaborate further.  He takes Xanax as needed for anxiety and states that the Lexapro did not help as much as the Xanax.  He called his primary care provider office today, and they told him that his doctor would not be in the office until April 17, 2022.  He does not have any more Xanax at home.  Nausea, nasal drainage, and cough have been present for an unknown amount of time.  He cannot identify any aggravating or relieving factors for throat pain.  Denies chills, fever, vomiting, abdominal pain, and headache.    Gastroesophageal Reflux Associated symptoms include abdominal pain.  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   Non-toxic multinodular goiter 02/12/2022   Primary insomnia  02/12/2022   Recurrent major depression in remission ([place]) 02/12/2022   Thrombocytopenia ([place]) 02/12/2022   Chronic constipation 02/15/2021   Cervical radiculopathy 01/24/2021   History of cervical spinal arthrodesis 01/24/2021   Essential (primary) hypertension 01/16/2021   Neck pain 01/16/2021   Aphasia 07/01/2020   TIA (transient ischemic attack) 06/29/2020   Globus pharyngeus 05/20/2020   Goiter 12/24/2019   Perforated sigmoid colon ([place]) 10/06/2019   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/2019   Anxiety 04/14/2018   BPH (benign prostatic hyperplasia) 04/14/2018   CKD (chronic kidney disease) stage 3, GFR 30-59 ml/min ([place]) 04/14/2018   History of adenomatous polyp of colon 04/14/2018   Hyperlipidemia 04/14/2018   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   Ingrown toenail 10/11/2017   Chondrodermatitis nodularis helicis of right ear 74/25/9563   Oropharyngeal dysphagia 02/02/2017   Chronic rhinitis 02/01/2017   Gastroesophageal reflux disease 02/01/2017    Past Surgical History:  Procedure Laterality Date   ANTERIOR CERVICAL DECOMP/DISCECTOMY FUSION N/A 04/08/2013   Procedure: ANTERIOR CERVICAL DECOMPRESSION/DISCECTOMY FUSION 1 LEVEL;  Surgeon: Floyce Stakes, MD;  Location: [facility];  Service: Neurosurgery;  Laterality: N/A;  Cervical five-six Anterior cervical decompression/diskectomy fusion   APPENDECTOMY N/A 10/06/2019   Procedure: APPENDECTOMY;  Surgeon: Clovis Riley, MD;  Location: [facility];  Service: General;  Laterality: N/A;   BIOPSY  01/30/2019   Procedure: BIOPSY;  Surgeon: Carol Ada, MD;  Location: [facility];  Service: Endoscopy;;   CERVICAL DISCECTOMY     x2   CHOLECYSTECTOMY     COLON RESECTION SIGMOID N/A 10/06/2019   Procedure: COLON RESECTION SIGMOID;  Surgeon: Clovis Riley, MD;  Location: [facility];  Service: General;  Laterality: N/A;   ESOPHAGEAL DILATION  01/30/2019   Procedure: ESOPHAGEAL DILATION;  Surgeon: Carol Ada, MD;   Location: [facility];  Service: Endoscopy;;  [place]   ESOPHAGOGASTRODUODENOSCOPY (EGD) WITH PROPOFOL N/A 01/30/2019   Procedure: ESOPHAGOGASTRODUODENOSCOPY (EGD) WITH PROPOFOL;  Surgeon: Carol Ada, MD;  Location: [facility];  Service: Endoscopy;  Laterality: N/A;   gallstone removal     LAPAROTOMY N/A 10/06/2019   Procedure: EXPLORATORY LAPAROTOMY;  Surgeon: Clovis Riley, MD;  Location: [facility];  Service: General;  Laterality: N/A;       Home Medications    Prior to Admission medications   Medication Sig Start Date End Date Taking? Authorizing Provider  cetirizine (ZYRTEC) 5 MG tablet Take 1 tablet (5 mg total) by mouth daily. 03/29/22  Yes Talbot Grumbling, FNP  dextromethorphan (DELSYM) 30 MG/5ML liquid Take 2.5 mLs (15 mg total) by mouth 2 (two) times daily. 03/29/22  Yes Talbot Grumbling, FNP  esomeprazole (NEXIUM) 40 MG capsule Take 1 capsule (40 mg total) by mouth daily. 03/29/22  Yes Talbot Grumbling, FNP  ondansetron (ZOFRAN-ODT) 4 MG disintegrating tablet Take 1 tablet (4 mg total) by mouth every 8 (eight) hours as needed for nausea or vomiting. 03/29/22  Yes Talbot Grumbling, FNP  acetaminophen (TYLENOL) 500 MG tablet Take 2 tablets (1,000 mg total) by mouth every 8 (eight) hours. Patient taking differently: Take 1,000 mg by mouth every 8 (eight) hours as needed. pain 10/15/19   Earnstine Regal, PA-C  ALPRAZolam Duanne Moron) 1 MG tablet Take by mouth. 03/03/22   [provider]  metoprolol succinate (TOPROL-XL) 25 MG 24 hr tablet Take 25 mg by mouth daily. 07/20/21   [provider]  Multiple Vitamins-Iron (MULTIVITAMINS WITH IRON) TABS tablet You should get a Women's One a day vitamin with iron and take daily.  This will help with your low hemoglobin.  You can buy this at any drug store, and choose whatever brand you like. Patient taking differently: Take 1 tablet by mouth daily. 10/15/19   Earnstine Regal, PA-C  Rosuvastatin Calcium 10 MG  CPSP Take 10 mg by mouth daily. 05/26/21   [provider]  timolol (TIMOPTIC) 0.5 % ophthalmic solution Place 1 drop into both eyes 2 (two) times daily. 11/23/20   [provider]    Family History Family History  Problem Relation Age of Onset   Healthy Mother    Healthy Father    Hypertension Sister    Hypertension Sister    Diabetes Sister    Hypertension Brother    Thyroid disease Neg Hx     Social History Social History   Tobacco Use   Smoking status: Former    Packs/day: 0.25    Years: 1.00    Pack years: 0.25    Types: Cigarettes    Quit date: 1960    Years since quitting: 63.4   Smokeless tobacco: Never  Vaping Use   Vaping Use: Never used  Substance Use Topics   Alcohol use: No   Drug use: No     Allergies   Alprazolam, Cyclobenzaprine, and Prednisone   Review  of Systems Review of Systems  Gastrointestinal:  Positive for abdominal pain.  Per HPI  Physical Exam Triage Vital Signs ED Triage Vitals  Enc Vitals Group     BP 03/29/22 0831 100/63     Pulse Rate 03/29/22 0831 71     Resp 03/29/22 0831 16     Temp 03/29/22 0831 97.9 F (36.6 C)     Temp Source 03/29/22 0831 Oral     SpO2 03/29/22 0831 100 %     Weight --      Height --      Head Circumference --      Peak Flow --      Pain Score 03/29/22 0832 5     Pain Loc --      Pain Edu? --      Excl. in [place]? --    No data found.  Updated Vital Signs BP 100/63 (BP Location: Left Arm)   Pulse 71   Temp 97.9 F (36.6 C) (Oral)   Resp 16   SpO2 100%   Visual Acuity Right Eye Distance:   Left Eye Distance:   Bilateral Distance:    Right Eye Near:   Left Eye Near:    Bilateral Near:     Physical Exam Vitals and nursing note reviewed.  Constitutional:      General: He is not in acute distress.    Appearance: Normal appearance. He is well-developed. He is not ill-appearing.     Comments: Very pleasant patient sitting comfortably on exam able in no acute distress.    HENT:     Head: Normocephalic and atraumatic.     Right Ear: Tympanic membrane, ear canal and external ear normal.     Left Ear: Tympanic membrane, ear canal and external ear normal.     Nose: Congestion and rhinorrhea present. Rhinorrhea is clear.     Right Nostril: No occlusion.     Left Nostril: No occlusion.     Right Turbinates: Swollen and pale.     Left Turbinates: Swollen and pale.     Right Sinus: No maxillary sinus tenderness or frontal sinus tenderness.     Left Sinus: No maxillary sinus tenderness or frontal sinus tenderness.     Mouth/Throat:     Lips: Pink.     Mouth: Mucous membranes are moist.     Pharynx: Posterior oropharyngeal erythema present.     Tonsils: No tonsillar exudate or tonsillar abscesses.     Comments: Mild erythema to posterior oropharynx with small amount of clear postnasal drainage visualized. Airway intact and patent. Eyes:     General: Lids are normal. Vision grossly intact. Gaze aligned appropriately.     Extraocular Movements: Extraocular movements intact.     Conjunctiva/sclera: Conjunctivae normal.     Right eye: Right conjunctiva is not injected.     Left eye: Left conjunctiva is not injected.     Pupils: Pupils are equal, round, and reactive to light.  Cardiovascular:     Rate and Rhythm: Normal rate and regular rhythm.     Heart sounds: Normal heart sounds, S1 normal and S2 normal. No murmur heard.   No friction rub. No gallop.  Pulmonary:     Effort: Pulmonary effort is normal. No respiratory distress.     Breath sounds: Normal breath sounds. No decreased air movement. No wheezing, rhonchi or rales.  Chest:     Chest wall: No tenderness.  Abdominal:     General: Abdomen is flat.  Bowel sounds are normal. There is no distension.     Palpations: Abdomen is soft.     Tenderness: There is no abdominal tenderness. There is no right CVA tenderness or left CVA tenderness.  Musculoskeletal:        General: No swelling.     Cervical back:  Full passive range of motion without pain and neck supple.     Right lower leg: No edema.     Left lower leg: No edema.  Lymphadenopathy:     Cervical: No cervical adenopathy.  Skin:    General: Skin is warm and dry.     Capillary Refill: Capillary refill takes less than 2 seconds.     Findings: No rash.  Neurological:     General: No focal deficit present.     Mental Status: He is alert and oriented to person, place, and time. Mental status is at baseline. He is confused.     Cranial Nerves: Cranial nerves 2-12 are intact. No dysarthria.     Sensory: Sensation is intact.     Motor: Motor function is intact. No weakness, tremor or abnormal muscle tone.     Coordination: Coordination is intact.     Gait: Gait is intact.     Comments: Patient is confused at baseline. He is able to answer simple questions appropriately most of the time. He speaks with a stutter at baseline. No acute neurological abnormality based on review of prior visits.  Psychiatric:        Attention and Perception: Attention and perception normal.        Mood and Affect: Mood normal.        Speech: Speech normal.        Behavior: Behavior normal. Behavior is cooperative.        Thought Content: Thought content normal.        Cognition and Memory: Cognition and memory normal.        Judgment: Judgment normal.     UC Treatments / Results  Labs (all labs ordered are listed, but only abnormal results are displayed) Labs Reviewed - No data to display  EKG   Radiology No results found.  Procedures Procedures (including critical care time)  Medications Ordered in UC Medications - No data to display  Initial Impression / Assessment and Plan / UC Course  I have reviewed the triage vital signs and the nursing notes.  Pertinent labs & imaging results that were available during my care of the patient were reviewed by me and considered in my medical decision making (see chart for details).  Patient presents to  urgent care by himself with complaint of allergy symptoms as well as nausea and decreased appetite.  Difficult to obtain history due to patient's confusion at baseline.  Suspect nausea with eating and decreased appetite is related to sore throat from allergic rhinitis.  Patient also reports bloating and may have a mild gastritis process.  Plan to prescribe cetirizine 5 mg to be taken once daily.  His BUN and creatinine on 11/12/2019 20 to 4 months ago was normal at 16 and 1.20 respectively.  No dosage adjustment for cetirizine indicated at this time.  Suspect this will clear up patient's cough, nasal congestion, and throat pain.  Deferred imaging based on stable cardiopulmonary exam and vital signs today.  Lung sounds are clear and equal to auscultation.  No concern for acute pneumonia or other intrathoracic infectious process or viral process. Prescribed Delsym cough syrup every 12 hours  as needed for cough.   Patient prescribed Nexium 40 mg to be taken once daily at last visit at urgent care on March 01, 2022 where he was seen for acid reflux.  On today's medication reconciliation, patient is not taking Nexium at this time.  Would like for patient to start Nexium again as this could help his nausea and bloating symptoms as well as his decreased appetite for mild uncomplicated gastritis.  Patient instructed to follow-up with his primary care provider on June 6 when he returns to the office regarding anxiety.  Instructed to stop taking Lexapro due to feeling "weird".  Patient denies feeling shaky, tremor, headache, and dizziness.  Reports loss of sleep.  Patient to follow-up with primary care on symptoms and for further management of his anxiety and medications for mental health.  He is not in acute distress today at the clinic.  Counseled patient regarding appropriate use of medications and potential side effects for all medications recommended or prescribed today. Discussed red flag signs and symptoms of  worsening condition,when to call the PCP office, return to urgent care, and when to seek higher level of care. Patient verbalizes understanding and agreement with plan. All questions answered. Patient discharged in stable condition.   Final Clinical Impressions(s) / UC Diagnoses   Final diagnoses:  Seasonal allergic rhinitis due to pollen  Gastroesophageal reflux disease without esophagitis     Discharge Instructions      You were seen urgent care today for seasonal allergies and nasal congestion.  Please start taking cetirizine 5 mg daily to dry up any nasal congestion and mucus you may have.  This will also help with your cough. You may take Delsym cough syrup every 12 hours as needed for cough.  This medication will not make you sleepy.  I have prescribed Zofran for you to have for nausea as needed.  I believe her nausea is caused by a little bit of stomach irritation.  I would like for you to restart taking Nexium 40 mg in the morning daily for your nausea, bloating, and decreased appetite.  Follow-up with primary care regarding your anxiety on April 17, 2022.  If you develop any new or worsening symptoms or do not improve in the next 2 to 3 days, please return.  If your symptoms are severe, please go to the emergency room.  Follow-up with your primary care provider for further evaluation and management of your symptoms as well as ongoing wellness visits.  I hope you feel better!     ED Prescriptions     Medication Sig Dispense Auth. Provider   esomeprazole (NEXIUM) 40 MG capsule Take 1 capsule (40 mg total) by mouth daily. 30 capsule Talbot Grumbling, FNP   cetirizine (ZYRTEC) 5 MG tablet Take 1 tablet (5 mg total) by mouth daily. 30 tablet Talbot Grumbling, FNP   dextromethorphan (DELSYM) 30 MG/5ML liquid Take 2.5 mLs (15 mg total) by mouth 2 (two) times daily. 89 mL Joella Prince M, FNP   ondansetron (ZOFRAN-ODT) 4 MG disintegrating tablet Take 1 tablet (4 mg total)  by mouth every 8 (eight) hours as needed for nausea or vomiting. 20 tablet Talbot Grumbling, FNP      PDMP not reviewed this encounter.   Talbot Grumbling, [place] 03/29/22 1018

## 2022-03-30 ENCOUNTER — Emergency Department (HOSPITAL_COMMUNITY): Payer: Medicare HMO

## 2022-03-30 ENCOUNTER — Emergency Department (HOSPITAL_COMMUNITY)
Admission: EM | Admit: 2022-03-30 | Discharge: 2022-03-30 | Disposition: A | Discharge status: Home / Self Care | Payer: Medicare HMO | Attending: Student | Admitting: Student

## 2022-03-30 ENCOUNTER — Other Ambulatory Visit: Payer: Self-pay

## 2022-03-30 ENCOUNTER — Encounter (HOSPITAL_COMMUNITY): Payer: Self-pay

## 2022-03-30 DIAGNOSIS — E041 Nontoxic single thyroid nodule: Secondary | ICD-10-CM

## 2022-03-30 DIAGNOSIS — K117 Disturbances of salivary secretion: Secondary | ICD-10-CM

## 2022-03-30 DIAGNOSIS — Z87891 Personal history of nicotine dependence: Secondary | ICD-10-CM | POA: Insufficient documentation

## 2022-03-30 DIAGNOSIS — R059 Cough, unspecified: Secondary | ICD-10-CM | POA: Insufficient documentation

## 2022-03-30 DIAGNOSIS — R0602 Shortness of breath: Secondary | ICD-10-CM | POA: Diagnosis not present

## 2022-03-30 DIAGNOSIS — I129 Hypertensive chronic kidney disease with stage 1 through stage 4 chronic kidney disease, or unspecified chronic kidney disease: Secondary | ICD-10-CM | POA: Insufficient documentation

## 2022-03-30 DIAGNOSIS — N183 Chronic kidney disease, stage 3 unspecified: Secondary | ICD-10-CM | POA: Diagnosis not present

## 2022-03-30 DIAGNOSIS — Z79899 Other long term (current) drug therapy: Secondary | ICD-10-CM | POA: Insufficient documentation

## 2022-03-30 DIAGNOSIS — E049 Nontoxic goiter, unspecified: Secondary | ICD-10-CM | POA: Diagnosis not present

## 2022-03-30 LAB — CBC WITH DIFFERENTIAL/PLATELET
Abs Immature Granulocytes: 0.01 10*3/uL (ref 0.00–0.07)
Basophils Absolute: 0 10*3/uL (ref 0.0–0.1)
Basophils Relative: 0 %
Eosinophils Absolute: 0 10*3/uL (ref 0.0–0.5)
Eosinophils Relative: 1 %
HCT: 42.4 % (ref 39.0–52.0)
Hemoglobin: 14.6 g/dL (ref 13.0–17.0)
Immature Granulocytes: 0 %
Lymphocytes Relative: 39 %
Lymphs Abs: 1.6 10*3/uL (ref 0.7–4.0)
MCH: 32.7 pg (ref 26.0–34.0)
MCHC: 34.4 g/dL (ref 30.0–36.0)
MCV: 95.1 fL (ref 80.0–100.0)
Monocytes Absolute: 0.3 10*3/uL (ref 0.1–1.0)
Monocytes Relative: 8 %
Neutro Abs: 2.2 10*3/uL (ref 1.7–7.7)
Neutrophils Relative %: 52 %
Platelets: 178 10*3/uL (ref 150–400)
RBC: 4.46 MIL/uL (ref 4.22–5.81)
RDW: 12.4 % (ref 11.5–15.5)
WBC: 4.2 10*3/uL (ref 4.0–10.5)
nRBC: 0 % (ref 0.0–0.2)

## 2022-03-30 LAB — COMPREHENSIVE METABOLIC PANEL
ALT: 25 U/L (ref 0–44)
AST: 25 U/L (ref 15–41)
Albumin: 4.6 g/dL (ref 3.5–5.0)
Alkaline Phosphatase: 109 U/L (ref 38–126)
Anion gap: 7 (ref 5–15)
BUN: 14 mg/dL (ref 8–23)
CO2: 28 mmol/L (ref 22–32)
Calcium: 9.6 mg/dL (ref 8.9–10.3)
Chloride: 105 mmol/L (ref 98–111)
Creatinine, Ser: 1.5 mg/dL — ABNORMAL HIGH (ref 0.61–1.24)
GFR, Estimated: 46 mL/min — ABNORMAL LOW (ref >60)
Glucose, Bld: 129 mg/dL — ABNORMAL HIGH (ref 70–99)
Potassium: 3.8 mmol/L (ref 3.5–5.1)
Sodium: 140 mmol/L (ref 135–145)
Total Bilirubin: 1.1 mg/dL (ref 0.3–1.2)
Total Protein: 7.6 g/dL (ref 6.5–8.1)

## 2022-03-30 LAB — TSH: TSH: 0.404 u[IU]/mL (ref 0.350–4.500)

## 2022-03-30 MED ORDER — IOHEXOL 350 MG/ML SOLN
75.0000 mL | Freq: Once | INTRAVENOUS | Status: AC | PRN
  Administered 2022-03-30: 75 mL via INTRAVENOUS

## 2022-03-30 MED ORDER — FAMOTIDINE 20 MG PO TABS: 20.0000 mg | ORAL_TABLET | Freq: Two times a day (BID) | ORAL | 0 refills | Status: DC

## 2022-03-30 MED ORDER — CETIRIZINE HCL 5 MG PO TABS: 5.0000 mg | ORAL_TABLET | Freq: Two times a day (BID) | ORAL | 1 refills | Status: DC

## 2022-03-30 MED ORDER — SODIUM CHLORIDE (PF) 0.9 % IJ SOLN
INTRAMUSCULAR | Status: AC
  Filled 2022-03-30: qty 50

## 2022-03-30 NOTE — ED Triage Notes (Signed)
Pt states that he has been coughing up white mucus for a while now. He reports waking up and having SHOB this morning.

## 2022-03-30 NOTE — Discharge Instructions (Addendum)
Randy Merritt was seen in the emergency department for evaluation of coughing large amounts of spit in the mornings.  His work-up is largely unremarkable here in the emergency department but he does have a large thyroid that would likely need a biopsy.  His symptoms are likely due to a decreased ability to clear his oral secretions and I am worried that this may be related to his swallowing issues.  Please call your primary care physician as soon as possible to set up an outpatient swallow study.  In the meantime, we will attempt to control his reflux with Pepcid and will increase the Zyrtec to twice daily instead of once daily.  Return the emergency department if he has any choking episodes, difficulty breathing, persistent vomiting or any other concerning symptoms.

## 2022-03-30 NOTE — ED Notes (Signed)
Save blue tube in main lab °

## 2022-03-30 NOTE — ED Provider Notes (Signed)
[facility] DEPT Provider Note  CSN: [id number] Arrival date & time: 03/30/22 0557  Chief Complaint(s) Shortness of Breath and Cough  HPI Randy Merritt is a 81 y.o. male with PMH acid reflux, goiter, memory loss, depression, cervical stenosis status post surgery, chronic swallowing problems who presents the emergency department for evaluation of a persistent cough.  History obtained from patient and patient's wife who states that the patient has been seen multiple times for his current complaints at various urgent cares, and the primary concern is that the patient wakes up in the morning and coughs up a large amount of spit.  The patient denies chest pain, abdominal pain, nausea, vomiting or other systemic symptoms.  Patient does endorse some chest tightness.   Past Medical History Past Medical History:  Diagnosis Date   Acid reflux    Anxiety    Arthritis    Depression    " a little"   Goiter    High cholesterol    History of blood transfusion    Perforated bowel (HCC)    Seasonal allergies    Patient Active Problem List   Diagnosis Date Noted   Atypical angina ([place]) 03/12/2022   Allergic rhinitis due to pollen 02/12/2022   Chronic pain 02/12/2022   Memory loss 02/12/2022   Moderate recurrent major depression ([place]) 02/12/2022   Non-toxic multinodular goiter 02/12/2022   Primary insomnia 02/12/2022   Recurrent major depression in remission ([place]) 02/12/2022   Thrombocytopenia ([place]) 02/12/2022   Chronic constipation 02/15/2021   Cervical radiculopathy 01/24/2021   History of cervical spinal arthrodesis 01/24/2021   Essential (primary) hypertension 01/16/2021   Neck pain 01/16/2021   Aphasia 07/01/2020   TIA (transient ischemic attack) 06/29/2020   Globus pharyngeus 05/20/2020   Goiter 12/24/2019   Perforated sigmoid colon ([place]) 10/06/2019   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/2019   Anxiety 04/14/2018   BPH (benign prostatic  hyperplasia) 04/14/2018   CKD (chronic kidney disease) stage 3, GFR 30-59 ml/min ([place]) 04/14/2018   History of adenomatous polyp of colon 04/14/2018   Hyperlipidemia 04/14/2018   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   Ingrown toenail 10/11/2017   Chondrodermatitis nodularis helicis of right ear 00/76/2263   Oropharyngeal dysphagia 02/02/2017   Chronic rhinitis 02/01/2017   Gastroesophageal reflux disease 02/01/2017   Home Medication(s) Prior to Admission medications   Medication Sig Start Date End Date Taking? Authorizing Provider  acetaminophen (TYLENOL) 500 MG tablet Take 2 tablets (1,000 mg total) by mouth every 8 (eight) hours. Patient taking differently: Take 1,000 mg by mouth every 8 (eight) hours as needed. pain 10/15/19   Earnstine Regal, PA-C  ALPRAZolam Duanne Moron) 1 MG tablet Take by mouth. 03/03/22   [provider]  cetirizine (ZYRTEC) 5 MG tablet Take 1 tablet (5 mg total) by mouth daily. 03/29/22   Talbot Grumbling, FNP  dextromethorphan (DELSYM) 30 MG/5ML liquid Take 2.5 mLs (15 mg total) by mouth 2 (two) times daily. 03/29/22   Talbot Grumbling, FNP  esomeprazole (NEXIUM) 40 MG capsule Take 1 capsule (40 mg total) by mouth daily. 03/29/22   Talbot Grumbling, FNP  metoprolol succinate (TOPROL-XL) 25 MG 24 hr tablet Take 25 mg by mouth daily. 07/20/21   [provider]  Multiple Vitamins-Iron (MULTIVITAMINS WITH IRON) TABS tablet You should get a Women's One a day vitamin with iron and take daily.  This will help with your low hemoglobin.  You can buy this at any drug store, and choose whatever brand  you like. Patient taking differently: Take 1 tablet by mouth daily. 10/15/19   Earnstine Regal, PA-C  ondansetron (ZOFRAN-ODT) 4 MG disintegrating tablet Take 1 tablet (4 mg total) by mouth every 8 (eight) hours as needed for nausea or vomiting. 03/29/22   Talbot Grumbling, FNP  Rosuvastatin Calcium 10 MG CPSP Take 10 mg by mouth daily. 05/26/21    [provider]  timolol (TIMOPTIC) 0.5 % ophthalmic solution Place 1 drop into both eyes 2 (two) times daily. 11/23/20   [provider]                                                                                                                                    Past Surgical History Past Surgical History:  Procedure Laterality Date   ANTERIOR CERVICAL DECOMP/DISCECTOMY FUSION N/A 04/08/2013   Procedure: ANTERIOR CERVICAL DECOMPRESSION/DISCECTOMY FUSION 1 LEVEL;  Surgeon: Floyce Stakes, MD;  Location: [facility];  Service: Neurosurgery;  Laterality: N/A;  Cervical five-six Anterior cervical decompression/diskectomy fusion   APPENDECTOMY N/A 10/06/2019   Procedure: APPENDECTOMY;  Surgeon: Clovis Riley, MD;  Location: [facility];  Service: General;  Laterality: N/A;   BIOPSY  01/30/2019   Procedure: BIOPSY;  Surgeon: Carol Ada, MD;  Location: [facility];  Service: Endoscopy;;   CERVICAL DISCECTOMY     x2   CHOLECYSTECTOMY     COLON RESECTION SIGMOID N/A 10/06/2019   Procedure: COLON RESECTION SIGMOID;  Surgeon: Clovis Riley, MD;  Location: [facility];  Service: General;  Laterality: N/A;   ESOPHAGEAL DILATION  01/30/2019   Procedure: ESOPHAGEAL DILATION;  Surgeon: Carol Ada, MD;  Location: [facility];  Service: Endoscopy;;  [place]   ESOPHAGOGASTRODUODENOSCOPY (EGD) WITH PROPOFOL N/A 01/30/2019   Procedure: ESOPHAGOGASTRODUODENOSCOPY (EGD) WITH PROPOFOL;  Surgeon: Carol Ada, MD;  Location: [facility];  Service: Endoscopy;  Laterality: N/A;   gallstone removal     LAPAROTOMY N/A 10/06/2019   Procedure: EXPLORATORY LAPAROTOMY;  Surgeon: Clovis Riley, MD;  Location: [facility];  Service: General;  Laterality: N/A;   Family History Family History  Problem Relation Age of Onset   Healthy Mother    Healthy Father    Hypertension Sister    Hypertension Sister    Diabetes Sister    Hypertension Brother    Thyroid disease Neg Hx     Social  History Social History   Tobacco Use   Smoking status: Former    Packs/day: 0.25    Years: 1.00    Pack years: 0.25    Types: Cigarettes    Quit date: 1960    Years since quitting: 63.4   Smokeless tobacco: Never  Vaping Use   Vaping Use: Never used  Substance Use Topics   Alcohol use: No   Drug use: No   Allergies Alprazolam, Cyclobenzaprine, and Prednisone  Review of Systems Review of Systems  Respiratory:  Positive for cough.  Physical Exam Vital Signs  I have reviewed the triage vital signs BP (!) 148/88   Pulse 62   Temp 98.1 F (36.7 C) (Oral)   Resp (!) 22   SpO2 100%   Physical Exam Constitutional:      General: He is not in acute distress.    Appearance: Normal appearance.  HENT:     Head: Normocephalic and atraumatic.     Nose: No congestion or rhinorrhea.  Eyes:     General:        Right eye: No discharge.        Left eye: No discharge.     Extraocular Movements: Extraocular movements intact.     Pupils: Pupils are equal, round, and reactive to light.  Neck:     Comments: Large palpable thyroid Cardiovascular:     Rate and Rhythm: Normal rate and regular rhythm.     Heart sounds: No murmur heard. Pulmonary:     Effort: No respiratory distress.     Breath sounds: No wheezing or rales.  Abdominal:     General: There is no distension.     Tenderness: There is no abdominal tenderness.  Musculoskeletal:        General: Normal range of motion.     Cervical back: Normal range of motion.  Skin:    General: Skin is warm and dry.  Neurological:     General: No focal deficit present.     Mental Status: He is alert.    ED Results and Treatments Labs (all labs ordered are listed, but only abnormal results are displayed) Labs Reviewed  COMPREHENSIVE METABOLIC PANEL - Abnormal; Notable for the following components:      Result Value   Glucose, Bld 129 (*)    Creatinine, Ser 1.50 (*)    GFR, Estimated 46 (*)    All other components within  normal limits  CBC WITH DIFFERENTIAL/PLATELET  TSH                                                                                                                          Radiology CT Angio Chest PE W and/or Wo Contrast  Result Date: 03/30/2022 CLINICAL DATA:  Shortness of breath.  Productive cough. EXAM: CT ANGIOGRAPHY CHEST WITH CONTRAST TECHNIQUE: Multidetector CT imaging of the chest was performed using the standard protocol during bolus administration of intravenous contrast. Multiplanar CT image reconstructions and MIPs were obtained to evaluate the vascular anatomy. RADIATION DOSE REDUCTION: This exam was performed according to the departmental dose-optimization program which includes automated exposure control, adjustment of the mA and/or kV according to patient size and/or use of iterative reconstruction technique. CONTRAST:  7m OMNIPAQUE IOHEXOL 350 MG/ML SOLN COMPARISON:  Chest x-ray from same day. CT chest dated November 03, 2010. FINDINGS: Cardiovascular: Satisfactory opacification of the pulmonary arteries to the segmental level. No evidence of pulmonary embolism. Normal heart size. No pericardial effusion. No thoracic aortic aneurysm or dissection. Mediastinum/Nodes: No enlarged mediastinal,  hilar, or axillary lymph nodes. Unchanged enlarged multinodular thyroid goiter with the right thyroid lobe larger than the left. This has been evaluated on previous imaging. Trachea and esophagus demonstrate no significant findings. Lungs/Pleura: No focal consolidation, pleural effusion, or pneumothorax. Upper Abdomen: No acute abnormality. Musculoskeletal: No chest wall abnormality. No acute or significant osseous findings. Review of the MIP images confirms the above findings. IMPRESSION: 1. No evidence of pulmonary embolism. No acute intrathoracic process. Electronically Signed   By: Titus Dubin M.D.   On: 03/30/2022 12:50   DG Chest Portable 1 View  Result Date: 03/30/2022 CLINICAL DATA:   81 year old male with productive cough and shortness of breath. EXAM: PORTABLE CHEST 1 VIEW COMPARISON:  Portable chest 11/11/2021 and earlier. FINDINGS: Portable AP upright view at 0613 hours. Lower lung volumes. Mediastinal contours are stable. Heart size at the upper limits of normal. Visualized tracheal air column is within normal limits. Allowing for portable technique the lungs are clear. No pneumothorax or pleural effusion. No acute osseous abnormality identified. Paucity of bowel gas in the upper abdomen. IMPRESSION: Lower lung volumes.  No acute cardiopulmonary abnormality. Electronically Signed   By: Genevie Ann M.D.   On: 03/30/2022 06:24   US THYROID  Result Date: 03/30/2022 CLINICAL DATA:  Goiter. EXAM: THYROID ULTRASOUND TECHNIQUE: Ultrasound examination of the thyroid gland and adjacent soft tissues was performed. COMPARISON:  None Available. FINDINGS: Parenchymal Echotexture: Mildly heterogenous Isthmus: 0.5 cm Right lobe: 6.1 x 3.5 x 4.9 cm Left lobe:  5.6 x 2.5 x 2.7 cm _________________________________________________________ Estimated total number of nodules >/= 1 cm: 4 Number of spongiform nodules >/=  2 cm not described below (TR1): 0 Number of mixed cystic and solid nodules >/= 1.5 cm not described below (TR2): 0 _________________________________________________________ Nodule # 1: Location: Right; Mid Maximum size: 4.2 cm; Other 2 dimensions: 2.6 x 3.0 cm Composition: solid/almost completely solid (2) Echogenicity: isoechoic (1) Shape: not taller-than-wide (0) Margins: ill-defined (0) Echogenic foci: none (0) ACR TI-RADS total points: 3. ACR TI-RADS risk category: TR3 (3 points). ACR TI-RADS recommendations: **Given size (>/= 2.5 cm) and appearance, fine needle aspiration of this mildly suspicious nodule should be considered based on TI-RADS criteria. _________________________________________________________ Nodule # 2: Location: Right; Inferior Maximum size: 2.0 cm; Other 2 dimensions: 1.9 x  2.4 cm Composition: solid/almost completely solid (2) Echogenicity: isoechoic (1) Shape: not taller-than-wide (0) Margins: ill-defined (0) Echogenic foci: none (0) ACR TI-RADS total points: 3. ACR TI-RADS risk category: TR3 (3 points). ACR TI-RADS recommendations: *Given size (>/= 1.5 - 2.4 cm) and appearance, a follow-up ultrasound in 1 year should be considered based on TI-RADS criteria. _________________________________________________________ Nodule # 1 LEFT: Location: Left; Mid Maximum size: 2.7 cm; Other 2 dimensions: 2.1 x 2.1 cm Composition: solid/almost completely solid (2) Echogenicity: hypoechoic (2) Shape: not taller-than-wide (0) Margins: smooth (0) Echogenic foci: none (0) ACR TI-RADS total points: 4. ACR TI-RADS risk category: TR4 (4-6 points). ACR TI-RADS recommendations: **Given size (>/= 1.5 cm) and appearance, fine needle aspiration of this moderately suspicious nodule should be considered based on TI-RADS criteria. _________________________________________________________ Nodule # 2 LEFT: Location: Left; Inferior Maximum size: 1.4 cm; Other 2 dimensions: 1.2 x 1.3 cm Composition: solid/almost completely solid (2) Echogenicity: hypoechoic (2) Shape: not taller-than-wide (0) Margins: smooth (0) Echogenic foci: none (0) ACR TI-RADS total points: 4. ACR TI-RADS risk category: TR4 (4-6 points). ACR TI-RADS recommendations: *Given size (>/= 1 - 1.4 cm) and appearance, a follow-up ultrasound in 1 year should be considered based on TI-RADS  criteria. _________________________________________________________ IMPRESSION: 1. Diffusely enlarged heterogeneous and multinodular thyroid gland. 2. Nodules in the right mid and left mid gland both meet criteria to consider fine-needle aspiration biopsy. 3. Nodules the right inferior and left inferior gland both meet criteria for imaging surveillance. Recommend follow-up ultrasound in 1 year. The above is in keeping with the ACR TI-RADS recommendations - J Am Coll  Radiol 2017;14:587-595. Electronically Signed   By: Jacqulynn Cadet M.D.   On: 03/30/2022 13:20    Pertinent labs & imaging results that were available during my care of the patient were reviewed by me and considered in my medical decision making (see MDM for details).  Medications Ordered in ED Medications  sodium chloride (PF) 0.9 % injection (has no administration in time range)  iohexol (OMNIPAQUE) 350 MG/ML injection 75 mL (75 mLs Intravenous Contrast Given 03/30/22 1226)                                                                                                                                     Procedures Procedures  (including critical care time)  Medical Decision Making / ED Course   This patient presents to the ED for concern of cough, increased oral secretions, this involves an extensive number of treatment options, and is a complaint that carries with it a high risk of complications and morbidity.  The differential diagnosis includes chronic dysphagia and worsening swallowing leading to aspiration of oral secretions, bronchiectasis, pneumonia, PE, compression from goiter  MDM: Patient seen emergency room for evaluation of multiple complaints described above.  Physical exam reveals a large palpable goiter on the neck that is nontender to palpation, but is otherwise unremarkable.  Laboratory valuation with a creatinine of 1.5 which is near the patient's baseline.  Patient's TSH is normal.  Chest x-ray unremarkable.  CT PE unremarkable.  Ultrasound of the thyroid reveals a large multinodular goiter with some nodules that will likely require FNA.  I counseled the patient's ENT who states that the patient is safe for outpatient follow-up for his FNA.  Patient does have a history of acid reflux and it is possible that some of his nighttime increased oral secretions may be coming from uncontrolled reflux and thus we will add Pepcid to his regimen.  Patient's presentation appears  consistent with impaired swallowing muscle coordination leading to aspiration of his secretions at night.  He will require outpatient swallow study as he is currently able to tolerate p.o. and does not meet inpatient criteria for admission.  Patient and his wife state they have a good relationship with her new primary care physician who will help establish this swallow study.  In an attempt to decrease the amount of secretions at night, I instructed the patient to take an additional dose of cetirizine prior to sleeping.  Patient then discharged with outpatient follow-up.   Additional history obtained: -Additional history obtained from wife -External records from outside source  obtained and reviewed including: Chart review including previous notes, labs, imaging, consultation notes   Lab Tests: -I ordered, reviewed, and interpreted labs.   The pertinent results include:   Labs Reviewed  COMPREHENSIVE METABOLIC PANEL - Abnormal; Notable for the following components:      Result Value   Glucose, Bld 129 (*)    Creatinine, Ser 1.50 (*)    GFR, Estimated 46 (*)    All other components within normal limits  CBC WITH DIFFERENTIAL/PLATELET  TSH      EKG   EKG Interpretation  Date/Time:  Friday Mar 30 2022 06:08:09 EDT Ventricular Rate:  79 PR Interval:  147 QRS Duration: 78 QT Interval:  370 QTC Calculation: 425 R Axis:   77 Text Interpretation: Sinus rhythm Right atrial enlargement Minimal ST depression, inferior leads Confirmed by Veryl Speak [phone]) on 03/30/2022 6:21:34 AM         Imaging Studies ordered: I ordered imaging studies including CT PE, ultrasound thyroid, chest x-ray I independently visualized and interpreted imaging. I agree with the radiologist interpretation   Medicines ordered and prescription drug management: Meds ordered this encounter  Medications   iohexol (OMNIPAQUE) 350 MG/ML injection 75 mL   sodium chloride (PF) 0.9 % injection    Leav, [place]:  cabinet override    -I have reviewed the patients home medicines and have made adjustments as needed  Critical interventions none  Consultations Obtained: I requested consultation with the ENT,  and discussed lab and imaging findings as well as pertinent plan - they recommend: Outpatient follow-up for FNA   Cardiac Monitoring: The patient was maintained on a cardiac monitor.  I personally viewed and interpreted the cardiac monitored which showed an underlying rhythm of: NSR  Social Determinants of Health:  Factors impacting patients care include: none   Reevaluation: After the interventions noted above, I reevaluated the patient and found that they have :stayed the same  Co morbidities that complicate the patient evaluation  Past Medical History:  Diagnosis Date   Acid reflux    Anxiety    Arthritis    Depression    " a little"   Goiter    High cholesterol    History of blood transfusion    Perforated bowel ([place])    Seasonal allergies       Dispostion: I considered admission for this patient, but he currently does not meet inpatient criteria for admission is safe for outpatient follow-up     Final Clinical Impression(s) / ED Diagnoses Final diagnoses:  None     '@PCDICTATION'$ @    Teressa Lower, MD 03/30/22 1635

## 2022-04-02 DIAGNOSIS — R1310 Dysphagia, unspecified: Secondary | ICD-10-CM | POA: Diagnosis not present

## 2022-04-02 DIAGNOSIS — F419 Anxiety disorder, unspecified: Secondary | ICD-10-CM | POA: Diagnosis not present

## 2022-04-02 DIAGNOSIS — N400 Enlarged prostate without lower urinary tract symptoms: Secondary | ICD-10-CM | POA: Diagnosis not present

## 2022-04-02 DIAGNOSIS — E049 Nontoxic goiter, unspecified: Secondary | ICD-10-CM | POA: Diagnosis not present

## 2022-04-13 ENCOUNTER — Ambulatory Visit: Payer: Medicare HMO | Admitting: Cardiology

## 2022-04-19 ENCOUNTER — Ambulatory Visit: Payer: Medicare HMO | Admitting: Cardiology

## 2022-04-19 NOTE — Progress Notes (Deleted)
Patient referred by Lucianne Lei, MD for chest pain  Subjective:   Randy Merritt, male    DOB: 1941-01-25, 81 y.o.   MRN: [id number]   No chief complaint on file.    HPI  81 y.o. African American male with hypertension, hyperlipidemia  Patient has had frequent ER visits recently with complaints of chest pressure and/or shortness of breat.  Patient is a poor historian. He and wife report that he had had memory issues since being on Xanax for anxiety. Patient has had episodes of chest tightness in the mornings, unclear if there is any physical exertion. He has had a few ER visits in the last few weeks, ACS excluded.       Current Outpatient Medications:    acetaminophen (TYLENOL) 500 MG tablet, Take 2 tablets (1,000 mg total) by mouth every 8 (eight) hours. (Patient taking differently: Take 1,000 mg by mouth every 8 (eight) hours as needed. pain), Disp: 30 tablet, Rfl: 0   ALPRAZolam (XANAX) 1 MG tablet, Take by mouth., Disp: , Rfl:    cetirizine (ZYRTEC) 5 MG tablet, Take 1 tablet (5 mg total) by mouth in the morning and at bedtime., Disp: 30 tablet, Rfl: 1   dextromethorphan (DELSYM) 30 MG/5ML liquid, Take 2.5 mLs (15 mg total) by mouth 2 (two) times daily., Disp: 89 mL, Rfl: 0   esomeprazole (NEXIUM) 40 MG capsule, Take 1 capsule (40 mg total) by mouth daily., Disp: 30 capsule, Rfl: 0   famotidine (PEPCID) 20 MG tablet, Take 1 tablet (20 mg total) by mouth 2 (two) times daily., Disp: 30 tablet, Rfl: 0   metoprolol succinate (TOPROL-XL) 25 MG 24 hr tablet, Take 25 mg by mouth daily., Disp: , Rfl:    Multiple Vitamins-Iron (MULTIVITAMINS WITH IRON) TABS tablet, You should get a Women's One a day vitamin with iron and take daily.  This will help with your low hemoglobin.  You can buy this at any drug store, and choose whatever brand you like. (Patient taking differently: Take 1 tablet by mouth daily.), Disp: , Rfl: 0   ondansetron (ZOFRAN-ODT) 4 MG disintegrating tablet, Take 1  tablet (4 mg total) by mouth every 8 (eight) hours as needed for nausea or vomiting., Disp: 20 tablet, Rfl: 0   Rosuvastatin Calcium 10 MG CPSP, Take 10 mg by mouth daily., Disp: , Rfl:    timolol (TIMOPTIC) 0.5 % ophthalmic solution, Place 1 drop into both eyes 2 (two) times daily., Disp: , Rfl:    Cardiovascular and other pertinent studies:  Reviewed external labs and tests, independently interpreted  Lexiscan (Walking with mod Bruce)Tetrofosmin Stress Test 03/26/2022: 1 Day Rest/Stress Protocol. Exercise time 4 minutes, achieved 2.28 METS, 71% of age-predicted maximum heart rate. Stress ECG nondiagnostic due to submaximal workload. Normal myocardial perfusion without evidence of reversible myocardial ischemia or prior infarct. Calculated LVEF 61%, left ventricular size and wall motion preserved. Low risk study.  Echocardiogram 03/20/2022:  Left ventricle cavity is normal in size and wall thickness. Normal global  wall motion. Normal LV systolic function with EF 56%. Doppler evidence of  grade I (impaired) diastolic dysfunction, normal LAP.  Structurally normal trileaflet aortic valve.  Mild (Grade I) aortic  regurgitation.  Mild to moderate mitral regurgitation.  Moderate tricuspid regurgitation.  Mild pulmonic regurgitation.  No evidence of pulmonary hypertension.  EKG 03/12/2022: Sinus rhythm 63 bpm Borderline left atrial enlargement   Recent labs: 11/11/2021: Glucose 38, BUN/Cr 16/1.2. EGFR >60. Na/K 139/4.1. Rest of the CMP normal H/H  13/41. MCV 95. Platelets 159  2021-2022: Chol 189, TG 121, HDL 47, LDL 119 HbA1C 6.1% TSH 0.7 normal   Review of Systems  Cardiovascular:  Positive for chest pain. Negative for dyspnea on exertion, leg swelling, palpitations and syncope.         There were no vitals filed for this visit.    There is no height or weight on file to calculate BMI. There were no vitals filed for this visit.    Objective:   Physical Exam Vitals  and nursing note reviewed.  Constitutional:      General: He is not in acute distress. Neck:     Vascular: No JVD.  Cardiovascular:     Rate and Rhythm: Normal rate and regular rhythm.     Heart sounds: Normal heart sounds. No murmur heard. Pulmonary:     Effort: Pulmonary effort is normal.     Breath sounds: Normal breath sounds. No wheezing or rales.  Musculoskeletal:     Right lower leg: No edema.     Left lower leg: No edema.         Visit diagnoses: No diagnosis found.    No orders of the defined types were placed in this encounter.   Assessment & Recommendations:    81 y.o. African American male with hypertension, hyperlipidemia  Chest pain: Poor historian. Risk factors include, age, mixed hyperlipidemia. Recommend exercise nuclear stress test, echocardiogram.  Further recommendations after above tests   Thank you for referring the patient to [place]. Please feel free to contact with any questions.   Nigel Mormon, MD Pager: [phone] Office: [phone]

## 2022-06-18 ENCOUNTER — Ambulatory Visit: Payer: Self-pay

## 2022-06-18 NOTE — Patient Outreach (Signed)
  Care Coordination   06/18/2022 Name: Randy Merritt MRN: [id number] DOB: [birth year]   Care Coordination Outreach Attempts:  An unsuccessful telephone outreach was attempted today to offer the patient information about available care coordination services as a benefit of their health plan.   Follow Up Plan:  Additional outreach attempts will be made to offer the patient care coordination information and services.   Encounter Outcome:  No Answer  Care Coordination Interventions Activated:  No   Care Coordination Interventions:  No, not indicated    Daneen Schick, BSW, CDP Social Worker, Certified Dementia Practitioner Care Coordination [phone]

## 2022-07-02 IMAGING — DX DG CHEST 1V PORT
1 series · 1 of 1 positions shown · non-contrast
Comparison: Chest radiograph 07/26/2021

CLINICAL DATA: Shortness of breath, left arm tingling

EXAM:
PORTABLE CHEST 1 VIEW

[chest ap]
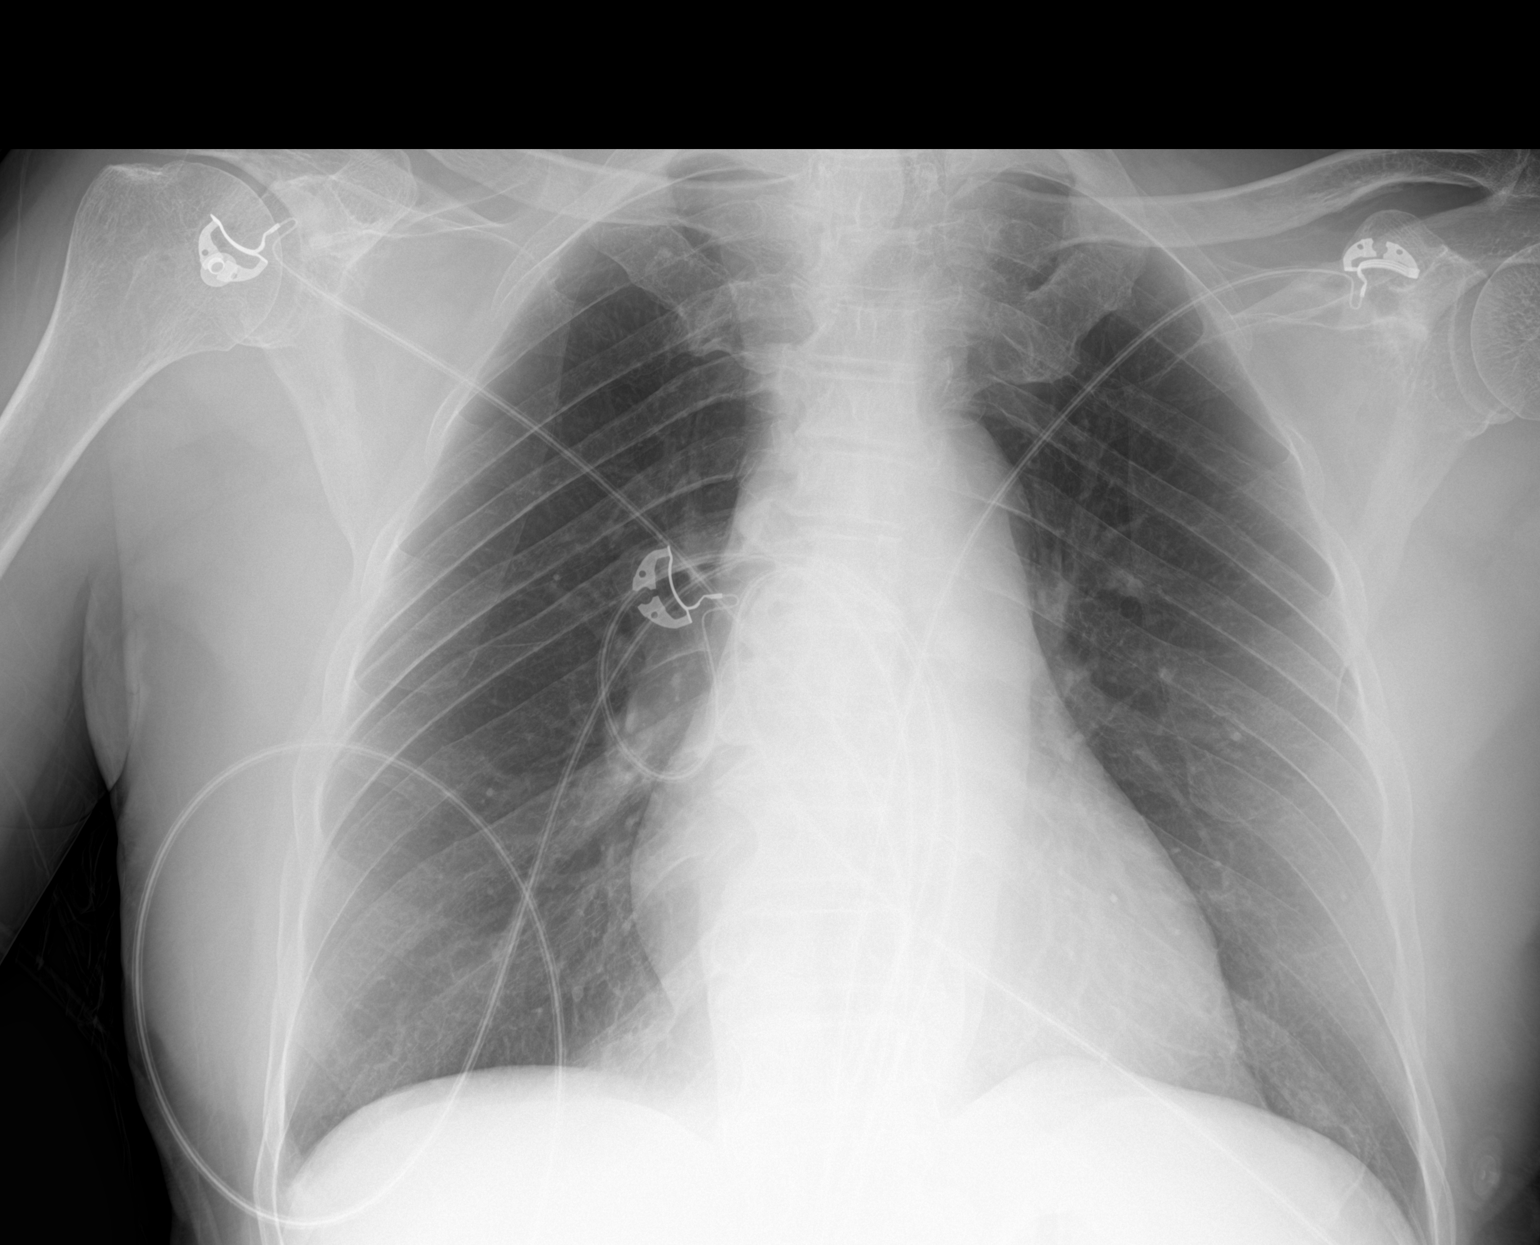

[1 of 1 positions shown; findings below may reference images not displayed]

FINDINGS: The cardiomediastinal silhouette is stable.

There is no focal consolidation or pulmonary edema. There is no
pleural effusion or pneumothorax.

There is no acute osseous abnormality. Cervical spine fusion
hardware is partially imaged. There are degenerative changes at the
right AC joint.
IMPRESSION: No radiographic evidence of acute cardiopulmonary process.

## 2022-07-09 IMAGING — CR DG ABDOMEN ACUTE W/ 1V CHEST
3 series · 3 of 3 positions shown · non-contrast
Comparison: August 03, 2021 and May 17, 2021

CLINICAL DATA: Productive cough.

EXAM:
DG ABDOMEN ACUTE WITH 1 VIEW CHEST

[w chest pa]
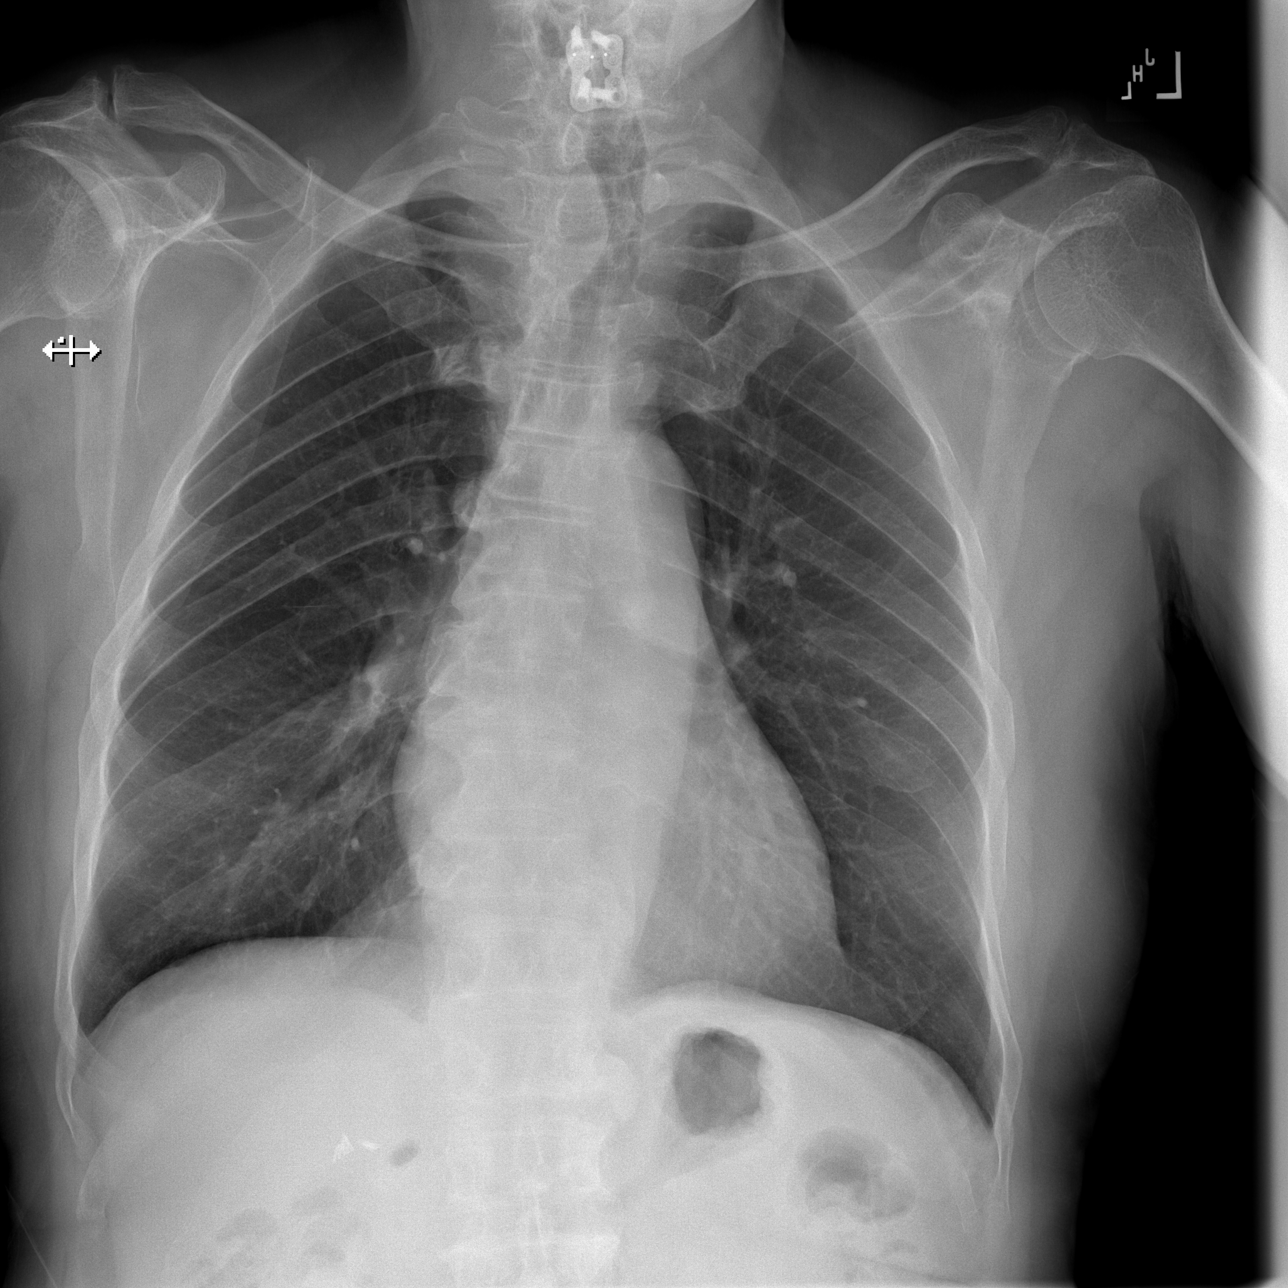

[w abdomen upright]
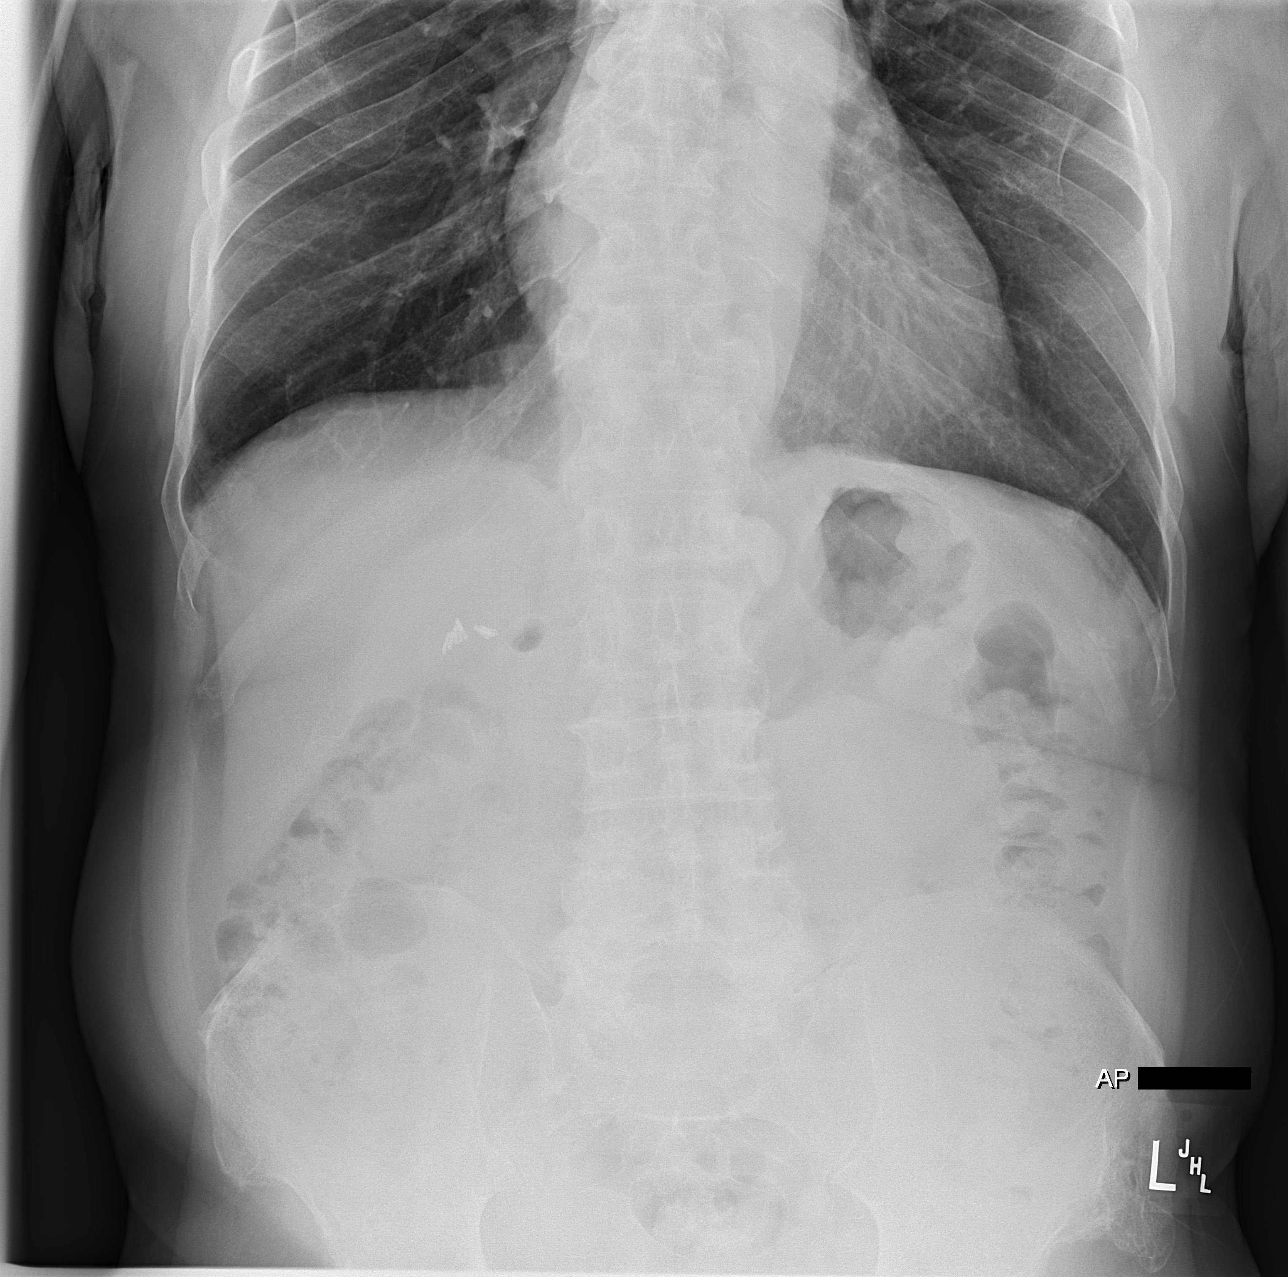

[t abdomen supine]
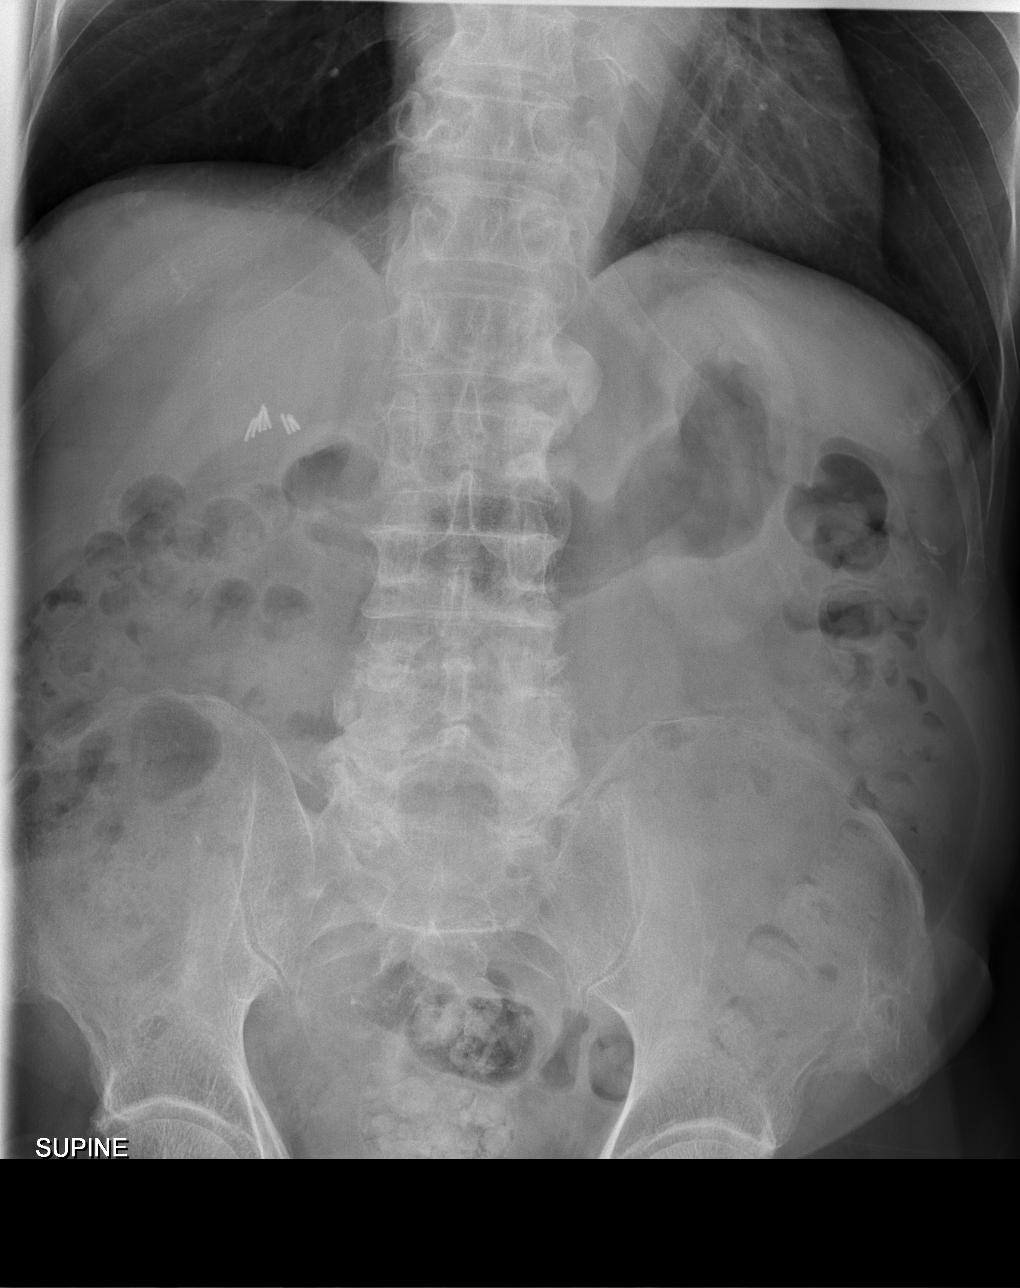

[3 of 3 positions shown; findings below may reference images not displayed]

FINDINGS: There is no evidence of dilated bowel loops or free intraperitoneal
air. A large amount of stool is seen throughout the colon.
Radiopaque surgical clips are seen within the right upper quadrant.
No radiopaque calculi or other significant radiographic abnormality
is seen. There is mild, stable tracheal deviation to the left of
midline. Heart size and mediastinal contours are otherwise within
normal limits. Both lungs are clear. A radiopaque fusion plate and
screws are seen overlying the lower cervical spine.
IMPRESSION: Negative abdominal radiographs.  No acute cardiopulmonary disease.

## 2022-07-24 ENCOUNTER — Ambulatory Visit: Payer: Self-pay

## 2022-07-24 NOTE — Patient Outreach (Signed)
  Care Coordination   07/24/2022 Name: Randy Merritt MRN: [id number] DOB: 03-06-1941   Care Coordination Outreach Attempts:  A second unsuccessful outreach was attempted today to offer the patient with information about available care coordination services as a benefit of their health plan.     Follow Up Plan:  Additional outreach attempts will be made to offer the patient care coordination information and services.   Encounter Outcome:  No Answer  Care Coordination Interventions Activated:  No   Care Coordination Interventions:  No, not indicated    Daneen Schick, BSW, CDP Social Worker, Certified Dementia Practitioner Care Coordination [phone]

## 2022-07-25 ENCOUNTER — Ambulatory Visit: Payer: Self-pay

## 2022-07-25 NOTE — Patient Instructions (Signed)
Visit Information  Thank you for taking time to visit with me today. Please don't hesitate to contact me if I can be of assistance to you.   Following are the goals we discussed today:   Goals Addressed             This Visit's Progress    COMPLETED: Care Coordination Activities       Care Coordination Interventions: SDoH screening performed - no acute resource challenges identified Discussed the patient does not have concerns with medications costs and/or adherence Reviewed patients spouse assists with iADL's as needed Education provided on role of care coordination team - no follow up needed at this time Instructed the patient to contact his primary care provider as needed        Please call the care guide team at [phone] if you need to schedule an appointment with our care coordination team  If you are experiencing a Mental Health or [place] or need someone to talk to, please call [phone]-TALK (toll free, 24 hour hotline)  Patient verbalizes understanding of instructions and care plan provided today and agrees to view in [facility]. Active MyChart status and patient understanding of how to access instructions and care plan via MyChart confirmed with patient.     No further follow up required: Please contact your primary care provider as needed  Daneen Schick, BSW, CDP Social Worker, Certified Dementia Practitioner Care Coordination [phone]

## 2022-07-25 NOTE — Patient Outreach (Signed)
  Care Coordination   Initial Visit Note   07/25/2022 Name: Randy Merritt MRN: [id number] DOB: 17-Jun-1941  Randy Merritt is a 81 y.o. year old male who sees Paliwal, Himanshu, MD for primary care. I spoke with  Selinda Eon by phone today.  What matters to the patients health and wellness today?  To attend dental appointment next week    Goals Addressed             This Visit's Progress    COMPLETED: Care Coordination Activities       Care Coordination Interventions: SDoH screening performed - no acute resource challenges identified Discussed the patient does not have concerns with medications costs and/or adherence Reviewed patients spouse assists with iADL's as needed Education provided on role of care coordination team - no follow up needed at this time Instructed the patient to contact his primary care provider as needed        SDOH assessments and interventions completed:  Yes  SDOH Interventions Today    Flowsheet Row Most Recent Value  SDOH Interventions   Food Insecurity Interventions Intervention Not Indicated  Housing Interventions Intervention Not Indicated  Transportation Interventions Intervention Not Indicated  Utilities Interventions Intervention Not Indicated        Care Coordination Interventions Activated:  Yes  Care Coordination Interventions:  Yes, provided   Follow up plan: No further intervention required.   Encounter Outcome:  Pt. Visit Completed   Daneen Schick, BSW, CDP Social Worker, Certified Dementia Practitioner Care Coordination [phone]

## 2022-10-03 ENCOUNTER — Other Ambulatory Visit: Payer: Self-pay | Admitting: Internal Medicine

## 2022-10-03 DIAGNOSIS — E040 Nontoxic diffuse goiter: Secondary | ICD-10-CM

## 2022-10-09 ENCOUNTER — Ambulatory Visit
Admission: RE | Admit: 2022-10-09 | Discharge: 2022-10-09 | Disposition: A | Discharge status: Home / Self Care | Payer: Medicare HMO | Source: Ambulatory Visit | Attending: Internal Medicine | Admitting: Internal Medicine

## 2022-10-09 ENCOUNTER — Other Ambulatory Visit: Payer: Self-pay | Admitting: Internal Medicine

## 2022-10-09 ENCOUNTER — Encounter: Payer: Self-pay | Admitting: Internal Medicine

## 2022-10-09 DIAGNOSIS — R0609 Other forms of dyspnea: Secondary | ICD-10-CM

## 2022-10-09 DIAGNOSIS — E049 Nontoxic goiter, unspecified: Secondary | ICD-10-CM

## 2022-10-11 ENCOUNTER — Encounter: Payer: Self-pay | Admitting: Internal Medicine

## 2022-10-11 DIAGNOSIS — E040 Nontoxic diffuse goiter: Secondary | ICD-10-CM

## 2022-10-17 ENCOUNTER — Ambulatory Visit
Admission: RE | Admit: 2022-10-17 | Discharge: 2022-10-17 | Disposition: A | Discharge status: Home / Self Care | Payer: Medicare HMO | Source: Ambulatory Visit | Attending: Internal Medicine | Admitting: Internal Medicine

## 2022-10-17 DIAGNOSIS — E049 Nontoxic goiter, unspecified: Secondary | ICD-10-CM

## 2022-10-19 ENCOUNTER — Other Ambulatory Visit: Payer: Self-pay | Admitting: Family Medicine

## 2022-10-19 ENCOUNTER — Ambulatory Visit
Admission: RE | Admit: 2022-10-19 | Discharge: 2022-10-19 | Disposition: A | Discharge status: Home / Self Care | Payer: Self-pay | Source: Ambulatory Visit | Attending: Family Medicine | Admitting: Family Medicine

## 2022-10-19 DIAGNOSIS — D34 Benign neoplasm of thyroid gland: Secondary | ICD-10-CM

## 2022-10-25 ENCOUNTER — Ambulatory Visit: Payer: Medicare HMO | Admitting: Podiatry

## 2022-11-16 ENCOUNTER — Ambulatory Visit: Payer: Medicare HMO | Admitting: Podiatry

## 2022-11-16 VITALS — BP 118/68 | HR 62

## 2022-11-16 DIAGNOSIS — M79675 Pain in left toe(s): Secondary | ICD-10-CM

## 2022-11-16 DIAGNOSIS — B351 Tinea unguium: Secondary | ICD-10-CM | POA: Diagnosis not present

## 2022-11-16 DIAGNOSIS — L600 Ingrowing nail: Secondary | ICD-10-CM | POA: Diagnosis not present

## 2022-11-16 DIAGNOSIS — M79674 Pain in right toe(s): Secondary | ICD-10-CM

## 2022-11-16 NOTE — Patient Instructions (Signed)
Monitor the right big toe where the nail was getting ingrown. If you notice any increase in swelling, redness, drainage, or signs of infection, let me know by calling [phone] or can you send me a MyChart message.   ---  Soak Instructions    THE DAY AFTER THE PROCEDURE  Place 1/4 cup of epsom salts in a quart of warm tap water.  Submerge your foot or feet with outer bandage intact for the initial soak; this will allow the bandage to become moist and wet for easy lift off.  Once you remove your bandage, continue to soak in the solution for 20 minutes.  This soak should be done twice a day.  Next, remove your foot or feet from solution, blot dry the affected area and cover.  You may use a band aid large enough to cover the area or use gauze and tape.  Apply other medications to the area as directed by the doctor such as polysporin neosporin.  IF YOUR SKIN BECOMES IRRITATED WHILE USING THESE INSTRUCTIONS, IT IS OKAY TO SWITCH TO  WHITE VINEGAR AND WATER. Or you may use antibacterial soap and water to keep the toe clean  Monitor for any signs/symptoms of infection. Call the office immediately if any occur or go directly to the emergency room. Call with any questions/concerns.

## 2022-11-16 NOTE — Progress Notes (Unsigned)
Subjective:  Chief Complaint  Patient presents with   Ingrown Toenail    Right hallux ingrown, and nail trim    82 year old male presents the office today with above concerns.  He states the nails are thick and discolored is having difficulty trimming himself, causing pain.  Particular on the right medial hallux he has some nail gets ingrown that he like to have removed.  Denies any drainage or pus.  Area is tender.  Objective: AAO x3, NAD DP/PT pulses palpable bilaterally, CRT less than 3 seconds Nails are hypertrophic, dystrophic, brittle, discolored, elongated 10.  On the right medial nail border is a spicule of toenail, incurvation of the nail border causing discomfort of the nail fold proximally.  There is no edema, erythema, drainage or pus.  No pain with calf compression, swelling, warmth, erythema  Assessment: Symptomatic onychomycosis, ingrown toenail  Plan: -All treatment options discussed with the patient including all alternatives, risks, complications.  -Sharply debrided nails x 10 without any complications.  On the right medial hallux I was able to debride this.  Discussed with him partial nail avulsion versus debridement. He would like to try and just to trim it out today.  Minimal bleeding occurred.  Cleaned the area.  Antibiotic ointment dressing applied.  If symptoms persist or there is any infection will need to have a partial nail avulsion.  He agrees with this plan should symptoms continue or worsen. -Patient encouraged to call the office with any questions, concerns, change in symptoms.   Trula Slade DPM

## 2022-11-19 ENCOUNTER — Other Ambulatory Visit: Payer: Self-pay | Admitting: Internal Medicine

## 2022-11-19 DIAGNOSIS — R1319 Other dysphagia: Secondary | ICD-10-CM

## 2022-11-21 ENCOUNTER — Ambulatory Visit: Payer: Medicare HMO | Attending: Internal Medicine | Admitting: Speech Pathology

## 2022-11-21 ENCOUNTER — Other Ambulatory Visit: Payer: Self-pay

## 2022-11-21 ENCOUNTER — Encounter: Payer: Self-pay | Admitting: Speech Pathology

## 2022-11-21 DIAGNOSIS — R1312 Dysphagia, oropharyngeal phase: Secondary | ICD-10-CM | POA: Insufficient documentation

## 2022-11-21 NOTE — Patient Instructions (Addendum)
   The swallow test on 11/27/22 will be with a liquid, standing and laying down. It will look at your throat, but focus on your esophagus mostly. It is called a barium swallow. A doctor does this test.  The modified barium test focuses on swallowing in your throat. This test includes various foods (crackers, fruit, applesauce), liquids and a pill. It is called a modified barium swallow and is with a speech therapist. [name] will call you to schedule this - go ahead and schedule it.   If the 1st swallow test doesn't find a problem, then go to the next swallow test  They may recommend that you come back to me for swallow exercises.  When you swallow food, swallow hard to help clear the food from your throat  Coughing in the morning can be a sign that reflux is entering your throat  The reflux pill gets rid of acid, but the non acid reflux still comes up

## 2022-11-21 NOTE — Therapy (Signed)
OUTPATIENT SPEECH LANGUAGE PATHOLOGY SWALLOW EVALUATION   Patient Name: Randy Merritt MRN: [id number] DOB:03/20/1941, 82 y.o., male Today's Date: 11/21/2022  PCP: Loura Pardon MD REFERRING PROVIDER: Corliss Blacker, MD  END OF SESSION:  End of Session - 11/21/22 1513     Visit Number 1    Number of Visits 25    Date for SLP Re-Evaluation 02/13/23    SLP Start Time 1400    SLP Stop Time  1445    SLP Time Calculation (min) 45 min    Activity Tolerance Patient tolerated treatment well             Past Medical History:  Diagnosis Date   Acid reflux    Anxiety    Arthritis    Depression    " a little"   Goiter    High cholesterol    History of blood transfusion    Perforated bowel ([place])    Seasonal allergies    Past Surgical History:  Procedure Laterality Date   ANTERIOR CERVICAL DECOMP/DISCECTOMY FUSION N/A 04/08/2013   Procedure: ANTERIOR CERVICAL DECOMPRESSION/DISCECTOMY FUSION 1 LEVEL;  Surgeon: Floyce Stakes, MD;  Location: [facility];  Service: Neurosurgery;  Laterality: N/A;  Cervical five-six Anterior cervical decompression/diskectomy fusion   APPENDECTOMY N/A 10/06/2019   Procedure: APPENDECTOMY;  Surgeon: Clovis Riley, MD;  Location: [place];  Service: General;  Laterality: N/A;   BIOPSY  01/30/2019   Procedure: BIOPSY;  Surgeon: Carol Ada, MD;  Location: [facility];  Service: Endoscopy;;   CERVICAL DISCECTOMY     x2   CHOLECYSTECTOMY     COLON RESECTION SIGMOID N/A 10/06/2019   Procedure: COLON RESECTION SIGMOID;  Surgeon: Clovis Riley, MD;  Location: [place];  Service: General;  Laterality: N/A;   ESOPHAGEAL DILATION  01/30/2019   Procedure: ESOPHAGEAL DILATION;  Surgeon: Carol Ada, MD;  Location: [facility];  Service: Endoscopy;;  [place]   ESOPHAGOGASTRODUODENOSCOPY (EGD) WITH PROPOFOL N/A 01/30/2019   Procedure: ESOPHAGOGASTRODUODENOSCOPY (EGD) WITH PROPOFOL;  Surgeon: Carol Ada, MD;  Location: [facility];  Service:  Endoscopy;  Laterality: N/A;   gallstone removal     LAPAROTOMY N/A 10/06/2019   Procedure: EXPLORATORY LAPAROTOMY;  Surgeon: Clovis Riley, MD;  Location: [place];  Service: General;  Laterality: N/A;   Patient Active Problem List   Diagnosis Date Noted   Atypical angina 03/12/2022   Allergic rhinitis due to pollen 02/12/2022   Chronic pain 02/12/2022   Memory loss 02/12/2022   Moderate recurrent major depression ([place]) 02/12/2022   Non-toxic multinodular goiter 02/12/2022   Primary insomnia 02/12/2022   Recurrent major depression in remission ([place]) 02/12/2022   Thrombocytopenia ([place]) 02/12/2022   Chronic constipation 02/15/2021   Cervical radiculopathy 01/24/2021   History of cervical spinal arthrodesis 01/24/2021   Essential (primary) hypertension 01/16/2021   Neck pain 01/16/2021   Aphasia 07/01/2020   TIA (transient ischemic attack) 06/29/2020   Globus pharyngeus 05/20/2020   Goiter 12/24/2019   Perforated sigmoid colon ([place]) 10/06/2019   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/2019   Anxiety 04/14/2018   BPH (benign prostatic hyperplasia) 04/14/2018   CKD (chronic kidney disease) stage 3, GFR 30-59 ml/min (HCC) 04/14/2018   History of adenomatous polyp of colon 04/14/2018   Hyperlipidemia 04/14/2018   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   Ingrown toenail 10/11/2017   Chondrodermatitis nodularis helicis of right ear 62/70/3500   Oropharyngeal dysphagia 02/02/2017   Chronic rhinitis 02/01/2017   Gastroesophageal reflux disease 02/01/2017    ONSET  DATE: 11/08/2022 (referral, barium swallow Dec 2021)   REFERRING DIAG: R13.10 (ICD-10-CM) - Dysphagia  THERAPY DIAG:  Dysphagia, oropharyngeal phase  Rationale for Evaluation and Treatment: Rehabilitation  SUBJECTIVE:   SUBJECTIVE STATEMENT: "I have to swallow several times to get it down" Pt accompanied by: significant other [place]: Mr. Nou has h/o general swallow difficulty since  2021. His wife reported what seemed like ACDF in 2000 with some dysphagia since. He has h/o esophageal dysmotility/presbyesophagus. He c/o globus sensation, coughing upon waking, and clearing his throat throughout the day due to phlegm and tightness. He has repeat UGI scheduled for 11/27/22. He required hospitalization after a colonoscopy and was told he had a small stroke, they are unsure about timeline or details.   PAIN:  Are you having pain? No  FALLS: Has patient fallen in last 6 months?  Yes, Number of falls: 1  LIVING ENVIRONMENT: Lives with: lives with their spouse Lives in: House/apartment  PLOF:  Level of assistance: Independent with ADLs, Needed assistance with IADLS Employment: Retired  PATIENT GOALS: "To swallow better"  OBJECTIVE:   DIAGNOSTIC FINDINGS: UGI 10/18/20: IMPRESSION: 1. Delayed oral phase swallow initiation. Mild barium retention in the vallecula and piriform sinuses. No laryngeal penetration or tracheobronchial aspiration. 2. Moderate esophageal dysmotility, with presbyesophagus pattern. 3. No hiatal hernia. No gastroesophageal reflux elicited. No evidence of esophageal mass or stricture.    COGNITION: Overall cognitive status: History of cognitive impairments - at baseline Areas of impairment:  Memory: Impaired: Immediate Short term Functional deficits: forgets things his wife tells him  ORAL MOTOR EXAMINATION: Overall status: WFL Comments: missing dentition  CLINICAL SWALLOW ASSESSMENT:   Current diet: regular and thin liquids Dentition: missing dentition Patient directly observed with POs: Yes: dysphagia 3 (soft) and thin liquids  Feeding: able to feed self Liquids provided by: cup Oral phase signs and symptoms: prolonged mastication, oral holding, and diffuse oral residue manipulated for 2+ minutes after swallows Pharyngeal phase signs and symptoms: multiple swallows, immediate throat clear, delayed throat clear, immediate cough, complaints  of residue, complaints of globus, and belching     TODAY'S TREATMENT:                                                                                                                                         DATE: 11/21/22: Provided education on barium vs modified barium swallow tests (see pt instructions) and possibility of returning here pending results of MBSS. Reflux precautions  PATIENT EDUCATION: Education details: See today's treatment and patient instructions Person educated: Patient and Spouse Education method: Explanation, Demonstration, Verbal cues, and Handouts Education comprehension: verbalized understanding, verbal cues required, and needs further education   ASSESSMENT:  CLINICAL IMPRESSION: Patient is a 82 y.o. male who was seen today for oropharyngeal dysphagia. Mr Dulude has c/o food sticking in his throat, globus, and frequent throat clearing due to tight throat. He has  UGI scheduled for 11/27/22. He gets "choked" on water at home occasionally and capsule pills "get stuck" regularly. He avoids red meat due to difficulty chewing and swallowing. They deny weight loss, pneumonia or bronchitis. He was noted to belch with PO trials and afterward, indicating esophageal dysphagia. Due to persistent dysphagia and overt s/s of oral and pharyngeal dysphagia, recommend MBSS and dysphagia therapy pending results of MBSS. They prefer [facility] for MBSS   OBJECTIVE IMPAIRMENTS: include memory, executive functioning, and dysphagia. These impairments are limiting patient from safety when swallowing. Factors affecting potential to achieve goals and functional outcome are ability to learn/carryover information. Patient will benefit from skilled SLP services to address above impairments and improve overall function.  REHAB POTENTIAL: Good   GOALS: Goals reviewed with patient? Yes  SHORT TERM GOALS: Target date: 12/19/2022  Pt will complete HEP for dysphagia with occasional min  A Baseline: Goal status: INITIAL  2.  Pt/spouse will carryover diet modifications with occasional min A Baseline:  Goal status: INITIAL  3.  Pt will carryover swallow precautions with occasional min A Baseline:  Goal status: INITIAL  LONG TERM GOALS: Target date: 02/13/23  Pt will complete HEP for dysphagia with rare min A Baseline:  Goal status: INITIAL  2.  Pt/spouse will carryover diet modifications with rare min A Baseline:  Goal status: INITIAL  3.  Pt will follow swallow precautions with rare min A Baseline:  Goal status: INITIAL  4.  Pt/spouse will verbalize reflux precautions with occasional min A Baseline:  Goal status: INITIAL   PLAN:  SLP FREQUENCY: 2x/week  SLP DURATION: 12 weeks  PLANNED INTERVENTIONS: Aspiration precaution training, Pharyngeal strengthening exercises, Diet toleration management , Environmental controls, Trials of upgraded texture/liquids, Cueing hierachy, Cognitive reorganization, Internal/external aids, SLP instruction and feedback, and Compensatory strategies    Rochell Puett, Annye Rusk, [place] 11/21/2022, 3:13 PM

## 2022-11-22 ENCOUNTER — Other Ambulatory Visit (HOSPITAL_COMMUNITY): Payer: Self-pay

## 2022-11-22 ENCOUNTER — Telehealth (HOSPITAL_COMMUNITY): Payer: Self-pay

## 2022-11-22 DIAGNOSIS — R1310 Dysphagia, unspecified: Secondary | ICD-10-CM

## 2022-11-27 ENCOUNTER — Ambulatory Visit
Admission: RE | Admit: 2022-11-27 | Discharge: 2022-11-27 | Disposition: A | Discharge status: Home / Self Care | Payer: Medicare HMO | Source: Ambulatory Visit | Attending: Internal Medicine | Admitting: Internal Medicine

## 2022-11-27 DIAGNOSIS — R1319 Other dysphagia: Secondary | ICD-10-CM

## 2022-12-05 ENCOUNTER — Ambulatory Visit (HOSPITAL_COMMUNITY)
Admission: RE | Admit: 2022-12-05 | Discharge: 2022-12-05 | Disposition: A | Discharge status: Home / Self Care | Payer: Medicare HMO | Source: Ambulatory Visit | Attending: Family Medicine | Admitting: Family Medicine

## 2022-12-05 DIAGNOSIS — R1312 Dysphagia, oropharyngeal phase: Secondary | ICD-10-CM

## 2022-12-05 DIAGNOSIS — R1311 Dysphagia, oral phase: Secondary | ICD-10-CM | POA: Insufficient documentation

## 2022-12-05 DIAGNOSIS — R1310 Dysphagia, unspecified: Secondary | ICD-10-CM

## 2022-12-19 ENCOUNTER — Ambulatory Visit: Payer: Medicare HMO | Attending: Internal Medicine | Admitting: Speech Pathology

## 2022-12-19 DIAGNOSIS — R1312 Dysphagia, oropharyngeal phase: Secondary | ICD-10-CM | POA: Insufficient documentation

## 2022-12-19 NOTE — Therapy (Signed)
OUTPATIENT SPEECH LANGUAGE PATHOLOGY TREATMENT NOTE   Patient Name: Randy Merritt MRN: [id number] DOB:18-Jun-1941, 82 y.o., male Today's Date: 12/19/2022  PCP: Randy Blacker, MD REFERRING PROVIDER: Corliss Blacker, MD  END OF SESSION:  End of Session - 12/19/22 1455     Visit Number 2    Number of Visits 25    Date for SLP Re-Evaluation 02/13/23    SLP Start Time 64    SLP Stop Time  1435    SLP Time Calculation (min) 35 min    Activity Tolerance Patient tolerated treatment well             Past Medical History:  Diagnosis Date   Acid reflux    Anxiety    Arthritis    Depression    " a little"   Goiter    High cholesterol    History of blood transfusion    Perforated bowel ([place])    Seasonal allergies    Past Surgical History:  Procedure Laterality Date   ANTERIOR CERVICAL DECOMP/DISCECTOMY FUSION N/A 04/08/2013   Procedure: ANTERIOR CERVICAL DECOMPRESSION/DISCECTOMY FUSION 1 LEVEL;  Surgeon: Floyce Stakes, MD;  Location: [facility];  Service: Neurosurgery;  Laterality: N/A;  Cervical five-six Anterior cervical decompression/diskectomy fusion   APPENDECTOMY N/A 10/06/2019   Procedure: APPENDECTOMY;  Surgeon: Clovis Riley, MD;  Location: [place];  Service: General;  Laterality: N/A;   BIOPSY  01/30/2019   Procedure: BIOPSY;  Surgeon: Carol Ada, MD;  Location: [facility];  Service: Endoscopy;;   CERVICAL DISCECTOMY     x2   CHOLECYSTECTOMY     COLON RESECTION SIGMOID N/A 10/06/2019   Procedure: COLON RESECTION SIGMOID;  Surgeon: Clovis Riley, MD;  Location: [place];  Service: General;  Laterality: N/A;   ESOPHAGEAL DILATION  01/30/2019   Procedure: ESOPHAGEAL DILATION;  Surgeon: Carol Ada, MD;  Location: [facility];  Service: Endoscopy;;  [place]   ESOPHAGOGASTRODUODENOSCOPY (EGD) WITH PROPOFOL N/A 01/30/2019   Procedure: ESOPHAGOGASTRODUODENOSCOPY (EGD) WITH PROPOFOL;  Surgeon: Carol Ada, MD;  Location: [facility];  Service:  Endoscopy;  Laterality: N/A;   gallstone removal     LAPAROTOMY N/A 10/06/2019   Procedure: EXPLORATORY LAPAROTOMY;  Surgeon: Clovis Riley, MD;  Location: [place];  Service: General;  Laterality: N/A;   Patient Active Problem List   Diagnosis Date Noted   Atypical angina 03/12/2022   Allergic rhinitis due to pollen 02/12/2022   Chronic pain 02/12/2022   Memory loss 02/12/2022   Moderate recurrent major depression ([place]) 02/12/2022   Non-toxic multinodular goiter 02/12/2022   Primary insomnia 02/12/2022   Recurrent major depression in remission ([place]) 02/12/2022   Thrombocytopenia ([place]) 02/12/2022   Chronic constipation 02/15/2021   Cervical radiculopathy 01/24/2021   History of cervical spinal arthrodesis 01/24/2021   Essential (primary) hypertension 01/16/2021   Neck pain 01/16/2021   Aphasia 07/01/2020   TIA (transient ischemic attack) 06/29/2020   Globus pharyngeus 05/20/2020   Goiter 12/24/2019   Perforated sigmoid colon ([place]) 10/06/2019   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/2019   Anxiety 04/14/2018   BPH (benign prostatic hyperplasia) 04/14/2018   CKD (chronic kidney disease) stage 3, GFR 30-59 ml/min (HCC) 04/14/2018   History of adenomatous polyp of colon 04/14/2018   Hyperlipidemia 04/14/2018   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   Ingrown toenail 10/11/2017   Chondrodermatitis nodularis helicis of right ear 37/02/8888   Oropharyngeal dysphagia 02/02/2017   Chronic rhinitis 02/01/2017   Gastroesophageal reflux disease 02/01/2017  ONSET DATE: 11/08/22 (referral date)   REFERRING DIAG: R13.10 (ICD-10-CM) - Dysphagia  THERAPY DIAG:  Dysphagia, oropharyngeal phase  Rationale for Evaluation and Treatment: Rehabilitation  SUBJECTIVE:   SUBJECTIVE STATEMENT: "I have to swallow 2 times"  PAIN:  Are you having pain? No    OBJECTIVE: MBSS 12/05/22: Pt presents with mild oral and moderate pharyngo-cervical esophageal dysphagia. Recommend pt consume  plenty of liquids continue dys3/thin diet - starting intake with liquids, following bites of foods with sips of liquids and conducting dry swallows. Advised he speak to pharmacist to see if medications can be crushed or changed to suspension.   TODAY'S TREATMENT:                                                                                                                                         DATE:   12/19/22: MBSS completed 12/05/22. Reviewed results with Randy Merritt and Randy Merritt. Trained in [place] is serving him soft solids consistently. Trained pt in 3 exercises for HEP (effortful swallow, masako, and CTAR)  He completed effortful swallow and CTAR with usual mod verbal cues, modeling and visual cues. Consistent max A for Masako 2/12x. Instructed them to use applesauce for meds 1 at a time due to pill lodging in valleculae. Initiated training in dry swallow and alternating solids and liquids with usual min verbal cues. Randy Merritt will cue him at home.   11/21/22: Provided education on barium vs modified barium swallow tests (see pt instructions) and possibility of returning here pending results of MBSS. Reflux precautions   PATIENT EDUCATION: Education details: See today's treatment and patient instructions Person educated: Patient and Spouse Education method: Explanation, Demonstration, Verbal cues, and Handouts Education comprehension: verbalized understanding, verbal cues required, and needs further education     ASSESSMENT:   CLINICAL IMPRESSION: Patient is a 82 y.o. male who was seen today for oropharyngeal dysphagia. Randy Merritt has c/o food sticking in his throat, globus, and frequent throat clearing due to tight throat. He has UGI scheduled for 11/27/22. He gets "choked" on water at home occasionally and capsule pills "get stuck" regularly. MBSS completed and recommended continue outpatient dysphagia therapy. Initiated training in HEP, diet modifications and swallow  precautions. Continue skilled ST to maximize ease and safety of swallow.   OBJECTIVE IMPAIRMENTS: include memory, executive functioning, and dysphagia. These impairments are limiting patient from safety when swallowing. Factors affecting potential to achieve goals and functional outcome are ability to learn/carryover information. Patient will benefit from skilled SLP services to address above impairments and improve overall function.   REHAB POTENTIAL: Good     GOALS: Goals reviewed with patient? Yes   SHORT TERM GOALS: Target date: 12/19/2022   Pt will complete HEP for dysphagia with occasional min A Baseline: Goal status: Ongoing   2.  Pt/spouse will carryover diet modifications with occasional min A Baseline:  Goal status: Ongoing  3.  Pt will carryover swallow precautions with occasional min A Baseline:  Goal status: Ongoing   LONG TERM GOALS: Target date: 02/13/23   Pt will complete HEP for dysphagia with rare min A Baseline:  Goal status: Ongoing   2.  Pt/spouse will carryover diet modifications with rare min A Baseline:  Goal status: Ongoing   3.  Pt will follow swallow precautions with rare min A Baseline:  Goal status: Ongoing   4.  Pt/spouse will verbalize reflux precautions with occasional min A Baseline:  Goal status: Ongoing     PLAN:   SLP FREQUENCY: 2x/week   SLP DURATION: 12 weeks   PLANNED INTERVENTIONS: Aspiration precaution training, Pharyngeal strengthening exercises, Diet toleration management , Environmental controls, Trials of upgraded texture/liquids, Cueing hierachy, Cognitive reorganization, Internal/external aids, SLP instruction and feedback, and Compensatory strategies       Mckaylin Bastien, Annye Rusk, [place] 12/19/2022, 2:56 PM

## 2022-12-19 NOTE — Patient Instructions (Signed)
   Softer foods Slow rate 2 swallows for each bite Alternate solids and liquids Pills 1 at a time in applesauce or crushed if needed Avoid distractions with meals  SWALLOWING EXERCISES Effortful Swallows - Squeeze hard with the muscles in your neck while you swallow your  saliva or a sip of water - Repeat 20 times, 2-3 times a day, and use whenever you eat or drink  Masako Swallow - swallow with your tongue sticking out - Stick tongue out and gently bite tongue with your teeth - Swallow, while holding your tongue with your teeth - Repeat 20 times, 2-3 times a day   Shaker Exercise - head lift - Lie flat on your back in your bed or on a couch without pillows - Raise your head and look at your feet  - KEEP YOUR SHOULDERS DOWN - HOLD FOR 45 to 60 SECONDS, then lower your head back down - Repeat 3 times, 2-3 times a day   [employer] -  swallow as tight as you  for 5 seconds - Start to swallow, and keep your Adam's apple up by squeezing tight with the muscles of the throat - Hold the squeeze for 5-7 seconds and then relax - Repeat 20 times, 2-3 times a day   Tongue Press - Press your entire tongue as hard as you can against the roof of your mouth for 3-5 seconds - Repeat 20 times, 2-3 times a day        6. CTAR - Chin Tuck Against Resistance              - Place towel, ball or pool noodle under your chin             - Hold for 60 seconds 2-3x  a day             - Pulse up and down 20x 2-3x a day       7. Hold your breath, swallow and cough - do not breathe in before you cough              - 20x twice a day

## 2023-01-07 ENCOUNTER — Encounter: Payer: Medicare HMO | Admitting: Speech Pathology

## 2023-01-14 ENCOUNTER — Encounter: Payer: Medicare HMO | Admitting: Speech Pathology

## 2023-02-21 DIAGNOSIS — E559 Vitamin D deficiency, unspecified: Secondary | ICD-10-CM | POA: Diagnosis not present

## 2023-02-21 DIAGNOSIS — E782 Mixed hyperlipidemia: Secondary | ICD-10-CM | POA: Diagnosis not present

## 2023-02-21 DIAGNOSIS — R413 Other amnesia: Secondary | ICD-10-CM | POA: Diagnosis not present

## 2023-02-21 DIAGNOSIS — K5901 Slow transit constipation: Secondary | ICD-10-CM | POA: Diagnosis not present

## 2023-02-21 DIAGNOSIS — E538 Deficiency of other specified B group vitamins: Secondary | ICD-10-CM | POA: Diagnosis not present

## 2023-02-21 DIAGNOSIS — I10 Essential (primary) hypertension: Secondary | ICD-10-CM | POA: Diagnosis not present

## 2023-02-21 DIAGNOSIS — N1831 Chronic kidney disease, stage 3a: Secondary | ICD-10-CM | POA: Diagnosis not present

## 2023-02-21 DIAGNOSIS — E042 Nontoxic multinodular goiter: Secondary | ICD-10-CM | POA: Diagnosis not present

## 2023-03-18 DIAGNOSIS — D691 Qualitative platelet defects: Secondary | ICD-10-CM | POA: Diagnosis not present

## 2023-03-18 DIAGNOSIS — E042 Nontoxic multinodular goiter: Secondary | ICD-10-CM | POA: Diagnosis not present

## 2023-03-18 DIAGNOSIS — F331 Major depressive disorder, recurrent, moderate: Secondary | ICD-10-CM | POA: Diagnosis not present

## 2023-03-18 DIAGNOSIS — E782 Mixed hyperlipidemia: Secondary | ICD-10-CM | POA: Diagnosis not present

## 2023-03-18 DIAGNOSIS — G3184 Mild cognitive impairment, so stated: Secondary | ICD-10-CM | POA: Diagnosis not present

## 2023-03-18 DIAGNOSIS — N1831 Chronic kidney disease, stage 3a: Secondary | ICD-10-CM | POA: Diagnosis not present

## 2023-03-18 DIAGNOSIS — R739 Hyperglycemia, unspecified: Secondary | ICD-10-CM | POA: Diagnosis not present

## 2023-03-18 DIAGNOSIS — I10 Essential (primary) hypertension: Secondary | ICD-10-CM | POA: Diagnosis not present

## 2023-03-18 DIAGNOSIS — H9190 Unspecified hearing loss, unspecified ear: Secondary | ICD-10-CM | POA: Diagnosis not present

## 2023-03-21 DIAGNOSIS — Z9049 Acquired absence of other specified parts of digestive tract: Secondary | ICD-10-CM | POA: Diagnosis not present

## 2023-03-21 DIAGNOSIS — K229 Disease of esophagus, unspecified: Secondary | ICD-10-CM | POA: Diagnosis not present

## 2023-03-21 DIAGNOSIS — R1312 Dysphagia, oropharyngeal phase: Secondary | ICD-10-CM | POA: Diagnosis not present

## 2023-03-21 DIAGNOSIS — R1314 Dysphagia, pharyngoesophageal phase: Secondary | ICD-10-CM | POA: Diagnosis not present

## 2023-04-01 DIAGNOSIS — E0789 Other specified disorders of thyroid: Secondary | ICD-10-CM | POA: Diagnosis not present

## 2023-04-01 DIAGNOSIS — R1310 Dysphagia, unspecified: Secondary | ICD-10-CM | POA: Diagnosis not present

## 2023-04-01 DIAGNOSIS — E042 Nontoxic multinodular goiter: Secondary | ICD-10-CM | POA: Diagnosis not present

## 2023-04-03 DIAGNOSIS — F411 Generalized anxiety disorder: Secondary | ICD-10-CM | POA: Diagnosis not present

## 2023-04-10 ENCOUNTER — Other Ambulatory Visit (HOSPITAL_COMMUNITY): Payer: Self-pay | Admitting: Surgery

## 2023-04-10 DIAGNOSIS — R1310 Dysphagia, unspecified: Secondary | ICD-10-CM

## 2023-04-10 DIAGNOSIS — E049 Nontoxic goiter, unspecified: Secondary | ICD-10-CM

## 2023-04-17 DIAGNOSIS — K2950 Unspecified chronic gastritis without bleeding: Secondary | ICD-10-CM | POA: Diagnosis not present

## 2023-04-17 DIAGNOSIS — K2289 Other specified disease of esophagus: Secondary | ICD-10-CM | POA: Diagnosis not present

## 2023-04-17 DIAGNOSIS — B3781 Candidal esophagitis: Secondary | ICD-10-CM | POA: Diagnosis not present

## 2023-04-17 DIAGNOSIS — K319 Disease of stomach and duodenum, unspecified: Secondary | ICD-10-CM | POA: Diagnosis not present

## 2023-04-17 DIAGNOSIS — K222 Esophageal obstruction: Secondary | ICD-10-CM | POA: Diagnosis not present

## 2023-04-17 DIAGNOSIS — R1310 Dysphagia, unspecified: Secondary | ICD-10-CM | POA: Diagnosis not present

## 2023-04-20 ENCOUNTER — Emergency Department (HOSPITAL_COMMUNITY): Payer: Medicare HMO

## 2023-04-20 ENCOUNTER — Emergency Department (HOSPITAL_COMMUNITY)
Admission: EM | Admit: 2023-04-20 | Discharge: 2023-04-20 | Disposition: A | Discharge status: Home / Self Care | Payer: Medicare HMO | Attending: Emergency Medicine | Admitting: Emergency Medicine

## 2023-04-20 ENCOUNTER — Other Ambulatory Visit: Payer: Self-pay

## 2023-04-20 ENCOUNTER — Encounter (HOSPITAL_COMMUNITY): Payer: Self-pay

## 2023-04-20 DIAGNOSIS — W28XXXA Contact with powered lawn mower, initial encounter: Secondary | ICD-10-CM | POA: Diagnosis not present

## 2023-04-20 DIAGNOSIS — M949 Disorder of cartilage, unspecified: Secondary | ICD-10-CM | POA: Insufficient documentation

## 2023-04-20 DIAGNOSIS — M79661 Pain in right lower leg: Secondary | ICD-10-CM | POA: Diagnosis not present

## 2023-04-20 DIAGNOSIS — M899 Disorder of bone, unspecified: Secondary | ICD-10-CM | POA: Diagnosis not present

## 2023-04-20 DIAGNOSIS — S8011XA Contusion of right lower leg, initial encounter: Secondary | ICD-10-CM

## 2023-04-20 DIAGNOSIS — S99911A Unspecified injury of right ankle, initial encounter: Secondary | ICD-10-CM | POA: Diagnosis not present

## 2023-04-20 DIAGNOSIS — M25571 Pain in right ankle and joints of right foot: Secondary | ICD-10-CM | POA: Diagnosis not present

## 2023-04-20 DIAGNOSIS — S8991XA Unspecified injury of right lower leg, initial encounter: Secondary | ICD-10-CM | POA: Diagnosis not present

## 2023-04-20 MED ORDER — TRAMADOL HCL 50 MG PO TABS: 50.0000 mg | ORAL_TABLET | Freq: Four times a day (QID) | ORAL | 0 refills | Status: DC | PRN

## 2023-04-20 NOTE — Discharge Instructions (Addendum)
You have a hematoma from the injury. You do not have a fracture right now  Take Tylenol for pain and tramadol for severe pain  Please elevate your legs  You may have a cyst or a lesion in your talar dome.  You can follow-up with orthopedic doctor you have persistent pain  Return to ER if you have severe pain, unable to walk

## 2023-04-20 NOTE — ED Triage Notes (Signed)
Pt. Arrives POV with wife c/o and ankle injury. He states about a week ago, he was standing in the yard while his brother was cutting grass when the lawn mower threw a rock and hit him in the Right ankle. The ankle is swollen with obvious bruising. Pt. Is ambulatory in triage.

## 2023-04-20 NOTE — ED Notes (Signed)
Patient given discharge instructions and follow up care. Patient verbalized understanding. Patient ambulatory out of ED. 

## 2023-04-20 NOTE — ED Provider Notes (Signed)
[place] EMERGENCY DEPARTMENT AT [facility] Provider Note   CSN: [id number] Arrival date & time: 04/20/23  1928     History  Chief Complaint  Patient presents with   Ankle Pain    Suleyman Ehrman is a 82 y.o. male history of anxiety, depression here presenting with right ankle pain.  He states that he was outside when family was mowing the lawn.  He states that a rock hit his right shin at high-speed about a week ago.  He has been icing and taking Tylenol.  He states that he has persistent pain and swelling in that area.  He states that he is able to bear weight on it.  Denies any other injuries.  The history is provided by the patient.       Home Medications Prior to Admission medications   Medication Sig Start Date End Date Taking? Authorizing Provider  acetaminophen (TYLENOL) 500 MG tablet Take 2 tablets (1,000 mg total) by mouth every 8 (eight) hours. Patient taking differently: Take 1,000 mg by mouth every 8 (eight) hours as needed. pain 10/15/19   Sherrie George, PA-C  ALPRAZolam Prudy Feeler) 1 MG tablet Take by mouth. 03/03/22   [provider]  cetirizine (ZYRTEC) 5 MG tablet Take 1 tablet (5 mg total) by mouth in the morning and at bedtime. 03/30/22   Kommor, Madison, MD  dextromethorphan (DELSYM) 30 MG/5ML liquid Take 2.5 mLs (15 mg total) by mouth 2 (two) times daily. 03/29/22   Carlisle Beers, FNP  esomeprazole (NEXIUM) 40 MG capsule Take 1 capsule (40 mg total) by mouth daily. 03/29/22   Carlisle Beers, FNP  famotidine (PEPCID) 20 MG tablet Take 1 tablet (20 mg total) by mouth 2 (two) times daily. 03/30/22   Kommor, Madison, MD  metoprolol succinate (TOPROL-XL) 25 MG 24 hr tablet Take 25 mg by mouth daily. 07/20/21   [provider]  Multiple Vitamins-Iron (MULTIVITAMINS WITH IRON) TABS tablet You should get a Women's One a day vitamin with iron and take daily.  This will help with your low hemoglobin.  You can buy this at any drug  store, and choose whatever brand you like. Patient taking differently: Take 1 tablet by mouth daily. 10/15/19   Sherrie George, PA-C  ondansetron (ZOFRAN-ODT) 4 MG disintegrating tablet Take 1 tablet (4 mg total) by mouth every 8 (eight) hours as needed for nausea or vomiting. 03/29/22   Carlisle Beers, FNP  Rosuvastatin Calcium 10 MG CPSP Take 10 mg by mouth daily. 05/26/21   [provider]  timolol (TIMOPTIC) 0.5 % ophthalmic solution Place 1 drop into both eyes 2 (two) times daily. 11/23/20   [provider]      Allergies    Alprazolam, Cyclobenzaprine, and Prednisone    Review of Systems   Review of Systems  Musculoskeletal:        Right leg pain  All other systems reviewed and are negative.   Physical Exam Updated Vital Signs BP 124/80 (BP Location: Left Arm)   Pulse 60   Temp 97.6 F (36.4 C) (Oral)   Resp 18   SpO2 100%  Physical Exam Vitals and nursing note reviewed.  HENT:     Head: Normocephalic.     Nose: Nose normal.     Mouth/Throat:     Mouth: Mucous membranes are moist.  Eyes:     Pupils: Pupils are equal, round, and reactive to light.  Cardiovascular:     Rate and Rhythm: Normal  rate.     Pulses: Normal pulses.  Pulmonary:     Effort: Pulmonary effort is normal.  Abdominal:     General: Abdomen is flat.  Musculoskeletal:     Cervical back: Normal range of motion.     Comments: Mild tenderness in the lower tib-fib area with hematoma.  Patient has mild right ankle swelling as well.  No skin breakdown.  Neurological:     General: No focal deficit present.     Mental Status: He is alert and oriented to person, place, and time.  Psychiatric:        Mood and Affect: Mood normal.     ED Results / Procedures / Treatments   Labs (all labs ordered are listed, but only abnormal results are displayed) Labs Reviewed - No data to display  EKG None  Radiology DG Ankle Complete Right  Result Date: 04/20/2023 CLINICAL DATA:  Pain  after injury EXAM: RIGHT ANKLE - COMPLETE 3 VIEW; RIGHT TIBIA AND FIBULA - 2 VIEW COMPARISON:  None Available. FINDINGS: Tibia and fibula: No fracture or dislocation. Preserved bone mineralization. Minimal degenerative changes of the adjacent knee joint. Ankle: No acute fracture or dislocation. Preserved joint spaces and bone mineralization. There is a lucency along the talar dome medially. This is with a thin sclerotic rim. Possibilities include geode formation versus an osteochondral abnormality. IMPRESSION: No acute osseous abnormality. Lucency along the medial aspect of the talar dome. Possibilities include subchondral cyst or geode versus an osteochondral abnormality. Please correlate with any prior or additional workup when appropriate such as MRI. Electronically Signed   By: Karen Kays M.D.   On: 04/20/2023 21:12   DG Tibia/Fibula Right  Result Date: 04/20/2023 CLINICAL DATA:  Pain after injury EXAM: RIGHT ANKLE - COMPLETE 3 VIEW; RIGHT TIBIA AND FIBULA - 2 VIEW COMPARISON:  None Available. FINDINGS: Tibia and fibula: No fracture or dislocation. Preserved bone mineralization. Minimal degenerative changes of the adjacent knee joint. Ankle: No acute fracture or dislocation. Preserved joint spaces and bone mineralization. There is a lucency along the talar dome medially. This is with a thin sclerotic rim. Possibilities include geode formation versus an osteochondral abnormality. IMPRESSION: No acute osseous abnormality. Lucency along the medial aspect of the talar dome. Possibilities include subchondral cyst or geode versus an osteochondral abnormality. Please correlate with any prior or additional workup when appropriate such as MRI. Electronically Signed   By: Karen Kays M.D.   On: 04/20/2023 21:12    Procedures Procedures    Medications Ordered in ED Medications - No data to display  ED Course/ Medical Decision Making/ A&P                             Medical Decision Making Jr Kovacich  is a 82 y.o. male here presenting with injury to the distal right tib-fib area.  I reviewed x-rays of the tib-fib and ankle and there was no foreign body.  There is also no fracture.  Patient has a incidental lucency along the medial aspect of the talar bone and may be a cyst.  I think this can be followed outpatient with Ortho.   Problems Addressed: Contusion of right lower extremity, initial encounter: acute illness or injury Osteochondral talar dome lesion: acute illness or injury  Amount and/or Complexity of Data Reviewed Radiology: ordered.    Final Clinical Impression(s) / ED Diagnoses Final diagnoses:  None    Rx / DC Orders ED  Discharge Orders     None         Charlynne Pander, MD 04/20/23 2151

## 2023-04-23 DIAGNOSIS — K319 Disease of stomach and duodenum, unspecified: Secondary | ICD-10-CM | POA: Diagnosis not present

## 2023-04-23 DIAGNOSIS — B3781 Candidal esophagitis: Secondary | ICD-10-CM | POA: Diagnosis not present

## 2023-04-23 DIAGNOSIS — K2289 Other specified disease of esophagus: Secondary | ICD-10-CM | POA: Diagnosis not present

## 2023-04-25 DIAGNOSIS — K219 Gastro-esophageal reflux disease without esophagitis: Secondary | ICD-10-CM | POA: Diagnosis not present

## 2023-04-25 DIAGNOSIS — N1831 Chronic kidney disease, stage 3a: Secondary | ICD-10-CM | POA: Diagnosis not present

## 2023-04-25 DIAGNOSIS — R7303 Prediabetes: Secondary | ICD-10-CM | POA: Diagnosis not present

## 2023-04-25 DIAGNOSIS — R2681 Unsteadiness on feet: Secondary | ICD-10-CM | POA: Diagnosis not present

## 2023-04-25 DIAGNOSIS — F5101 Primary insomnia: Secondary | ICD-10-CM | POA: Diagnosis not present

## 2023-04-25 DIAGNOSIS — F330 Major depressive disorder, recurrent, mild: Secondary | ICD-10-CM | POA: Diagnosis not present

## 2023-04-26 ENCOUNTER — Other Ambulatory Visit: Payer: Self-pay | Admitting: Internal Medicine

## 2023-04-26 DIAGNOSIS — R2681 Unsteadiness on feet: Secondary | ICD-10-CM

## 2023-04-29 ENCOUNTER — Ambulatory Visit: Payer: Medicare HMO | Admitting: Podiatry

## 2023-04-29 DIAGNOSIS — T148XXA Other injury of unspecified body region, initial encounter: Secondary | ICD-10-CM | POA: Diagnosis not present

## 2023-04-29 DIAGNOSIS — B351 Tinea unguium: Secondary | ICD-10-CM | POA: Diagnosis not present

## 2023-04-29 DIAGNOSIS — L600 Ingrowing nail: Secondary | ICD-10-CM | POA: Diagnosis not present

## 2023-04-29 DIAGNOSIS — M79674 Pain in right toe(s): Secondary | ICD-10-CM

## 2023-04-29 DIAGNOSIS — M79675 Pain in left toe(s): Secondary | ICD-10-CM

## 2023-04-29 MED ORDER — CEPHALEXIN 500 MG PO CAPS: 500.0000 mg | ORAL_CAPSULE | Freq: Three times a day (TID) | ORAL | 0 refills | Status: DC

## 2023-04-29 NOTE — Patient Instructions (Signed)

## 2023-04-29 NOTE — Progress Notes (Unsigned)
Subjective: Chief Complaint  Patient presents with   Nail Problem    Pt states he also wants his nails trimmed if possible   Ingrown Toenail    Right great toe ingrown Pt states he has had it for a couple months and has been soaking it in Epson salt. Pt states he also went to the emergency room and they told him he has a cyst in his ankle bone.   82 year old male presents for above concerns.  He states that having tenderness on the right big toe for last couple months and try some Epsom salts.  No drainage or pus.  Tender with pressure.  He also states he had a rock flew from the lawnmower and hit his lateral leg and he has a small hematoma.  He was seen in emergency room.  He was found to have some cyst formation recommend follow-up with orthopedics.  His nails are also thickened elongated in general causing discomfort not able to trim the other nails as well.  No swelling, redness or any drainage.  Objective: AAO x3, NAD DP/PT pulses palpable bilaterally, CRT less than 3 seconds There is incurvation present to the right hallux toenail medial aspect without any drainage or pus.  Minimal edema.  There is tenderness palpation of the nail border.  In general nails are hypertrophic, dystrophic with yellow, brown discoloration.  No edema, erythema or signs of infection of the other toenails.  There are no open lesions. On the right lateral ankle just proximal to the lateral malleolus is localized hematoma.  There is no erythema warmth or any fluctuation, crepitation or malodor. No pain with calf compression, swelling, warmth, erythema  Assessment: 82 year old male with ingrown toenail right hallux, symptomatic onychomycosis, hematoma right ankle  Plan: -All treatment options discussed with the patient including all alternatives, risks, complications.  -Sharply debrided the nails x 9 without any complications or bleeding. -At this time, the patient is requesting partial nail removal with  chemical matricectomy to the symptomatic portion of the nail. Risks and complications were discussed with the patient for which they understand and written consent was obtained. Under sterile conditions a total of 3 mL of a mixture of 2% lidocaine plain and 0.5% Marcaine plain was infiltrated in a hallux block fashion. Once anesthetized, the skin was prepped in sterile fashion. A tourniquet was then applied. Next the medial aspect of hallux nail border was then sharply excised making sure to remove the entire offending nail border. Once the nails were ensured to be removed area was debrided and the underlying skin was intact. There is no purulence identified in the procedure. Next phenol was then applied under standard conditions and copiously irrigated.  Silvadene was applied. A dry sterile dressing was applied. After application of the dressing the tourniquet was removed and there is found to be an immediate capillary refill time to the digit. The patient tolerated the procedure well any complications. Post procedure instructions were discussed the patient for which he verbally understood. Discussed signs/symptoms of infection and directed to call the office immediately should any occur or go directly to the emergency room. In the meantime, encouraged to call the office with any questions, concerns, changes symptoms. -Reviewed x-rays in the emergency room.  He does have a small hematoma.  Recommend warm compresses and I do think this will eventually resolve on its own.  Discussed compression as well. -Patient encouraged to call the office with any questions, concerns, change in symptoms.   Vivi Barrack  DPM

## 2023-04-30 ENCOUNTER — Other Ambulatory Visit (HOSPITAL_COMMUNITY): Payer: Self-pay | Admitting: Surgery

## 2023-04-30 ENCOUNTER — Ambulatory Visit (HOSPITAL_COMMUNITY)
Admission: RE | Admit: 2023-04-30 | Discharge: 2023-04-30 | Disposition: A | Discharge status: Home / Self Care | Payer: Medicare HMO | Source: Ambulatory Visit | Attending: Surgery | Admitting: Surgery

## 2023-04-30 DIAGNOSIS — R1310 Dysphagia, unspecified: Secondary | ICD-10-CM

## 2023-04-30 DIAGNOSIS — E049 Nontoxic goiter, unspecified: Secondary | ICD-10-CM

## 2023-04-30 MED ORDER — SODIUM CHLORIDE (PF) 0.9 % IJ SOLN
INTRAMUSCULAR | Status: AC
  Filled 2023-04-30: qty 50

## 2023-04-30 MED ORDER — IOHEXOL 300 MG/ML  SOLN
75.0000 mL | Freq: Once | INTRAMUSCULAR | Status: AC | PRN
  Administered 2023-04-30: 75 mL via INTRAVENOUS

## 2023-05-07 ENCOUNTER — Ambulatory Visit (INDEPENDENT_AMBULATORY_CARE_PROVIDER_SITE_OTHER): Payer: Medicare HMO

## 2023-05-07 ENCOUNTER — Ambulatory Visit (HOSPITAL_COMMUNITY)
Admission: EM | Admit: 2023-05-07 | Discharge: 2023-05-07 | Disposition: A | Discharge status: Home / Self Care | Payer: Medicare HMO | Attending: Emergency Medicine | Admitting: Emergency Medicine

## 2023-05-07 ENCOUNTER — Encounter (HOSPITAL_COMMUNITY): Payer: Self-pay

## 2023-05-07 DIAGNOSIS — K219 Gastro-esophageal reflux disease without esophagitis: Secondary | ICD-10-CM

## 2023-05-07 DIAGNOSIS — L600 Ingrowing nail: Secondary | ICD-10-CM

## 2023-05-07 DIAGNOSIS — R0789 Other chest pain: Secondary | ICD-10-CM

## 2023-05-07 DIAGNOSIS — R079 Chest pain, unspecified: Secondary | ICD-10-CM | POA: Diagnosis not present

## 2023-05-07 MED ORDER — ESOMEPRAZOLE MAGNESIUM 40 MG PO CPDR: 40.0000 mg | DELAYED_RELEASE_CAPSULE | Freq: Every day | ORAL | 0 refills | Status: DC

## 2023-05-07 NOTE — ED Triage Notes (Addendum)
Patient c/o upper back pain that hurts worse when he turns from side to side.  Patient's wife wrote a note for him and states he also has low back pain.  Note also says he has been having intermittent chest tightness, but is unable to say when it started. Patient is a poor historian. Patient began talking about a scar on his neck and states that when he swallows he feels tightness and states it is not his chest, but his throat.  Note states that he is c/o his left great toe throbbing following having an ingrown toenail removed 8 days ago.

## 2023-05-07 NOTE — ED Provider Notes (Signed)
HPI  SUBJECTIVE:  Randy Merritt is a 82 y.o. male with multiple issues: First, he presents with bilateral posterior thoracic pain worse on the right for the past week.  It is sharp, intermittent, present only with mucus.  He reports a cough.  He is not sure if it is productive or not.  No fevers, wheezing, shortness of breath, chest pain, nasal congestion, postnasal drip, sinus pain or pressure, dyspnea on exertion, belching, waterbrash, change in physical activity, trauma to the chest.  He reports allergy symptoms, but is not on any medications.  He reports constant substernal chest tightness and difficulty swallowing that is worse with lying down.  It is not associated with p.o. intake or exertion.  He tried Tylenol without improvement in his symptoms.  Symptoms are worse with torso rotation, turning over in bed or with deep inspiration.  No surgery in the past 4 weeks, recent immobilization, cancer, hemoptysis.  He has had similar symptoms before.  Patient was seen by cardiology in May 2023 for chest pressure and shortness of breath.  Exercise nuclear stress test and echo ordered.  Do not see any records of this.  He is supposed to have an EGD in the next month or 2.  Second, patient had an ingrown toenail of his right great toe removed on 6/17 by podiatry.  Patient would like for me to have a look at this.  He denies worsening pain.  He is currently on an unknown antibiotic for this.  Chart review shows that is Keflex  Patient has a past medical history of dysphagia, GERD on Pepcid, enlarged thyroid, hypercholesterolemia, hypertension, seasonal allergies, atypical chest pain.  No history of diabetes, pulmonary disease, smoking, MI, CVA/TIA.  Patient is a poor historian.  PCP: Patient unable to tell me who his PCP is.  Past Medical History:  Diagnosis Date   Acid reflux    Anxiety    Arthritis    Depression    " a little"   Goiter    High cholesterol    History of blood transfusion     Perforated bowel (HCC)    Seasonal allergies     Past Surgical History:  Procedure Laterality Date   ANTERIOR CERVICAL DECOMP/DISCECTOMY FUSION N/A 04/08/2013   Procedure: ANTERIOR CERVICAL DECOMPRESSION/DISCECTOMY FUSION 1 LEVEL;  Surgeon: Karn Cassis, MD;  Location: [facility];  Service: Neurosurgery;  Laterality: N/A;  Cervical five-six Anterior cervical decompression/diskectomy fusion   APPENDECTOMY N/A 10/06/2019   Procedure: APPENDECTOMY;  Surgeon: Berna Bue, MD;  Location: [facility] OR;  Service: General;  Laterality: N/A;   BIOPSY  01/30/2019   Procedure: BIOPSY;  Surgeon: Jeani Hawking, MD;  Location: [facility];  Service: Endoscopy;;   CERVICAL DISCECTOMY     x2   CHOLECYSTECTOMY     COLON RESECTION SIGMOID N/A 10/06/2019   Procedure: COLON RESECTION SIGMOID;  Surgeon: Berna Bue, MD;  Location: [facility] OR;  Service: General;  Laterality: N/A;   ESOPHAGEAL DILATION  01/30/2019   Procedure: ESOPHAGEAL DILATION;  Surgeon: Jeani Hawking, MD;  Location: [facility];  Service: Endoscopy;;  [place]   ESOPHAGOGASTRODUODENOSCOPY (EGD) WITH PROPOFOL N/A 01/30/2019   Procedure: ESOPHAGOGASTRODUODENOSCOPY (EGD) WITH PROPOFOL;  Surgeon: Jeani Hawking, MD;  Location: [facility];  Service: Endoscopy;  Laterality: N/A;   gallstone removal     LAPAROTOMY N/A 10/06/2019   Procedure: EXPLORATORY LAPAROTOMY;  Surgeon: Berna Bue, MD;  Location: [facility] OR;  Service: General;  Laterality: N/A;    Family History  Problem Relation Age of Onset   Healthy Mother    Healthy Father    Hypertension Sister    Hypertension Sister    Diabetes Sister    Hypertension Brother    Thyroid disease Neg Hx     Social History   Tobacco Use   Smoking status: Former    Packs/day: 0.25    Years: 1.00    Additional pack years: 0.00    Total pack years: 0.25    Types: Cigarettes    Quit date: 1960    Years since quitting: 64.5   Smokeless tobacco: Never  Vaping Use   Vaping Use: Never  used  Substance Use Topics   Alcohol use: No   Drug use: No    No current facility-administered medications for this encounter.  Current Outpatient Medications:    aspirin 81 MG chewable tablet, Chew 81 mg by mouth daily., Disp: , Rfl:    clonazePAM (KLONOPIN) 0.5 MG tablet, Take 0.5 mg by mouth. prn, Disp: , Rfl:    finasteride (PROSCAR) 5 MG tablet, Take 5 mg by mouth daily., Disp: , Rfl:    acetaminophen (TYLENOL) 500 MG tablet, Take 2 tablets (1,000 mg total) by mouth every 8 (eight) hours. (Patient taking differently: Take 1,000 mg by mouth every 8 (eight) hours as needed. pain), Disp: 30 tablet, Rfl: 0   cephALEXin (KEFLEX) 500 MG capsule, Take 1 capsule (500 mg total) by mouth 3 (three) times daily., Disp: 21 capsule, Rfl: 0   esomeprazole (NEXIUM) 40 MG capsule, Take 1 capsule (40 mg total) by mouth daily., Disp: 30 capsule, Rfl: 0   famotidine (PEPCID) 20 MG tablet, Take 1 tablet (20 mg total) by mouth 2 (two) times daily., Disp: 30 tablet, Rfl: 0   metoprolol succinate (TOPROL-XL) 25 MG 24 hr tablet, Take 25 mg by mouth daily., Disp: , Rfl:    Multiple Vitamins-Iron (MULTIVITAMINS WITH IRON) TABS tablet, You should get a Women's One a day vitamin with iron and take daily.  This will help with your low hemoglobin.  You can buy this at any drug store, and choose whatever brand you like. (Patient taking differently: Take 1 tablet by mouth daily.), Disp: , Rfl: 0   Rosuvastatin Calcium 10 MG CPSP, Take 10 mg by mouth daily., Disp: , Rfl:    timolol (TIMOPTIC) 0.5 % ophthalmic solution, Place 1 drop into both eyes 2 (two) times daily., Disp: , Rfl:   Allergies  Allergen Reactions   Alprazolam Other (See Comments)   Cyclobenzaprine Other (See Comments)   Prednisone Palpitations and Other (See Comments)     ROS  As noted in HPI.   Physical Exam  BP (!) 145/83 (BP Location: Left Arm)   Pulse 79   Temp 98.1 F (36.7 C) (Oral)   Resp 16   SpO2 99%   Constitutional: Well  developed, well nourished, no acute distress Eyes:  EOMI, conjunctiva normal bilaterally HENT: Normocephalic, atraumatic,mucus membranes moist.  No drooling Respiratory: Normal inspiratory effort, lungs clear bilaterally.  Mild sternal chest wall tenderness.  No other chest wall tenderness.  Good air movement. Cardiovascular: Normal rate, regular rhythm, no murmurs rubs or gallops GI: nondistended skin: No rash, skin intact Musculoskeletal: Calves symmetric, nontender, no edema Right first toe: Postoperative changes.  No surrounding erythema, edema, tenderness, or expressible purulent drainage. Neurologic: Alert & oriented x 3, no focal neuro deficits Psychiatric: Speech and behavior appropriate   ED Course   Medications - No data to display  Orders Placed This Encounter  Procedures   DG Chest 2 View    Standing Status:   Standing    Number of Occurrences:   1    Order Specific Question:   Reason for Exam (SYMPTOM  OR DIAGNOSIS REQUIRED)    Answer:   Bilateral thoracic pain rule out pneumonia, pneumothorax, pleural effusion   ED EKG    Difficulty swallowing    Standing Status:   Standing    Number of Occurrences:   1    Order Specific Question:   Reason for Exam    Answer:   Other (See Comments)   EKG 12-Lead    Standing Status:   Standing    Number of Occurrences:   1    No results found for this or any previous visit (from the past 24 hour(s)). DG Chest 2 View  Result Date: 05/07/2023 CLINICAL DATA:  Bilateral thoracic pain rule out pneumonia, pneumothorax, pleural effusion EXAM: CHEST - 2 VIEW COMPARISON:  None Available. FINDINGS: The heart size and mediastinal contours are within normal limits. Both lungs are clear. No visible pleural effusions or pneumothorax. No acute osseous abnormality. Degenerative changes in the imaged thoracolumbar spine. Partially imaged ACDF. IMPRESSION: No active cardiopulmonary disease. Electronically Signed   By: Feliberto Harts M.D.   On:  05/07/2023 09:36    ED Clinical Impression  1. Chest wall pain   2. Gastroesophageal reflux disease, unspecified whether esophagitis present   3. Ingrown toenail      ED Assessment/Plan     Outside records reviewed.  Additional medical history obtained.  As noted in HPI.  Patient is an extremely poor historian.  1.  Thoracic pain, cough, substernal tightness, difficulty swallowing.  Will check chest x-ray to evaluate for pneumonia, pneumothorax, pleural effusion and an EKG.  Doubt PE in the absence of tachycardia, hypoxia, any signs of DVT.  His Wells score is 0.  I suspect the chest tightness/difficulty swallowing is due to his dysphagia rather than ACS.  EKG: Normal sinus rhythm, rate 69.  Normal axis, normal intervals.  Right atrial enlargement.  No ventricular hypertrophy.  No ST-T wave changes.  No change compared EKG from 03/2022.  Patient asymptomatic while EKG was obtained.  History suggestive of musculoskeletal chest wall pain as it is present only with torso rotation/movement, deep inspiration, and it is somewhat reproducible.  Will send home with Tylenol.  Patient states he is unable to take NSAIDs.  Will also add some Nexium to his Pepcid.  Reviewed imaging independently.  Normal chest x-ray.  See radiology report for full details.  2.  Ingrown toenail status post removal by podiatry.  Appears to be appropriately healing.  Continue antibiotics.  Follow-up with podiatry.  Spent over 30 minutes with the patient obtaining H&P, reviewing labs, EKG and discussing MDM/treatment plan with him.  Discussed EKG, imaging, MDM, treatment plan, and plan for follow-up with patient. Discussed sn/sx that should prompt return to the ED. patient agrees with plan.   Meds ordered this encounter  Medications   esomeprazole (NEXIUM) 40 MG capsule    Sig: Take 1 capsule (40 mg total) by mouth daily.    Dispense:  30 capsule    Refill:  0      *This clinic note was created using Administrator, sports. Therefore, there may be occasional mistakes despite careful proofreading.  ?    Domenick Gong, MD 05/07/23 [phone]

## 2023-05-07 NOTE — Discharge Instructions (Signed)
Your chest x-ray and EKG were normal.  I suspect your pain is musculoskeletal.  Take Tylenol 1000 mg 3-4 times a day as needed for pain.  I suspect your chest tightness/difficulty swallowing is from your acid reflux.  I am refilling your Nexium.  Take this with the Pepcid that you are currently on.

## 2023-05-08 DIAGNOSIS — R1084 Generalized abdominal pain: Secondary | ICD-10-CM | POA: Diagnosis not present

## 2023-05-08 DIAGNOSIS — N281 Cyst of kidney, acquired: Secondary | ICD-10-CM | POA: Diagnosis not present

## 2023-05-09 ENCOUNTER — Encounter: Payer: Self-pay | Admitting: Internal Medicine

## 2023-05-13 ENCOUNTER — Ambulatory Visit
Admission: RE | Admit: 2023-05-13 | Discharge: 2023-05-13 | Disposition: A | Discharge status: Home / Self Care | Payer: Medicare HMO | Source: Ambulatory Visit | Attending: Internal Medicine | Admitting: Internal Medicine

## 2023-05-13 DIAGNOSIS — M545 Low back pain, unspecified: Secondary | ICD-10-CM | POA: Diagnosis not present

## 2023-05-13 DIAGNOSIS — R410 Disorientation, unspecified: Secondary | ICD-10-CM | POA: Diagnosis not present

## 2023-05-13 DIAGNOSIS — R1310 Dysphagia, unspecified: Secondary | ICD-10-CM | POA: Diagnosis not present

## 2023-05-13 DIAGNOSIS — R2681 Unsteadiness on feet: Secondary | ICD-10-CM | POA: Diagnosis not present

## 2023-05-13 DIAGNOSIS — R4182 Altered mental status, unspecified: Secondary | ICD-10-CM | POA: Diagnosis not present

## 2023-05-13 DIAGNOSIS — R634 Abnormal weight loss: Secondary | ICD-10-CM | POA: Diagnosis not present

## 2023-05-20 ENCOUNTER — Ambulatory Visit: Payer: Medicare HMO | Admitting: Podiatry

## 2023-05-20 DIAGNOSIS — M19041 Primary osteoarthritis, right hand: Secondary | ICD-10-CM | POA: Diagnosis not present

## 2023-05-20 DIAGNOSIS — F419 Anxiety disorder, unspecified: Secondary | ICD-10-CM | POA: Diagnosis not present

## 2023-05-21 ENCOUNTER — Emergency Department (HOSPITAL_COMMUNITY)
Admission: EM | Admit: 2023-05-21 | Discharge: 2023-05-21 | Disposition: A | Discharge status: Home / Self Care | Payer: Medicare HMO | Attending: Emergency Medicine | Admitting: Emergency Medicine

## 2023-05-21 ENCOUNTER — Encounter (HOSPITAL_COMMUNITY): Payer: Self-pay

## 2023-05-21 ENCOUNTER — Emergency Department (HOSPITAL_COMMUNITY): Payer: Medicare HMO

## 2023-05-21 DIAGNOSIS — R079 Chest pain, unspecified: Secondary | ICD-10-CM

## 2023-05-21 DIAGNOSIS — J398 Other specified diseases of upper respiratory tract: Secondary | ICD-10-CM | POA: Diagnosis not present

## 2023-05-21 DIAGNOSIS — R1310 Dysphagia, unspecified: Secondary | ICD-10-CM | POA: Diagnosis not present

## 2023-05-21 DIAGNOSIS — K5900 Constipation, unspecified: Secondary | ICD-10-CM | POA: Diagnosis not present

## 2023-05-21 DIAGNOSIS — R110 Nausea: Secondary | ICD-10-CM | POA: Insufficient documentation

## 2023-05-21 DIAGNOSIS — Z7982 Long term (current) use of aspirin: Secondary | ICD-10-CM | POA: Diagnosis not present

## 2023-05-21 DIAGNOSIS — R0789 Other chest pain: Secondary | ICD-10-CM | POA: Insufficient documentation

## 2023-05-21 DIAGNOSIS — R1030 Lower abdominal pain, unspecified: Secondary | ICD-10-CM | POA: Diagnosis not present

## 2023-05-21 DIAGNOSIS — K5909 Other constipation: Secondary | ICD-10-CM

## 2023-05-21 LAB — URINALYSIS, ROUTINE W REFLEX MICROSCOPIC
Bilirubin Urine: NEGATIVE
Glucose, UA: NEGATIVE mg/dL
Hgb urine dipstick: NEGATIVE
Ketones, ur: NEGATIVE mg/dL
Leukocytes,Ua: NEGATIVE
Nitrite: NEGATIVE
Protein, ur: NEGATIVE mg/dL
Specific Gravity, Urine: 1.008 (ref 1.005–1.030)
pH: 7 (ref 5.0–8.0)

## 2023-05-21 LAB — CBC
HCT: 39.1 % (ref 39.0–52.0)
Hemoglobin: 12.9 g/dL — ABNORMAL LOW (ref 13.0–17.0)
MCH: 31.7 pg (ref 26.0–34.0)
MCHC: 33 g/dL (ref 30.0–36.0)
MCV: 96.1 fL (ref 80.0–100.0)
Platelets: 154 10*3/uL (ref 150–400)
RBC: 4.07 MIL/uL — ABNORMAL LOW (ref 4.22–5.81)
RDW: 12.4 % (ref 11.5–15.5)
WBC: 3.4 10*3/uL — ABNORMAL LOW (ref 4.0–10.5)
nRBC: 0 % (ref 0.0–0.2)

## 2023-05-21 LAB — COMPREHENSIVE METABOLIC PANEL
ALT: 27 U/L (ref 0–44)
AST: 26 U/L (ref 15–41)
Albumin: 4.3 g/dL (ref 3.5–5.0)
Alkaline Phosphatase: 102 U/L (ref 38–126)
Anion gap: 8 (ref 5–15)
BUN: 16 mg/dL (ref 8–23)
CO2: 27 mmol/L (ref 22–32)
Calcium: 9.1 mg/dL (ref 8.9–10.3)
Chloride: 105 mmol/L (ref 98–111)
Creatinine, Ser: 1.46 mg/dL — ABNORMAL HIGH (ref 0.61–1.24)
GFR, Estimated: 48 mL/min — ABNORMAL LOW (ref >60)
Glucose, Bld: 112 mg/dL — ABNORMAL HIGH (ref 70–99)
Potassium: 4.1 mmol/L (ref 3.5–5.1)
Sodium: 140 mmol/L (ref 135–145)
Total Bilirubin: 1 mg/dL (ref 0.3–1.2)
Total Protein: 7 g/dL (ref 6.5–8.1)

## 2023-05-21 LAB — TROPONIN I (HIGH SENSITIVITY): Troponin I (High Sensitivity): 7 ng/L (ref <18)

## 2023-05-21 LAB — LIPASE, BLOOD: Lipase: 27 U/L (ref 11–51)

## 2023-05-21 MED ORDER — GOLYTELY 236 G PO SOLR: 4000.0000 mL | Freq: Once | ORAL | 0 refills | Status: AC

## 2023-05-21 MED ORDER — GOLYTELY 236 G PO SOLR: 4000.0000 mL | Freq: Once | ORAL | 0 refills | Status: DC

## 2023-05-21 NOTE — Discharge Instructions (Signed)
Take Golytly for constipation. Attend outpatient ENT appointment next month to evaluate dysphagia.

## 2023-05-21 NOTE — ED Notes (Signed)
Pt to registration window complaining of Chest pain and neck pain. Pulled pt to triage to perform EKG. Triage RN notified

## 2023-05-21 NOTE — ED Provider Notes (Signed)
[place] EMERGENCY DEPARTMENT AT [facility] Provider Note   CSN: [id number] Arrival date & time: 05/21/23  5784     History  Chief Complaint  Patient presents with   Nausea    Daison Braxton is a 82 y.o. male.  Patient is a pleasant 82 year old gentleman with history significant for dysphagia, presenting with chest tightness and abdominal discomfort. Patient reports a feeling of lower abdominal fullness and discomfort. States he has been urinating fine but hasn't had a bowel movement in a couple weeks. Additionally reports chest tightness and an inability to take a deep breath. Patient is unsure when the pain started, and has been seen in the ED previously for similar complaint. Patient's main concern is for his difficulty swallowing and feeling that his neck is swollen. Again patient is unsure when his dysphagia began. Denies chest pain at rest, dysuria, shortness of breath.     Home Medications Prior to Admission medications   Medication Sig Start Date End Date Taking? Authorizing Provider  acetaminophen (TYLENOL) 500 MG tablet Take 2 tablets (1,000 mg total) by mouth every 8 (eight) hours. Patient taking differently: Take 1,000 mg by mouth every 8 (eight) hours as needed. pain 10/15/19   Sherrie George, PA-C  aspirin 81 MG chewable tablet Chew 81 mg by mouth daily.    [provider]  cephALEXin (KEFLEX) 500 MG capsule Take 1 capsule (500 mg total) by mouth 3 (three) times daily. 04/29/23   Vivi Barrack, DPM  clonazePAM (KLONOPIN) 0.5 MG tablet Take 0.5 mg by mouth. prn    [provider]  esomeprazole (NEXIUM) 40 MG capsule Take 1 capsule (40 mg total) by mouth daily. 05/07/23   Domenick Gong, MD  famotidine (PEPCID) 20 MG tablet Take 1 tablet (20 mg total) by mouth 2 (two) times daily. 03/30/22   Kommor, [place], MD  finasteride (PROSCAR) 5 MG tablet Take 5 mg by mouth daily.    [provider]  metoprolol succinate (TOPROL-XL) 25  MG 24 hr tablet Take 25 mg by mouth daily. 07/20/21   [provider]  Multiple Vitamins-Iron (MULTIVITAMINS WITH IRON) TABS tablet You should get a Women's One a day vitamin with iron and take daily.  This will help with your low hemoglobin.  You can buy this at any drug store, and choose whatever brand you like. Patient taking differently: Take 1 tablet by mouth daily. 10/15/19   Sherrie George, PA-C  Rosuvastatin Calcium 10 MG CPSP Take 10 mg by mouth daily. 05/26/21   [provider]  timolol (TIMOPTIC) 0.5 % ophthalmic solution Place 1 drop into both eyes 2 (two) times daily. 11/23/20   [provider]      Allergies    Alprazolam, Cyclobenzaprine, and Prednisone    Review of Systems   Review of Systems  Constitutional:  Negative for fever.  Respiratory:  Negative for shortness of breath.   Cardiovascular:  Positive for chest pain.  Gastrointestinal:  Positive for abdominal pain and constipation.  Genitourinary:  Negative for dysuria.    Physical Exam Updated Vital Signs BP (!) 142/85 (BP Location: Left Arm)   Pulse 73   Temp (!) 97.4 F (36.3 C) (Oral)   Resp 18   Ht 5\' 8"  (1.727 m)   Wt 68 kg   SpO2 100%   BMI 22.81 kg/m  Physical Exam Constitutional:      Appearance: He is not ill-appearing.  Pulmonary:     Effort: Pulmonary effort is  normal.     Breath sounds: Normal breath sounds.  Abdominal:     General: There is no distension.     Palpations: Abdomen is soft.     Tenderness: There is no abdominal tenderness.  Skin:    General: Skin is warm and dry.  Neurological:     General: No focal deficit present.     Mental Status: He is alert and oriented to person, place, and time.     ED Results / Procedures / Treatments   Labs (all labs ordered are listed, but only abnormal results are displayed) Labs Reviewed  COMPREHENSIVE METABOLIC PANEL - Abnormal; Notable for the following components:      Result Value   Glucose, Bld 112 (*)     Creatinine, Ser 1.46 (*)    GFR, Estimated 48 (*)    All other components within normal limits  CBC - Abnormal; Notable for the following components:   WBC 3.4 (*)    RBC 4.07 (*)    Hemoglobin 12.9 (*)    All other components within normal limits  LIPASE, BLOOD  URINALYSIS, ROUTINE W REFLEX MICROSCOPIC  TROPONIN I (HIGH SENSITIVITY)  TROPONIN I (HIGH SENSITIVITY)  TROPONIN I (HIGH SENSITIVITY)    EKG EKG Interpretation Date/Time:  Tuesday May 21 2023 11:48:18 EDT Ventricular Rate:  72 PR Interval:  147 QRS Duration:  76 QT Interval:  380 QTC Calculation: 416 R Axis:   79  Text Interpretation: Sinus rhythm Right atrial enlargement No significant change since last tracing Confirmed by Richardean Canal [phone]) on 05/21/2023 4:03:31 PM  Radiology DG Chest Portable 1 View  Result Date: 05/21/2023 CLINICAL DATA:  Mucous. EXAM: PORTABLE CHEST 1 VIEW COMPARISON:  Chest radiograph 05/07/2023.  Neck CT 04/30/2023. FINDINGS: Clear lungs. Unchanged mild mass effect on the right aspect of the cervical trachea, consistent with known right thyromegaly. Normal heart size and mediastinal contours. No pleural effusion or pneumothorax. Visualized bones and upper abdomen are unremarkable. IMPRESSION: No evidence of acute cardiopulmonary disease. Unchanged mild mass effect on the right aspect of the cervical trachea, consistent with known right thyromegaly. Electronically Signed   By: Orvan Falconer M.D.   On: 05/21/2023 14:50    Procedures Procedures    Medications Ordered in ED Medications - No data to display  ED Course/ Medical Decision Making/ A&P                             Medical Decision Making Amount and/or Complexity of Data Reviewed Labs: ordered.  Medical Decision Making:   Jakarius Flamenco is a 82 y.o. male who presented to the ED today with chest pain, abdominal pain detailed above.     Complete initial physical exam performed, notably the patient  was well-appearing.     Reviewed and confirmed nursing documentation for past medical history, family history, social history.    Initial Assessment:   With the patient's presentation of chest pain, most likely diagnosis is dysphagia. Other diagnoses were considered including (but not limited to) ACS, PE, pneumonia, GERD. These are considered less likely due to history of present illness and physical exam findings.    Initial Plan:   Screening labs including CBC and Metabolic panel to evaluate for infectious or metabolic etiology of disease.  Urinalysis with reflex culture ordered to evaluate for UTI or relevant urologic/nephrologic pathology.  CXR to evaluate for structural/infectious intrathoracic pathology.  EKG to evaluate for cardiac pathology Objective evaluation  as below reviewed   Initial Study Results:   Laboratory  All laboratory results reviewed without evidence of clinically relevant pathology.    EKG EKG was reviewed independently. Rate, rhythm, axis, intervals all examined and without medically relevant abnormality. ST segments without concerns for elevations.    Radiology:  All images reviewed independently. Agree with radiology report at this time.   DG Chest Portable 1 View  Result Date: 05/21/2023 CLINICAL DATA:  Mucous. EXAM: PORTABLE CHEST 1 VIEW COMPARISON:  Chest radiograph 05/07/2023.  Neck CT 04/30/2023. FINDINGS: Clear lungs. Unchanged mild mass effect on the right aspect of the cervical trachea, consistent with known right thyromegaly. Normal heart size and mediastinal contours. No pleural effusion or pneumothorax. Visualized bones and upper abdomen are unremarkable. IMPRESSION: No evidence of acute cardiopulmonary disease. Unchanged mild mass effect on the right aspect of the cervical trachea, consistent with known right thyromegaly. Electronically Signed   By: Orvan Falconer M.D.   On: 05/21/2023 14:50   MR BRAIN WO CONTRAST  Result Date: 05/20/2023 CLINICAL DATA:  Gait instability.  Altered mental status, confusion and hearing loss. EXAM: MRI HEAD WITHOUT CONTRAST TECHNIQUE: Multiplanar, multiecho pulse sequences of the brain and surrounding structures were obtained without intravenous contrast. COMPARISON:  MRI brain 06/30/2020. FINDINGS: Brain: No acute infarct or hemorrhage. Unchanged moderate chronic small-vessel disease and generalized volume loss with slight left temporal predominance. No mass or midline shift. No hydrocephalus or extra-axial collection. No abnormal susceptibility. Vascular: Normal flow voids. Skull and upper cervical spine: Normal marrow signal. Sinuses/Orbits: Unremarkable. Other: None. IMPRESSION: 1. No acute intracranial abnormality or mass. 2. Unchanged moderate chronic small-vessel disease and generalized volume loss with slight left temporal predominance. Electronically Signed   By: Orvan Falconer M.D.   On: 05/20/2023 07:43   CT SOFT TISSUE NECK W CONTRAST  Result Date: 05/08/2023 CLINICAL DATA:  82 year old male with dysphagia. Globus sensation. Prior ACDF. Thyromegaly. EXAM: CT NECK WITH CONTRAST TECHNIQUE: Multidetector CT imaging of the neck was performed using the standard protocol following the bolus administration of intravenous contrast. RADIATION DOSE REDUCTION: This exam was performed according to the departmental dose-optimization program which includes automated exposure control, adjustment of the mA and/or kV according to patient size and/or use of iterative reconstruction technique. CONTRAST:  75mL OMNIPAQUE IOHEXOL 300 MG/ML  SOLN COMPARISON:  Neck CT 07/08/2021. Thyroid ultrasound most recently 10/17/2022. FINDINGS: Pharynx and larynx: Motion artifact at the hypopharynx, supraglottic larynx. But laryngeal and pharyngeal soft tissue contours seem to remain stable and within normal limits. Negative parapharyngeal and retropharyngeal spaces. Salivary glands: Stable and negative when allowing for some submandibular gland and sublingual motion  artifact. Thyroid: Bulky chronic thyroid goiter. Heterogeneously enlarged right lobe measures up to 50 by 45 x 86 mm (AP by transverse by CC) (estimated right lobe volume 97 mL. Lesser heterogeneous left thyroid lobe enlargement is 30 x 26 x 54 mm. The gland size and configuration does not appear significantly changed since 2022, with mild retrosternal extension on the right as before. Mild chronic mass effect on the subglottic trachea. No significant airway narrowing. No adjacent soft tissue plane invasion. Lymph nodes: Negative.  No cervical lymphadenopathy. Vascular: Major vascular structures in the neck and at the skull base are patent. Mild lateral displacement of the carotid spaces due to thyroid enlargement. Tortuous ICAs below the skull base. Limited intracranial: Negative. Visualized orbits: Negative. Mastoids and visualized paranasal sinuses: Visualized paranasal sinuses and mastoids are clear. Skeleton: Appearance of chronic ACDF superimposed on congenital C3-C4 incomplete  cervical spine segmentation. Bulky anterior cervical endplate osteophytes at the other levels suggesting Diffuse idiopathic skeletal hyperostosis (DISH). C5-C6 ACDF hardware. Solid ankylosis and/or arthrodesis C3-C4 her, C5-C6, C6-C7. Superimposed bulky bilateral stylohyoid ligament calcification, greater on the right. No acute osseous abnormality identified. Upper chest: Negative aside from the thyroid enlargement as described above. IMPRESSION: 1. Bulky Chronic Thyroid Goiter without significant change since 2022. Right lobe volume alone estimated at 97 mL. Chronic mild regional mass effect. This has been evaluated on previous imaging. (ref: J Am Coll Radiol. 2015 Feb;12(2): 143-50). 2. 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 Multilevel bulky anterior endplate osteophytosis, multilevel cervical ankylosis/arthrodesis. 3. 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 Electronically Signed   By: Odessa Fleming M.D.   On: 05/08/2023 08:43   DG Chest 2 View  Result Date: 05/07/2023 CLINICAL DATA:  Bilateral thoracic pain rule out pneumonia, pneumothorax, pleural effusion EXAM: CHEST - 2 VIEW COMPARISON:  None Available. FINDINGS: The heart size and mediastinal contours are within normal limits. Both lungs are clear. No visible pleural effusions or pneumothorax. No acute osseous abnormality. Degenerative changes in the imaged thoracolumbar spine. Partially imaged ACDF. IMPRESSION: No active cardiopulmonary disease. Electronically Signed   By: Feliberto Harts M.D.   On: 05/07/2023 09:36    Reassessment and Plan:   Patient is a 82 year old man with history significant for dysphagia, presenting with chest and abdominal pain. Physical exam shows a nontoxic, ambulating man in no distress. Vitals were stable throughout the patient's stay in the ED. All labs and imaging were unremarkable and showed no evidence of acute pathology. Chart review shows extensive workup with outpatient providers and previous ED visits for similar complaints. Phone call with patient's wife revealed previous normal endoscopy with therapeutic dilation, and an appointment with ENT next month. Patient was advised to take Golytly solution for constipation and abdominal pain, and to attend ENT appointment for evaluation of dysphagia. Patient agreed with plan to be discharged.          Final Clinical Impression(s) / ED Diagnoses Final diagnoses:  None    Rx / DC Orders ED Discharge Orders     None         Monna Fam, MD 05/21/23 1700    Charlynne Pander, MD 05/21/23 [phone]

## 2023-05-21 NOTE — ED Triage Notes (Addendum)
Pt arrived via POV, c/o continued nausea, coughing/spitting up. Hx of GERD and dysphagia. States this has been ongoing.

## 2023-05-21 NOTE — ED Provider Triage Note (Signed)
Emergency Medicine Provider Triage Evaluation Note  Randy Merritt , a 82 y.o. male  was evaluated in triage.  Pt complains of mucous in his throat that makes it hard to talk. Reports chest tightness that makes it hard for him to breath. Symptoms have been going on for a couple weeks but have gotten worse today. Reports weight loss. Denies any recent plane or car rides, calf pain or swelling.   Review of Systems  Positive: As above Negative: As above  Physical Exam  BP 132/80 (BP Location: Left Arm)   Pulse 79   Temp (!) 97.5 F (36.4 C) (Oral)   Resp 18   Ht 5\' 8"  (1.727 m)   Wt 68 kg   SpO2 100%   BMI 22.81 kg/m  Gen:   Awake, appears uncomfortable. Talking in short sentences Resp:  Short breaths MSK:   Moves extremities without difficulty    Medical Decision Making  Medically screening exam initiated at 12:25 PM.  Appropriate orders placed.  Randy Merritt was informed that the remainder of the evaluation will be completed by another provider, this initial triage assessment does not replace that evaluation, and the importance of remaining in the ED until their evaluation is complete.     Arabella Merles, PA-C 05/21/23 1236

## 2023-05-23 DIAGNOSIS — E042 Nontoxic multinodular goiter: Secondary | ICD-10-CM | POA: Diagnosis not present

## 2023-05-23 DIAGNOSIS — N1831 Chronic kidney disease, stage 3a: Secondary | ICD-10-CM | POA: Diagnosis not present

## 2023-05-23 DIAGNOSIS — F320 Major depressive disorder, single episode, mild: Secondary | ICD-10-CM | POA: Diagnosis not present

## 2023-05-23 DIAGNOSIS — K5901 Slow transit constipation: Secondary | ICD-10-CM | POA: Diagnosis not present

## 2023-05-23 DIAGNOSIS — Z8673 Personal history of transient ischemic attack (TIA), and cerebral infarction without residual deficits: Secondary | ICD-10-CM | POA: Diagnosis not present

## 2023-05-23 DIAGNOSIS — I10 Essential (primary) hypertension: Secondary | ICD-10-CM | POA: Diagnosis not present

## 2023-05-23 DIAGNOSIS — F411 Generalized anxiety disorder: Secondary | ICD-10-CM | POA: Diagnosis not present

## 2023-05-30 DIAGNOSIS — F411 Generalized anxiety disorder: Secondary | ICD-10-CM | POA: Diagnosis not present

## 2023-05-30 DIAGNOSIS — F5101 Primary insomnia: Secondary | ICD-10-CM | POA: Diagnosis not present

## 2023-06-03 ENCOUNTER — Emergency Department (HOSPITAL_COMMUNITY): Payer: Medicare HMO

## 2023-06-03 ENCOUNTER — Ambulatory Visit: Payer: Medicare HMO | Admitting: Podiatry

## 2023-06-03 ENCOUNTER — Inpatient Hospital Stay (HOSPITAL_COMMUNITY)
Admission: EM | Admit: 2023-06-03 | Discharge: 2023-06-11 | DRG: 392 | Disposition: A | Discharge status: Home / Self Care | Payer: Medicare HMO | Attending: Internal Medicine | Admitting: Internal Medicine

## 2023-06-03 ENCOUNTER — Other Ambulatory Visit: Payer: Self-pay

## 2023-06-03 ENCOUNTER — Encounter (HOSPITAL_COMMUNITY): Payer: Self-pay

## 2023-06-03 ENCOUNTER — Encounter: Payer: Self-pay | Admitting: Podiatry

## 2023-06-03 VITALS — BP 142/77 | HR 72

## 2023-06-03 DIAGNOSIS — I129 Hypertensive chronic kidney disease with stage 1 through stage 4 chronic kidney disease, or unspecified chronic kidney disease: Secondary | ICD-10-CM | POA: Diagnosis present

## 2023-06-03 DIAGNOSIS — M4722 Other spondylosis with radiculopathy, cervical region: Secondary | ICD-10-CM | POA: Diagnosis present

## 2023-06-03 DIAGNOSIS — R0789 Other chest pain: Secondary | ICD-10-CM | POA: Diagnosis not present

## 2023-06-03 DIAGNOSIS — Z8249 Family history of ischemic heart disease and other diseases of the circulatory system: Secondary | ICD-10-CM | POA: Diagnosis not present

## 2023-06-03 DIAGNOSIS — R413 Other amnesia: Secondary | ICD-10-CM | POA: Diagnosis not present

## 2023-06-03 DIAGNOSIS — Z7982 Long term (current) use of aspirin: Secondary | ICD-10-CM

## 2023-06-03 DIAGNOSIS — Z888 Allergy status to other drugs, medicaments and biological substances status: Secondary | ICD-10-CM | POA: Diagnosis not present

## 2023-06-03 DIAGNOSIS — G4700 Insomnia, unspecified: Secondary | ICD-10-CM | POA: Diagnosis present

## 2023-06-03 DIAGNOSIS — Z6822 Body mass index (BMI) 22.0-22.9, adult: Secondary | ICD-10-CM

## 2023-06-03 DIAGNOSIS — D649 Anemia, unspecified: Secondary | ICD-10-CM | POA: Diagnosis present

## 2023-06-03 DIAGNOSIS — R1312 Dysphagia, oropharyngeal phase: Secondary | ICD-10-CM | POA: Diagnosis not present

## 2023-06-03 DIAGNOSIS — E7800 Pure hypercholesterolemia, unspecified: Secondary | ICD-10-CM | POA: Diagnosis present

## 2023-06-03 DIAGNOSIS — E440 Moderate protein-calorie malnutrition: Secondary | ICD-10-CM | POA: Insufficient documentation

## 2023-06-03 DIAGNOSIS — E042 Nontoxic multinodular goiter: Secondary | ICD-10-CM | POA: Diagnosis not present

## 2023-06-03 DIAGNOSIS — Z79899 Other long term (current) drug therapy: Secondary | ICD-10-CM

## 2023-06-03 DIAGNOSIS — R627 Adult failure to thrive: Secondary | ICD-10-CM | POA: Diagnosis present

## 2023-06-03 DIAGNOSIS — R1310 Dysphagia, unspecified: Principal | ICD-10-CM

## 2023-06-03 DIAGNOSIS — I10 Essential (primary) hypertension: Secondary | ICD-10-CM | POA: Diagnosis not present

## 2023-06-03 DIAGNOSIS — Z981 Arthrodesis status: Secondary | ICD-10-CM

## 2023-06-03 DIAGNOSIS — N183 Chronic kidney disease, stage 3 unspecified: Secondary | ICD-10-CM | POA: Diagnosis present

## 2023-06-03 DIAGNOSIS — F418 Other specified anxiety disorders: Secondary | ICD-10-CM | POA: Diagnosis not present

## 2023-06-03 DIAGNOSIS — N1831 Chronic kidney disease, stage 3a: Secondary | ICD-10-CM | POA: Diagnosis not present

## 2023-06-03 DIAGNOSIS — M79674 Pain in right toe(s): Secondary | ICD-10-CM | POA: Diagnosis not present

## 2023-06-03 DIAGNOSIS — M5412 Radiculopathy, cervical region: Secondary | ICD-10-CM | POA: Diagnosis not present

## 2023-06-03 DIAGNOSIS — M79675 Pain in left toe(s): Secondary | ICD-10-CM

## 2023-06-03 DIAGNOSIS — B351 Tinea unguium: Secondary | ICD-10-CM | POA: Diagnosis not present

## 2023-06-03 DIAGNOSIS — R0602 Shortness of breath: Secondary | ICD-10-CM | POA: Diagnosis not present

## 2023-06-03 DIAGNOSIS — Z87891 Personal history of nicotine dependence: Secondary | ICD-10-CM

## 2023-06-03 DIAGNOSIS — F419 Anxiety disorder, unspecified: Secondary | ICD-10-CM | POA: Diagnosis not present

## 2023-06-03 DIAGNOSIS — G8929 Other chronic pain: Secondary | ICD-10-CM | POA: Diagnosis present

## 2023-06-03 DIAGNOSIS — F32A Depression, unspecified: Secondary | ICD-10-CM | POA: Insufficient documentation

## 2023-06-03 DIAGNOSIS — F4323 Adjustment disorder with mixed anxiety and depressed mood: Secondary | ICD-10-CM | POA: Diagnosis present

## 2023-06-03 DIAGNOSIS — Z833 Family history of diabetes mellitus: Secondary | ICD-10-CM | POA: Diagnosis not present

## 2023-06-03 DIAGNOSIS — K219 Gastro-esophageal reflux disease without esophagitis: Secondary | ICD-10-CM | POA: Diagnosis present

## 2023-06-03 DIAGNOSIS — E040 Nontoxic diffuse goiter: Secondary | ICD-10-CM | POA: Diagnosis not present

## 2023-06-03 LAB — CBC
HCT: 38.9 % — ABNORMAL LOW (ref 39.0–52.0)
Hemoglobin: 12.7 g/dL — ABNORMAL LOW (ref 13.0–17.0)
MCH: 31 pg (ref 26.0–34.0)
MCHC: 32.6 g/dL (ref 30.0–36.0)
MCV: 94.9 fL (ref 80.0–100.0)
Platelets: 152 10*3/uL (ref 150–400)
RBC: 4.1 MIL/uL — ABNORMAL LOW (ref 4.22–5.81)
RDW: 12.6 % (ref 11.5–15.5)
WBC: 2.3 10*3/uL — ABNORMAL LOW (ref 4.0–10.5)
nRBC: 0 % (ref 0.0–0.2)

## 2023-06-03 LAB — BASIC METABOLIC PANEL
Anion gap: 18 — ABNORMAL HIGH (ref 5–15)
BUN: 11 mg/dL (ref 8–23)
CO2: 28 mmol/L (ref 22–32)
Calcium: 10 mg/dL (ref 8.9–10.3)
Chloride: 97 mmol/L — ABNORMAL LOW (ref 98–111)
Creatinine, Ser: 1.3 mg/dL — ABNORMAL HIGH (ref 0.61–1.24)
GFR, Estimated: 55 mL/min — ABNORMAL LOW (ref >60)
Glucose, Bld: 100 mg/dL — ABNORMAL HIGH (ref 70–99)
Potassium: 4.3 mmol/L (ref 3.5–5.1)
Sodium: 143 mmol/L (ref 135–145)

## 2023-06-03 LAB — TROPONIN I (HIGH SENSITIVITY)
Troponin I (High Sensitivity): 13 ng/L (ref <18)
Troponin I (High Sensitivity): 12 ng/L (ref <18)

## 2023-06-03 MED ORDER — ONDANSETRON HCL 4 MG PO TABS
4.0000 mg | ORAL_TABLET | Freq: Four times a day (QID) | ORAL | Status: DC | PRN
  Administered 2023-06-07: 4 mg via ORAL
  Filled 2023-06-03: qty 1

## 2023-06-03 MED ORDER — IOHEXOL 350 MG/ML SOLN
75.0000 mL | Freq: Once | INTRAVENOUS | Status: AC | PRN
  Administered 2023-06-03: 75 mL via INTRAVENOUS

## 2023-06-03 MED ORDER — LACTATED RINGERS IV SOLN: INTRAVENOUS | Status: DC

## 2023-06-03 MED ORDER — ONDANSETRON HCL 4 MG/2ML IJ SOLN: 4.0000 mg | Freq: Four times a day (QID) | INTRAMUSCULAR | Status: DC | PRN

## 2023-06-03 MED ORDER — ENOXAPARIN SODIUM 40 MG/0.4ML IJ SOSY
40.0000 mg | PREFILLED_SYRINGE | INTRAMUSCULAR | Status: DC
  Administered 2023-06-03 – 2023-06-04 (×2): 40 mg via SUBCUTANEOUS
  Filled 2023-06-03 (×2): qty 0.4

## 2023-06-03 NOTE — Consult Note (Signed)
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 Upon review of patient's chart and imaging, the thyroid has remained stable in size since 2022. There is mild tracheal deviation with no obvious esophageal narrowing. However, there is a prominent osteophyte protruding into the esophagus. D/W ED provider, I do not suspect the thyroid to be the predominant source of patient's dysphagia based on lack of change in size of thyroid since 2022 and other findings/history. Patient has longstanding history of dysphagia, and previously had modified barium swallow in January which reported: "SLP questions if pt may have some component of obstructive dysphagia from cervical hardware but also suspect potential component of "tight UES" given trace backflow of liquid observed with swallow." Patient also has history of difficulty with esophagoscopy and need for esophageal dilation.   Recommend re-evaluation with SLP and gastroenterology; and consideration of consultation to spine surgery to evaluate prominent osteophyte.   Patient is established with Dr. Gerrit Friends who he saw in May for discussion of thyroidectomy; will defer decision to pursue surgery this admission to General Surgery. Patient may be a candidate for botox injection of cricopharyngeus, which can be performed by Laryngology in [place].  Thank you for allowing me to participate in the care of this patient. Please do not hesitate to contact me with any questions or concerns.   Laren Boom, DO Otolaryngology [facility] ENT Cell: [phone]

## 2023-06-03 NOTE — Assessment & Plan Note (Addendum)
Unclear if related to goiter or other cause. Looks like he has seen SLP, [facility] with scope just last month (can't see results though), and sounds like he has referral to ENT. NPO IVF EDP consulted ENT who will see here in hospital I sent secure chat to Dr. Levora Angel (conveniently the same gastroenterologist who appears to have seen in office / done EGD last month is on call): he states GI will see in consult in AM Gen surg to see for goiter (See below) SLP eval and treat

## 2023-06-03 NOTE — ED Provider Notes (Signed)
Patient care assumed from previous provider.   Patient care of Randy Merritt is a 82 y.o. male from previous provider. Please see the original provider note from this emergency department encounter for full history and physical.   Course of Care and my assessment at the time of sign out is detailed in the ED Course below.   Clinical Course as of 06/03/23 2137  Mon Jun 03, 2023  1508 CT soft tissue of neck... awaiting read of CT Neck. Reduced PO over last week. Not handling secretions now.  [BB]    Clinical Course User Index [BB] Fayrene Helper, MD    Reviewed the CT came back showed no significant change in size of the thyroid masses.  ENT was paged and they discussed the possibility of operative management, however requested medicine admit.  I discussed with medicine and they wished me to speak with general surgery, and I spoke to them as well.  He did undergo GI procedure with [facility] in June, though I am unable to see the results.  They have agreed to see the patient tomorrow.  Hospitalist graciously agreed to admit the patient to their service for continued care.  Dispo: Admit  Labs reviewed by myself and considered in medical decision making.  Imaging reviewed by myself and considered in medical decision making. Imaging final read interpreted by radiology.  1. Dysphagia, unspecified type      The plan for this patient was discussed with Dr. Wallace Cullens, who voiced agreement and who oversaw evaluation and treatment of this patient.     Fayrene Helper, MD 06/04/23 0125    Edwin Dada P, DO 06/14/23 1334

## 2023-06-03 NOTE — ED Notes (Signed)
ED TO INPATIENT HANDOFF REPORT  ED Nurse Name and Phone #: Minerva Areola [phone]  S Name/Age/Gender Randy Merritt 82 y.o. male Room/Bed: 038C/038C  Code Status   Code Status: Full Code  Home/SNF/Other Home Patient oriented to: self, place, time, and situation Is this baseline? Yes   Triage Complete: Triage complete  Chief Complaint Dysphagia [R13.10]  Triage Note Pt endorses dysphagia, seen for same 7/9 hx of goiter; endorses sob and a "funny feeling" in chest when standing and is bringing up mucus, denies cough; denies cardiac hx, denies pain; no unilateral weakness; pt alert, oriented, speaking full sentences without issue   Allergies Allergies  Allergen Reactions   Alprazolam Other (See Comments)   Cyclobenzaprine Other (See Comments)   Prednisone Palpitations and Other (See Comments)    Level of Care/Admitting Diagnosis ED Disposition     ED Disposition  Admit   Condition  --   Comment  Hospital Area: [facility] [[id number]]  Level of Care: Med-Surg [16]  May place patient in observation at [facility] or [facility] if equivalent level of care is available:: No  Covid Evaluation: Asymptomatic - no recent exposure (last 10 days) testing not required  Diagnosis: Dysphagia [191478]  Admitting Physician: Hillary Bow [[id number]]  Attending Physician: Hillary Bow [[id number]]          B Medical/Surgery History Past Medical History:  Diagnosis Date   Acid reflux    Anxiety    Arthritis    Depression    " a little"   Goiter    High cholesterol    History of blood transfusion    Perforated bowel (HCC)    Seasonal allergies    Past Surgical History:  Procedure Laterality Date   ANTERIOR CERVICAL DECOMP/DISCECTOMY FUSION N/A 04/08/2013   Procedure: ANTERIOR CERVICAL DECOMPRESSION/DISCECTOMY FUSION 1 LEVEL;  Surgeon: Karn Cassis, MD;  Location: [facility];  Service: Neurosurgery;  Laterality: N/A;  Cervical five-six Anterior cervical  decompression/diskectomy fusion   APPENDECTOMY N/A 10/06/2019   Procedure: APPENDECTOMY;  Surgeon: Berna Bue, MD;  Location: [facility] OR;  Service: General;  Laterality: N/A;   BIOPSY  01/30/2019   Procedure: BIOPSY;  Surgeon: Jeani Hawking, MD;  Location: [facility];  Service: Endoscopy;;   CERVICAL DISCECTOMY     x2   CHOLECYSTECTOMY     COLON RESECTION SIGMOID N/A 10/06/2019   Procedure: COLON RESECTION SIGMOID;  Surgeon: Berna Bue, MD;  Location: [facility] OR;  Service: General;  Laterality: N/A;   ESOPHAGEAL DILATION  01/30/2019   Procedure: ESOPHAGEAL DILATION;  Surgeon: Jeani Hawking, MD;  Location: [facility] ENDOSCOPY;  Service: Endoscopy;;  [place]   ESOPHAGOGASTRODUODENOSCOPY (EGD) WITH PROPOFOL N/A 01/30/2019   Procedure: ESOPHAGOGASTRODUODENOSCOPY (EGD) WITH PROPOFOL;  Surgeon: Jeani Hawking, MD;  Location: [facility];  Service: Endoscopy;  Laterality: N/A;   gallstone removal     LAPAROTOMY N/A 10/06/2019   Procedure: EXPLORATORY LAPAROTOMY;  Surgeon: Berna Bue, MD;  Location: [facility] OR;  Service: General;  Laterality: N/A;     A IV Location/Drains/Wounds Patient Lines/Drains/Airways Status     Active Line/Drains/Airways     Name Placement date Placement time Site Days   Peripheral IV 06/03/23 20 G Left Antecubital 06/03/23  1439  Antecubital  less than 1            Intake/Output Last 24 hours No intake or output data in the 24 hours ending 06/03/23 2210  Labs/Imaging Results for orders placed or performed during the hospital  encounter of 06/03/23 (from the past 48 hour(s))  Basic metabolic panel     Status: Abnormal   Collection Time: 06/03/23 11:39 AM  Result Value Ref Range   Sodium 143 135 - 145 mmol/L   Potassium 4.3 3.5 - 5.1 mmol/L   Chloride 97 (L) 98 - 111 mmol/L   CO2 28 22 - 32 mmol/L   Glucose, Bld 100 (H) 70 - 99 mg/dL    Comment: Glucose reference range applies only to samples taken after fasting for at least 8 hours.   BUN 11 8 - 23 mg/dL    Creatinine, Ser 6.04 (H) 0.61 - 1.24 mg/dL   Calcium 54.0 8.9 - 98.1 mg/dL   GFR, Estimated 55 (L) >60 mL/min    Comment: (NOTE) Calculated using the CKD-EPI Creatinine Equation (2021)    Anion gap 18 (H) 5 - 15    Comment: Performed at [facility], [address], [place], [place] [address]  CBC     Status: Abnormal   Collection Time: 06/03/23 11:39 AM  Result Value Ref Range   WBC 2.3 (L) 4.0 - 10.5 K/uL   RBC 4.10 (L) 4.22 - 5.81 MIL/uL   Hemoglobin 12.7 (L) 13.0 - 17.0 g/dL   HCT 82.9 (L) 56.2 - 13.0 %   MCV 94.9 80.0 - 100.0 fL   MCH 31.0 26.0 - 34.0 pg   MCHC 32.6 30.0 - 36.0 g/dL   RDW 86.5 78.4 - 69.6 %   Platelets 152 150 - 400 K/uL   nRBC 0.0 0.0 - 0.2 %    Comment: Performed at [facility], [address] [place], [place] [address]  Troponin I (High Sensitivity)     Status: None   Collection Time: 06/03/23 11:39 AM  Result Value Ref Range   Troponin I (High Sensitivity) 13 <18 ng/L    Comment: (NOTE) Elevated high sensitivity troponin I (hsTnI) values and significant  changes across serial measurements may suggest ACS but many other  chronic and acute conditions are known to elevate hsTnI results.  Refer to the "Links" section for chest pain algorithms and additional  guidance. Performed at [facility], [address], [place], [place] [address]   Troponin I (High Sensitivity)     Status: None   Collection Time: 06/03/23  2:03 PM  Result Value Ref Range   Troponin I (High Sensitivity) 12 <18 ng/L    Comment: (NOTE) Elevated high sensitivity troponin I (hsTnI) values and significant  changes across serial measurements may suggest ACS but many other  chronic and acute conditions are known to elevate hsTnI results.  Refer to the "Links" section for chest pain algorithms and additional  guidance. Performed at [facility], [address], [place], [place] [address]    CT Soft Tissue Neck W Contrast  Result Date: 06/03/2023 CLINICAL  DATA:  Thyroid nodule. EXAM: CT NECK WITH CONTRAST TECHNIQUE: Multidetector CT imaging of the neck was performed using the standard protocol following the bolus administration of intravenous contrast. RADIATION DOSE REDUCTION: This exam was performed according to the departmental dose-optimization program which includes automated exposure control, adjustment of the mA and/or kV according to patient size and/or use of iterative reconstruction technique. CONTRAST:  75mL OMNIPAQUE IOHEXOL 350 MG/ML SOLN COMPARISON:  CT neck 04/30/2023, thyroid ultrasound 10/17/2022 FINDINGS: Pharynx and larynx: The nasal cavity and nasopharynx are unremarkable. The oral cavity and oropharynx are unremarkable. The parapharyngeal spaces are clear. The hypopharynx and larynx  are unremarkable. The vocal folds are normal in appearance. The epiglottis is normal. There is no retropharyngeal collection. The airway is patent. Salivary glands: The parotid and submandibular glands are unremarkable. Thyroid: The large right thyroid nodule measuring up 2 4.7 cm AP x 4.6 cm TV x 6.5 cm cc and left thyroid nodule measuring 2.8 cm x 1.6 cm x 3.9 cm are not significantly changed, with foci of calcification on the right. There is unchanged mass effect with leftward deviation and mild narrowing of the airway. These nodules has been previously evaluated by ultrasound and biopsy. Lymph nodes: There is no pathologic lymphadenopathy in the neck. Vascular: Unremarkable. Limited intracranial: Unremarkable. Visualized orbits: Unremarkable. Mastoids and visualized paranasal sinuses: Clear. Skeleton: Postsurgical changes reflecting C5-C6 ACDF are again noted, without evidence of complication. There is also fusion across the C3-C4 disc space. Bulky anterior osteophytes throughout the cervical spine are unchanged. There is no new acute osseous abnormality or suspicious osseous lesion. Bulky bilateral stylohyoid ligament calcification is unchanged. Upper chest: The  imaged lung apices are clear. Other: None. IMPRESSION: 1. Unchanged large right and smaller left thyroid nodules with regional mass effect resulting in mild leftward displacement and narrowing of the infraglottic airway. These nodules has been previously evaluated by ultrasound and biopsy. 2. No new or acute finding in the neck. Electronically Signed   By: Lesia Hausen M.D.   On: 06/03/2023 15:41   DG Chest 2 View  Result Date: 06/03/2023 CLINICAL DATA:  Shortness of breath EXAM: CHEST - 2 VIEW COMPARISON:  X-ray 05/21/2023 and older FINDINGS: Hyperinflation. No consolidation, pneumothorax or effusion. Normal cardiopericardial silhouette without edema. Surgical clips in the right upper quadrant. Fixation hardware along the lower cervical spine. There is some narrowing of the trachea with shift from right-to-left at the thoracic inlet. Etiology is uncertain. Please correlate with prior neck CT scan of 04/30/2023 consistent with an enlarged thyroid gland. IMPRESSION: Hyperinflation.  No acute cardiopulmonary disease. Electronically Signed   By: Karen Kays M.D.   On: 06/03/2023 13:05    Pending Labs Unresulted Labs (From admission, onward)     Start     Ordered   06/04/23 0500  CBC  Tomorrow morning,   R        06/03/23 2158   06/04/23 0500  Basic metabolic panel  Tomorrow morning,   R        06/03/23 2158            Vitals/Pain Today's Vitals   06/03/23 1948 06/03/23 2015 06/03/23 2034 06/03/23 2140  BP:  (!) 155/86 (!) 155/86   Pulse:  66 66   Resp: (!) 21  19   Temp:   (!) 97.4 F (36.3 C)   TempSrc:   Oral   SpO2:  100% 100%   Weight:      Height:      PainSc:    6     Isolation Precautions No active isolations  Medications Medications  lactated ringers infusion ( Intravenous New Bag/Given 06/03/23 2201)  enoxaparin (LOVENOX) injection 40 mg (40 mg Subcutaneous Given 06/03/23 2201)  ondansetron (ZOFRAN) tablet 4 mg (has no administration in time range)    Or  ondansetron  (ZOFRAN) injection 4 mg (has no administration in time range)  iohexol (OMNIPAQUE) 350 MG/ML injection 75 mL (75 mLs Intravenous Contrast Given 06/03/23 1500)    Mobility walks     Focused Assessments Cardiac Assessment Handoff:  Cardiac Rhythm: Normal sinus rhythm Lab Results  Component Value  Date   TROPONINI <0.30 02/10/2014   Lab Results  Component Value Date   DDIMER 0.57 (H) 07/01/2021   Does the Patient currently have chest pain? No    R Recommendations: See Admitting Provider Note  Report given to:   Additional Notes: failed swallow screen, cooperative, breathing good

## 2023-06-03 NOTE — H&P (Signed)
History and Physical    Patient: Randy Merritt ZOX:[id number] DOB: July 17, 1941 DOA: 06/03/2023 DOS: the patient was seen and examined on 06/03/2023 PCP: Lorenda Ishihara, MD  Patient coming from: Home  Chief Complaint:  Chief Complaint  Patient presents with   Dysphagia   Chest Pain   HPI: Randy Merritt is a 82 y.o. male with medical history significant of non-toxic goiter, progressively worsening dysphagia.  Pt in to ED with progressively worsening dysphagia, now having to spit up his secretions.  He notes that symptoms have worsened over the past two weeks and this morning was unable to swallow anything PO.  15lb wt loss due to decreased PO intake with that.  No cough, no fever.  On review: looks like he most recently saw [facility] with EGD performed in June, ? Dilation done during that EGD.  Can't see the report, just the billing info.  Note from general surgery (Dr. Gerrit Friends) on 04/01/2023 has had enlarging nontoxic goiter.  Was sent for CT to see if it is causing extrinsic compression on either airway or esophagus (CT then and again today in ED confirms that it is causing compression / mass effect of airway).  Looks like he's also seen SLP for dysphagia.  Previous esophagram in Jan this year.  Referred to ENT for possible botox injections.  Review of Systems: As mentioned in the history of present illness. All other systems reviewed and are negative. Past Medical History:  Diagnosis Date   Acid reflux    Anxiety    Arthritis    Depression    " a little"   Goiter    High cholesterol    History of blood transfusion    Perforated bowel (HCC)    Seasonal allergies    Past Surgical History:  Procedure Laterality Date   ANTERIOR CERVICAL DECOMP/DISCECTOMY FUSION N/A 04/08/2013   Procedure: ANTERIOR CERVICAL DECOMPRESSION/DISCECTOMY FUSION 1 LEVEL;  Surgeon: Karn Cassis, MD;  Location: [facility];  Service: Neurosurgery;  Laterality: N/A;  Cervical five-six  Anterior cervical decompression/diskectomy fusion   APPENDECTOMY N/A 10/06/2019   Procedure: APPENDECTOMY;  Surgeon: Berna Bue, MD;  Location: [facility] OR;  Service: General;  Laterality: N/A;   BIOPSY  01/30/2019   Procedure: BIOPSY;  Surgeon: Jeani Hawking, MD;  Location: [facility];  Service: Endoscopy;;   CERVICAL DISCECTOMY     x2   CHOLECYSTECTOMY     COLON RESECTION SIGMOID N/A 10/06/2019   Procedure: COLON RESECTION SIGMOID;  Surgeon: Berna Bue, MD;  Location: [facility] OR;  Service: General;  Laterality: N/A;   ESOPHAGEAL DILATION  01/30/2019   Procedure: ESOPHAGEAL DILATION;  Surgeon: Jeani Hawking, MD;  Location: [facility];  Service: Endoscopy;;  [place]   ESOPHAGOGASTRODUODENOSCOPY (EGD) WITH PROPOFOL N/A 01/30/2019   Procedure: ESOPHAGOGASTRODUODENOSCOPY (EGD) WITH PROPOFOL;  Surgeon: Jeani Hawking, MD;  Location: [facility];  Service: Endoscopy;  Laterality: N/A;   gallstone removal     LAPAROTOMY N/A 10/06/2019   Procedure: EXPLORATORY LAPAROTOMY;  Surgeon: Berna Bue, MD;  Location: [facility] OR;  Service: General;  Laterality: N/A;   Social History:  reports that he quit smoking about 64 years ago. His smoking use included cigarettes. He started smoking about 65 years ago. He has a 0.3 pack-year smoking history. He has never used smokeless tobacco. He reports that he does not drink alcohol and does not use drugs.  Allergies  Allergen Reactions   Alprazolam Other (See Comments)   Cyclobenzaprine Other (See Comments)   Prednisone  Palpitations and Other (See Comments)    Family History  Problem Relation Age of Onset   Healthy Mother    Healthy Father    Hypertension Sister    Hypertension Sister    Diabetes Sister    Hypertension Brother    Thyroid disease Neg Hx     Prior to Admission medications   Medication Sig Start Date End Date Taking? Authorizing Provider  acetaminophen (TYLENOL) 500 MG tablet Take 2 tablets (1,000 mg total) by mouth every 8 (eight)  hours. Patient taking differently: Take 1,000 mg by mouth every 8 (eight) hours as needed. pain 10/15/19  Yes Sherrie George, PA-C  amoxicillin (AMOXIL) 875 MG tablet Take 875 mg by mouth 2 (two) times daily. 05/28/23  Yes [provider]  aspirin 81 MG chewable tablet Chew 81 mg by mouth daily.   Yes [provider]  buPROPion (WELLBUTRIN XL) 150 MG 24 hr tablet Take 150 mg by mouth every morning. 03/18/23  Yes [provider]  cetirizine (ZYRTEC) 10 MG tablet Take 10 mg by mouth daily. 12/17/22  Yes [provider]  esomeprazole (NEXIUM) 40 MG capsule Take 1 capsule (40 mg total) by mouth daily. 05/07/23  Yes Domenick Gong, MD  famotidine (PEPCID) 20 MG tablet Take 1 tablet (20 mg total) by mouth 2 (two) times daily. 03/30/22  Yes Kommor, [place], MD  finasteride (PROSCAR) 5 MG tablet Take 5 mg by mouth daily.   Yes [provider]  fluticasone (FLONASE) 50 MCG/ACT nasal spray Place 2 sprays into both nostrils daily.   Yes [provider]  hydrOXYzine (ATARAX) 50 MG tablet Take 25-50 mg by mouth 2 (two) times daily as needed. 05/30/23  Yes [provider]  metoprolol succinate (TOPROL-XL) 25 MG 24 hr tablet Take 25 mg by mouth daily. 07/20/21  Yes [provider]  QUEtiapine (SEROQUEL) 25 MG tablet Take 25 mg by mouth at bedtime. 05/30/23  Yes [provider]  Rosuvastatin Calcium 10 MG CPSP Take 10 mg by mouth daily. 05/26/21  Yes [provider]  tiZANidine (ZANAFLEX) 2 MG tablet Take 2 mg by mouth at bedtime as needed. 05/24/23  Yes [provider]  traZODone (DESYREL) 100 MG tablet Take 100 mg by mouth at bedtime as needed. 04/25/23  Yes [provider]  triamcinolone (KENALOG) 0.025 % ointment Apply 1 Application topically 2 (two) times daily.   Yes [provider]    Physical Exam: Vitals:   06/03/23 1948 06/03/23 2015 06/03/23 2034 06/03/23 2309  BP:  (!) 155/86 (!) 155/86 (!)  159/95  Pulse:  66 66 79  Resp: (!) 21  19 18   Temp:   (!) 97.4 F (36.3 C) 97.8 F (36.6 C)  TempSrc:   Oral Oral  SpO2:  100% 100% 100%  Weight:      Height:       Constitutional: NAD, calm, comfortable, Spitting saliva into emesis bag every so often, but no distress, respiratory difficulty, etc. Neck: goiter present Respiratory: clear to auscultation bilaterally, no wheezing, no crackles. Normal respiratory effort. No accessory muscle use.  Cardiovascular: Regular rate and rhythm, no murmurs / rubs / gallops. No extremity edema. 2+ pedal pulses. No carotid bruits.  Abdomen: no tenderness, no masses palpated. No hepatosplenomegaly. Bowel sounds positive.  Neurologic: CN 2-12 grossly intact. Sensation intact, DTR normal. Strength 5/5 in all 4.  Psychiatric: Normal judgment and insight. Alert and oriented x 3. Normal mood.   Data Reviewed:    Labs  on Admission: I have personally reviewed following labs and imaging studies  CBC: Recent Labs  Lab 06/03/23 1139  WBC 2.3*  HGB 12.7*  HCT 38.9*  MCV 94.9  PLT 152   Basic Metabolic Panel: Recent Labs  Lab 06/03/23 1139  NA 143  K 4.3  CL 97*  CO2 28  GLUCOSE 100*  BUN 11  CREATININE 1.30*  CALCIUM 10.0   GFR: Estimated Creatinine Clearance: 40.8 mL/min (A) (by C-G formula based on SCr of 1.3 mg/dL (H)). Liver Function Tests: No results for input(s): "AST", "ALT", "ALKPHOS", "BILITOT", "PROT", "ALBUMIN" in the last 168 hours. No results for input(s): "LIPASE", "AMYLASE" in the last 168 hours. No results for input(s): "AMMONIA" in the last 168 hours. Coagulation Profile: No results for input(s): "INR", "PROTIME" in the last 168 hours. Cardiac Enzymes: No results for input(s): "CKTOTAL", "CKMB", "CKMBINDEX", "TROPONINI" in the last 168 hours. BNP (last 3 results) No results for input(s): "PROBNP" in the last 8760 hours. HbA1C: No results for input(s): "HGBA1C" in the last 72 hours. CBG: No results for input(s):  "GLUCAP" in the last 168 hours. Lipid Profile: No results for input(s): "CHOL", "HDL", "LDLCALC", "TRIG", "CHOLHDL", "LDLDIRECT" in the last 72 hours. Thyroid Function Tests: No results for input(s): "TSH", "T4TOTAL", "FREET4", "T3FREE", "THYROIDAB" in the last 72 hours. Anemia Panel: No results for input(s): "VITAMINB12", "FOLATE", "FERRITIN", "TIBC", "IRON", "RETICCTPCT" in the last 72 hours. Urine analysis:    Component Value Date/Time   COLORURINE YELLOW 05/21/2023 1007   APPEARANCEUR CLEAR 05/21/2023 1007   LABSPEC 1.008 05/21/2023 1007   PHURINE 7.0 05/21/2023 1007   GLUCOSEU NEGATIVE 05/21/2023 1007   HGBUR NEGATIVE 05/21/2023 1007   BILIRUBINUR NEGATIVE 05/21/2023 1007   KETONESUR NEGATIVE 05/21/2023 1007   PROTEINUR NEGATIVE 05/21/2023 1007   UROBILINOGEN 0.2 05/17/2021 0852   NITRITE NEGATIVE 05/21/2023 1007   LEUKOCYTESUR NEGATIVE 05/21/2023 1007    Radiological Exams on Admission: CT Soft Tissue Neck W Contrast  Result Date: 06/03/2023 CLINICAL DATA:  Thyroid nodule. EXAM: CT NECK WITH CONTRAST TECHNIQUE: Multidetector CT imaging of the neck was performed using the standard protocol following the bolus administration of intravenous contrast. RADIATION DOSE REDUCTION: This exam was performed according to the departmental dose-optimization program which includes automated exposure control, adjustment of the mA and/or kV according to patient size and/or use of iterative reconstruction technique. CONTRAST:  75mL OMNIPAQUE IOHEXOL 350 MG/ML SOLN COMPARISON:  CT neck 04/30/2023, thyroid ultrasound 10/17/2022 FINDINGS: Pharynx and larynx: The nasal cavity and nasopharynx are unremarkable. The oral cavity and oropharynx are unremarkable. The parapharyngeal spaces are clear. The hypopharynx and larynx are unremarkable. The vocal folds are normal in appearance. The epiglottis is normal. There is no retropharyngeal collection. The airway is patent. Salivary glands: The parotid and  submandibular glands are unremarkable. Thyroid: The large right thyroid nodule measuring up 2 4.7 cm AP x 4.6 cm TV x 6.5 cm cc and left thyroid nodule measuring 2.8 cm x 1.6 cm x 3.9 cm are not significantly changed, with foci of calcification on the right. There is unchanged mass effect with leftward deviation and mild narrowing of the airway. These nodules has been previously evaluated by ultrasound and biopsy. Lymph nodes: There is no pathologic lymphadenopathy in the neck. Vascular: Unremarkable. Limited intracranial: Unremarkable. Visualized orbits: Unremarkable. Mastoids and visualized paranasal sinuses: Clear. Skeleton: Postsurgical changes reflecting C5-C6 ACDF are again noted, without evidence of complication. There is also fusion across the C3-C4 disc space. Bulky anterior osteophytes throughout the cervical spine  are unchanged. There is no new acute osseous abnormality or suspicious osseous lesion. Bulky bilateral stylohyoid ligament calcification is unchanged. Upper chest: The imaged lung apices are clear. Other: None. IMPRESSION: 1. Unchanged large right and smaller left thyroid nodules with regional mass effect resulting in mild leftward displacement and narrowing of the infraglottic airway. These nodules has been previously evaluated by ultrasound and biopsy. 2. No new or acute finding in the neck. Electronically Signed   By: Lesia Hausen M.D.   On: 06/03/2023 15:41   DG Chest 2 View  Result Date: 06/03/2023 CLINICAL DATA:  Shortness of breath EXAM: CHEST - 2 VIEW COMPARISON:  X-ray 05/21/2023 and older FINDINGS: Hyperinflation. No consolidation, pneumothorax or effusion. Normal cardiopericardial silhouette without edema. Surgical clips in the right upper quadrant. Fixation hardware along the lower cervical spine. There is some narrowing of the trachea with shift from right-to-left at the thoracic inlet. Etiology is uncertain. Please correlate with prior neck CT scan of 04/30/2023 consistent with  an enlarged thyroid gland. IMPRESSION: Hyperinflation.  No acute cardiopulmonary disease. Electronically Signed   By: Karen Kays M.D.   On: 06/03/2023 13:05    EKG: Independently reviewed.   Assessment and Plan: * Dysphagia Unclear if related to goiter or other cause. Looks like he has seen SLP, [facility] with scope just last month (can't see results though), and sounds like he has referral to ENT. NPO IVF EDP consulted ENT who will see here in hospital I sent secure chat to Dr. Levora Angel (conveniently the same gastroenterologist who appears to have seen in office / done EGD last month is on call): he states GI will see in consult in AM Gen surg to see for goiter (See below) SLP eval and treat  Non-toxic multinodular goiter Goiter is confirmed to be causing extrinsic compression of airway on multiple CT scans now. Not clear if its causing his dysphagia, contributing to his dysphagia but not complete cause, or completely unrelated to his dysphagia. Regardless: as I discussed with patient, I think that compression of airway is probably reason enough for gen surg to take a look at possibly doing thyroidectomy (wether or not this does anything beneficial for his dysphagia or not) EDP consulted gen surg, they will see during hospital stay.  Essential (primary) hypertension Holding all PO meds due to dysphagia.  CKD (chronic kidney disease) stage 3, GFR 30-59 ml/min (HCC) Creat 1.3 today, presumably his baseline.      Advance Care Planning:   Code Status: Full Code  Consults: GI, gen surg, ENT  Family Communication: No family in room  Severity of Illness: The appropriate patient status for this patient is OBSERVATION. Observation status is judged to be reasonable and necessary in order to provide the required intensity of service to ensure the patient's safety. The patient's presenting symptoms, physical exam findings, and initial radiographic and laboratory data in the context of  their medical condition is felt to place them at decreased risk for further clinical deterioration. Furthermore, it is anticipated that the patient will be medically stable for discharge from the hospital within 2 midnights of admission.   Author: Hillary Bow., DO 06/03/2023 11:15 PM  For on call review [email or web address].

## 2023-06-03 NOTE — ED Notes (Signed)
Patient transported to X-ray 

## 2023-06-03 NOTE — Assessment & Plan Note (Addendum)
Holding all PO meds due to dysphagia.

## 2023-06-03 NOTE — ED Triage Notes (Signed)
Pt endorses dysphagia, seen for same 7/9 hx of goiter; endorses sob and a "funny feeling" in chest when standing and is bringing up mucus, denies cough; denies cardiac hx, denies pain; no unilateral weakness; pt alert, oriented, speaking full sentences without issue

## 2023-06-03 NOTE — Assessment & Plan Note (Signed)
Goiter is confirmed to be causing extrinsic compression of airway on multiple CT scans now. Not clear if its causing his dysphagia, contributing to his dysphagia but not complete cause, or completely unrelated to his dysphagia. Regardless: as I discussed with patient, I think that compression of airway is probably reason enough for gen surg to take a look at possibly doing thyroidectomy (wether or not this does anything beneficial for his dysphagia or not) EDP consulted gen surg, they will see during hospital stay.

## 2023-06-03 NOTE — Progress Notes (Unsigned)
  Subjective: 82 y.o. returns the office today for painful, elongated, thickened toenails which he  cannot trim himself.  States the right toenail with ingrown nail removed feels funny.  No change in posterior distal subcu Epsom salts.  No cellulitis.  PCP: Johny Blamer, MD  Objective: AAO 3, NAD DP/PT pulses palpable, CRT less than 3 seconds Nails hypertrophic, dystrophic, elongated, brittle, discolored 10. There is tenderness overlying the nails 1-5 bilaterally. There is no surrounding erythema or drainage along the nail sites. Status post partial nail avulsion right hallux.  Some dried callus on the periphery of the toenail and minimal scab present.  Upon debridement there is no ulceration.  No drainage or pus.  No edema, erythema.  It seems to reduce the sensation changes is more the second toe is rubbing the big toe and he has callus to this area. No pain with calf compression, swelling, warmth, erythema.  Assessment: Patient presents with symptomatic onychomycosis  Plan: -Treatment options including alternatives, risks, complications were discussed -Nails sharply debrided 10 without complication/bleeding. -Debrided the callus, scab on left hallux nail with any complications of bleeding.  Since the toe.  Will continue with Epsom salt soaks daily.  Monitor for signs or symptoms of infection or recurrence. -Discussed daily foot inspection. If there are any changes, to call the office immediately.  -Follow-up in 3 months or sooner if any problems are to arise. In the meantime, encouraged to call the office with any questions, concerns, changes symptoms.  Ovid Curd, DPM

## 2023-06-03 NOTE — ED Provider Notes (Signed)
[place] EMERGENCY DEPARTMENT AT [facility] Provider Note   CSN: [id number] Arrival date & time: 06/03/23  1050     History  Chief Complaint  Patient presents with   Dysphagia   Chest Pain    Randy Merritt is a 82 y.o. male. 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   States that he is having dysphagia and decreased PO intake. He notes that symptoms have worsened over the past two weeks and this morning was unable to swallow anything PO. Notes he tried to make two soft eggs but when he couldn't swallow them felt he should be evaluated. Notes he has also lost about 15lbs over the past week due to his dysphagia. He is having pain in his lower neck when moving to the left. He states he is also spitting up mucous, which is not new. Denies cough or fever. Additionally, he states he has intermittently been having chest pains. It is central, and describes it as "like a wave coming over me all of a sudden." Denies palpitations or shortness of breath. No nausea or diaphoresis.   On chart review, appears that he sees [facility] so cannot see full chart. Note from general surgery on 04/01/2023 has had enlarging nontoxic goiter. He has been evaluated by SLP for dysphagia before. Previous esophagram without extrinsic compression. He has also been referred to ENT for possible botox injections.    HPI     Home Medications Prior to Admission medications   Medication Sig Start Date End Date Taking? Authorizing Provider  acetaminophen (TYLENOL) 500 MG tablet Take 2 tablets (1,000 mg total) by mouth every 8 (eight) hours. Patient taking differently: Take 1,000 mg by mouth every 8 (eight) hours as needed. pain 10/15/19   Sherrie George, PA-C  aspirin 81 MG chewable tablet Chew 81 mg by mouth daily.    [provider]  cephALEXin (KEFLEX) 500 MG capsule Take 1 capsule  (500 mg total) by mouth 3 (three) times daily. Patient not taking: Reported on 06/03/2023 04/29/23   Vivi Barrack, DPM  clonazePAM (KLONOPIN) 0.5 MG tablet Take 0.5 mg by mouth. prn    [provider]  esomeprazole (NEXIUM) 40 MG capsule Take 1 capsule (40 mg total) by mouth daily. 05/07/23   Domenick Gong, MD  famotidine (PEPCID) 20 MG tablet Take 1 tablet (20 mg total) by mouth 2 (two) times daily. 03/30/22   Kommor, Madison, MD  finasteride (PROSCAR) 5 MG tablet Take 5 mg by mouth daily.    [provider]  metoprolol succinate (TOPROL-XL) 25 MG 24 hr tablet Take 25 mg by mouth daily. 07/20/21   [provider]  Multiple Vitamins-Iron (MULTIVITAMINS WITH IRON) TABS tablet You should get a Women's One a day vitamin with iron and take daily.  This will help with your low hemoglobin.  You can buy this at any drug store, and choose whatever brand you like. Patient taking differently: Take 1 tablet by mouth daily. 10/15/19   Sherrie George, PA-C  Rosuvastatin Calcium 10 MG CPSP Take 10 mg by mouth daily. 05/26/21   [provider]  timolol (TIMOPTIC) 0.5 % ophthalmic solution Place 1 drop into both eyes 2 (two) times daily. 11/23/20   [provider]      Allergies    Alprazolam, Cyclobenzaprine, and Prednisone    Review of Systems   Review of Systems  HENT:  Positive for trouble swallowing.   Respiratory:  Positive for choking.   All other systems reviewed and are negative.   Physical Exam Updated Vital Signs BP (!) 157/81   Pulse (!) 58   Temp 97.6 F (36.4 C) (Oral)   Resp (!) 24   Ht 5\' 8"  (1.727 m)   Wt 65.8 kg   SpO2 100%   BMI 22.05 kg/m  Physical Exam Vitals and nursing note reviewed.  Constitutional:      General: He is not in acute distress.    Appearance: Normal appearance. He is well-developed.     Comments: Chronically ill appearing   HENT:     Head: Normocephalic.  Eyes:     General: No scleral icterus. Neck:      Thyroid: Thyromegaly present.     Trachea: No tracheal deviation.  Cardiovascular:     Rate and Rhythm: Normal rate and regular rhythm.     Pulses:          Radial pulses are 2+ on the right side and 2+ on the left side.     Heart sounds: Normal heart sounds.  Pulmonary:     Effort: Pulmonary effort is normal. No respiratory distress.     Breath sounds: Normal breath sounds. No stridor.  Abdominal:     General: Bowel sounds are normal.     Palpations: Abdomen is soft.  Musculoskeletal:     Right lower leg: No edema.     Left lower leg: No edema.  Skin:    General: Skin is warm and dry.     Capillary Refill: Capillary refill takes less than 2 seconds.  Neurological:     General: No focal deficit present.     Mental Status: He is alert.  Psychiatric:        Mood and Affect: Mood normal.        Behavior: Behavior normal.     ED Results / Procedures / Treatments   Labs (all labs ordered are listed, but only abnormal results are displayed) Labs Reviewed  BASIC METABOLIC PANEL - Abnormal; Notable for the following components:      Result Value   Chloride 97 (*)    Glucose, Bld 100 (*)    Creatinine, Ser 1.30 (*)    GFR, Estimated 55 (*)    Anion gap 18 (*)    All other components within normal limits  CBC - Abnormal; Notable for the following components:   WBC 2.3 (*)    RBC 4.10 (*)    Hemoglobin 12.7 (*)    HCT 38.9 (*)    All other components within normal limits  TROPONIN I (HIGH SENSITIVITY)  TROPONIN I (HIGH SENSITIVITY)    EKG None  Radiology CT Soft Tissue Neck W Contrast  Result Date: 06/03/2023 CLINICAL DATA:  Thyroid nodule. EXAM: CT NECK WITH CONTRAST TECHNIQUE: Multidetector CT imaging of the neck was performed using the standard protocol following the bolus administration of intravenous contrast. RADIATION DOSE REDUCTION: This exam was performed according to the departmental dose-optimization program which includes automated exposure control,  adjustment of the mA and/or kV according to patient size and/or use of iterative reconstruction technique. CONTRAST:  75mL OMNIPAQUE IOHEXOL 350 MG/ML SOLN COMPARISON:  CT neck 04/30/2023, thyroid ultrasound 10/17/2022 FINDINGS: Pharynx and larynx: The nasal cavity and nasopharynx are unremarkable. The oral cavity and oropharynx are unremarkable. The parapharyngeal spaces are clear. The hypopharynx and larynx are unremarkable. The vocal folds are normal in appearance. The epiglottis  is normal. There is no retropharyngeal collection. The airway is patent. Salivary glands: The parotid and submandibular glands are unremarkable. Thyroid: The large right thyroid nodule measuring up 2 4.7 cm AP x 4.6 cm TV x 6.5 cm cc and left thyroid nodule measuring 2.8 cm x 1.6 cm x 3.9 cm are not significantly changed, with foci of calcification on the right. There is unchanged mass effect with leftward deviation and mild narrowing of the airway. These nodules has been previously evaluated by ultrasound and biopsy. Lymph nodes: There is no pathologic lymphadenopathy in the neck. Vascular: Unremarkable. Limited intracranial: Unremarkable. Visualized orbits: Unremarkable. Mastoids and visualized paranasal sinuses: Clear. Skeleton: Postsurgical changes reflecting C5-C6 ACDF are again noted, without evidence of complication. There is also fusion across the C3-C4 disc space. Bulky anterior osteophytes throughout the cervical spine are unchanged. There is no new acute osseous abnormality or suspicious osseous lesion. Bulky bilateral stylohyoid ligament calcification is unchanged. Upper chest: The imaged lung apices are clear. Other: None. IMPRESSION: 1. Unchanged large right and smaller left thyroid nodules with regional mass effect resulting in mild leftward displacement and narrowing of the infraglottic airway. These nodules has been previously evaluated by ultrasound and biopsy. 2. No new or acute finding in the neck. Electronically  Signed   By: Lesia Hausen M.D.   On: 06/03/2023 15:41   DG Chest 2 View  Result Date: 06/03/2023 CLINICAL DATA:  Shortness of breath EXAM: CHEST - 2 VIEW COMPARISON:  X-ray 05/21/2023 and older FINDINGS: Hyperinflation. No consolidation, pneumothorax or effusion. Normal cardiopericardial silhouette without edema. Surgical clips in the right upper quadrant. Fixation hardware along the lower cervical spine. There is some narrowing of the trachea with shift from right-to-left at the thoracic inlet. Etiology is uncertain. Please correlate with prior neck CT scan of 04/30/2023 consistent with an enlarged thyroid gland. IMPRESSION: Hyperinflation.  No acute cardiopulmonary disease. Electronically Signed   By: Karen Kays M.D.   On: 06/03/2023 13:05    Procedures Procedures   Medications Ordered in ED Medications  iohexol (OMNIPAQUE) 350 MG/ML injection 75 mL (75 mLs Intravenous Contrast Given 06/03/23 1500)    ED Course/ Medical Decision Making/ A&P Clinical Course as of 06/03/23 1546  Mon Jun 03, 2023  1508 CT soft tissue of neck... awaiting read of CT Neck. Reduced PO over last week. Not handling secretions now.  [BB]    Clinical Course User Index [BB] Fayrene Helper, MD   {   Medical Decision Making Amount and/or Complexity of Data Reviewed Labs: ordered. Radiology: ordered.  Risk Prescription drug management.  Care of patient handed off to Dr. Willeen Cass, PGY-2 at change of shift.  82 year old male presenting with dysphagia. Also had complained of intermittent chest pain Here he failed PO trial with liquids.  Regarding CP:  EKG without ischemia or infarction, troponin x2 negative, doubt ACS  Does not appear fluid volume overloaded on exam, no edema on CXR, doubt CHF exacerbation  Considered but doubt PE. PERC 1 due to age, Anner Crete low risk so will defer d-dimer or CTA PE study at this time. CXR without evidence of pneumonia, pleural effusion or pneumothorax. No recent illnesses and  troponin negative so doubt pericarditis or myocarditis  Symptoms inconsistent with aortic dissection   Regarding dysphagia. This is partly a chronic issue. He sees SLP for therapy. Known goiter. Was to have CT imaging per ENT which was completed on 6/18 for globus sensation. That showed chronic mild regional mass effect wihtout significant change.  Today:  IMPRESSION:  1. Unchanged large right and smaller left thyroid nodules with  regional mass effect resulting in mild leftward displacement and  narrowing of the infraglottic airway. These nodules has been  previously evaluated by ultrasound and biopsy.   I have consulted ENT for their opinion given that he is spitting up mucous, was unable to swallow liquids earlier today here. Will likely need further evaluation.  ' Care of patient handed off to Dr. Willeen Cass, PGY-2. Please see his note for completion of care.       Final Clinical Impression(s) / ED Diagnoses Final diagnoses:  Dysphagia, unspecified type    Rx / DC Orders ED Discharge Orders     None         Cristopher Peru, PA-C 06/03/23 1546    Arby Barrette, MD 06/03/23 [phone]

## 2023-06-03 NOTE — Assessment & Plan Note (Signed)
Creat 1.3 today, presumably his baseline.

## 2023-06-03 NOTE — Progress Notes (Signed)
Pt has arrived to unit, from the ER/ED, via stretcher. He is warm, dry, no visible distress.

## 2023-06-04 DIAGNOSIS — D649 Anemia, unspecified: Secondary | ICD-10-CM | POA: Diagnosis present

## 2023-06-04 DIAGNOSIS — G8929 Other chronic pain: Secondary | ICD-10-CM | POA: Diagnosis present

## 2023-06-04 DIAGNOSIS — Z981 Arthrodesis status: Secondary | ICD-10-CM | POA: Diagnosis not present

## 2023-06-04 DIAGNOSIS — F418 Other specified anxiety disorders: Secondary | ICD-10-CM | POA: Diagnosis not present

## 2023-06-04 DIAGNOSIS — E440 Moderate protein-calorie malnutrition: Secondary | ICD-10-CM | POA: Diagnosis present

## 2023-06-04 DIAGNOSIS — R413 Other amnesia: Secondary | ICD-10-CM

## 2023-06-04 DIAGNOSIS — Z87891 Personal history of nicotine dependence: Secondary | ICD-10-CM | POA: Diagnosis not present

## 2023-06-04 DIAGNOSIS — F419 Anxiety disorder, unspecified: Secondary | ICD-10-CM | POA: Diagnosis not present

## 2023-06-04 DIAGNOSIS — F4323 Adjustment disorder with mixed anxiety and depressed mood: Secondary | ICD-10-CM | POA: Diagnosis present

## 2023-06-04 DIAGNOSIS — E042 Nontoxic multinodular goiter: Secondary | ICD-10-CM | POA: Diagnosis present

## 2023-06-04 DIAGNOSIS — Z888 Allergy status to other drugs, medicaments and biological substances status: Secondary | ICD-10-CM | POA: Diagnosis not present

## 2023-06-04 DIAGNOSIS — R1310 Dysphagia, unspecified: Secondary | ICD-10-CM | POA: Diagnosis present

## 2023-06-04 DIAGNOSIS — Z79899 Other long term (current) drug therapy: Secondary | ICD-10-CM | POA: Diagnosis not present

## 2023-06-04 DIAGNOSIS — I129 Hypertensive chronic kidney disease with stage 1 through stage 4 chronic kidney disease, or unspecified chronic kidney disease: Secondary | ICD-10-CM | POA: Diagnosis present

## 2023-06-04 DIAGNOSIS — Z8249 Family history of ischemic heart disease and other diseases of the circulatory system: Secondary | ICD-10-CM | POA: Diagnosis not present

## 2023-06-04 DIAGNOSIS — E7800 Pure hypercholesterolemia, unspecified: Secondary | ICD-10-CM | POA: Diagnosis present

## 2023-06-04 DIAGNOSIS — F32A Depression, unspecified: Secondary | ICD-10-CM | POA: Insufficient documentation

## 2023-06-04 DIAGNOSIS — N1831 Chronic kidney disease, stage 3a: Secondary | ICD-10-CM | POA: Diagnosis present

## 2023-06-04 DIAGNOSIS — M4722 Other spondylosis with radiculopathy, cervical region: Secondary | ICD-10-CM | POA: Diagnosis present

## 2023-06-04 DIAGNOSIS — R627 Adult failure to thrive: Secondary | ICD-10-CM | POA: Diagnosis present

## 2023-06-04 DIAGNOSIS — G4700 Insomnia, unspecified: Secondary | ICD-10-CM | POA: Diagnosis present

## 2023-06-04 DIAGNOSIS — M5412 Radiculopathy, cervical region: Secondary | ICD-10-CM

## 2023-06-04 DIAGNOSIS — Z833 Family history of diabetes mellitus: Secondary | ICD-10-CM | POA: Diagnosis not present

## 2023-06-04 DIAGNOSIS — Z7982 Long term (current) use of aspirin: Secondary | ICD-10-CM | POA: Diagnosis not present

## 2023-06-04 DIAGNOSIS — Z6822 Body mass index (BMI) 22.0-22.9, adult: Secondary | ICD-10-CM | POA: Diagnosis not present

## 2023-06-04 DIAGNOSIS — R1312 Dysphagia, oropharyngeal phase: Secondary | ICD-10-CM | POA: Diagnosis not present

## 2023-06-04 DIAGNOSIS — K219 Gastro-esophageal reflux disease without esophagitis: Secondary | ICD-10-CM | POA: Diagnosis present

## 2023-06-04 DIAGNOSIS — I10 Essential (primary) hypertension: Secondary | ICD-10-CM | POA: Diagnosis not present

## 2023-06-04 LAB — CBC
HCT: 37.1 % — ABNORMAL LOW (ref 39.0–52.0)
Hemoglobin: 12.6 g/dL — ABNORMAL LOW (ref 13.0–17.0)
MCH: 31.2 pg (ref 26.0–34.0)
MCHC: 34 g/dL (ref 30.0–36.0)
MCV: 91.8 fL (ref 80.0–100.0)
Platelets: 133 10*3/uL — ABNORMAL LOW (ref 150–400)
RBC: 4.04 MIL/uL — ABNORMAL LOW (ref 4.22–5.81)
RDW: 12.5 % (ref 11.5–15.5)
WBC: 4 10*3/uL (ref 4.0–10.5)
nRBC: 0 % (ref 0.0–0.2)

## 2023-06-04 LAB — T4, FREE: Free T4: 1.06 ng/dL (ref 0.61–1.12)

## 2023-06-04 LAB — BASIC METABOLIC PANEL
Anion gap: 13 (ref 5–15)
BUN: 16 mg/dL (ref 8–23)
CO2: 21 mmol/L — ABNORMAL LOW (ref 22–32)
Calcium: 9.2 mg/dL (ref 8.9–10.3)
Chloride: 106 mmol/L (ref 98–111)
Creatinine, Ser: 1.27 mg/dL — ABNORMAL HIGH (ref 0.61–1.24)
GFR, Estimated: 56 mL/min — ABNORMAL LOW (ref >60)
Glucose, Bld: 64 mg/dL — ABNORMAL LOW (ref 70–99)
Potassium: 3.7 mmol/L (ref 3.5–5.1)
Sodium: 140 mmol/L (ref 135–145)

## 2023-06-04 LAB — TSH: TSH: 0.053 u[IU]/mL — ABNORMAL LOW (ref 0.350–4.500)

## 2023-06-04 MED ORDER — HYDRALAZINE HCL 20 MG/ML IJ SOLN: 10.0000 mg | INTRAMUSCULAR | Status: DC | PRN

## 2023-06-04 MED ORDER — CLONAZEPAM 0.25 MG PO TBDP
0.5000 mg | ORAL_TABLET | Freq: Once | ORAL | Status: DC
  Filled 2023-06-04: qty 2

## 2023-06-04 MED ORDER — PANTOPRAZOLE SODIUM 40 MG IV SOLR
40.0000 mg | Freq: Two times a day (BID) | INTRAVENOUS | Status: DC
  Administered 2023-06-04 – 2023-06-05 (×4): 40 mg via INTRAVENOUS
  Filled 2023-06-04 (×4): qty 10

## 2023-06-04 MED ORDER — DEXTROSE IN LACTATED RINGERS 5 % IV SOLN: INTRAVENOUS | Status: DC

## 2023-06-04 NOTE — Evaluation (Signed)
Physical Therapy Evaluation Patient Details Name: Delon Revelo MRN: [id number] DOB: 07/26/1941 Today's Date: 06/04/2023  History of Present Illness  82 y.o. male [facility] with progressive dysphagia and weight loss.CT shows goiter with regional mass effect, as well as bulky anterior osteophytes. PMH includes ACDF, HTN, CKDIII, memory loss, chronic pain, anxiety, depression.  Clinical Impression  Pt presents to PT at or near his baseline. Pt is able to ambulate independently for community distances. Pt denies concerns about mobility at this time. PT encourages frequent ambulation during this admission in an effort to maintain strength and function. Pt has no further PT needs at this time. Acute PT signing off.        Assistance Recommended at Discharge None  If plan is discharge home, recommend the following:  Can travel by private vehicle           Equipment Recommendations None recommended by PT  Recommendations for Other Services       Functional Status Assessment Patient has not had a recent decline in their functional status     Precautions / Restrictions Precautions Precautions: None Restrictions Weight Bearing Restrictions: No      Mobility  Bed Mobility Overal bed mobility: Independent                  Transfers Overall transfer level: Independent                      Ambulation/Gait Ambulation/Gait assistance: Independent Gait Distance (Feet): 600 Feet Assistive device: None Gait Pattern/deviations: WFL(Within Functional Limits) Gait velocity: functional Gait velocity interpretation: >2.62 ft/sec, indicative of community ambulatory   General Gait Details: steady step-through gait  Stairs            Wheelchair Mobility     Tilt Bed    Modified Rankin (Stroke Patients Only)       Balance Overall balance assessment: Independent                                            Pertinent Vitals/Pain Pain Assessment Pain Assessment: No/denies pain    Home Living Family/patient expects to be discharged to:: Private residence Living Arrangements: Spouse/significant other Available Help at Discharge: Family;Available 24 hours/day Type of Home: House Home Access: Ramped entrance       Home Layout: One level Home Equipment: None      Prior Function Prior Level of Function : Independent/Modified Independent                     Hand Dominance        Extremity/Trunk Assessment   Upper Extremity Assessment Upper Extremity Assessment: Overall WFL for tasks assessed    Lower Extremity Assessment Lower Extremity Assessment: Overall WFL for tasks assessed    Cervical / Trunk Assessment Cervical / Trunk Assessment: Normal  Communication   Communication:  (mild dysarthria vs accent, difficult to discern but likely baseline)  Cognition Arousal/Alertness: Awake/alert Behavior During Therapy: WFL for tasks assessed/performed Overall Cognitive Status: History of cognitive impairments - at baseline                                 General Comments: participates and follows commands well during session        General Comments  General comments (skin integrity, edema, etc.): VSS on RA    Exercises     Assessment/Plan    PT Assessment Patient does not need any further PT services  PT Problem List         PT Treatment Interventions      PT Goals (Current goals can be found in the Care Plan section)       Frequency       Co-evaluation               AM-PAC PT "6 Clicks" Mobility  Outcome Measure Help needed turning from your back to your side while in a flat bed without using bedrails?: None Help needed moving from lying on your back to sitting on the side of a flat bed without using bedrails?: None Help needed moving to and from a bed to a chair (including a wheelchair)?: None Help needed standing up from a  chair using your arms (e.g., wheelchair or bedside chair)?: None Help needed to walk in hospital room?: None Help needed climbing 3-5 steps with a railing? : None 6 Click Score: 24    End of Session   Activity Tolerance: Patient tolerated treatment well Patient left: in bed;with call bell/phone within reach Nurse Communication: Mobility status PT Visit Diagnosis: Adult, failure to thrive (R62.7)    Time: 1520-1535 PT Time Calculation (min) (ACUTE ONLY): 15 min   Charges:   PT Evaluation $PT Eval Low Complexity: 1 Low   PT General Charges $$ ACUTE PT VISIT: 1 Visit         Arlyss Gandy, PT, DPT Acute Rehabilitation Office [phone]   Arlyss Gandy 06/04/2023, 4:02 PM

## 2023-06-04 NOTE — Progress Notes (Signed)
Patient ID: Randy Merritt, male   DOB: [birth year], 82 y.o.   MRN: [id number] Will discuss with Dr Gerrit Friends, ENT has already seen patient and left recs that I would follow.

## 2023-06-04 NOTE — Progress Notes (Signed)
PROGRESS NOTE  Randy Merritt UJW:[id number] DOB: [birth year]   PCP: Lorenda Ishihara, MD  Patient is from: Home. Lives with wife  DOA: 06/03/2023 LOS: 0  Chief complaints Chief Complaint  Patient presents with   Dysphagia   Chest Pain     Brief Narrative / Interim history: 82 yo M with PMH of cervical radiculopathy s/p ACDF in 2014, nontoxic goiter, HTN, dysphagia, CKD-3A, memory loss, chronic pain, anxiety and depression presenting with progressive dysphagia.  Patient is not a great historian.  Reportedly had progressive dysphagia over 2 weeks with poor p.o. intake and about 15 pound weight loss.  Patient's wife reports some dysphagia since he had his neck surgery.  He has been evaluated by [facility] outpatient.  Seems like he had EGD in June 2024 that showed narrowing UES from possible extrinsic compression from thyroid +/- cervical osteophytes).  EGD also showed possible subtle narrowing of LES.  Patient is also followed by general surgery, Dr. Durwin Nora for enlarging nontoxic goiter.  In ED, stable vitals.  100% on RA.  Labs with leukopenia and mild anemia as well as elevated anion gap without acidosis. CT soft tissue neck showed unchanged large right and smaller left thyroid nodules with regional mass effect resulting in mild leftward displacement and narrowing of the infraglottic airway, and prior C3-C4 and C5-C6 ACDF without evidence of complication, and bulky anterior osteophytes throughout the cervical spine are unchanged, and bulky bilateral stylohyoid ligament calcification is unchanged.  ENT, GI and general surgery consulted.  ENT recommended reevaluation by SLP, GI and spine surgery reevaluation.  Per ENT, may be a candidate for Botox injection of cricopharyngeus, which can be performed by Laryngology in [place].    General surgery following.  Neurosurgery consult requested.   Subjective: Seen and examined earlier this morning.  No major events overnight of this morning.   Continues to endorse difficulty swallowing and increased secretions. Per wife he has been spitting up a lot of white foamy phlegm for quite some times.  Denies shortness of breath or chest pain.  Per wife, dysphagia has been ongoing since he had neck surgery.  Objective: Vitals:   06/03/23 2015 06/03/23 2034 06/03/23 2309 06/04/23 0436  BP: (!) 155/86 (!) 155/86 (!) 159/95 (!) 160/85  Pulse: 66 66 79 84  Resp:  19 18 16   Temp:  (!) 97.4 F (36.3 C) 97.8 F (36.6 C) 98.3 F (36.8 C)  TempSrc:  Oral Oral Oral  SpO2: 100% 100% 100% 100%  Weight:      Height:        Examination:  GENERAL: No apparent distress.  Nontoxic. HEENT: MMM.  Vision and hearing grossly intact.  NECK: Thyromegaly bilaterally.   RESP:  No IWOB.  Fair aeration bilaterally. CVS:  RRR. Heart sounds normal.  ABD/GI/GU: BS+. Abd soft, NTND.  MSK/EXT:  Moves extremities. No apparent deformity. No edema.  SKIN: no apparent skin lesion or wound NEURO: Awake, alert and oriented appropriately.  No apparent focal neuro deficit. PSYCH: Calm. Normal affect.   Procedures:  None  Microbiology summarized: None  Assessment and plan: Principal Problem:   Dysphagia Active Problems:   Non-toxic multinodular goiter   CKD (chronic kidney disease) stage 3, GFR 30-59 ml/min (HCC)   Essential (primary) hypertension   Cervical radiculopathy   History of cervical spinal arthrodesis   Memory loss   Anxiety and depression  Dysphagia: Unclear etiology but concerned about extrinsic compression in the setting of thyromegaly and cervical exabytes.  Patient is not  a great historian.  Per wife, ongoing dysphagia since he has neck surgery but gotten worse in the last 2 weeks.  No airway compromise.  Per ENT, may be a candidate for Botox injection of cricopharyngeus, which can be performed by Laryngology in [place].  Per GI, EGD showed narrowing UES and possible subtle narrowing LES. Suspect UES findings are from extrinsic  compression (thyroid +/- cervical osteophytes). Patient had esophageal dilatation to 16mm about one month ago with NO improvement. Do not think patient's dysphagia is an esophageal process but, rather, a matter of extrinsic compression of the esophagus.  Per neurosurgery Dr. Maisie Fus, no success with osteophytectomy even if we think osteophytes are contributing to dysphagia although less likely. -SLP recommended n.p.o. pending swallowing evaluation. -Follow general surgery recommendation -Start D5-NS at 100 cc an hour while n.p.o. -IV Protonix 40 mg twice daily  Non-toxic multinodular goiter: Goiter is confirmed to be causing extrinsic compression of airway on multiple CT scans now. -Check TSH and free T4 -Defer to general surgery and ENT   Essential hypertension: Normotensive -Holding p.o. meds while n.p.o. -IV hydralazine as needed   CKD-3A: Stable -Monitor  Anxiety and depression: Feels anxious. -Klonopin ODT 0.5 mg once until able to swallow safely.  Has alprazolam listed as allergy but does not recall  History of cervical radiculopathy s/p ACDF in 2014: Stable on CT  GERD -IV PPI as above.    Body mass index is 22.05 kg/m.           DVT prophylaxis:  enoxaparin (LOVENOX) injection 40 mg Start: 06/03/23 2200  Code Status: Full code Family Communication: Updated patient's wife at bedside Level of care: Med-Surg Status is: Observation The patient will require care spanning > 2 midnights and should be moved to inpatient because: Due to dysphagia requiring IV fluid while n.p.o.   Final disposition: Likely home Consultants:  ENT General surgery GI Neurosurgery  55 minutes with more than 50% spent in reviewing records, counseling patient/family and coordinating care.   Sch Meds:  Scheduled Meds:  clonazepam  0.5 mg Oral Once   enoxaparin (LOVENOX) injection  40 mg Subcutaneous Q24H   pantoprazole (PROTONIX) IV  40 mg Intravenous Q12H   Continuous Infusions:   dextrose 5% lactated ringers 100 mL/hr at 06/04/23 1024   PRN Meds:.hydrALAZINE, ondansetron **OR** ondansetron (ZOFRAN) IV  Antimicrobials: Anti-infectives (From admission, onward)    None        I have personally reviewed the following labs and images: CBC: Recent Labs  Lab 06/03/23 1139 06/04/23 0245  WBC 2.3* 4.0  HGB 12.7* 12.6*  HCT 38.9* 37.1*  MCV 94.9 91.8  PLT 152 133*   BMP &GFR Recent Labs  Lab 06/03/23 1139 06/04/23 0245  NA 143 140  K 4.3 3.7  CL 97* 106  CO2 28 21*  GLUCOSE 100* 64*  BUN 11 16  CREATININE 1.30* 1.27*  CALCIUM 10.0 9.2   Estimated Creatinine Clearance: 41.7 mL/min (A) (by C-G formula based on SCr of 1.27 mg/dL (H)). Liver & Pancreas: No results for input(s): "AST", "ALT", "ALKPHOS", "BILITOT", "PROT", "ALBUMIN" in the last 168 hours. No results for input(s): "LIPASE", "AMYLASE" in the last 168 hours. No results for input(s): "AMMONIA" in the last 168 hours. Diabetic: No results for input(s): "HGBA1C" in the last 72 hours. No results for input(s): "GLUCAP" in the last 168 hours. Cardiac Enzymes: No results for input(s): "CKTOTAL", "CKMB", "CKMBINDEX", "TROPONINI" in the last 168 hours. No results for input(s): "PROBNP" in the last  8760 hours. Coagulation Profile: No results for input(s): "INR", "PROTIME" in the last 168 hours. Thyroid Function Tests: No results for input(s): "TSH", "T4TOTAL", "FREET4", "T3FREE", "THYROIDAB" in the last 72 hours. Lipid Profile: No results for input(s): "CHOL", "HDL", "LDLCALC", "TRIG", "CHOLHDL", "LDLDIRECT" in the last 72 hours. Anemia Panel: No results for input(s): "VITAMINB12", "FOLATE", "FERRITIN", "TIBC", "IRON", "RETICCTPCT" in the last 72 hours. Urine analysis:    Component Value Date/Time   COLORURINE YELLOW 05/21/2023 1007   APPEARANCEUR CLEAR 05/21/2023 1007   LABSPEC 1.008 05/21/2023 1007   PHURINE 7.0 05/21/2023 1007   GLUCOSEU NEGATIVE 05/21/2023 1007   HGBUR NEGATIVE  05/21/2023 1007   BILIRUBINUR NEGATIVE 05/21/2023 1007   KETONESUR NEGATIVE 05/21/2023 1007   PROTEINUR NEGATIVE 05/21/2023 1007   UROBILINOGEN 0.2 05/17/2021 0852   NITRITE NEGATIVE 05/21/2023 1007   LEUKOCYTESUR NEGATIVE 05/21/2023 1007   Sepsis Labs: Invalid input(s): "PROCALCITONIN", "LACTICIDVEN"  Microbiology: No results found for this or any previous visit (from the past 240 hour(s)).  Radiology Studies: CT Soft Tissue Neck W Contrast  Result Date: 06/03/2023 CLINICAL DATA:  Thyroid nodule. EXAM: CT NECK WITH CONTRAST TECHNIQUE: Multidetector CT imaging of the neck was performed using the standard protocol following the bolus administration of intravenous contrast. RADIATION DOSE REDUCTION: This exam was performed according to the departmental dose-optimization program which includes automated exposure control, adjustment of the mA and/or kV according to patient size and/or use of iterative reconstruction technique. CONTRAST:  75mL OMNIPAQUE IOHEXOL 350 MG/ML SOLN COMPARISON:  CT neck 04/30/2023, thyroid ultrasound 10/17/2022 FINDINGS: Pharynx and larynx: The nasal cavity and nasopharynx are unremarkable. The oral cavity and oropharynx are unremarkable. The parapharyngeal spaces are clear. The hypopharynx and larynx are unremarkable. The vocal folds are normal in appearance. The epiglottis is normal. There is no retropharyngeal collection. The airway is patent. Salivary glands: The parotid and submandibular glands are unremarkable. Thyroid: The large right thyroid nodule measuring up 2 4.7 cm AP x 4.6 cm TV x 6.5 cm cc and left thyroid nodule measuring 2.8 cm x 1.6 cm x 3.9 cm are not significantly changed, with foci of calcification on the right. There is unchanged mass effect with leftward deviation and mild narrowing of the airway. These nodules has been previously evaluated by ultrasound and biopsy. Lymph nodes: There is no pathologic lymphadenopathy in the neck. Vascular: Unremarkable.  Limited intracranial: Unremarkable. Visualized orbits: Unremarkable. Mastoids and visualized paranasal sinuses: Clear. Skeleton: Postsurgical changes reflecting C5-C6 ACDF are again noted, without evidence of complication. There is also fusion across the C3-C4 disc space. Bulky anterior osteophytes throughout the cervical spine are unchanged. There is no new acute osseous abnormality or suspicious osseous lesion. Bulky bilateral stylohyoid ligament calcification is unchanged. Upper chest: The imaged lung apices are clear. Other: None. IMPRESSION: 1. Unchanged large right and smaller left thyroid nodules with regional mass effect resulting in mild leftward displacement and narrowing of the infraglottic airway. These nodules has been previously evaluated by ultrasound and biopsy. 2. No new or acute finding in the neck. Electronically Signed   By: Lesia Hausen M.D.   On: 06/03/2023 15:41   DG Chest 2 View  Result Date: 06/03/2023 CLINICAL DATA:  Shortness of breath EXAM: CHEST - 2 VIEW COMPARISON:  X-ray 05/21/2023 and older FINDINGS: Hyperinflation. No consolidation, pneumothorax or effusion. Normal cardiopericardial silhouette without edema. Surgical clips in the right upper quadrant. Fixation hardware along the lower cervical spine. There is some narrowing of the trachea with shift from right-to-left at the thoracic inlet.  Etiology is uncertain. Please correlate with prior neck CT scan of 04/30/2023 consistent with an enlarged thyroid gland. IMPRESSION: Hyperinflation.  No acute cardiopulmonary disease. Electronically Signed   By: Karen Kays M.D.   On: 06/03/2023 13:05      Sylus Stgermain T. [facility]  If 7PM-7AM, please contact night-coverage [email or web address] 06/04/2023, 11:07 AM

## 2023-06-04 NOTE — Plan of Care (Signed)
°  Problem: Education: °Goal: Knowledge of General Education information will improve °Description: Including pain rating scale, medication(s)/side effects and non-pharmacologic comfort measures °Outcome: Progressing °  °Problem: Health Behavior/Discharge Planning: °Goal: Ability to manage health-related needs will improve °Outcome: Progressing °  °Problem: Clinical Measurements: °Goal: Ability to maintain clinical measurements within normal limits will improve °Outcome: Progressing °Goal: Will remain free from infection °Outcome: Progressing °Goal: Diagnostic test results will improve °Outcome: Progressing °  °Problem: Activity: °Goal: Risk for activity intolerance will decrease °Outcome: Progressing °  °Problem: Nutrition: °Goal: Adequate nutrition will be maintained °Outcome: Progressing °  °Problem: Coping: °Goal: Level of anxiety will decrease °Outcome: Progressing °  °Problem: Pain Managment: °Goal: General experience of comfort will improve °Outcome: Progressing °  °Problem: Safety: °Goal: Ability to remain free from injury will improve °Outcome: Progressing °  °Problem: Skin Integrity: °Goal: Risk for impaired skin integrity will decrease °Outcome: Progressing °  °

## 2023-06-04 NOTE — Evaluation (Signed)
Clinical/Bedside Swallow Evaluation Patient Details  Name: Randy Merritt MRN: [id number] Date of Birth: 17-Jul-1941  Today's Date: 06/04/2023 Time: SLP Start Time (ACUTE ONLY): 0848 SLP Stop Time (ACUTE ONLY): 0927 SLP Time Calculation (min) (ACUTE ONLY): 39 min  Past Medical History:  Past Medical History:  Diagnosis Date   Acid reflux    Anxiety    Arthritis    Depression    " a little"   Goiter    High cholesterol    History of blood transfusion    Perforated bowel (HCC)    Seasonal allergies    Past Surgical History:  Past Surgical History:  Procedure Laterality Date   ANTERIOR CERVICAL DECOMP/DISCECTOMY FUSION N/A 04/08/2013   Procedure: ANTERIOR CERVICAL DECOMPRESSION/DISCECTOMY FUSION 1 LEVEL;  Surgeon: Karn Cassis, MD;  Location: [facility];  Service: Neurosurgery;  Laterality: N/A;  Cervical five-six Anterior cervical decompression/diskectomy fusion   APPENDECTOMY N/A 10/06/2019   Procedure: APPENDECTOMY;  Surgeon: Berna Bue, MD;  Location: [facility] OR;  Service: General;  Laterality: N/A;   BIOPSY  01/30/2019   Procedure: BIOPSY;  Surgeon: Jeani Hawking, MD;  Location: [facility];  Service: Endoscopy;;   CERVICAL DISCECTOMY     x2   CHOLECYSTECTOMY     COLON RESECTION SIGMOID N/A 10/06/2019   Procedure: COLON RESECTION SIGMOID;  Surgeon: Berna Bue, MD;  Location: [facility] OR;  Service: General;  Laterality: N/A;   ESOPHAGEAL DILATION  01/30/2019   Procedure: ESOPHAGEAL DILATION;  Surgeon: Jeani Hawking, MD;  Location: [facility];  Service: Endoscopy;;  [place]   ESOPHAGOGASTRODUODENOSCOPY (EGD) WITH PROPOFOL N/A 01/30/2019   Procedure: ESOPHAGOGASTRODUODENOSCOPY (EGD) WITH PROPOFOL;  Surgeon: Jeani Hawking, MD;  Location: [facility];  Service: Endoscopy;  Laterality: N/A;   gallstone removal     LAPAROTOMY N/A 10/06/2019   Procedure: EXPLORATORY LAPAROTOMY;  Surgeon: Berna Bue, MD;  Location: [facility] OR;  Service: General;  Laterality: N/A;   HPI:   Pt is an 82 yo male presenting with progressively worsening dysphagia x2 weeks. Per H&P note, pt reports having to spit up his secretions and 15 lb weight loss due to decreased PO intake. CT Soft Tissue Neck with unchanged large R and smaller L thyroid nodules with regional mass effect resulting in mild leftward displacement and narrowing of infraglottic airway. MBS 12/05/22 with "mild oral and moderate pharyngo-cervical esophageal dysphagia". At that time, recommended diet of Dys 3 with thin liquids using a liquid wash and intermittent dry swallows. Per [facility] H[facility] consult note, pt may be a candidate for thyroidectomy or botox injection of cricopharyngeus. Per GI note, endoscopy showed narrowing UES and possible subtle narrowing LES with suspected findings from extrinsic compression (thyroid +/- cervical osteophytes). Pt had esophageal dilation to 16 mm ~1 month ago with no improvement. PMH includes chronic pain, memory loss, depression, thrombocytopenia, cervical radiculopathy, HTN, aphasia, globus pharyngeus, goiter, CKD, HLD, oropharyngeal dysphagia, GERD    Assessment / Plan / Recommendation  Clinical Impression  Pt reports ongoing dysphagia requiring him to only eat pureed or soft foods. He reports experiencing a globus sensation regardless of texture and frequently having to expectorate secretions throughout the day. Oral motor exam WFL, although note missing lower dentition. Pt given cup sips of water and small teaspoons of purees with no overt s/s of oral dysphagia. As trials progressed, pt reported an increasing globus sensation which prompted pt to have a delayed throat clear. He then expectorated secretions, which had no trace of applesauce, but were white and frothy  in quality. He states that his secretions are consistently of this nature. Pt demonstrated compensatory strategies that he has implemented, such as a head turn to the R, intermittent throat clears, liquid washes, and dry swallows. These  appeared to improve his ability to trigger a swallow and subjectively he reports that using these strategies is helpful, although they did not resolve the globus sensation he experienced after PO trials. Suspect pt may be at risk of aspiration during the swallow due to anatomical differences as well as potentially after the swallow due to possible retention. SLP provided education regarding potential for a repeat MBS, with which pt and his wife verbalized agreement. Will attempt to schedule MBS as radiology scheduling allows. Recommend pt remain NPO pending MBS results, although is capable of swallowing meds crushed in puree if necessary and only after thorough oral care.  SLP Visit Diagnosis: Dysphagia, unspecified (R13.10)    Aspiration Risk  Moderate aspiration risk    Diet Recommendation NPO    Medication Administration: Crushed with puree    Other  Recommendations Oral Care Recommendations: Oral care QID    Recommendations for follow up therapy are one component of a multi-disciplinary discharge planning process, led by the attending physician.  Recommendations may be updated based on patient status, additional functional criteria and insurance authorization.  Follow up Recommendations Outpatient SLP      Assistance Recommended at Discharge    Functional Status Assessment Patient has had a recent decline in their functional status and demonstrates the ability to make significant improvements in function in a reasonable and predictable amount of time.  Frequency and Duration min 2x/week  2 weeks       Prognosis Prognosis for improved oropharyngeal function: Good Barriers to Reach Goals: Time post onset;Severity of deficits      Swallow Study   General HPI: Pt is an 82 yo male presenting with progressively worsening dysphagia x2 weeks. Per H&P note, pt reports having to spit up his secretions and 15 lb weight loss due to decreased PO intake. CT Soft Tissue Neck with unchanged large  R and smaller L thyroid nodules with regional mass effect resulting in mild leftward displacement and narrowing of infraglottic airway. MBS 12/05/22 with "mild oral and moderate pharyngo-cervical esophageal dysphagia". At that time, recommended diet of Dys 3 with thin liquids using a liquid wash and intermittent dry swallows. Per [facility] consult note, pt may be a candidate for thyroidectomy or botox injection of cricopharyngeus. Per GI note, endoscopy showed narrowing UES and possible subtle narrowing LES with suspected findings from extrinsic compression (thyroid +/- cervical osteophytes). Pt had esophageal dilation to 16 mm ~1 month ago with no improvement. PMH includes chronic pain, memory loss, depression, thrombocytopenia, cervical radiculopathy, HTN, aphasia, globus pharyngeus, goiter, CKD, HLD, oropharyngeal dysphagia, GERD Type of Study: Bedside Swallow Evaluation Previous Swallow Assessment: see HPI Diet Prior to this Study: NPO Temperature Spikes Noted: No Respiratory Status: Room air History of Recent Intubation: No Behavior/Cognition: Alert;Cooperative;Pleasant mood Oral Cavity Assessment: Within Functional Limits Oral Care Completed by SLP: No Oral Cavity - Dentition: Missing dentition Vision: Functional for self-feeding Self-Feeding Abilities: Able to feed self Patient Positioning: Upright in bed Baseline Vocal Quality: Normal Volitional Cough: Strong Volitional Swallow: Able to elicit    Oral/Motor/Sensory Function Overall Oral Motor/Sensory Function: Within functional limits   Ice Chips     Thin Liquid Thin Liquid: Impaired Presentation: Cup;Self Fed Pharyngeal  Phase Impairments: Multiple swallows;Wet Vocal Quality;Throat Clearing - Delayed    Nectar  Thick     Honey Thick     Puree Puree: Impaired Presentation: Self Fed;Spoon Pharyngeal Phase Impairments: Multiple swallows;Wet Vocal Quality;Throat Clearing - Delayed   Solid            Gwynneth Aliment, M.A.,  CF-SLP Speech Language Pathology, Acute Rehabilitation Services  Secure Chat preferred [phone]  06/04/2023,9:51 AM

## 2023-06-04 NOTE — Progress Notes (Signed)
Neurosurgery  82 yo M with hx of C5-6 ACDF remotely who has progressive dysphagia.  He was found to have a large compressive goiter but also diffuse cervical spondylosis.  His largest anterior osteophyte is at C2-3, eccentric to the left but does not overtly look compressive on the posterior pharynx.  I do not recommend osteophytectomy as it would not offer benefit and most likely outcome would be worsened dysphagia.

## 2023-06-04 NOTE — Consult Note (Signed)
Reason for Consult:thyroid mass Referring Physician: [name] is an 82 y.o. male.  HPI: 82yo with a history of enlarging nontoxic multinodular thyroid goiter was seen by Dr. Gerrit Merritt at our practice 04/01/23. He was having some dysphagia at that time. He was referred for EGD by [facility] and for CT scan of the neck. Plan was for F/U in 6 weeks. He presented to the ED today with progressively worsening dysphagia over the past 2 weeks, now having to spit out secretions. CT neck today shows large bilatweral thyroid nodules with mass effect compressing the infraglottic airway. He was admitted to the Hospitalist Service.   Past Medical History:  Diagnosis Date   Acid reflux    Anxiety    Arthritis    Depression    " a little"   Goiter    High cholesterol    History of blood transfusion    Perforated bowel (HCC)    Seasonal allergies     Past Surgical History:  Procedure Laterality Date   ANTERIOR CERVICAL DECOMP/DISCECTOMY FUSION N/A 04/08/2013   Procedure: ANTERIOR CERVICAL DECOMPRESSION/DISCECTOMY FUSION 1 LEVEL;  Surgeon: Karn Cassis, MD;  Location: [facility];  Service: Neurosurgery;  Laterality: N/A;  Cervical five-six Anterior cervical decompression/diskectomy fusion   APPENDECTOMY N/A 10/06/2019   Procedure: APPENDECTOMY;  Surgeon: Berna Bue, MD;  Location: [facility] OR;  Service: General;  Laterality: N/A;   BIOPSY  01/30/2019   Procedure: BIOPSY;  Surgeon: Jeani Hawking, MD;  Location: [facility];  Service: Endoscopy;;   CERVICAL DISCECTOMY     x2   CHOLECYSTECTOMY     COLON RESECTION SIGMOID N/A 10/06/2019   Procedure: COLON RESECTION SIGMOID;  Surgeon: Berna Bue, MD;  Location: [facility] OR;  Service: General;  Laterality: N/A;   ESOPHAGEAL DILATION  01/30/2019   Procedure: ESOPHAGEAL DILATION;  Surgeon: Jeani Hawking, MD;  Location: [facility];  Service: Endoscopy;;  [place]   ESOPHAGOGASTRODUODENOSCOPY (EGD) WITH PROPOFOL N/A 01/30/2019    Procedure: ESOPHAGOGASTRODUODENOSCOPY (EGD) WITH PROPOFOL;  Surgeon: Jeani Hawking, MD;  Location: [facility];  Service: Endoscopy;  Laterality: N/A;   gallstone removal     LAPAROTOMY N/A 10/06/2019   Procedure: EXPLORATORY LAPAROTOMY;  Surgeon: Berna Bue, MD;  Location: [facility] OR;  Service: General;  Laterality: N/A;    Family History  Problem Relation Age of Onset   Healthy Mother    Healthy Father    Hypertension Sister    Hypertension Sister    Diabetes Sister    Hypertension Brother    Thyroid disease Neg Hx     Social History:  reports that he quit smoking about 64 years ago. His smoking use included cigarettes. He started smoking about 65 years ago. He has a 0.3 pack-year smoking history. He has never used smokeless tobacco. He reports that he does not drink alcohol and does not use drugs.  Allergies:  Allergies  Allergen Reactions   Alprazolam Other (See Comments)   Cyclobenzaprine Other (See Comments)   Prednisone Palpitations and Other (See Comments)    Medications: I have reviewed the patient's current medications.  Results for orders placed or performed during the hospital encounter of 06/03/23 (from the past 48 hour(s))  Basic metabolic panel     Status: Abnormal   Collection Time: 06/03/23 11:39 AM  Result Value Ref Range   Sodium 143 135 - 145 mmol/L   Potassium 4.3 3.5 - 5.1 mmol/L   Chloride 97 (L) 98 - 111 mmol/L  CO2 28 22 - 32 mmol/L   Glucose, Bld 100 (H) 70 - 99 mg/dL    Comment: Glucose reference range applies only to samples taken after fasting for at least 8 hours.   BUN 11 8 - 23 mg/dL   Creatinine, Ser 9.56 (H) 0.61 - 1.24 mg/dL   Calcium 21.3 8.9 - 08.6 mg/dL   GFR, Estimated 55 (L) >60 mL/min    Comment: (NOTE) Calculated using the CKD-EPI Creatinine Equation (2021)    Anion gap 18 (H) 5 - 15    Comment: Performed at [facility], [address], [place], [place] [address]  CBC     Status: Abnormal   Collection Time: 06/03/23  11:39 AM  Result Value Ref Range   WBC 2.3 (L) 4.0 - 10.5 K/uL   RBC 4.10 (L) 4.22 - 5.81 MIL/uL   Hemoglobin 12.7 (L) 13.0 - 17.0 g/dL   HCT 96.2 (L) 95.2 - 84.1 %   MCV 94.9 80.0 - 100.0 fL   MCH 31.0 26.0 - 34.0 pg   MCHC 32.6 30.0 - 36.0 g/dL   RDW 32.4 40.1 - 02.7 %   Platelets 152 150 - 400 K/uL   nRBC 0.0 0.0 - 0.2 %    Comment: Performed at [facility], [address] [place], [place] [address]  Troponin I (High Sensitivity)     Status: None   Collection Time: 06/03/23 11:39 AM  Result Value Ref Range   Troponin I (High Sensitivity) 13 <18 ng/L    Comment: (NOTE) Elevated high sensitivity troponin I (hsTnI) values and significant  changes across serial measurements may suggest ACS but many other  chronic and acute conditions are known to elevate hsTnI results.  Refer to the "Links" section for chest pain algorithms and additional  guidance. Performed at [facility], [address], [place], [place] [address]   Troponin I (High Sensitivity)     Status: None   Collection Time: 06/03/23  2:03 PM  Result Value Ref Range   Troponin I (High Sensitivity) 12 <18 ng/L    Comment: (NOTE) Elevated high sensitivity troponin I (hsTnI) values and significant  changes across serial measurements may suggest ACS but many other  chronic and acute conditions are known to elevate hsTnI results.  Refer to the "Links" section for chest pain algorithms and additional  guidance. Performed at [facility], [address] [place], [place] [address]     CT Soft Tissue Neck W Contrast  Result Date: 06/03/2023 CLINICAL DATA:  Thyroid nodule. EXAM: CT NECK WITH CONTRAST TECHNIQUE: Multidetector CT imaging of the neck was performed using the standard protocol following the bolus administration of intravenous contrast. RADIATION DOSE REDUCTION: This exam was performed according to the departmental dose-optimization program which includes automated exposure control, adjustment  of the mA and/or kV according to patient size and/or use of iterative reconstruction technique. CONTRAST:  75mL OMNIPAQUE IOHEXOL 350 MG/ML SOLN COMPARISON:  CT neck 04/30/2023, thyroid ultrasound 10/17/2022 FINDINGS: Pharynx and larynx: The nasal cavity and nasopharynx are unremarkable. The oral cavity and oropharynx are unremarkable. The parapharyngeal spaces are clear. The hypopharynx and larynx are unremarkable. The vocal folds are normal in appearance. The epiglottis is normal. There is no retropharyngeal collection. The airway is patent. Salivary glands: The parotid and submandibular glands are unremarkable. Thyroid: The large right thyroid nodule measuring up 2 4.7 cm AP x 4.6 cm TV x 6.5 cm cc and left thyroid nodule measuring  2.8 cm x 1.6 cm x 3.9 cm are not significantly changed, with foci of calcification on the right. There is unchanged mass effect with leftward deviation and mild narrowing of the airway. These nodules has been previously evaluated by ultrasound and biopsy. Lymph nodes: There is no pathologic lymphadenopathy in the neck. Vascular: Unremarkable. Limited intracranial: Unremarkable. Visualized orbits: Unremarkable. Mastoids and visualized paranasal sinuses: Clear. Skeleton: Postsurgical changes reflecting C5-C6 ACDF are again noted, without evidence of complication. There is also fusion across the C3-C4 disc space. Bulky anterior osteophytes throughout the cervical spine are unchanged. There is no new acute osseous abnormality or suspicious osseous lesion. Bulky bilateral stylohyoid ligament calcification is unchanged. Upper chest: The imaged lung apices are clear. Other: None. IMPRESSION: 1. Unchanged large right and smaller left thyroid nodules with regional mass effect resulting in mild leftward displacement and narrowing of the infraglottic airway. These nodules has been previously evaluated by ultrasound and biopsy. 2. No new or acute finding in the neck. Electronically Signed   By:  Lesia Hausen M.D.   On: 06/03/2023 15:41   DG Chest 2 View  Result Date: 06/03/2023 CLINICAL DATA:  Shortness of breath EXAM: CHEST - 2 VIEW COMPARISON:  X-ray 05/21/2023 and older FINDINGS: Hyperinflation. No consolidation, pneumothorax or effusion. Normal cardiopericardial silhouette without edema. Surgical clips in the right upper quadrant. Fixation hardware along the lower cervical spine. There is some narrowing of the trachea with shift from right-to-left at the thoracic inlet. Etiology is uncertain. Please correlate with prior neck CT scan of 04/30/2023 consistent with an enlarged thyroid gland. IMPRESSION: Hyperinflation.  No acute cardiopulmonary disease. Electronically Signed   By: Karen Kays M.D.   On: 06/03/2023 13:05    Review of Systems  Constitutional:  Positive for unexpected weight change.  HENT:  Positive for trouble swallowing.        Unable to swallow secretions  Eyes: Negative.   Respiratory: Negative.    Cardiovascular: Negative.   Gastrointestinal: Negative.   Endocrine: Negative.   Musculoskeletal: Negative.   Allergic/Immunologic: Negative.   Neurological: Negative.   Hematological: Negative.   Psychiatric/Behavioral: Negative.     Blood pressure (!) 159/95, pulse 79, temperature 97.8 F (36.6 C), temperature source Oral, resp. rate 18, height 5\' 8"  (1.727 m), weight 65.8 kg, SpO2 100%. Physical Exam HENT:     Head: Normocephalic.  Neck:     Thyroid: Thyromegaly present.     Comments: B lobes of thyroid enlarged Cardiovascular:     Rate and Rhythm: Normal rate and regular rhythm.  Pulmonary:     Effort: Pulmonary effort is normal. No respiratory distress.     Breath sounds: No wheezing.  Abdominal:     Palpations: Abdomen is soft.     Tenderness: There is no abdominal tenderness. There is no guarding or rebound.  Musculoskeletal:     Right lower leg: No edema.     Left lower leg: No edema.  Skin:    General: Skin is warm.     Capillary Refill:  Capillary refill takes 2 to 3 seconds.  Neurological:     Mental Status: He is alert and oriented to person, place, and time.     Assessment/Plan: Enlarging nontoxic multinodular thyroid goiter now with worsening compressive symptoms - will discuss surgical options with Dr. Gerrit Merritt and other team members. We will follow.   Liz Malady 06/04/2023, 12:49 AM

## 2023-06-04 NOTE — Consult Note (Signed)
[facility] Consultation Note  Referring Provider: [facility] Primary Care Physician:  Lorenda Ishihara, MD Primary Gastroenterologist:  Dr. Levora Angel  Reason for Consultation:  dysphagia  HPI: Randy Merritt is a 82 y.o. male admitted ongoing dysphagia and weight loss.  Ongoing, but worsening, problems for months.  Endoscopy June 2024 by Dr. 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  Prior neck surgery.  Osteophytes and thyromegaly with imaging suggsting mass effect.  Has lost weight due to progressive inability to eat solids, and now, liquids.   Past Medical History:  Diagnosis Date   Acid reflux    Anxiety    Arthritis    Depression    " a little"   Goiter    High cholesterol    History of blood transfusion    Perforated bowel (HCC)    Seasonal allergies     Past Surgical History:  Procedure Laterality Date   ANTERIOR CERVICAL DECOMP/DISCECTOMY FUSION N/A 04/08/2013   Procedure: ANTERIOR CERVICAL DECOMPRESSION/DISCECTOMY FUSION 1 LEVEL;  Surgeon: Karn Cassis, MD;  Location: [facility];  Service: Neurosurgery;  Laterality: N/A;  Cervical five-six Anterior cervical decompression/diskectomy fusion   APPENDECTOMY N/A 10/06/2019   Procedure: APPENDECTOMY;  Surgeon: Berna Bue, MD;  Location: [facility] OR;  Service: General;  Laterality: N/A;   BIOPSY  01/30/2019   Procedure: BIOPSY;  Surgeon: Jeani Hawking, MD;  Location: [facility];  Service: Endoscopy;;   CERVICAL DISCECTOMY     x2   CHOLECYSTECTOMY     COLON RESECTION SIGMOID N/A 10/06/2019   Procedure: COLON RESECTION SIGMOID;  Surgeon: Berna Bue, MD;  Location: [facility] OR;  Service: General;  Laterality: N/A;   ESOPHAGEAL DILATION  01/30/2019   Procedure: ESOPHAGEAL DILATION;  Surgeon: Jeani Hawking, MD;  Location: [facility];  Service: Endoscopy;;  [place]   ESOPHAGOGASTRODUODENOSCOPY (EGD) WITH  PROPOFOL N/A 01/30/2019   Procedure: ESOPHAGOGASTRODUODENOSCOPY (EGD) WITH PROPOFOL;  Surgeon: Jeani Hawking, MD;  Location: [facility];  Service: Endoscopy;  Laterality: N/A;   gallstone removal     LAPAROTOMY N/A 10/06/2019   Procedure: EXPLORATORY LAPAROTOMY;  Surgeon: Berna Bue, MD;  Location: [facility] OR;  Service: General;  Laterality: N/A;    Prior to Admission medications   Medication Sig Start Date End Date Taking? Authorizing Provider  acetaminophen (TYLENOL) 500 MG tablet Take 2 tablets (1,000 mg total) by mouth every 8 (eight) hours. Patient taking differently: Take 1,000 mg by mouth every 8 (eight) hours as needed. pain 10/15/19  Yes Sherrie George, PA-C  amoxicillin (AMOXIL) 875 MG tablet Take 875 mg by mouth 2 (two) times daily. 05/28/23  Yes [provider]  aspirin 81 MG chewable tablet Chew 81 mg by mouth daily.   Yes [provider]  buPROPion (WELLBUTRIN XL) 150 MG 24 hr tablet Take 150 mg by mouth every morning. 03/18/23  Yes [provider]  cetirizine (ZYRTEC) 10 MG tablet Take 10 mg by mouth daily. 12/17/22  Yes [provider]  esomeprazole (NEXIUM) 40 MG capsule Take 1 capsule (40 mg total) by mouth daily. 05/07/23  Yes Domenick Gong, MD  famotidine (PEPCID) 20 MG tablet Take 1 tablet (20 mg total) by mouth 2 (two) times daily. 03/30/22  Yes Kommor, [place], MD  finasteride (PROSCAR) 5 MG tablet Take 5 mg by mouth daily.   Yes [provider]  fluticasone (FLONASE) 50 MCG/ACT nasal spray Place 2 sprays into both nostrils daily.  Yes [provider]  hydrOXYzine (ATARAX) 50 MG tablet Take 25-50 mg by mouth 2 (two) times daily as needed. 05/30/23  Yes [provider]  metoprolol succinate (TOPROL-XL) 25 MG 24 hr tablet Take 25 mg by mouth daily. 07/20/21  Yes [provider]  QUEtiapine (SEROQUEL) 25 MG tablet Take 25 mg by mouth at bedtime. 05/30/23  Yes [provider]  Rosuvastatin Calcium  10 MG CPSP Take 10 mg by mouth daily. 05/26/21  Yes [provider]  tiZANidine (ZANAFLEX) 2 MG tablet Take 2 mg by mouth at bedtime as needed. 05/24/23  Yes [provider]  traZODone (DESYREL) 100 MG tablet Take 100 mg by mouth at bedtime as needed. 04/25/23  Yes [provider]  triamcinolone (KENALOG) 0.025 % ointment Apply 1 Application topically 2 (two) times daily.   Yes [provider]    Current Facility-Administered Medications  Medication Dose Route Frequency Provider Last Rate Last Admin   enoxaparin (LOVENOX) injection 40 mg  40 mg Subcutaneous Q24H Lyda Perone M, DO   40 mg at 06/03/23 2201   lactated ringers infusion   Intravenous Continuous Hillary Bow, DO 70 mL/hr at 06/03/23 2201 New Bag at 06/03/23 2201   ondansetron (ZOFRAN) tablet 4 mg  4 mg Oral Q6H PRN Hillary Bow, DO       Or   ondansetron [facility]) injection 4 mg  4 mg Intravenous Q6H PRN Hillary Bow, DO        Allergies as of 06/03/2023 - Review Complete 06/03/2023  Allergen Reaction Noted   Alprazolam Other (See Comments) 10/24/2020   Cyclobenzaprine Other (See Comments) 10/24/2020   Prednisone Palpitations and Other (See Comments) 10/11/2020    Family History  Problem Relation Age of Onset   Healthy Mother    Healthy Father    Hypertension Sister    Hypertension Sister    Diabetes Sister    Hypertension Brother    Thyroid disease Neg Hx     Social History   Socioeconomic History   Marital status: Married    Spouse name: Not on file   Number of children: 3   Years of education: Not on file   Highest education level: Not on file  Occupational History   Not on file  Tobacco Use   Smoking status: Former    Current packs/day: 0.00    Average packs/day: 0.3 packs/day for 1 year (0.3 ttl pk-yrs)    Types: Cigarettes    Start date: 61    Quit date: 1960    Years since quitting: 64.6   Smokeless tobacco: Never  Vaping Use   Vaping status: Never  Used  Substance and Sexual Activity   Alcohol use: No   Drug use: No   Sexual activity: Not on file  Other Topics Concern   Not on file  Social History Narrative   Not on file   Social Determinants of Health   Financial Resource Strain: Not on file  Food Insecurity: No Food Insecurity (07/25/2022)   Hunger Vital Sign    Worried About Running Out of Food in the Last Year: Never true    Ran Out of Food in the Last Year: Never true  Transportation Needs: No Transportation Needs (07/25/2022)   PRAPARE - Administrator, Civil Service (Medical): No    Lack of Transportation (Non-Medical): No  Physical Activity: Not on file  Stress: Not on file  Social Connections: Not on file  Intimate  Partner Violence: Not on file    Review of Systems: As per HPI, all others negative  Physical Exam: Vital signs in last 24 hours: Temp:  [97.1 F (36.2 C)-98.3 F (36.8 C)] 98.3 F (36.8 C) (07/23 0436) Pulse Rate:  [58-84] 84 (07/23 0436) Resp:  [16-26] 16 (07/23 0436) BP: (124-160)/(72-95) 160/85 (07/23 0436) SpO2:  [100 %] 100 % (07/23 0436) Weight:  [65.8 kg] 65.8 kg (07/22 1238)   General:   Alert,  Well-developed, well-nourished, pleasant and cooperative in NAD Head:  Normocephalic and atraumatic. Eyes:  Sclera clear, no icterus.   Conjunctiva pink. Ears:  Normal auditory acuity. Nose:  No deformity, discharge,  or lesions. Mouth:  No deformity or lesions.  Oropharynx pink & moist. Neck:  Supple; no masses; thyromegaly seen Lungs:  No respiratory distress Abdomen:  Soft, nontender and nondistended. No masses, hepatosplenomegaly or hernias noted. Normal bowel sounds, without guarding, and without rebound.     Msk:  Symmetrical without gross deformities. Normal posture. Pulses:  Normal pulses noted. Extremities:  Without clubbing or edema. Neurologic:  Alert and  oriented x4;  grossly normal neurologically. Skin:  Intact without significant lesions or rashes. Psych:  Alert  and cooperative. Normal mood and affect.   Lab Results: Recent Labs    06/03/23 1139 06/04/23 0245  WBC 2.3* 4.0  HGB 12.7* 12.6*  HCT 38.9* 37.1*  PLT 152 133*   BMET Recent Labs    06/03/23 1139 06/04/23 0245  NA 143 140  K 4.3 3.7  CL 97* 106  CO2 28 21*  GLUCOSE 100* 64*  BUN 11 16  CREATININE 1.30* 1.27*  CALCIUM 10.0 9.2   LFT No results for input(s): "PROT", "ALBUMIN", "AST", "ALT", "ALKPHOS", "BILITOT", "BILIDIR", "IBILI" in the last 72 hours. PT/INR No results for input(s): "LABPROT", "INR" in the last 72 hours.  Studies/Results: CT Soft Tissue Neck W Contrast  Result Date: 06/03/2023 CLINICAL DATA:  Thyroid nodule. EXAM: CT NECK WITH CONTRAST TECHNIQUE: Multidetector CT imaging of the neck was performed using the standard protocol following the bolus administration of intravenous contrast. RADIATION DOSE REDUCTION: This exam was performed according to the departmental dose-optimization program which includes automated exposure control, adjustment of the mA and/or kV according to patient size and/or use of iterative reconstruction technique. CONTRAST:  75mL OMNIPAQUE IOHEXOL 350 MG/ML SOLN COMPARISON:  CT neck 04/30/2023, thyroid ultrasound 10/17/2022 FINDINGS: Pharynx and larynx: The nasal cavity and nasopharynx are unremarkable. The oral cavity and oropharynx are unremarkable. The parapharyngeal spaces are clear. The hypopharynx and larynx are unremarkable. The vocal folds are normal in appearance. The epiglottis is normal. There is no retropharyngeal collection. The airway is patent. Salivary glands: The parotid and submandibular glands are unremarkable. Thyroid: The large right thyroid nodule measuring up 2 4.7 cm AP x 4.6 cm TV x 6.5 cm cc and left thyroid nodule measuring 2.8 cm x 1.6 cm x 3.9 cm are not significantly changed, with foci of calcification on the right. There is unchanged mass effect with leftward deviation and mild narrowing of the airway. These  nodules has been previously evaluated by ultrasound and biopsy. Lymph nodes: There is no pathologic lymphadenopathy in the neck. Vascular: Unremarkable. Limited intracranial: Unremarkable. Visualized orbits: Unremarkable. Mastoids and visualized paranasal sinuses: Clear. Skeleton: Postsurgical changes reflecting C5-C6 ACDF are again noted, without evidence of complication. There is also fusion across the C3-C4 disc space. Bulky anterior osteophytes throughout the cervical spine are unchanged. There is no new acute osseous abnormality or suspicious  osseous lesion. Bulky bilateral stylohyoid ligament calcification is unchanged. Upper chest: The imaged lung apices are clear. Other: None. IMPRESSION: 1. Unchanged large right and smaller left thyroid nodules with regional mass effect resulting in mild leftward displacement and narrowing of the infraglottic airway. These nodules has been previously evaluated by ultrasound and biopsy. 2. No new or acute finding in the neck. Electronically Signed   By: Lesia Hausen M.D.   On: 06/03/2023 15:41   DG Chest 2 View  Result Date: 06/03/2023 CLINICAL DATA:  Shortness of breath EXAM: CHEST - 2 VIEW COMPARISON:  X-ray 05/21/2023 and older FINDINGS: Hyperinflation. No consolidation, pneumothorax or effusion. Normal cardiopericardial silhouette without edema. Surgical clips in the right upper quadrant. Fixation hardware along the lower cervical spine. There is some narrowing of the trachea with shift from right-to-left at the thoracic inlet. Etiology is uncertain. Please correlate with prior neck CT scan of 04/30/2023 consistent with an enlarged thyroid gland. IMPRESSION: Hyperinflation.  No acute cardiopulmonary disease. Electronically Signed   By: Karen Kays M.D.   On: 06/03/2023 13:05    Impression:   Dysphagia.  Endoscopy showed narrowing UES and possible subtle narrowing LES.  Suspect UES findings are from extrinsic compression (thyroid +/- cervical osteophytes).   Patient had esophageal dilatation to 16mm about one month ago with NO improvement.  Do not think patient's dysphagia is an esophageal process but, rather, a matter of extrinsic compression of the esophagus.  Plan:   No further GI work-up or intervention is likely to be of benefit. Will need ongoing input from ENT and CCS and maybe neurosurgery (for possible osteophytes, prior neck surgery). [facility] will sign-off; please call with questions; thank you for the consultation.   LOS: 0 days   Christofer Shen M  06/04/2023, 9:05 AM  Cell [phone] If no answer or after 5 PM call [phone]

## 2023-06-05 ENCOUNTER — Inpatient Hospital Stay (HOSPITAL_COMMUNITY): Payer: Medicare HMO

## 2023-06-05 DIAGNOSIS — R1310 Dysphagia, unspecified: Principal | ICD-10-CM

## 2023-06-05 LAB — CBC
HCT: 32.2 % — ABNORMAL LOW (ref 39.0–52.0)
Hemoglobin: 11.2 g/dL — ABNORMAL LOW (ref 13.0–17.0)
MCH: 32.5 pg (ref 26.0–34.0)
MCHC: 34.8 g/dL (ref 30.0–36.0)
MCV: 93.3 fL (ref 80.0–100.0)
Platelets: 115 10*3/uL — ABNORMAL LOW (ref 150–400)
RBC: 3.45 MIL/uL — ABNORMAL LOW (ref 4.22–5.81)
RDW: 12.4 % (ref 11.5–15.5)
WBC: 2.8 10*3/uL — ABNORMAL LOW (ref 4.0–10.5)
nRBC: 0 % (ref 0.0–0.2)

## 2023-06-05 LAB — RENAL FUNCTION PANEL
Albumin: 3.2 g/dL — ABNORMAL LOW (ref 3.5–5.0)
Anion gap: 9 (ref 5–15)
BUN: 18 mg/dL (ref 8–23)
CO2: 24 mmol/L (ref 22–32)
Calcium: 8.7 mg/dL — ABNORMAL LOW (ref 8.9–10.3)
Chloride: 108 mmol/L (ref 98–111)
Creatinine, Ser: 1.26 mg/dL — ABNORMAL HIGH (ref 0.61–1.24)
GFR, Estimated: 57 mL/min — ABNORMAL LOW (ref >60)
Glucose, Bld: 105 mg/dL — ABNORMAL HIGH (ref 70–99)
Phosphorus: 3.3 mg/dL (ref 2.5–4.6)
Potassium: 3.4 mmol/L — ABNORMAL LOW (ref 3.5–5.1)
Sodium: 141 mmol/L (ref 135–145)

## 2023-06-05 LAB — MAGNESIUM: Magnesium: 2 mg/dL (ref 1.7–2.4)

## 2023-06-05 MED ORDER — POTASSIUM CHLORIDE 10 MEQ/100ML IV SOLN
10.0000 meq | INTRAVENOUS | Status: AC
  Administered 2023-06-05 (×2): 10 meq via INTRAVENOUS
  Filled 2023-06-05: qty 100

## 2023-06-05 MED ORDER — HYDROXYZINE HCL 25 MG PO TABS
25.0000 mg | ORAL_TABLET | Freq: Three times a day (TID) | ORAL | Status: DC | PRN
  Administered 2023-06-08: 25 mg via ORAL
  Filled 2023-06-05: qty 1

## 2023-06-05 MED ORDER — QUETIAPINE FUMARATE 50 MG PO TABS
25.0000 mg | ORAL_TABLET | Freq: Every day | ORAL | Status: DC
  Administered 2023-06-05 – 2023-06-10 (×6): 25 mg via ORAL
  Filled 2023-06-05 (×6): qty 1

## 2023-06-05 MED ORDER — POTASSIUM CHLORIDE 10 MEQ/100ML IV SOLN
INTRAVENOUS | Status: AC
  Filled 2023-06-05: qty 100

## 2023-06-05 NOTE — Hospital Course (Addendum)
Brief hospital course: 82 yo M with PMH of cervical radiculopathy s/p ACDF in 2014, nontoxic goiter, HTN, dysphagia, CKD-3A, memory loss, chronic pain, anxiety and depression presenting with progressive dysphagia.  Patient is not a great historian.  Reportedly had progressive dysphagia over 2 weeks with poor p.o. intake and about 15 pound weight loss.  Patient's wife reports some dysphagia since he had his neck surgery.  He has been evaluated by [facility] GI outpatient.  Seems like he had EGD in June 2024 that showed narrowing UES from possible extrinsic compression from thyroid +/- cervical osteophytes).  EGD also showed possible subtle narrowing of LES.  Patient is also followed by general surgery, Dr. Georgana Curio for enlarging nontoxic goiter.  Currently being ruled for dysphagia.  GI, general surgery, speech therapy consulted. Neurosurgery was also consulted for osteophytes and recommended no intervention necessary for now. GI currently signed off as suspect the dysphagia is more related to extrinsic compression. SLP evaluated patient, MBS completed on 7/24.  Patient is currently on dysphagia 3 diet. Family states that dysphagia could be due to to some new medications that the psychiatry started which might have led to some throat swelling.  Assessment and Plan: Dysphagia:  Most likely due to extrinsic compression. Extensive evaluation as above. Per family ongoing dysphagia since he has neck surgery but gotten worse in the last 2 weeks.   No airway compromise.   Per ENT, may be a candidate for Botox injection of cricopharyngeus, which can be performed by Laryngology in [place].   Per GI, EGD showed narrowing UES and possible subtle narrowing LES. Suspect UES findings are from extrinsic compression (thyroid +/- cervical osteophytes). Patient had esophageal dilatation to 16mm about one month ago with NO improvement.  Per neurosurgery Dr. Maisie Fus, no success with osteophytectomy even if we think  osteophytes are contributing to dysphagia although less likely. SLP took the patient for MBS.  Currently on dysphagia 3 diet. Suspect his dysphagia is more likely associated with psychological issues.  Per ENT as well as supportive GI as well as per general surgery patient does not appear to have any organic reason for severe dysphagia. Dysphagia is also reportedly intermittently rather than continuous. Dietitian consult.  Will consult psychiatry if patient fails calorie count.   Non-toxic multinodular goiter:  Goiter is confirmed to be causing extrinsic compression of airway on multiple CT scans now. TSH suppressed.  Free T4 normal. Currently does not have any evidence of hyperthyroidism. Will monitor   Essential hypertension: Normotensive Blood pressure stable.   CKD-3A: Stable Baseline serum creatinine around 1.4-1.3.  Currently improving. -Monitor   Anxiety and depression:  Insomnia Has alprazolam listed as allergy but does not recall Resuming home Seroquel Per chart review was to be on trazodone instead of Seroquel. Also if patient will be on Seroquel, might benefit from being on Zyprexa to help with appetite stimulation. Also resuming as needed hydroxyzine.   History of cervical radiculopathy s/p ACDF in 2014: Stable on CT   GERD Continue PPI as above.

## 2023-06-05 NOTE — Progress Notes (Signed)
Modified Barium Swallow Study  Patient Details  Name: Randy Merritt MRN: [id number] Date of Birth: 17-May-1941  Today's Date: 06/05/2023  Modified Barium Swallow completed.  Full report located under Chart Review in the Imaging Section.  History of Present Illness Pt is an 82 yo male presenting with progressively worsening dysphagia x2 weeks. Per H&P note, pt reports having to spit up his secretions and 15 lb weight loss due to decreased PO intake. CT Soft Tissue Neck with unchanged large R and smaller L thyroid nodules with regional mass effect resulting in mild leftward displacement and narrowing of infraglottic airway. MBS 12/05/22 with "mild oral and moderate pharyngo-cervical esophageal dysphagia". At that time, recommended diet of Dys 3 with thin liquids using a liquid wash and intermittent dry swallows. Per [facility] consult note, pt may be a candidate for thyroidectomy or botox injection of cricopharyngeus. Per GI note, endoscopy showed narrowing UES and possible subtle narrowing LES with suspected findings from extrinsic compression (thyroid +/- cervical osteophytes). Pt had esophageal dilation to 16 mm ~1 month ago with no improvement. PMH includes chronic pain, memory loss, depression, thrombocytopenia, cervical radiculopathy, HTN, aphasia, globus pharyngeus, goiter, CKD, HLD, oropharyngeal dysphagia, GERD   Clinical Impression Pt presents with a mild dysphagia primarily characterized by residue throughout his pharynx. Pt's suspected prominent osteophytes as well as cervical hardware present from prior ACDF results in signficant pharyngeal narrowing, which contributes to residue in the valleculae, pyriform sinuses, and posterior pharyngeal wall. Pt is independently initiating multiple swallows, which is mostly effective at clearing this residue. Pt exhibits excellent airway protection, maintaing complete laryngeal vestibule closure during the swallow. He had no penetration/aspiration throughout  all trials of thin liquids, nectar thick liquids, honey thick liquids, purees, or solids. The pill was given with thin liquids and flouro remained on to follow down the esophagus with complete and prompt clearance. Recommend initiating diet of Dys 3 textures with thin liquids and meds given whole with liquids. Will f/u to discuss results of MBS with pt and his wife and reinforce use of compensatory strategies such as multiple swallows, following bites with sips of liquids, and small bites/sips. Pt may benefit from continued ENT f/u for ongoing management of symptoms as his oropharyngeal swallow is overall WFL.  Factors that may increase risk of adverse event in presence of aspiration Randy Merritt 2021):    Swallow Evaluation Recommendations Recommendations: PO diet PO Diet Recommendation: Dysphagia 3 (Mechanical soft);Thin liquids (Level 0) Liquid Administration via: Cup;Straw Medication Administration: Whole meds with liquid Supervision: Patient able to self-feed Swallowing strategies  : Minimize environmental distractions;Slow rate;Small bites/sips Postural changes: Position pt fully upright for meals;Stay upright 30-60 min after meals Oral care recommendations: Oral care BID (2x/day) Recommended consults: Consider ENT consultation      Gwynneth Aliment, M.A., CF-SLP Speech Language Pathology, Acute Rehabilitation Services  Secure Chat preferred [phone]  06/05/2023,11:22 AM

## 2023-06-05 NOTE — Plan of Care (Signed)
Patient is AOX3. Forgetful. Went for MBS today. Still awaiting plan from sx standpoint. Patient tolerating diet. Wife was at bedside most of this shift. No pain. RA. VSS. Continuous IVF maintained.   Problem: Education: Goal: Knowledge of General Education information will improve Description: Including pain rating scale, medication(s)/side effects and non-pharmacologic comfort measures Outcome: Progressing   Problem: Health Behavior/Discharge Planning: Goal: Ability to manage health-related needs will improve Outcome: Progressing   Problem: Clinical Measurements: Goal: Ability to maintain clinical measurements within normal limits will improve Outcome: Progressing Goal: Will remain free from infection Outcome: Progressing Goal: Diagnostic test results will improve Outcome: Progressing Goal: Respiratory complications will improve Outcome: Progressing Goal: Cardiovascular complication will be avoided Outcome: Progressing   Problem: Activity: Goal: Risk for activity intolerance will decrease Outcome: Progressing   Problem: Nutrition: Goal: Adequate nutrition will be maintained Outcome: Progressing   Problem: Coping: Goal: Level of anxiety will decrease Outcome: Progressing   Problem: Elimination: Goal: Will not experience complications related to bowel motility Outcome: Progressing Goal: Will not experience complications related to urinary retention Outcome: Progressing   Problem: Pain Managment: Goal: General experience of comfort will improve Outcome: Progressing   Problem: Safety: Goal: Ability to remain free from injury will improve Outcome: Progressing   Problem: Skin Integrity: Goal: Risk for impaired skin integrity will decrease Outcome: Progressing

## 2023-06-05 NOTE — Progress Notes (Signed)
Speech Language Pathology Treatment: Dysphagia  Patient Details Name: Randy Merritt MRN: [id number] DOB: 03-Nov-1941 Today's Date: 06/05/2023 Time: 1324-4010 SLP Time Calculation (min) (ACUTE ONLY): 18 min  Assessment / Plan / Recommendation Clinical Impression  SLP conducted session to educate pt and his wife, [place], on MBS results and reinforce use of compensatory strategies. Wife eager to review images from [facility], which were shown and described in detail. Reinforced education regarding use of compensatory strategies including taking small bites, following bites with sips, and using multiple swallows. Recommending pt continue diet of Dys 3 and thin liquids using compensatory strategies PRN. SLP will f/u to check diet and reinforce education x1 as able. No SLP f/u is needed after discharge.    HPI HPI: Pt is an 82 yo male presenting with progressively worsening dysphagia x2 weeks. Per H&P note, pt reports having to spit up his secretions and 15 lb weight loss due to decreased PO intake. CT Soft Tissue Neck with unchanged large R and smaller L thyroid nodules with regional mass effect resulting in mild leftward displacement and narrowing of infraglottic airway. MBS 12/05/22 with "mild oral and moderate pharyngo-cervical esophageal dysphagia". At that time, recommended diet of Dys 3 with thin liquids using a liquid wash and intermittent dry swallows. Per [facility] consult note, pt may be a candidate for thyroidectomy or botox injection of cricopharyngeus. Per GI note, endoscopy showed narrowing UES and possible subtle narrowing LES with suspected findings from extrinsic compression (thyroid +/- cervical osteophytes). Pt had esophageal dilation to 16 mm ~1 month ago with no improvement. PMH includes chronic pain, memory loss, depression, thrombocytopenia, cervical radiculopathy, HTN, aphasia, globus pharyngeus, goiter, CKD, HLD, oropharyngeal dysphagia, GERD      SLP Plan  Continue with current plan of  care      Recommendations for follow up therapy are one component of a multi-disciplinary discharge planning process, led by the attending physician.  Recommendations may be updated based on patient status, additional functional criteria and insurance authorization.    Recommendations  Diet recommendations: Dysphagia 3 (mechanical soft);Thin liquid Liquids provided via: Cup;Straw Medication Administration: Whole meds with liquid Supervision: Patient able to self feed Compensations: Slow rate;Small sips/bites;Multiple dry swallows after each bite/sip;Follow solids with liquid Postural Changes and/or Swallow Maneuvers: Seated upright 90 degrees;Upright 30-60 min after meal                  Oral care BID   PRN Dysphagia, pharyngeal phase (R13.13)     Continue with current plan of care     Gwynneth Aliment, M.A., CF-SLP Speech Language Pathology, Acute Rehabilitation Services  Secure Chat preferred [phone]   06/05/2023, 12:06 PM

## 2023-06-05 NOTE — Progress Notes (Signed)
[facility] Progress Note Patient: Randy Merritt MWU:[id number] DOB: [birth year] DOA: 06/03/2023  DOS: the patient was seen and examined on 06/05/2023  Brief hospital course: 82 yo M with PMH of cervical radiculopathy s/p ACDF in 2014, nontoxic goiter, HTN, dysphagia, CKD-3A, memory loss, chronic pain, anxiety and depression presenting with progressive dysphagia.  Patient is not a great historian.  Reportedly had progressive dysphagia over 2 weeks with poor p.o. intake and about 15 pound weight loss.  Patient's wife reports some dysphagia since he had his neck surgery.  He has been evaluated by [facility] GI outpatient.  Seems like he had EGD in June 2024 that showed narrowing UES from possible extrinsic compression from thyroid +/- cervical osteophytes).  EGD also showed possible subtle narrowing of LES.  Patient is also followed by general surgery, Dr. Georgana Curio for enlarging nontoxic goiter.  Currently being ruled for dysphagia.  GI, general surgery, speech therapy consulted. Neurosurgery was also consulted for osteophytes and recommended no intervention necessary for now. GI currently signed off as suspect the dysphagia is more related to extrinsic compression. SLP evaluated patient, MBS completed on 7/24.  Patient is currently on dysphagia 3 diet. Family states that dysphagia could be due to to some new medications that the psychiatry started which might have led to some throat swelling.  Assessment and Plan: Dysphagia:  Most likely due to extrinsic compression. Extensive evaluation as above. Per family ongoing dysphagia since he has neck surgery but gotten worse in the last 2 weeks.   No airway compromise.   Per ENT, may be a candidate for Botox injection of cricopharyngeus, which can be performed by Laryngology in [place].   Per GI, EGD showed narrowing UES and possible subtle narrowing LES. Suspect UES findings are from extrinsic compression (thyroid +/- cervical osteophytes). Patient had  esophageal dilatation to 16mm about one month ago with NO improvement.  Per neurosurgery Dr. Maisie Fus, no success with osteophytectomy even if we think osteophytes are contributing to dysphagia although less likely. SLP took the patient for MBS.  Currently on dysphagia 3 diet. -Follow general surgery recommendation   Non-toxic multinodular goiter:  Goiter is confirmed to be causing extrinsic compression of airway on multiple CT scans now. TSH suppressed.  Free T4 normal. Currently does not have any evidence of hyperthyroidism. Will monitor   Essential hypertension: Normotensive Blood pressure stable.   CKD-3A: Stable Baseline serum creatinine around 1.4-1.3.  Currently seen. -Monitor   Anxiety and depression: Feels anxious. -Klonopin ODT 0.5 mg once until able to swallow safely.  Has alprazolam listed as allergy but does not recall Resuming home Seroquel. Also resuming as needed hydroxyzine.   History of cervical radiculopathy s/p ACDF in 2014: Stable on CT   GERD -IV PPI as above.    Subjective: No nausea no vomiting no fever no chills.  Requesting resumption of home anxiety medication.  Physical Exam: General: in Mild distress, No Rash Cardiovascular: S1 and S2 Present, No Murmur Respiratory: Good respiratory effort, Bilateral Air entry present. No Crackles, No wheezes Abdomen: Bowel Sound present, No tenderness Extremities: No edema Neuro: Alert and oriented x3, no new focal deficit  Data Reviewed: I have Reviewed nursing notes, Vitals, and Lab results. Since last encounter, pertinent lab results CBC and BMP   . I have ordered test including CBC and BMP  .   Disposition: Status is: Inpatient Remains inpatient appropriate because: Oral intake is adequate.  Place and maintain sequential compression device Start: 06/05/23 0742   Family Communication: Wife at  bedside Level of care: Med-Surg   Vitals:   06/04/23 1957 06/05/23 0543 06/05/23 0730 06/05/23 1518  BP: (!)  146/70 (!) 133/59 136/71 139/71  Pulse: 65 73 66 68  Resp: 16 17 17 18   Temp: 98.1 F (36.7 C) 98 F (36.7 C) 97.6 F (36.4 C) 98.8 F (37.1 C)  TempSrc: Oral Oral  Oral  SpO2: 100% 100% 100% 100%  Weight:      Height:         Author: Lynden Oxford, MD 06/05/2023 6:09 PM  Please look on [email or web address] to find out who is on call.

## 2023-06-05 NOTE — Evaluation (Signed)
Occupational Therapy Evaluation Patient Details Name: Randy Merritt MRN: [id number] DOB: [birth year] Today's Date: 06/05/2023   History of Present Illness 82 y.o. male presents to [facility] hospital on 06/03/2023 with progressive dysphagia and weight loss.CT shows goiter with regional mass effect, as well as bulky anterior osteophytes. PMH includes ACDF, HTN, CKDIII, memory loss, chronic pain, anxiety, depression.   Clinical Impression   Pt is at baseline Ind level of function with ADLs/selfcare and mobility using no AD. All education completed and no further acute OT services are indicated at this time. OT will sign off      Recommendations for follow up therapy are one component of a multi-disciplinary discharge planning process, led by the attending physician.  Recommendations may be updated based on patient status, additional functional criteria and insurance authorization.   Assistance Recommended at Discharge None  Patient can return home with the following      Functional Status Assessment  Patient has not had a recent decline in their functional status  Equipment Recommendations  None recommended by OT    Recommendations for Other Services       Precautions / Restrictions Precautions Precautions: None Restrictions Weight Bearing Restrictions: No      Mobility Bed Mobility Overal bed mobility: Independent                  Transfers Overall transfer level: Independent                        Balance Overall balance assessment: No apparent balance deficits (not formally assessed)                                         ADL either performed or assessed with clinical judgement   ADL Overall ADL's : Independent;At baseline                                       General ADL Comments: pt reports that he walks to bathroom with IV pole, bathes and shaves self     Vision Ability to See in Adequate Light: 0 Adequate Patient  Visual Report: No change from baseline       Perception     Praxis      Pertinent Vitals/Pain Pain Assessment Pain Assessment: No/denies pain Faces Pain Scale: No hurt     Hand Dominance Right   Extremity/Trunk Assessment Upper Extremity Assessment Upper Extremity Assessment: Overall WFL for tasks assessed   Lower Extremity Assessment Lower Extremity Assessment: Defer to PT evaluation   Cervical / Trunk Assessment Cervical / Trunk Assessment: Normal   Communication Communication Communication: No difficulties   Cognition Arousal/Alertness: Awake/alert Behavior During Therapy: WFL for tasks assessed/performed Overall Cognitive Status: History of cognitive impairments - at baseline                                       General Comments       Exercises     Shoulder Instructions      Home Living Family/patient expects to be discharged to:: Private residence Living Arrangements: Spouse/significant other Available Help at Discharge: Family;Available 24 hours/day Type of Home: House Home Access: Ramped entrance     Home  Layout: One level     Bathroom Shower/Tub: IT trainer: Standard     Home Equipment: None          Prior Functioning/Environment Prior Level of Function : Independent/Modified Independent               ADLs Comments: Ind with ADLs, IADLs, drives        OT Problem List: Other (comment)      OT Treatment/Interventions:      OT Goals(Current goals can be found in the care plan section) Acute Rehab OT Goals Patient Stated Goal: go home  OT Frequency:      Co-evaluation              AM-PAC OT "6 Clicks" Daily Activity     Outcome Measure Help from another person eating meals?: None Help from another person taking care of personal grooming?: None Help from another person toileting, which includes using toliet, bedpan, or urinal?: None Help from another person bathing  (including washing, rinsing, drying)?: None Help from another person to put on and taking off regular upper body clothing?: None Help from another person to put on and taking off regular lower body clothing?: None 6 Click Score: 24   End of Session    Activity Tolerance: Patient tolerated treatment well Patient left: Other (comment);with family/visitor present (sitting EOB eating)                   Time: 1152-1209 OT Time Calculation (min): 17 min Charges:  OT General Charges $OT Visit: 1 Visit OT Evaluation $OT Eval Low Complexity: 1 Low   Galen Manila 06/05/2023, 12:14 PM

## 2023-06-06 DIAGNOSIS — R1312 Dysphagia, oropharyngeal phase: Secondary | ICD-10-CM | POA: Diagnosis not present

## 2023-06-06 LAB — CBC WITH DIFFERENTIAL/PLATELET
Abs Immature Granulocytes: 0 10*3/uL (ref 0.00–0.07)
Basophils Absolute: 0 10*3/uL (ref 0.0–0.1)
Basophils Relative: 0 %
Eosinophils Absolute: 0.1 10*3/uL (ref 0.0–0.5)
Eosinophils Relative: 2 %
HCT: 35.4 % — ABNORMAL LOW (ref 39.0–52.0)
Hemoglobin: 12.1 g/dL — ABNORMAL LOW (ref 13.0–17.0)
Immature Granulocytes: 0 %
Lymphocytes Relative: 51 %
Lymphs Abs: 1.3 10*3/uL (ref 0.7–4.0)
MCH: 32.2 pg (ref 26.0–34.0)
MCHC: 34.2 g/dL (ref 30.0–36.0)
MCV: 94.1 fL (ref 80.0–100.0)
Monocytes Absolute: 0.2 10*3/uL (ref 0.1–1.0)
Monocytes Relative: 8 %
Neutro Abs: 1 10*3/uL — ABNORMAL LOW (ref 1.7–7.7)
Neutrophils Relative %: 39 %
Platelets: 124 10*3/uL — ABNORMAL LOW (ref 150–400)
RBC: 3.76 MIL/uL — ABNORMAL LOW (ref 4.22–5.81)
RDW: 12.5 % (ref 11.5–15.5)
WBC: 2.6 10*3/uL — ABNORMAL LOW (ref 4.0–10.5)
nRBC: 0 % (ref 0.0–0.2)

## 2023-06-06 LAB — BASIC METABOLIC PANEL
Anion gap: 7 (ref 5–15)
BUN: 6 mg/dL — ABNORMAL LOW (ref 8–23)
CO2: 27 mmol/L (ref 22–32)
Calcium: 9 mg/dL (ref 8.9–10.3)
Chloride: 108 mmol/L (ref 98–111)
Creatinine, Ser: 1.12 mg/dL (ref 0.61–1.24)
GFR, Estimated: >60 mL/min (ref >60)
Glucose, Bld: 124 mg/dL — ABNORMAL HIGH (ref 70–99)
Potassium: 3.4 mmol/L — ABNORMAL LOW (ref 3.5–5.1)
Sodium: 142 mmol/L (ref 135–145)

## 2023-06-06 LAB — PHOSPHORUS: Phosphorus: 3 mg/dL (ref 2.5–4.6)

## 2023-06-06 LAB — MAGNESIUM: Magnesium: 2 mg/dL (ref 1.7–2.4)

## 2023-06-06 MED ORDER — LORATADINE 10 MG PO TABS
10.0000 mg | ORAL_TABLET | Freq: Every day | ORAL | Status: DC
  Administered 2023-06-06 – 2023-06-08 (×3): 10 mg via ORAL
  Filled 2023-06-06 (×3): qty 1

## 2023-06-06 MED ORDER — FINASTERIDE 5 MG PO TABS
5.0000 mg | ORAL_TABLET | Freq: Every day | ORAL | Status: DC
  Administered 2023-06-06 – 2023-06-11 (×6): 5 mg via ORAL
  Filled 2023-06-06 (×6): qty 1

## 2023-06-06 MED ORDER — ENSURE ENLIVE PO LIQD
237.0000 mL | Freq: Three times a day (TID) | ORAL | Status: DC
  Administered 2023-06-06 – 2023-06-11 (×10): 237 mL via ORAL

## 2023-06-06 MED ORDER — BUPROPION HCL ER (XL) 150 MG PO TB24
150.0000 mg | ORAL_TABLET | Freq: Every morning | ORAL | Status: DC
  Administered 2023-06-06 – 2023-06-08 (×3): 150 mg via ORAL
  Filled 2023-06-06 (×3): qty 1

## 2023-06-06 MED ORDER — FAMOTIDINE 20 MG PO TABS
20.0000 mg | ORAL_TABLET | Freq: Every day | ORAL | Status: DC
  Administered 2023-06-06 – 2023-06-11 (×6): 20 mg via ORAL
  Filled 2023-06-06 (×6): qty 1

## 2023-06-06 MED ORDER — SENNOSIDES-DOCUSATE SODIUM 8.6-50 MG PO TABS
2.0000 | ORAL_TABLET | Freq: Two times a day (BID) | ORAL | Status: DC
  Administered 2023-06-06 – 2023-06-10 (×9): 2 via ORAL
  Filled 2023-06-06 (×10): qty 2

## 2023-06-06 MED ORDER — PANTOPRAZOLE SODIUM 40 MG PO TBEC
40.0000 mg | DELAYED_RELEASE_TABLET | Freq: Two times a day (BID) | ORAL | Status: DC
  Administered 2023-06-06 – 2023-06-08 (×5): 40 mg via ORAL
  Filled 2023-06-06 (×5): qty 1

## 2023-06-06 NOTE — Progress Notes (Signed)
Speech Language Pathology Treatment: Dysphagia  Patient Details Name: Randy Merritt MRN: [id number] DOB: [birth year] Today's Date: 06/06/2023 Time: 8469-6295 SLP Time Calculation (min) (ACUTE ONLY): 21 min  Assessment / Plan / Recommendation Clinical Impression  Pt observed with trials of thin liquids, purees, and solids with no overt s/s of dysphagia or aspiration. Pt reported a globus sensation after eating minimal POs and belched throughout session. SLP provided education handout regarding esophageal precautions and reinforced use of liquid wash, small bites/sips, and eating smaller meals more frequently throughout the day. Pt verbalized agreement with continuing diet of Dys 3 textures and thin liquids as this is most similar to his baseline. No further SLP f/u is needed at this time. Will s/o.    HPI HPI: Pt is an 82 yo male presenting with progressively worsening dysphagia x2 weeks. Per H&P note, pt reports having to spit up his secretions and 15 lb weight loss due to decreased PO intake. CT Soft Tissue Neck with unchanged large R and smaller L thyroid nodules with regional mass effect resulting in mild leftward displacement and narrowing of infraglottic airway. MBS 12/05/22 with "mild oral and moderate pharyngo-cervical esophageal dysphagia". At that time, recommended diet of Dys 3 with thin liquids using a liquid wash and intermittent dry swallows. Per [facility] consult note, pt may be a candidate for thyroidectomy or botox injection of cricopharyngeus. Per GI note, endoscopy showed narrowing UES and possible subtle narrowing LES with suspected findings from extrinsic compression (thyroid +/- cervical osteophytes). Pt had esophageal dilation to 16 mm ~1 month ago with no improvement. PMH includes chronic pain, memory loss, depression, thrombocytopenia, cervical radiculopathy, HTN, aphasia, globus pharyngeus, goiter, CKD, HLD, oropharyngeal dysphagia, GERD      SLP Plan  All goals met       Recommendations for follow up therapy are one component of a multi-disciplinary discharge planning process, led by the attending physician.  Recommendations may be updated based on patient status, additional functional criteria and insurance authorization.    Recommendations  Diet recommendations: Dysphagia 3 (mechanical soft);Thin liquid Liquids provided via: Cup;Straw Medication Administration: Whole meds with liquid Supervision: Patient able to self feed Compensations: Slow rate;Small sips/bites;Multiple dry swallows after each bite/sip;Follow solids with liquid Postural Changes and/or Swallow Maneuvers: Seated upright 90 degrees;Upright 30-60 min after meal                  Oral care BID   PRN Dysphagia, pharyngeal phase (R13.13)     All goals met     Gwynneth Aliment, M.A., CF-SLP Speech Language Pathology, Acute Rehabilitation Services  Secure Chat preferred [phone]  06/06/2023, 1:17 PM

## 2023-06-06 NOTE — Care Management Important Message (Signed)
Important Message  Patient Details  Name: Randy Merritt MRN: [id number] Date of Birth: 02-Aug-1941   Medicare Important Message Given:  Yes     Sherilyn Banker 06/06/2023, 1:49 PM

## 2023-06-06 NOTE — Plan of Care (Signed)

## 2023-06-06 NOTE — Progress Notes (Signed)
   06/06/23 1335  Mobility  Activity Ambulated independently in hallway  Level of Assistance Independent  Assistive Device None  Distance Ambulated (ft) 550 ft  Activity Response Tolerated well  Mobility Referral Yes  $Mobility charge 1 Mobility  Mobility Specialist Start Time (ACUTE ONLY) 1100  Mobility Specialist Stop Time (ACUTE ONLY) 1115  Mobility Specialist Time Calculation (min) (ACUTE ONLY) 15 min   Mobility Specialist: Progress Note  Pt received in bed, agreed to mobility session. Independent throughout entire session. After session, pt returned to bed with all needs met. Call bell within reach.   Barnie Mort Mobility Specialist Please contact via SecureChat or Rehab office at [phone]

## 2023-06-06 NOTE — Plan of Care (Signed)
  Problem: Education: Goal: Knowledge of General Education information will improve Description: Including pain rating scale, medication(s)/side effects and non-pharmacologic comfort measures Outcome: Progressing   Problem: Health Behavior/Discharge Planning: Goal: Ability to manage health-related needs will improve Outcome: Progressing   Problem: Clinical Measurements: Goal: Ability to maintain clinical measurements within normal limits will improve Outcome: Progressing Goal: Will remain free from infection Outcome: Progressing Goal: Diagnostic test results will improve Outcome: Progressing Goal: Cardiovascular complication will be avoided Outcome: Progressing   Problem: Activity: Goal: Risk for activity intolerance will decrease Outcome: Progressing   Problem: Coping: Goal: Level of anxiety will decrease Outcome: Progressing   Problem: Elimination: Goal: Will not experience complications related to bowel motility Outcome: Progressing Goal: Will not experience complications related to urinary retention Outcome: Progressing   Problem: Pain Managment: Goal: General experience of comfort will improve Outcome: Progressing   Problem: Safety: Goal: Ability to remain free from injury will improve Outcome: Progressing   Problem: Skin Integrity: Goal: Risk for impaired skin integrity will decrease Outcome: Progressing   

## 2023-06-06 NOTE — Progress Notes (Signed)
[facility] Progress Note Patient: Randy Merritt ZOX:[id number] DOB: 06/18/1941 DOA: 06/03/2023  DOS: the patient was seen and examined on 06/06/2023  Brief hospital course: 82 yo M with PMH of cervical radiculopathy s/p ACDF in 2014, nontoxic goiter, HTN, dysphagia, CKD-3A, memory loss, chronic pain, anxiety and depression presenting with progressive dysphagia.  Patient is not a great historian.  Reportedly had progressive dysphagia over 2 weeks with poor p.o. intake and about 15 pound weight loss.  Patient's wife reports some dysphagia since he had his neck surgery.  He has been evaluated by [facility] outpatient.  Seems like he had EGD in June 2024 that showed narrowing UES from possible extrinsic compression from thyroid +/- cervical osteophytes).  EGD also showed possible subtle narrowing of LES.  Patient is also followed by general surgery, Dr. Georgana Curio for enlarging nontoxic goiter.  Currently being ruled for dysphagia.  GI, general surgery, speech therapy consulted. Neurosurgery was also consulted for osteophytes and recommended no intervention necessary for now. GI currently signed off as suspect the dysphagia is more related to extrinsic compression. SLP evaluated patient, MBS completed on 7/24.  Patient is currently on dysphagia 3 diet. Family states that dysphagia could be due to to some new medications that the psychiatry started which might have led to some throat swelling.  Assessment and Plan: Dysphagia:  Most likely due to extrinsic compression. Extensive evaluation as above. Per family ongoing dysphagia since he has neck surgery but gotten worse in the last 2 weeks.   No airway compromise.   Per ENT, may be a candidate for Botox injection of cricopharyngeus, which can be performed by Laryngology in [place].   Per GI, EGD showed narrowing UES and possible subtle narrowing LES. Suspect UES findings are from extrinsic compression (thyroid +/- cervical osteophytes). Patient had  esophageal dilatation to 16mm about one month ago with NO improvement.  Per neurosurgery Dr. Maisie Fus, no success with osteophytectomy even if we think osteophytes are contributing to dysphagia although less likely. SLP took the patient for MBS.  Currently on dysphagia 3 diet. Suspect his dysphagia is more likely associated with psychological issues.  Per ENT as well as supportive GI as well as per general surgery patient does not appear to have any organic reason for severe dysphagia. Dysphagia is also reportedly intermittently rather than continuous. Dietitian consult.  Will also consult psychiatry.   Non-toxic multinodular goiter:  Goiter is confirmed to be causing extrinsic compression of airway on multiple CT scans now. TSH suppressed.  Free T4 normal. Currently does not have any evidence of hyperthyroidism. Will monitor   Essential hypertension: Normotensive Blood pressure stable.   CKD-3A: Stable Baseline serum creatinine around 1.4-1.3.  Currently seen. -Monitor   Anxiety and depression: Feels anxious. Has alprazolam listed as allergy but does not recall Resuming home Seroquel dose was to be on trazodone. Also resuming as needed hydroxyzine.   History of cervical radiculopathy s/p ACDF in 2014: Stable on CT   GERD Continue PPI as above.    Subjective: Reports nausea.  Reports anxiety.  No vomiting.  Reports constipation.  Physical Exam: Clear to auscultation Bowel sounds present. S1-S2 present. No edema  Data Reviewed: I have Reviewed nursing notes, Vitals, and Lab results. Reviewed CBC and BMP. General surgery consulted.  Disposition: Status is: Inpatient Remains inpatient appropriate because: Oral intake is adequate.  Place and maintain sequential compression device Start: 06/05/23 0742   Family Communication: Wife at bedside Level of care: Med-Surg   Vitals:   06/05/23  2126 06/06/23 0453 06/06/23 0839 06/06/23 1550  BP: (!) 126/59 (!) 140/73 125/77 (!)  151/66  Pulse: 63 64 84 70  Resp: 18 18 16 16   Temp: 98.7 F (37.1 C) 98.7 F (37.1 C) 97.6 F (36.4 C) 97.9 F (36.6 C)  TempSrc: Oral Oral Oral Oral  SpO2: 100% 100% 100% 100%  Weight:      Height:         Author: Lynden Oxford, MD 06/06/2023 6:32 PM  Please look on [email or web address] to find out who is on call.

## 2023-06-06 NOTE — Progress Notes (Signed)
Patient ID: Randy Merritt, male   DOB: 09-Jun-1941, 82 y.o.   MRN: [id number] Ntd urgently. Can follow up with Dr Gerrit Friends as outpatient.

## 2023-06-07 DIAGNOSIS — E440 Moderate protein-calorie malnutrition: Secondary | ICD-10-CM | POA: Insufficient documentation

## 2023-06-07 DIAGNOSIS — R1312 Dysphagia, oropharyngeal phase: Secondary | ICD-10-CM | POA: Diagnosis not present

## 2023-06-07 LAB — BASIC METABOLIC PANEL
Anion gap: 9 (ref 5–15)
BUN: 6 mg/dL — ABNORMAL LOW (ref 8–23)
CO2: 26 mmol/L (ref 22–32)
Calcium: 9.2 mg/dL (ref 8.9–10.3)
Chloride: 105 mmol/L (ref 98–111)
Creatinine, Ser: 1.16 mg/dL (ref 0.61–1.24)
GFR, Estimated: >60 mL/min (ref >60)
Glucose, Bld: 111 mg/dL — ABNORMAL HIGH (ref 70–99)
Potassium: 3.8 mmol/L (ref 3.5–5.1)
Sodium: 140 mmol/L (ref 135–145)

## 2023-06-07 MED ORDER — HEPARIN SODIUM (PORCINE) 5000 UNIT/ML IJ SOLN
5000.0000 [IU] | Freq: Three times a day (TID) | INTRAMUSCULAR | Status: DC
  Administered 2023-06-07 – 2023-06-11 (×11): 5000 [IU] via SUBCUTANEOUS
  Filled 2023-06-07 (×11): qty 1

## 2023-06-07 NOTE — Progress Notes (Signed)
Initial Nutrition Assessment  DOCUMENTATION CODES:   Non-severe (moderate) malnutrition in context of chronic illness  INTERVENTION:   - 48-hour calorie count, RD to follow up with results on Monday, 06/10/23  - Continue Ensure Enlive po TID between meals, each supplement provides 350 kcal and 20 grams of protein  - Trial Magic Cup TID with meals, each supplement provides 290 kcal and 9 grams of protein  - Trial Mighty Shake TID with meals, each supplement provides 330 kcals and 9 grams of protein  - Encourage PO intake  NUTRITION DIAGNOSIS:   Moderate Malnutrition related to chronic illness (dysphagia) as evidenced by moderate fat depletion, moderate muscle depletion.  GOAL:   Patient will meet greater than or equal to 90% of their needs  MONITOR:   PO intake, Supplement acceptance, Labs, Weight trends  REASON FOR ASSESSMENT:   Consult Assessment of nutrition requirement/status  ASSESSMENT:   82 year old male who presented to the ED on 7/22 with dysphagia, chest pain. PMH of non-toxic multinodular goiter, anxiety, depression, HTN, CKD stage IIIa, dysphagia, C5-6 ACDF.  07/24 - s/p MBS with diet advanced to dysphagia 3 and thin liquids  Pt admitted with dysphasia. Per GI note, EGD performed in June 2024 showed narrowing UES and possible subtle narrowing LES. GI suspects UES findings are from extrinsic compression (thyroid +/- cervical osteophytes). Pt had an esophageal dilation about 1 month ago with no improvement.  Spoke with pt and wife at bedside. RN in room giving pt morning medications. Pt having difficulty with medications.  Pt reports inability to eat due to dysphagia. He reports sensation of food feeling stuck in his throat. Pt had to take a break halfway through taking his medications in order for the sensation to subside.  Majority of history obtained from pt's wife. She reports that pt has been having issues with goiter for quite some time now but that  doctors did not take it seriously until recently. She shares that pt's PO intake has decreased drastically over the last 3 weeks. Pt is only eating applesauce, mashed potatoes, sweet potatoes, peas, and other very soft foods. Pt has been drinking water and 1-2 Ensure supplements daily. Pt has been unable to foods like chicken and broccoli. Overall, pt has been tolerating liquids better than solids.  Pt's wife also shares concern regarding pt having to "spit up mucus" constantly. She states that this occurs randomly and pt will have to spit out thick, white, frothy mucus. She believes that this issue is contributing to dysphagia and poor PO intake.  Pt's wife reports that pt has lost about 15 lbs over the last 1 month. She states that he used to weigh 153 lbs then lost weight down to 143 lbs. Pt continued to lose weight further down to 141 lbs. Weight on admission of 145 lbs appears to be stated rather than measured. If accurate, pt has experienced a 2.2 kg weight loss in 17 days. This is a 3.2% weight loss which is significant for timeframe. Based on NFPE and weight loss, pt meets criteria for moderate malnutrition.  Pt consumed ~75% of an Ensure supplement with RD in room. Pt willing to continue drinking Ensure supplements during admission. Explained plan for calorie count to objectively assess PO intake. Pt's wife expresses understanding. A calorie count envelope was hung on pt's door.  Meal Completion: 50% x 1 documented meal (lunch yesterday)  Medications reviewed and include: pepcid, Ensure Enlive TID, protonix, senna  Labs reviewed: potassium 3.4 on 7/25, platelets 124  UOP: 1500 ml x 24 hours  NUTRITION - FOCUSED PHYSICAL EXAM:  Flowsheet Row Most Recent Value  Orbital Region Moderate depletion  Upper Arm Region Moderate depletion  Thoracic and Lumbar Region Moderate depletion  Buccal Region Moderate depletion  Temple Region Moderate depletion  Clavicle Bone Region Moderate depletion   Clavicle and Acromion Bone Region Severe depletion  Scapular Bone Region Moderate depletion  Dorsal Hand Moderate depletion  Patellar Region Moderate depletion  Anterior Thigh Region Moderate depletion  Posterior Calf Region Mild depletion  Edema (RD Assessment) None  Hair Reviewed  Eyes Reviewed  Mouth Reviewed  Skin Reviewed  Nails Reviewed    Diet Order:   Diet Order             DIET DYS 3 Room service appropriate? Yes with Assist; Fluid consistency: Thin  Diet effective now                   EDUCATION NEEDS:   Education needs have been addressed  Skin:  Skin Assessment: Reviewed RN Assessment  Last BM:  06/06/23  Height:   Ht Readings from Last 1 Encounters:  06/03/23 5\' 8"  (1.727 m)    Weight:   Wt Readings from Last 1 Encounters:  06/03/23 65.8 kg    Ideal Body Weight:  70 kg  BMI:  Body mass index is 22.05 kg/m.  Estimated Nutritional Needs:   Kcal:  1700-1900  Protein:  80-95 grams  Fluid:  1.7-1.9 L    Mertie Clause, MS, RD, LDN Inpatient Clinical Dietitian Please see AMiON for contact information.

## 2023-06-07 NOTE — Progress Notes (Signed)
[facility] Progress Note Patient: Randy Merritt WJX:[id number] DOB: 10-Nov-1941 DOA: 06/03/2023  DOS: the patient was seen and examined on 06/07/2023  Brief hospital course: 82 yo M with PMH of cervical radiculopathy s/p ACDF in 2014, nontoxic goiter, HTN, dysphagia, CKD-3A, memory loss, chronic pain, anxiety and depression presenting with progressive dysphagia.  Patient is not a great historian.  Reportedly had progressive dysphagia over 2 weeks with poor p.o. intake and about 15 pound weight loss.  Patient's wife reports some dysphagia since he had his neck surgery.  He has been evaluated by [facility] GI outpatient.  Seems like he had EGD in June 2024 that showed narrowing UES from possible extrinsic compression from thyroid +/- cervical osteophytes).  EGD also showed possible subtle narrowing of LES.  Patient is also followed by general surgery, Dr. Georgana Curio for enlarging nontoxic goiter.  Currently being ruled for dysphagia.  GI, general surgery, speech therapy consulted. Neurosurgery was also consulted for osteophytes and recommended no intervention necessary for now. GI currently signed off as suspect the dysphagia is more related to extrinsic compression. SLP evaluated patient, MBS completed on 7/24.  Patient is currently on dysphagia 3 diet. Family states that dysphagia could be due to to some new medications that the psychiatry started which might have led to some throat swelling.  Assessment and Plan: Dysphagia:  Most likely due to extrinsic compression. Extensive evaluation as above. Per family ongoing dysphagia since he has neck surgery but gotten worse in the last 2 weeks.   No airway compromise.   Per ENT, may be a candidate for Botox injection of cricopharyngeus, which can be performed by Laryngology in [place].   Per GI, EGD showed narrowing UES and possible subtle narrowing LES. Suspect UES findings are from extrinsic compression (thyroid +/- cervical osteophytes). Patient had  esophageal dilatation to 16mm about one month ago with NO improvement.  Per neurosurgery Dr. Maisie Fus, no success with osteophytectomy even if we think osteophytes are contributing to dysphagia although less likely. SLP took the patient for MBS.  Currently on dysphagia 3 diet. Suspect his dysphagia is more likely associated with psychological issues.  Per ENT as well as supportive GI as well as per general surgery patient does not appear to have any organic reason for severe dysphagia. Dysphagia is also reportedly intermittently rather than continuous. Dietitian consult.  Will consult psychiatry if patient fails calorie count.   Non-toxic multinodular goiter:  Goiter is confirmed to be causing extrinsic compression of airway on multiple CT scans now. TSH suppressed.  Free T4 normal. Currently does not have any evidence of hyperthyroidism. Will monitor   Essential hypertension: Normotensive Blood pressure stable.   CKD-3A: Stable Baseline serum creatinine around 1.4-1.3.  Currently improving. -Monitor   Anxiety and depression:  Insomnia Has alprazolam listed as allergy but does not recall Resuming home Seroquel Per chart review was to be on trazodone instead of Seroquel. Also if patient will be on Seroquel, might benefit from being on Zyprexa to help with appetite stimulation. Also resuming as needed hydroxyzine.   History of cervical radiculopathy s/p ACDF in 2014: Stable on CT   GERD Continue PPI as above.    Subjective: No nausea or vomiting.  No fever no chills.  In good spirits.  Physical Exam: General: in Mild distress, No Rash Cardiovascular: S1 and S2 Present, No Murmur Respiratory: Good respiratory effort, Bilateral Air entry present. No Crackles, No wheezes Abdomen: Bowel Sound present, No tenderness Extremities: No edema Neuro: Alert and oriented x3, no  new focal deficit  Data Reviewed: I have Reviewed nursing notes, Vitals, and Lab results. Since last encounter,  pertinent lab results CBC and BMP   . I have ordered test including CBC BMP magnesium phosphorus  .   Disposition: Status is: Inpatient Remains inpatient appropriate because: Awaiting calorie count.  Place and maintain sequential compression device Start: 06/05/23 0742   Family Communication: No one at bedside Level of care: Med-Surg   Vitals:   06/06/23 1944 06/07/23 0437 06/07/23 0733 06/07/23 1725  BP: (!) 143/76 (!) 150/82 (!) 142/90 (!) 140/75  Pulse: 72 67 62 71  Resp: 17 18 17 17   Temp: 98.2 F (36.8 C) 98.4 F (36.9 C) 98.6 F (37 C) 97.6 F (36.4 C)  TempSrc: Oral Oral Oral Oral  SpO2: 100% 99% 100% 100%  Weight:      Height:         Author: Lynden Oxford, MD 06/07/2023 6:56 PM  Please look on [email or web address] to find out who is on call.

## 2023-06-07 NOTE — TOC CM/SW Note (Signed)
Transition of Care [facility]) - Inpatient Brief Assessment   Patient Details  Name: Randy Merritt MRN: [id number] Date of Birth: 30-Jun-1941  Transition of [facility]) CM/SW Contact:    Mearl Latin, LCSW Phone Number: 06/07/2023, 9:34 AM   Clinical Narrative: Patient admitted from home with spouse. No current TOC care needs identified at this time but please consult if needs arise.    Transition of Care Asessment: Insurance and Status: Insurance coverage has been reviewed Patient has primary care physician: Yes Home environment has been reviewed: From home Prior level of function:: Independent Prior/Current Home Services: No current home services Social Determinants of Health Reivew: SDOH reviewed no interventions necessary Readmission risk has been reviewed: Yes Transition of care needs: no transition of care needs at this time

## 2023-06-07 NOTE — Plan of Care (Signed)

## 2023-06-08 DIAGNOSIS — R1312 Dysphagia, oropharyngeal phase: Secondary | ICD-10-CM | POA: Diagnosis not present

## 2023-06-08 MED ORDER — ACETAMINOPHEN 325 MG PO TABS: 650.0000 mg | ORAL_TABLET | Freq: Four times a day (QID) | ORAL | Status: DC | PRN

## 2023-06-08 MED ORDER — PANTOPRAZOLE SODIUM 40 MG PO TBEC
40.0000 mg | DELAYED_RELEASE_TABLET | Freq: Two times a day (BID) | ORAL | Status: DC
  Administered 2023-06-08 – 2023-06-11 (×6): 40 mg via ORAL
  Filled 2023-06-08 (×6): qty 1

## 2023-06-08 MED ORDER — HYDROXYZINE HCL 25 MG PO TABS
25.0000 mg | ORAL_TABLET | Freq: Three times a day (TID) | ORAL | Status: DC
  Administered 2023-06-08 – 2023-06-11 (×9): 25 mg via ORAL
  Filled 2023-06-08 (×9): qty 1

## 2023-06-08 NOTE — Progress Notes (Signed)
[facility] Progress Note Patient: Randy Merritt ZOX:[id number] DOB: Oct 25, 1941 DOA: 06/03/2023  DOS: the patient was seen and examined on 06/08/2023  Brief hospital course: 82 yo M with PMH of cervical radiculopathy s/p ACDF in 2014, nontoxic goiter, HTN, dysphagia, CKD-3A, memory loss, chronic pain, anxiety and depression presenting with progressive dysphagia.  Patient is not a great historian.  Reportedly had progressive dysphagia over 2 weeks with poor p.o. intake and about 15 pound weight loss.  Patient's wife reports some dysphagia since he had his neck surgery.  He has been evaluated by [facility] outpatient.  Seems like he had EGD in June 2024 that showed narrowing UES from possible extrinsic compression from thyroid +/- cervical osteophytes).  EGD also showed possible subtle narrowing of LES.  Patient is also followed by general surgery, Dr. Georgana Curio for enlarging nontoxic goiter.  Currently being ruled for dysphagia.  GI, general surgery, speech therapy consulted. Neurosurgery was also consulted for osteophytes and recommended no intervention necessary for now. GI currently signed off as suspect the dysphagia is more related to extrinsic compression. SLP evaluated patient, MBS completed on 7/24.  Patient is currently on dysphagia 3 diet. Family states that dysphagia could be due to mirtazapine that is psychiatry started for insomnia which might have led to some throat swelling. Currently undergoing calorie count.  Assessment and Plan: Dysphagia Initially there was concern for extrinsic compression. Extensive evaluation as above. Per family ongoing dysphagia since he has neck surgery but gotten worse in the last 2 weeks.   No airway compromise.   Per ENT, may be a candidate for Botox injection of cricopharyngeus, which can be performed by Laryngology in [place].   Per GI, EGD showed narrowing UES and possible subtle narrowing LES. Suspect UES findings are from extrinsic compression  (thyroid +/- cervical osteophytes). Patient had esophageal dilatation to 16mm about one month ago with NO improvement.  Per neurosurgery Dr. Maisie Fus, no success with osteophytectomy even if we think osteophytes are contributing to dysphagia although less likely. SLP took the patient for MBS.  Currently on dysphagia 3 diet. Per Dr. Dwain Sarna and Dr. Gerrit Friends thyroid is less likely contributing to patient's dysphagia.  No urgent surgical intervention recommended. Now concern is that his dysphagia is more likely associated with psychological issues. Dysphagia is also reportedly intermittently rather than continuous. Dietitian consulted for calorie count. Will consult psychiatry if patient fails calorie count. Patient is able to maintain adequate hydration and nutrition based on stable electrolytes and serum creatinine for last 72 hours.   Non-toxic multinodular goiter:  Goiter is confirmed to be causing extrinsic compression of airway on multiple CT scans now. TSH suppressed.  Free T4 normal. Currently does not have any evidence of hyperthyroidism. Will monitor   Essential hypertension: Normotensive Blood pressure stable.   CKD-3A: Stable Baseline serum creatinine around 1.4-1.3. Currently improving. -Monitor   Anxiety and depression:  Insomnia Patient sees PCP and psychiatry for managing his medications. Patient was on mirtazapine for insomnia but per wife they had some concerns for side effect and therefore they stopped using that medication. Patient was on trazodone before that and has not used that medication in last 2 weeks. Patient was prescribed Seroquel by PCP's colleague and per my discussion with wife and patient on 7/27 Seroquel has helped more than trazodone to help with the sleep. Has alprazolam listed as allergy but does not recall.  Per wife bupropion has been discontinued due to reported side effect of dizziness.  Medication was active on PTA  list and therefore was continued  in the hospital for 3 days, now discontinued. Patient has informed me that hydroxyzine helped his stomach symptoms on 7/27. Patient is aware currently continue Seroquel and hydroxyzine. As mentioned above if he fails calorie count will consult psychiatry.   History of cervical radiculopathy s/p ACDF in 2014: Stable on CT   GERD Continue PPI as above.    Subjective: No nausea no vomiting no fever no chills.  Physical Exam: S1-S2 present. Clear to auscultation. No throat edema or erythema seen. No new swelling seen. No stridor. No edema in the lower extremity. Neuro: Alert and oriented x3, no new focal deficit  Data Reviewed: I have Reviewed nursing notes, Vitals, and Lab results. Since last encounter, pertinent lab results CBC and BMP   . I have ordered test including CBC and BMP  .   Disposition: Status is: Inpatient Remains inpatient appropriate because: Awaiting calorie count results  heparin injection 5,000 Units Start: 06/07/23 2200 Place and maintain sequential compression device Start: 06/05/23 0742   Family Communication: Discussed with wife on the phone. Level of care: Med-Surg   Vitals:   06/07/23 2027 06/08/23 0444 06/08/23 0812 06/08/23 1622  BP: 133/75 (!) 140/79 106/72 116/62  Pulse: 76 71 76 66  Resp: 16 16 17 17   Temp: 98.2 F (36.8 C) 98.2 F (36.8 C) 98 F (36.7 C) 98 F (36.7 C)  TempSrc: Oral Oral Oral   SpO2: 100% 100% 99% 100%  Weight:      Height:         Author: Lynden Oxford, MD 06/08/2023 4:32 PM  Please look on [email or web address] to find out who is on call.

## 2023-06-09 DIAGNOSIS — R1312 Dysphagia, oropharyngeal phase: Secondary | ICD-10-CM | POA: Diagnosis not present

## 2023-06-09 LAB — BASIC METABOLIC PANEL WITH GFR
Anion gap: 7 (ref 5–15)
BUN: 15 mg/dL (ref 8–23)
CO2: 28 mmol/L (ref 22–32)
Calcium: 8.9 mg/dL (ref 8.9–10.3)
Chloride: 102 mmol/L (ref 98–111)
Creatinine, Ser: 1.1 mg/dL (ref 0.61–1.24)
GFR, Estimated: >60 mL/min (ref >60)
Glucose, Bld: 113 mg/dL — ABNORMAL HIGH (ref 70–99)
Potassium: 4 mmol/L (ref 3.5–5.1)
Sodium: 137 mmol/L (ref 135–145)

## 2023-06-09 LAB — MAGNESIUM: Magnesium: 2.2 mg/dL (ref 1.7–2.4)

## 2023-06-09 LAB — CBC
HCT: 32.3 % — ABNORMAL LOW (ref 39.0–52.0)
Hemoglobin: 10.8 g/dL — ABNORMAL LOW (ref 13.0–17.0)
MCH: 31.1 pg (ref 26.0–34.0)
MCHC: 33.4 g/dL (ref 30.0–36.0)
MCV: 93.1 fL (ref 80.0–100.0)
Platelets: 114 10*3/uL — ABNORMAL LOW (ref 150–400)
RBC: 3.47 MIL/uL — ABNORMAL LOW (ref 4.22–5.81)
RDW: 12.9 % (ref 11.5–15.5)
WBC: 3.2 10*3/uL — ABNORMAL LOW (ref 4.0–10.5)
nRBC: 0 % (ref 0.0–0.2)

## 2023-06-09 LAB — PHOSPHORUS: Phosphorus: 3 mg/dL (ref 2.5–4.6)

## 2023-06-09 MED ORDER — MAGIC MOUTHWASH
10.0000 mL | Freq: Four times a day (QID) | ORAL | Status: DC
  Administered 2023-06-09 – 2023-06-11 (×5): 10 mL via ORAL
  Filled 2023-06-09 (×11): qty 10

## 2023-06-09 NOTE — Progress Notes (Signed)
Patient refused his ensure, stated that he's full.

## 2023-06-09 NOTE — Progress Notes (Signed)
[facility] Progress Note Patient: Randy Merritt TDV:[id number] DOB: [birth year] DOA: 06/03/2023  DOS: the patient was seen and examined on 06/09/2023  Brief hospital course: 82 yo M with PMH of cervical radiculopathy s/p ACDF in 2014, nontoxic goiter, HTN, dysphagia, CKD-3A, memory loss, chronic pain, anxiety and depression presenting with progressive dysphagia.  Patient is not a great historian.  Reportedly had progressive dysphagia over 2 weeks with poor p.o. intake and about 15 pound weight loss.  Patient's wife reports some dysphagia since he had his neck surgery.  He has been evaluated by [facility] outpatient.  Seems like he had EGD in June 2024 that showed narrowing UES from possible extrinsic compression from thyroid +/- cervical osteophytes).  EGD also showed possible subtle narrowing of LES.  Patient is also followed by general surgery, Dr. Georgana Curio for enlarging nontoxic goiter.  Currently being ruled for dysphagia.  GI, general surgery, speech therapy consulted. Neurosurgery was also consulted for osteophytes and recommended no intervention necessary for now. GI currently signed off as suspect the dysphagia is more related to extrinsic compression. SLP evaluated patient, MBS completed on 7/24.  Patient is currently on dysphagia 3 diet. Family states that dysphagia could be due to mirtazapine that is psychiatry started for insomnia which might have led to some throat swelling. Currently undergoing calorie count.  Assessment and Plan: Dysphagia Initially there was concern for extrinsic compression. Extensive evaluation as above. Per family ongoing dysphagia since he has neck surgery but gotten worse in the last 2 weeks.   No airway compromise.   Per ENT, may be a candidate for Botox injection of cricopharyngeus, which can be performed by Laryngology in [place].   Per GI, EGD showed narrowing UES and possible subtle narrowing LES. Suspect UES findings are from extrinsic compression  (thyroid +/- cervical osteophytes). Patient had esophageal dilatation to 16mm about one month ago with NO improvement.  Per neurosurgery Dr. Maisie Fus, no success with osteophytectomy even if we think osteophytes are contributing to dysphagia although less likely. SLP took the patient for MBS.  Currently on dysphagia 3 diet. Per Dr. Dwain Sarna and Dr. Gerrit Friends thyroid is less likely contributing to patient's dysphagia.  No urgent surgical intervention recommended. Now concern is that his dysphagia is more likely associated with psychological issues. Dysphagia is also reportedly intermittently rather than continuous. Dietitian consulted for calorie count. Will consult psychiatry if patient fails calorie count. Patient is able to maintain adequate hydration and nutrition based on stable electrolytes and serum creatinine for last 72 hours. Adding Magic mouthwash.   Non-toxic multinodular goiter:  Goiter is confirmed to be causing extrinsic compression of airway on multiple CT scans now. TSH suppressed.  Free T4 normal. Currently does not have any evidence of hyperthyroidism. Will monitor   Essential hypertension: Normotensive Blood pressure stable.   CKD-3A: Stable Baseline serum creatinine around 1.4-1.3. Currently improving. -Monitor   Anxiety and depression:  Insomnia Patient sees PCP and psychiatry for managing his medications. Patient was on mirtazapine for insomnia but per wife they had some concerns for side effect and therefore they stopped using that medication. Patient was on trazodone before that and has not used that medication in last 2 weeks. Patient was prescribed Seroquel by PCP's colleague and per my discussion with wife and patient on 7/27 Seroquel has helped more than trazodone to help with the sleep. Has alprazolam listed as allergy but does not recall.  Per wife bupropion has been discontinued due to reported side effect of dizziness.  Medication was  active on PTA list and  therefore was continued in the hospital for 3 days, now discontinued. Patient has informed me that hydroxyzine helped his stomach symptoms on 7/27. Patient is aware currently continue Seroquel and hydroxyzine. As mentioned above if he fails calorie count will consult psychiatry.   History of cervical radiculopathy s/p ACDF in 2014: Stable on CT   GERD Continue PPI as above.    Subjective: No nausea or vomiting.  Reports some throat pain.  Had a BM yesterday.  No fever no chills.  Physical Exam: General: in Mild distress, No Rash Cardiovascular: S1 and S2 Present, No Murmur Respiratory: Good respiratory effort, Bilateral Air entry present. No Crackles, No wheezes Abdomen: Bowel Sound present, No tenderness Extremities: No edema Neuro: Alert and oriented x3, no new focal deficit  Data Reviewed: I have Reviewed nursing notes, Vitals, and Lab results. Since last encounter, pertinent lab results CBC and BMP and magnesium and phosphorus   . I have ordered test including CBC and BMP magnesium and phosphorus  .   Disposition: Status is: Inpatient Remains inpatient appropriate because: Awaiting calorie count  heparin injection 5,000 Units Start: 06/07/23 2200 Place and maintain sequential compression device Start: 06/05/23 0742   Family Communication: No one at bedside discussed with wife on 7/27 Level of care: Med-Surg   Vitals:   06/08/23 2036 06/09/23 0457 06/09/23 0844 06/09/23 1609  BP: 116/68 139/80 132/74 117/76  Pulse: 68 71 73 78  Resp:   17 18  Temp: 98.6 F (37 C) 98.7 F (37.1 C) 97.9 F (36.6 C) 98 F (36.7 C)  TempSrc: Oral  Oral Oral  SpO2: 99% 100% 100% 100%  Weight:      Height:         Author: Lynden Oxford, MD 06/09/2023 5:58 PM  Please look on [email or web address] to find out who is on call.

## 2023-06-09 NOTE — Plan of Care (Signed)
  Problem: Activity: Goal: Risk for activity intolerance will decrease Outcome: Progressing   Problem: Elimination: Goal: Will not experience complications related to urinary retention Outcome: Progressing   Problem: Pain Managment: Goal: General experience of comfort will improve Outcome: Progressing   Problem: Safety: Goal: Ability to remain free from injury will improve Outcome: Progressing   Problem: Skin Integrity: Goal: Risk for impaired skin integrity will decrease Outcome: Progressing   

## 2023-06-10 DIAGNOSIS — F418 Other specified anxiety disorders: Secondary | ICD-10-CM | POA: Diagnosis not present

## 2023-06-10 DIAGNOSIS — R1312 Dysphagia, oropharyngeal phase: Secondary | ICD-10-CM | POA: Diagnosis not present

## 2023-06-10 MED ORDER — DRONABINOL 2.5 MG PO CAPS
5.0000 mg | ORAL_CAPSULE | Freq: Two times a day (BID) | ORAL | Status: DC
  Administered 2023-06-10: 5 mg via ORAL
  Filled 2023-06-10: qty 2

## 2023-06-10 MED ORDER — ORAL CARE MOUTH RINSE: 15.0000 mL | OROMUCOSAL | Status: DC | PRN

## 2023-06-10 NOTE — Consult Note (Signed)
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 male admitted medically for 06/03/2023 10:59 AM for FTT and weight loss. He carries the psychiatric diagnoses of depression and anxiety and has a past medical history of  GERD, arthritis, goiter, high cholesterol, and perforated bowel. Psychiatry was consulted for  "pt with chronic dysphagia and weight loss, so far no compelling organinc cause identified, unable to meet his needs per calorie count. sees psych outpt for anxiety and insomnia. requesting consult to help rule out somatization". by Dr. Allena Katz.  His current presentation of low mood while hospitalized is most consistent with adjustment d/o/demoralization. He is able to verbalize how much his loss of independence in the hospital is wearing on him.  He has numerous physical reasons to have difficulty swallowing (goiter, osteophytes, oropharyngeal residue, etc) and as somatization is typically a diagnosis of exclusion will hold off on this. He was a very challenging pt to interview, often answering close-ended questions totally off-topic. This did not seem to be related to difficulty hearing, etc. He also exhibited minor word-finding difficulties througout interview (see examples below). I suspect some of his direfully meeting calore count is 2/2 dementia (forgetting to eat); when wife showed up it became clear she spends a signfiicant amt of time and energy reminding and encouraging him to eat. There was no evidence pointing towards delirium or waxing and waning nature of these sx when I spoke to his wife. He was unable to really verbalize any sx c/w significant depression or anxiety at time of my interview. In discussion with his wife, he has had adverse reactions to multiple psychotropic medications recently, and she feels  his lack of appetite is his current primary concern. For this reason, I would recommend deferring starting an antidepressant (many of which carry s/e of nausea, stomach upset, and lack of appetite), continuing current psychotropic medication regimen, and starting an appetite stimulant through the primary team.    Diagnoses:  Active Hospital problems: Principal Problem:   Dysphagia Active Problems:   Cervical radiculopathy   CKD (chronic kidney disease) stage 3, GFR 30-59 ml/min (HCC)   Essential (primary) hypertension   History of cervical spinal arthrodesis   Memory loss   Non-toxic multinodular goiter   Anxiety and depression   Malnutrition of moderate degree     Plan   ## Psychiatric Medication Recommendations:  -- none currently -- OK to continue current serouqel (prescribed for sleep, likely slight inc appetite) and buspirone (prescribed for anxiety, promotes gastric emptying)   -- if appetite stimulant ineffective, would consider quetiapine ---> olanzapine and then consider either duloxetine or an SSRI. Would not restart buproprion (often decreases appetite).   ## Medical Decision Making Capacity:  Not formally assessed  ## Further Work-up:  -- none currently     -- most recent EKG on 7/23 had QtC of 405 -- Pertinent labwork reviewed earlier this admission includes: TSH wnl, elevated Cr (improving over past few days)  ## Disposition:  -- There are no current psychiatric contraindications to discharge at this time -- f/u with psychiatry outpt  ## Behavioral / Environmental:  --  Utilize compassion and acknowledge the patient's experiences while setting clear and realistic expectations for care.    ## Safety and Observation Level:  - Based on my clinical evaluation, I estimate the patient  to be at low risk of self harm in the current setting - At this time, we recommend a routine level of observation. This decision is based on my review of the chart including  patient's history and current presentation, interview of the patient, mental status examination, and consideration of suicide risk including evaluating suicidal ideation, plan, intent, suicidal or self-harm behaviors, risk factors, and protective factors. This judgment is based on our ability to directly address suicide risk, implement suicide prevention strategies and develop a safety plan while the patient is in the clinical setting. Please contact our team if there is a concern that risk level has changed.   Thank you for this consult request. Recommendations have been communicated to the primary team.  We will sign off at this time.   Ludean Duhart A Taejon Irani  Psychiatric and Social History   Relevant Aspects of Hospital Course:  Admitted on 06/03/2023 for FTT/weight loss. They have ocntinued to struggle to eat and failed calorie count this AM.   Patient Report:  I saw patient this afternoon at first independently and later with wife at bedside he is oriented to self and situation.  He has difficulty telling me the month (tells me it is the seventh month but cannot find the word for July) and was unable to supply the year.  He has significant difficulty answering both open and close ended questions, often answering with an anecdote or story that is semirelated to the question asked.  For example, when asked if he had ever seen a psychiatrist before he described an appointment where it sounds like he was prescribed mirtazapine and later had the side effect of hallucinations (verified by his wife) rather than answering "yes ".  As another example, I asked directly if he had more trouble swallowing or more trouble with his appetite, told a story about his sister who was apparently prescribed an appetite stimulant with good effect.  He did not appear to have any difficulty hearing.  He was later able to recall a medication he took for anxiety years ago called "Libritum" (Librium) which he took before  preaching to help with his nerves  Most history below from patient's wife.  She is currently most concerned about his appetite, as a difficulty with swallowing appears to be sporadic.  He had been on psychiatric medications 10+ years ago also for anxiety and depression but had been off for several years before recently being started on mirtazapine for sleep, appetite and mood.  We briefly discussed the risks and benefits of starting an antidepressant (knowing that he is on both buspirone, Seroquel and hydroxyzine) and decided to hold off to see if an appetite stimulant would be more beneficial.  She describes worsening memory loss over the last year and a general slowing down (for example, although still quite strong his faith rarely goes to church).   Psych ROS:  Depression: When asked about mood, stated "I'm used to staying at home". Low appetite (?physiological)  Anxiety:   Mania (lifetime and current): Wife denied Psychosis: (lifetime and current): Both pt and wife describe hyponogogic hallucinations after taking mirtazapine, but no other lifetime psychotic symptoms.   Collateral information:  Wife's collateral in rest of note  Psychiatric History:  Information collected from wife  Prev Dx/Sx: depression, anxiety Current Psych Provider: Franne Grip NP Home Meds (current):  Recently on klonopin 0.5 every day  (PCP)  Mirtazapine 15 at bedtime (NP) Sertraline 50 every day (PCP) Quetiapine 25 at bedtime (PCP/PA) Buproprion 150  every day )PCP) Trazodone 100 at bedtime (PCP)   Previous Med Trials: remote librium, liekly others Therapy: no  Prior Psych Hospitalization: no  Prior Self Harm: no Prior Violence: no  Family Psych History: 1 son with schizophrenia, 1 son with depression/anxiety Family Hx suicide: not to wife's knowledge  Social History:   Educational Hx: minimal (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 Took some continuing ed at [employer] Occupational Hx: custodian at  [employer] Living Situation: with wife Spiritual Hx: yes, christian Access to weapons: no  Substance History Tobacco use: no Alcohol use: no Drug use: no    Exam Findings   Psychiatric Specialty Exam:  Presentation  General Appearance: Appropriate for Environment; Casual (thin)  Eye Contact:Fair  Speech:Clear and Coherent  Speech Volume:Normal  Handedness:No data recorded  Mood and Affect  Mood:-- ("I want to go home")  Affect:Appropriate; Congruent; Full Range   Thought Process  Thought Processes:Irrevelant  Descriptions of Associations:Intact  Orientation:-- (Self and situation)  Thought Content:Rumination (no evidence of delusions, paranoia)  Hallucinations:Hallucinations: None  Ideas of Reference:None  Suicidal Thoughts:Suicidal Thoughts: No  Homicidal Thoughts:Homicidal Thoughts: No   Sensorium  Memory:Recent Poor; Remote Fair; Immediate Poor  Judgment:Fair  Insight:Fair   Executive Functions  Concentration:Fair  Attention Span:Fair  Recall:Poor  Fund of Knowledge:Poor  Language:Good   Psychomotor Activity  Psychomotor Activity:Psychomotor Activity: Normal   Assets  Assets:Desire for Improvement; Social Support   Sleep  Sleep:Sleep: Fair    Physical Exam: Vital signs:  Temp:  [97.4 F (36.3 C)-98.3 F (36.8 C)] 97.4 F (36.3 C) (07/29 0724) Pulse Rate:  [72-88] 88 (07/29 0724) Resp:  [16-18] 16 (07/29 0724) BP: (117-136)/(74-83) 120/79 (07/29 0724) SpO2:  [100 %] 100 % (07/29 0724) Physical Exam Constitutional:      Comments: Thin   HENT:     Head: Normocephalic.  Eyes:     Extraocular Movements: Extraocular movements intact.  Pulmonary:     Effort: Pulmonary effort is normal.     Blood pressure 120/79, pulse 88, temperature (!) 97.4 F (36.3 C), temperature source Oral, resp. rate 16, height 5\' 8"  (1.727 m), weight 65.8 kg, SpO2 100%. Body mass index is 22.05 kg/m.   Other History   These have been pulled  in through the EMR, reviewed, and updated if appropriate.   Family History:  The patient's family history includes Diabetes in his sister; Healthy in his father and mother; Hypertension in his brother, sister, and sister.  Medical History: Past Medical History:  Diagnosis Date   Acid reflux    Anxiety    Arthritis    Depression    " a little"   Goiter    High cholesterol    History of blood transfusion    Perforated bowel (HCC)    Seasonal allergies     Surgical History: Past Surgical History:  Procedure Laterality Date   ANTERIOR CERVICAL DECOMP/DISCECTOMY FUSION N/A 04/08/2013   Procedure: ANTERIOR CERVICAL DECOMPRESSION/DISCECTOMY FUSION 1 LEVEL;  Surgeon: Karn Cassis, MD;  Location: [facility];  Service: Neurosurgery;  Laterality: N/A;  Cervical five-six Anterior cervical decompression/diskectomy fusion   APPENDECTOMY N/A 10/06/2019   Procedure: APPENDECTOMY;  Surgeon: Berna Bue, MD;  Location: [facility] OR;  Service: General;  Laterality: N/A;   BIOPSY  01/30/2019   Procedure: BIOPSY;  Surgeon: Jeani Hawking, MD;  Location: [facility];  Service: Endoscopy;;   CERVICAL DISCECTOMY     x2   CHOLECYSTECTOMY     COLON RESECTION SIGMOID N/A 10/06/2019  Procedure: COLON RESECTION SIGMOID;  Surgeon: Berna Bue, MD;  Location: [facility] OR;  Service: General;  Laterality: N/A;   ESOPHAGEAL DILATION  01/30/2019   Procedure: ESOPHAGEAL DILATION;  Surgeon: Jeani Hawking, MD;  Location: [facility];  Service: Endoscopy;;  [place]   ESOPHAGOGASTRODUODENOSCOPY (EGD) WITH PROPOFOL N/A 01/30/2019   Procedure: ESOPHAGOGASTRODUODENOSCOPY (EGD) WITH PROPOFOL;  Surgeon: Jeani Hawking, MD;  Location: [facility];  Service: Endoscopy;  Laterality: N/A;   gallstone removal     LAPAROTOMY N/A 10/06/2019   Procedure: EXPLORATORY LAPAROTOMY;  Surgeon: Berna Bue, MD;  Location: [facility] OR;  Service: General;  Laterality: N/A;    Medications:   Current Facility-Administered  Medications:    acetaminophen (TYLENOL) tablet 650 mg, 650 mg, Oral, Q6H PRN, Rolly Salter, MD   dronabinol (MARINOL) capsule 5 mg, 5 mg, Oral, BID AC, Rolly Salter, MD   famotidine (PEPCID) tablet 20 mg, 20 mg, Oral, Daily, Lynden Oxford M, MD, 20 mg at 06/10/23 1040   feeding supplement (ENSURE ENLIVE / ENSURE PLUS) liquid 237 mL, 237 mL, Oral, TID BM, Rolly Salter, MD, 237 mL at 06/10/23 1049   finasteride (PROSCAR) tablet 5 mg, 5 mg, Oral, Daily, Rolly Salter, MD, 5 mg at 06/10/23 1040   heparin injection 5,000 Units, 5,000 Units, Subcutaneous, Q8H, Rolly Salter, MD, 5,000 Units at 06/10/23 0523   hydrALAZINE (APRESOLINE) injection 10 mg, 10 mg, Intravenous, Q4H PRN, Alanda Slim, Taye T, MD   hydrOXYzine (ATARAX) tablet 25 mg, 25 mg, Oral, TID, Rolly Salter, MD, 25 mg at 06/10/23 1040   magic mouthwash, 10 mL, Oral, QID, Rolly Salter, MD, 10 mL at 06/10/23 1041   ondansetron (ZOFRAN) tablet 4 mg, 4 mg, Oral, Q6H PRN, 4 mg at 06/07/23 1502 **OR** ondansetron (ZOFRAN) injection 4 mg, 4 mg, Intravenous, Q6H PRN, Hillary Bow, DO   Oral care mouth rinse, 15 mL, Mouth Rinse, PRN, Rolly Salter, MD   pantoprazole (PROTONIX) EC tablet 40 mg, 40 mg, Oral, BID AC, Rolly Salter, MD, 40 mg at 06/10/23 1040   QUEtiapine (SEROQUEL) tablet 25 mg, 25 mg, Oral, QHS, Rolly Salter, MD, 25 mg at 06/09/23 2126   senna-docusate (Senokot-S) tablet 2 tablet, 2 tablet, Oral, BID, Rolly Salter, MD, 2 tablet at 06/10/23 1040  Allergies: Allergies  Allergen Reactions   Alprazolam Other (See Comments)   Cyclobenzaprine Other (See Comments)   Prednisone Palpitations and Other (See Comments)

## 2023-06-10 NOTE — Progress Notes (Signed)
Calorie Count Note  48 hour calorie count ordered.  Diet: dysphagia 3, thin liquids Supplements: Ensure Enlive TID, Magic Cup TID, Mighty Shakes TID with meals  Estimated Nutritional Needs:  Kcal:  1700-1900 Protein:  80-95 grams Fluid:  1.7-1.9 L  Day 1 (07/27)- Breakfast: 50 kcal and 0g protein Lunch: 115 kcal and 0g protein Dinner: 172 kcal and 13g protein Supplements: 700 kcal and 40g protein  Total intake: 1037 kcal (61% of minimum estimated needs)  53 protein (66% of minimum estimated needs)  Day 2 (07/28)- Breakfast: 113kcal and 7g protein Lunch: 86kcal and 1g protein Dinner: 163kcal and 12g protein Supplements: 350kcal and 20g protein  Total intake: 712 kcal (42% of minimum estimated needs)  40 protein (50% of minimum estimated needs)  Pt mentions that when he eats, the food goes in his mouth and he just loses his appetite. Pt inquiring about appetite stimulant. Discussed pt's request and results of calorie count with MD.   Nutrition Dx: Moderate Malnutrition related to chronic illness (dysphagia) as evidenced by moderate fat depletion, moderate muscle depletion.   Goal: Patient will meet greater than or equal to 90% of their needs   Intervention:  - Discontinue Calorie Count - Continue Ensure Enlive po TID, each supplement provides 350 kcal and 20 grams of protein. - Discontinue Mighty shakes and Magic Cup  Drusilla Kanner, RDN, LDN Clinical Nutrition

## 2023-06-10 NOTE — Progress Notes (Signed)
[facility] Progress Note Patient: Randy Merritt ZOX:[id number] DOB: [birth year] DOA: 06/03/2023  DOS: the patient was seen and examined on 06/10/2023  Brief hospital course: 82 yo M with PMH of cervical radiculopathy s/p ACDF in 2014, nontoxic goiter, HTN, dysphagia, CKD-3A, memory loss, chronic pain, anxiety and depression presenting with progressive dysphagia.  Patient is not a great historian.  Reportedly had progressive dysphagia over 2 weeks with poor p.o. intake and about 15 pound weight loss.  Patient's wife reports some dysphagia since he had his neck surgery.  He has been evaluated by [facility] outpatient.  Seems like he had EGD in June 2024 that showed narrowing UES from possible extrinsic compression from thyroid +/- cervical osteophytes).  EGD also showed possible subtle narrowing of LES.  Patient is also followed by general surgery, Dr. Georgana Curio for enlarging nontoxic goiter.  Currently being ruled for dysphagia.  GI, general surgery, speech therapy consulted. Neurosurgery was also consulted for osteophytes and recommended no intervention necessary for now. GI currently signed off as suspect the dysphagia is more related to extrinsic compression. SLP evaluated patient, MBS completed on 7/24.  Patient is currently on dysphagia 3 diet. Family states that dysphagia could be due to mirtazapine that is psychiatry started for insomnia which might have led to some throat swelling. Ate 66% of meals on calorie count. Started on Marinol. Psychiatry consulted as well.  Assessment and Plan: Dysphagia Initially there was concern for extrinsic compression. Extensive evaluation as above. Per family ongoing dysphagia since he has neck surgery but gotten worse in the last 2 weeks.   No airway compromise.   Per ENT, may be a candidate for Botox injection of cricopharyngeus, which can be performed by Laryngology in [place].   Per GI, EGD showed narrowing UES and possible subtle narrowing LES.  Suspect UES findings are from extrinsic compression (thyroid +/- cervical osteophytes). Patient had esophageal dilatation to 16mm about one month ago with NO improvement.  Per neurosurgery Dr. Maisie Fus, no success with osteophytectomy even if we think osteophytes are contributing to dysphagia although less likely. SLP took the patient for MBS.  Currently on dysphagia 3 diet. Per Dr. Dwain Sarna and Dr. Gerrit Friends thyroid is less likely contributing to patient's dysphagia.  No urgent surgical intervention recommended. Now concern is that his dysphagia is more likely associated with psychological issues. Dysphagia is also reportedly intermittently rather than continuous. Dietitian consulted for calorie count.  Ate 66% of his calorie requirement over 48 hours. Appreciate psychiatry consultation as well. Started on Marinol. Will monitor response. Continue Magic mouthwash.   Non-toxic multinodular goiter:  Goiter is confirmed to be causing extrinsic compression of airway on multiple CT scans now. TSH suppressed.  Free T4 normal. Currently does not have any evidence of hyperthyroidism. Will monitor   Essential hypertension: Normotensive Blood pressure stable.   CKD-3A: Stable Baseline serum creatinine around 1.4-1.3. Currently better than baseline. Monitor   Anxiety and depression:  Insomnia Patient sees PCP and psychiatry for managing his medications. Patient was on mirtazapine for insomnia but per wife they had some concerns for side effect and therefore they stopped using that medication. Patient was on trazodone before that and has not used that medication in last 2 weeks. Patient was prescribed Seroquel by PCP's colleague and per my discussion with wife and patient on 7/27 Seroquel has helped more than trazodone to help with the sleep. Has alprazolam listed as allergy but does not recall.  Per wife bupropion has been discontinued due to reported side effect  of dizziness.  Medication was  active on PTA list and therefore was continued in the hospital for 3 days, now discontinued. Patient has informed me that hydroxyzine helped his stomach symptoms on 7/27. Patient is aware currently continue Seroquel and hydroxyzine.   History of cervical radiculopathy s/p ACDF in 2014: Stable on CT   GERD Continue PPI as above.    Subjective: No acute complaint.  No nausea or vomiting.  Had a BM.  Had a good lunch.  Physical Exam: In mild distress. No thrush. S1-S2 present.  Data Reviewed: I have Reviewed nursing notes, Vitals, and Lab results. Discussed with psychiatry. Reviewed CBC and BMP. Reviewed her CBC and BMP.  Disposition: Status is: Inpatient Remains inpatient appropriate because: Awaiting oral intake.  Iprovement  heparin injection 5,000 Units Start: 06/07/23 2200 Place and maintain sequential compression device Start: 06/05/23 6213   Family Communication: Wife at bedside on 7/29. Level of care: Med-Surg   Vitals:   06/09/23 2044 06/10/23 0359 06/10/23 0724 06/10/23 1541  BP: 131/74 136/83 120/79 122/64  Pulse: 78 72 88 (!) 58  Resp:   16 16  Temp: 98.3 F (36.8 C) 98.3 F (36.8 C) (!) 97.4 F (36.3 C) (!) 97.5 F (36.4 C)  TempSrc: Oral Oral Oral Oral  SpO2: 100% 100% 100% 98%  Weight:      Height:         Author: Lynden Oxford, MD 06/10/2023 6:14 PM  Please look on [email or web address] to find out who is on call.

## 2023-06-11 DIAGNOSIS — R1312 Dysphagia, oropharyngeal phase: Secondary | ICD-10-CM | POA: Diagnosis not present

## 2023-06-11 MED ORDER — ENSURE ENLIVE PO LIQD: 237.0000 mL | Freq: Three times a day (TID) | ORAL | 0 refills | Status: AC

## 2023-06-11 MED ORDER — DOCUSATE SODIUM 100 MG PO CAPS: 100.0000 mg | ORAL_CAPSULE | Freq: Two times a day (BID) | ORAL | 0 refills | Status: AC

## 2023-06-11 MED ORDER — FAMOTIDINE 20 MG PO TABS: 20.0000 mg | ORAL_TABLET | Freq: Every day | ORAL | 0 refills | Status: DC

## 2023-06-11 MED ORDER — HYDROXYZINE HCL 25 MG PO TABS: 25.0000 mg | ORAL_TABLET | Freq: Three times a day (TID) | ORAL | 0 refills | Status: DC

## 2023-06-11 MED ORDER — DRONABINOL 5 MG PO CAPS: 5.0000 mg | ORAL_CAPSULE | Freq: Two times a day (BID) | ORAL | 0 refills | Status: DC

## 2023-06-11 MED ORDER — MAGIC MOUTHWASH: 10.0000 mL | Freq: Four times a day (QID) | ORAL | 0 refills | Status: DC

## 2023-06-11 MED ORDER — PANTOPRAZOLE SODIUM 40 MG PO TBEC: 40.0000 mg | DELAYED_RELEASE_TABLET | Freq: Two times a day (BID) | ORAL | 0 refills | Status: DC

## 2023-06-11 MED ORDER — QUETIAPINE FUMARATE 25 MG PO TABS: 25.0000 mg | ORAL_TABLET | Freq: Every day | ORAL | 0 refills | Status: AC

## 2023-06-11 MED ORDER — ONDANSETRON HCL 4 MG PO TABS: 4.0000 mg | ORAL_TABLET | Freq: Four times a day (QID) | ORAL | 0 refills | Status: AC | PRN

## 2023-06-11 MED ORDER — ASPIRIN 81 MG PO TBEC: 81.0000 mg | DELAYED_RELEASE_TABLET | Freq: Every day | ORAL | 0 refills | Status: AC

## 2023-06-11 NOTE — Plan of Care (Signed)

## 2023-06-11 NOTE — Progress Notes (Signed)
Discharge instructions reviewed with pt and his wife.  Copy of instructions given to pt. Pt/wife informed new scripts sent to his pharmacy for pick up, 1 printed script for magic mouth wash given to pt to have filled. Pt/wife had questions about some of the medication changes, able to read MD notes and answer those questions, also messaged Dr Allena Katz and information relayed to pt and his wife.  Pt and wife verbalized understanding of instructions and able to teach back.  Pt to be d/c'd via wheelchair with belongings, with wife.            To be escorted by hospital volunteer  Granite Godman,RN SWOT

## 2023-06-12 NOTE — Discharge Summary (Signed)
Physician Discharge Summary   Patient: Randy Merritt MRN: [id number] DOB: Feb 10, 1941  Admit date:     06/03/2023  Discharge date: 06/11/2023  Discharge Physician: Lynden Oxford  PCP: Lorenda Ishihara, MD  Recommendations at discharge:  Follow up with PCP in 1week. Follow up with General surgery and ENT as scheduled   Follow-up Information     Lorenda Ishihara, MD. Schedule an appointment as soon as possible for a visit in 2 week(s).   Specialty: Internal Medicine Why: with BMP lab to look at kidney and electrolytes Contact information: [address] [place] [address] [phone]         Darnell Level, MD. Schedule an appointment as soon as possible for a visit in 1 month(s).   Specialty: General Surgery Why: Appointment As Scheduled Contact information: [address] [place] [address] [phone]         Scarlette Ar, MD Follow up.   Specialty: Otolaryngology Why: Appointment As Scheduled Contact information: [address] [place] [address] [phone]                Discharge Diagnoses: Principal Problem:   Dysphagia Active Problems:   Non-toxic multinodular goiter   CKD (chronic kidney disease) stage 3, GFR 30-59 ml/min (HCC)   Essential (primary) hypertension   Cervical radiculopathy   History of cervical spinal arthrodesis   Memory loss   Anxiety and depression   Malnutrition of moderate degree  Brief hospital course: 82 yo M with PMH of cervical radiculopathy s/p ACDF in 2014, nontoxic goiter, HTN, dysphagia, CKD-3A, memory loss, chronic pain, anxiety and depression presenting with progressive dysphagia.  Patient is not a great historian.  Reportedly had progressive dysphagia over 2 weeks with poor p.o. intake and about 15 pound weight loss.  Patient's wife reports some dysphagia since he had his neck surgery.  He has been evaluated by [facility] GI outpatient.  Seems like he had EGD in  June 2024 that showed narrowing UES from possible extrinsic compression from thyroid +/- cervical osteophytes).  EGD also showed possible subtle narrowing of LES.  Patient is also followed by general surgery, Dr. Georgana Curio for enlarging nontoxic goiter.  Currently being ruled for dysphagia.  GI, general surgery, speech therapy consulted. Neurosurgery was also consulted for osteophytes and recommended no intervention necessary for now. GI currently signed off as suspect the dysphagia is more related to extrinsic compression. SLP evaluated patient, MBS completed on 7/24.  Patient is currently on dysphagia 3 diet. Family states that dysphagia could be due to mirtazapine that is psychiatry started for insomnia which might have led to some throat swelling. Ate 66% of meals on calorie count. Started on Marinol. Psychiatry consulted as well.  Assessment and Plan: Dysphagia Initially there was concern for extrinsic compression. Extensive evaluation as above. Per family ongoing dysphagia since he has neck surgery but gotten worse in the last 2 weeks.   No airway compromise.   Per ENT, may be a candidate for Botox injection of cricopharyngeus, which can be performed by Laryngology in [place].   Per GI, EGD showed narrowing UES and possible subtle narrowing LES. Suspect UES findings are from extrinsic compression (thyroid +/- cervical osteophytes). Patient had esophageal dilatation to 16mm about one month ago with NO improvement.  Per neurosurgery Dr. Maisie Fus, no success with osteophytectomy even if we think osteophytes are contributing to dysphagia although less likely. SLP took the patient for MBS.  Currently on dysphagia 3 diet. Per Dr.  Wakefield and Dr. Gerrit Friends thyroid is less likely contributing to patient's dysphagia.  No urgent surgical intervention recommended. Now concern is that his dysphagia is more likely associated with psychological issues. Dysphagia is also reportedly intermittently rather  than continuous. Dietitian consulted for calorie count.  Ate 66% of his calorie requirement over 48 hours. Appreciate psychiatry consultation as well. Started on Marinol. Will monitor response. Continue Magic mouthwash.   Non-toxic multinodular goiter:  Goiter is confirmed to be causing extrinsic compression of airway on multiple CT scans now. TSH suppressed.  Free T4 normal. Currently does not have any evidence of hyperthyroidism. Will monitor   Essential hypertension: Normotensive Blood pressure stable.   CKD-3A: Stable Baseline serum creatinine around 1.4-1.3. Currently better than baseline. Monitor   Anxiety and depression:  Insomnia Patient sees PCP and psychiatry for managing his medications. Patient was on mirtazapine for insomnia but per wife they had some concerns for side effect and therefore they stopped using that medication. Patient was on trazodone before that and has not used that medication in last 2 weeks. Patient was prescribed Seroquel by PCP's colleague and per my discussion with wife and patient on 7/27 Seroquel has helped more than trazodone to help with the sleep. Has alprazolam listed as allergy but does not recall.  Per wife bupropion has been discontinued due to reported side effect of dizziness.  Medication was active on PTA list and therefore was continued in the hospital for 3 days, now discontinued. Patient has informed me that hydroxyzine helped his stomach symptoms on 7/27. Patient is aware currently continue Seroquel and hydroxyzine.   History of cervical radiculopathy s/p ACDF in 2014: Stable on CT   GERD Continue PPI as above.    Pain control - [employer] Controlled Substance Reporting System database was reviewed. and patient was instructed, not to drive, operate heavy machinery, perform activities at heights, swimming or participation in water activities or provide baby-sitting services while on Pain, Sleep and Anxiety Medications; until  their outpatient Physician has advised to do so again. Also recommended to not to take more than prescribed Pain, Sleep and Anxiety Medications.  Consultants:  Psych  General surgery  Gastroenterology  Psychiatry  Procedures performed:  none  DISCHARGE MEDICATION: Allergies as of 06/11/2023       Reactions   Alprazolam Other (See Comments)   Cyclobenzaprine Other (See Comments)   Prednisone Palpitations, Other (See Comments)        Medication List     STOP taking these medications    amoxicillin 875 MG tablet Commonly known as: AMOXIL   aspirin 81 MG chewable tablet Replaced by: aspirin EC 81 MG tablet   cetirizine 10 MG tablet Commonly known as: ZYRTEC   esomeprazole 40 MG capsule Commonly known as: NEXIUM   metoprolol succinate 25 MG 24 hr tablet Commonly known as: TOPROL-XL   tiZANidine 2 MG tablet Commonly known as: ZANAFLEX   traZODone 100 MG tablet Commonly known as: DESYREL   triamcinolone 0.025 % ointment Commonly known as: KENALOG       TAKE these medications    acetaminophen 500 MG tablet Commonly known as: TYLENOL Take 2 tablets (1,000 mg total) by mouth every 8 (eight) hours. What changed:  when to take this reasons to take this additional instructions   aspirin EC 81 MG tablet Take 1 tablet (81 mg total) by mouth daily. Swallow whole. Replaces: aspirin 81 MG chewable tablet   docusate sodium 100 MG capsule Commonly known as: Colace Take 1  capsule (100 mg total) by mouth 2 (two) times daily.   dronabinol 5 MG capsule Commonly known as: MARINOL Take 1 capsule (5 mg total) by mouth 2 (two) times daily before lunch and supper.   famotidine 20 MG tablet Commonly known as: PEPCID Take 1 tablet (20 mg total) by mouth daily. What changed: when to take this   feeding supplement Liqd Take 237 mLs by mouth 3 (three) times daily between meals.   finasteride 5 MG tablet Commonly known as: PROSCAR Take 5 mg by mouth daily.    fluticasone 50 MCG/ACT nasal spray Commonly known as: FLONASE Place 2 sprays into both nostrils daily.   hydrOXYzine 25 MG tablet Commonly known as: ATARAX Take 1 tablet (25 mg total) by mouth 3 (three) times daily. What changed:  medication strength how much to take when to take this reasons to take this   magic mouthwash Soln Take 10 mLs by mouth 4 (four) times daily.   ondansetron 4 MG tablet Commonly known as: ZOFRAN Take 1 tablet (4 mg total) by mouth every 6 (six) hours as needed for nausea.   pantoprazole 40 MG tablet Commonly known as: PROTONIX Take 1 tablet (40 mg total) by mouth 2 (two) times daily before a meal.   QUEtiapine 25 MG tablet Commonly known as: SEROQUEL Take 1 tablet (25 mg total) by mouth at bedtime.   Rosuvastatin Calcium 10 MG Cpsp Take 10 mg by mouth daily.       Disposition: Home Diet recommendation: Regular diet  Discharge Exam: Vitals:   06/10/23 1541 06/10/23 2027 06/11/23 0414 06/11/23 0806  BP: 122/64 132/74 (!) 140/82 121/73  Pulse: (!) 58 65 63 76  Resp: 16   18  Temp: (!) 97.5 F (36.4 C) 98 F (36.7 C) 98.1 F (36.7 C) 97.6 F (36.4 C)  TempSrc: Oral Oral Oral   SpO2: 98% 100% 98% 100%  Weight:      Height:       General: Appear in no distress; no visible Abnormal Neck Mass Or lumps, Conjunctiva normal Cardiovascular: S1 and S2 Present, no Murmur, Respiratory: good respiratory effort, Bilateral Air entry present and CTA, no Crackles, no wheezes Abdomen: Bowel Sound present, Non tender  Extremities: no Pedal edema Neurology: alert and oriented to time, place, and person  Filed Weights   06/03/23 1238  Weight: 65.8 kg   Condition at discharge: stable  The results of significant diagnostics from this hospitalization (including imaging, microbiology, ancillary and laboratory) are listed below for reference.   Imaging Studies: DG Swallowing Func-Speech Pathology  Result Date: 06/05/2023 Table formatting from the  original result was not included. Modified Barium Swallow Study Patient Details Name: Hendry Boschen MRN: [id number] Date of Birth: 1941/03/21 Today's Date: 06/05/2023 HPI/PMH: HPI: Pt is an 82 yo male presenting with progressively worsening dysphagia x2 weeks. Per H&P note, pt reports having to spit up his secretions and 15 lb weight loss due to decreased PO intake. CT Soft Tissue Neck with unchanged large R and smaller L thyroid nodules with regional mass effect resulting in mild leftward displacement and narrowing of infraglottic airway. MBS 12/05/22 with "mild oral and moderate pharyngo-cervical esophageal dysphagia". At that time, recommended diet of Dys 3 with thin liquids using a liquid wash and intermittent dry swallows. Per [facility] consult note, pt may be a candidate for thyroidectomy or botox injection of cricopharyngeus. Per GI note, endoscopy showed narrowing UES and possible subtle narrowing LES with suspected findings from extrinsic compression (thyroid +/-  cervical osteophytes). Pt had esophageal dilation to 16 mm ~1 month ago with no improvement. PMH includes chronic pain, memory loss, depression, thrombocytopenia, cervical radiculopathy, HTN, aphasia, globus pharyngeus, goiter, CKD, HLD, oropharyngeal dysphagia, GERD Clinical Impression: Clinical Impression: Pt presents with a mild dysphagia primarily characterized by residue throughout his pharynx. Pt's suspected prominent osteophytes as well as cervical hardware present from prior ACDF results in signficant pharyngeal narrowing, which contributes to residue in the valleculae, pyriform sinuses, and posterior pharyngeal wall. Pt is independently initiating multiple swallows, which is somewhat effective at clearing this residue. Pt exhibits excellent airway protection, maintaing complete laryngeal vestibule closure during the swallow. He had no penetration/aspiration throughout all trials of thin liquids, nectar thick liquids, honey thick liquids, purees,  or solids. The pill was given with thin liquids and flouro remained on to follow down the esophagus with complete and prompt clearance. Recommend initiating diet of Dys 3 textures with thin liquids and meds given whole with liquids. Will f/u to discuss results of MBS with pt and his wife. Pt may benefit from continued ENT f/u for management of dysphagia symptoms as his oropharyngeal swallow is [facility]. Factors that may increase risk of adverse event in presence of aspiration [name]ance Coots 2021): No data recorded Recommendations/Plan: Swallowing Evaluation Recommendations Swallowing Evaluation Recommendations Recommendations: PO diet PO Diet Recommendation: Dysphagia 3 (Mechanical soft); Thin liquids (Level 0) Liquid Administration via: Cup; Straw Medication Administration: Whole meds with liquid Supervision: Patient able to self-feed Swallowing strategies  : Minimize environmental distractions; Slow rate; Small bites/sips Postural changes: Position pt fully upright for meals; Stay upright 30-60 min after meals Oral care recommendations: Oral care BID (2x/day) Recommended consults: Consider ENT consultation Treatment Plan Treatment Plan Treatment recommendations: Therapy as outlined in treatment plan below Follow-up recommendations: No SLP follow up Functional status assessment: Patient has had a recent decline in their functional status and demonstrates the ability to make significant improvements in function in a reasonable and predictable amount of time. Treatment frequency: Min 2x/week Treatment duration: 1 week Interventions: Patient/family education; Diet toleration management by SLP Recommendations Recommendations for follow up therapy are one component of a multi-disciplinary discharge planning process, led by the attending physician.  Recommendations may be updated based on patient status, additional functional criteria and insurance authorization. Assessment: Orofacial Exam: Orofacial Exam Oral Cavity: Oral  Hygiene: WFL Oral Cavity - Dentition: Missing dentition Orofacial Anatomy: WFL Oral Motor/Sensory Function: WFL Anatomy: Anatomy: Suspected cervical osteophytes; Presence of cervical hardware Boluses Administered: Boluses Administered Boluses Administered: Thin liquids (Level 0); Mildly thick liquids (Level 2, nectar thick); Moderately thick liquids (Level 3, honey thick); Solid; Puree  Oral Impairment Domain: Oral Impairment Domain Lip Closure: No labial escape Tongue control during bolus hold: Cohesive bolus between tongue to palatal seal Bolus preparation/mastication: Timely and efficient chewing and mashing Bolus transport/lingual motion: Brisk tongue motion Oral residue: Trace residue lining oral structures Location of oral residue : Tongue Initiation of pharyngeal swallow : Valleculae  Pharyngeal Impairment Domain: Pharyngeal Impairment Domain Soft palate elevation: No bolus between soft palate (SP)/pharyngeal wall (PW) Laryngeal elevation: Complete superior movement of thyroid cartilage with complete approximation of arytenoids to epiglottic petiole Anterior hyoid excursion: Partial anterior movement Epiglottic movement: Complete inversion Laryngeal vestibule closure: Complete, no air/contrast in laryngeal vestibule Pharyngeal stripping wave : Present - complete Pharyngoesophageal segment opening: Minimal distention/minimal duration, marked obstruction of flow Tongue base retraction: No contrast between tongue base and posterior pharyngeal wall (PPW) Pharyngeal residue: Collection of residue within or on pharyngeal structures  Location of pharyngeal residue: Valleculae; Pyriform sinuses; Pharyngeal wall  Esophageal Impairment Domain: Esophageal Impairment Domain Esophageal clearance upright position: Complete clearance, esophageal coating Pill: Pill Consistency administered: Thin liquids (Level 0) Thin liquids (Level 0): [facility] Penetration/Aspiration Scale Score: Penetration/Aspiration Scale Score 1.  Material  does not enter airway: Thin liquids (Level 0); Mildly thick liquids (Level 2, nectar thick); Moderately thick liquids (Level 3, honey thick); Puree; Solid; Pill Compensatory Strategies: Compensatory Strategies Compensatory strategies: Yes Straw: Effective Effective Straw: Thin liquid (Level 0)   General Information: Caregiver present: No  Diet Prior to this Study: NPO   Temperature : Normal   Respiratory Status: WFL   Supplemental O2: None (Room air)   History of Recent Intubation: No  Behavior/Cognition: Alert; Cooperative; Pleasant mood Self-Feeding Abilities: Able to self-feed Baseline vocal quality/speech: Normal Volitional Cough: Able to elicit Volitional Swallow: Able to elicit Exam Limitations: No limitations Goal Planning: Prognosis for improved oropharyngeal function: Good Barriers to Reach Goals: Time post onset No data recorded Patient/Family Stated Goal: to find a solution for ongoing swallowing problems Consulted and agree with results and recommendations: Patient; Nurse; Physician Pain: Pain Assessment Pain Assessment: No/denies pain Faces Pain Scale: 0 End of Session: Start Time:SLP Start Time (ACUTE ONLY): 1134 Stop Time: SLP Stop Time (ACUTE ONLY): 1152 Time Calculation:SLP Time Calculation (min) (ACUTE ONLY): 18 min Charges: SLP Evaluations $ SLP Speech Visit: 1 Visit SLP Evaluations $BSS Swallow: 1 Procedure $MBS Swallow: 1 Procedure $Swallowing Treatment: 1 Procedure SLP visit diagnosis: SLP Visit Diagnosis: Dysphagia, pharyngeal phase (R13.13) Past Medical History: Past Medical History: Diagnosis Date  Acid reflux   Anxiety   Arthritis   Depression   " a little"  Goiter   High cholesterol   History of blood transfusion   Perforated bowel (HCC)   Seasonal allergies  Past Surgical History: Past Surgical History: Procedure Laterality Date  ANTERIOR CERVICAL DECOMP/DISCECTOMY FUSION N/A 04/08/2013  Procedure: ANTERIOR CERVICAL DECOMPRESSION/DISCECTOMY FUSION 1 LEVEL;  Surgeon: Karn Cassis, MD;   Location: [facility];  Service: Neurosurgery;  Laterality: N/A;  Cervical five-six Anterior cervical decompression/diskectomy fusion  APPENDECTOMY N/A 10/06/2019  Procedure: APPENDECTOMY;  Surgeon: Berna Bue, MD;  Location: [facility] OR;  Service: General;  Laterality: N/A;  BIOPSY  01/30/2019  Procedure: BIOPSY;  Surgeon: Jeani Hawking, MD;  Location: [facility];  Service: Endoscopy;;  CERVICAL DISCECTOMY    x2  CHOLECYSTECTOMY    COLON RESECTION SIGMOID N/A 10/06/2019  Procedure: COLON RESECTION SIGMOID;  Surgeon: Berna Bue, MD;  Location: [facility] OR;  Service: General;  Laterality: N/A;  ESOPHAGEAL DILATION  01/30/2019  Procedure: ESOPHAGEAL DILATION;  Surgeon: Jeani Hawking, MD;  Location: [facility] ENDOSCOPY;  Service: Endoscopy;;  [place]  ESOPHAGOGASTRODUODENOSCOPY (EGD) WITH PROPOFOL N/A 01/30/2019  Procedure: ESOPHAGOGASTRODUODENOSCOPY (EGD) WITH PROPOFOL;  Surgeon: Jeani Hawking, MD;  Location: [facility] ENDOS[facility];  Service: Endoscopy;  Laterality: N/A;  gallstone removal    LAPAROTOMY N/A 10/06/2019  Procedure: EXPLORATORY LAPAROTOMY;  Surgeon: Berna Bue, MD;  Location: [facility] OR;  Service: General;  Laterality: N/A; Gwynneth Aliment, M.A., CF-SLP Speech Language Pathology, Acute Rehabilitation Services Secure Chat preferred [phone] 06/05/2023, 1:55 PM  CT Soft Tissue Neck W Contrast  Result Date: 06/03/2023 CLINICAL DATA:  Thyroid nodule. EXAM: CT NECK WITH CONTRAST TECHNIQUE: Multidetector CT imaging of the neck was performed using the standard protocol following the bolus administration of intravenous contrast. RADIATION DOSE REDUCTION: This exam was performed according to the departmental dose-optimization program which includes automated exposure control, adjustment of the mA  and/or kV according to patient size and/or use of iterative reconstruction technique. CONTRAST:  75mL OMNIPAQUE IOHEXOL 350 MG/ML SOLN COMPARISON:  CT neck 04/30/2023, thyroid ultrasound 10/17/2022 FINDINGS: Pharynx and larynx:  The nasal cavity and nasopharynx are unremarkable. The oral cavity and oropharynx are unremarkable. The parapharyngeal spaces are clear. The hypopharynx and larynx are unremarkable. The vocal folds are normal in appearance. The epiglottis is normal. There is no retropharyngeal collection. The airway is patent. Salivary glands: The parotid and submandibular glands are unremarkable. Thyroid: The large right thyroid nodule measuring up 2 4.7 cm AP x 4.6 cm TV x 6.5 cm cc and left thyroid nodule measuring 2.8 cm x 1.6 cm x 3.9 cm are not significantly changed, with foci of calcification on the right. There is unchanged mass effect with leftward deviation and mild narrowing of the airway. These nodules has been previously evaluated by ultrasound and biopsy. Lymph nodes: There is no pathologic lymphadenopathy in the neck. Vascular: Unremarkable. Limited intracranial: Unremarkable. Visualized orbits: Unremarkable. Mastoids and visualized paranasal sinuses: Clear. Skeleton: Postsurgical changes reflecting C5-C6 ACDF are again noted, without evidence of complication. There is also fusion across the C3-C4 disc space. Bulky anterior osteophytes throughout the cervical spine are unchanged. There is no new acute osseous abnormality or suspicious osseous lesion. Bulky bilateral stylohyoid ligament calcification is unchanged. Upper chest: The imaged lung apices are clear. Other: None. IMPRESSION: 1. Unchanged large right and smaller left thyroid nodules with regional mass effect resulting in mild leftward displacement and narrowing of the infraglottic airway. These nodules has been previously evaluated by ultrasound and biopsy. 2. No new or acute finding in the neck. Electronically Signed   By: Lesia Hausen M.D.   On: 06/03/2023 15:41   DG Chest 2 View  Result Date: 06/03/2023 CLINICAL DATA:  Shortness of breath EXAM: CHEST - 2 VIEW COMPARISON:  X-ray 05/21/2023 and older FINDINGS: Hyperinflation. No consolidation,  pneumothorax or effusion. Normal cardiopericardial silhouette without edema. Surgical clips in the right upper quadrant. Fixation hardware along the lower cervical spine. There is some narrowing of the trachea with shift from right-to-left at the thoracic inlet. Etiology is uncertain. Please correlate with prior neck CT scan of 04/30/2023 consistent with an enlarged thyroid gland. IMPRESSION: Hyperinflation.  No acute cardiopulmonary disease. Electronically Signed   By: Karen Kays M.D.   On: 06/03/2023 13:05   DG Chest Portable 1 View  Result Date: 05/21/2023 CLINICAL DATA:  Mucous. EXAM: PORTABLE CHEST 1 VIEW COMPARISON:  Chest radiograph 05/07/2023.  Neck CT 04/30/2023. FINDINGS: Clear lungs. Unchanged mild mass effect on the right aspect of the cervical trachea, consistent with known right thyromegaly. Normal heart size and mediastinal contours. No pleural effusion or pneumothorax. Visualized bones and upper abdomen are unremarkable. IMPRESSION: No evidence of acute cardiopulmonary disease. Unchanged mild mass effect on the right aspect of the cervical trachea, consistent with known right thyromegaly. Electronically Signed   By: Orvan Falconer M.D.   On: 05/21/2023 14:50   MR BRAIN WO CONTRAST  Result Date: 05/20/2023 CLINICAL DATA:  Gait instability. Altered mental status, confusion and hearing loss. EXAM: MRI HEAD WITHOUT CONTRAST TECHNIQUE: Multiplanar, multiecho pulse sequences of the brain and surrounding structures were obtained without intravenous contrast. COMPARISON:  MRI brain 06/30/2020. FINDINGS: Brain: No acute infarct or hemorrhage. Unchanged moderate chronic small-vessel disease and generalized volume loss with slight left temporal predominance. No mass or midline shift. No hydrocephalus or extra-axial collection. No abnormal susceptibility. Vascular: Normal flow voids. Skull and upper cervical spine:  Normal marrow signal. Sinuses/Orbits: Unremarkable. Other: None. IMPRESSION: 1. No acute  intracranial abnormality or mass. 2. Unchanged moderate chronic small-vessel disease and generalized volume loss with slight left temporal predominance. Electronically Signed   By: Orvan Falconer M.D.   On: 05/20/2023 07:43    Microbiology: Results for orders placed or performed during the hospital encounter of 08/03/21  Resp Panel by RT-PCR (Flu A&B, Covid) Nasopharyngeal Swab     Status: None   Collection Time: 08/03/21  7:42 AM   Specimen: Nasopharyngeal Swab; Nasopharyngeal(NP) swabs in vial transport medium  Result Value Ref Range Status   SARS Coronavirus 2 by RT PCR NEGATIVE NEGATIVE Final    Comment: (NOTE) SARS-CoV-2 target nucleic acids are NOT DETECTED.  The SARS-CoV-2 RNA is generally detectable in upper respiratory specimens during the acute phase of infection. The lowest concentration of SARS-CoV-2 viral copies this assay can detect is 138 copies/mL. A negative result does not preclude SARS-Cov-2 infection and should not be used as the sole basis for treatment or other patient management decisions. A negative result may occur with  improper specimen collection/handling, submission of specimen other than nasopharyngeal swab, presence of viral mutation(s) within the areas targeted by this assay, and inadequate number of viral copies(<138 copies/mL). A negative result must be combined with clinical observations, patient history, and epidemiological information. The expected result is Negative.  Fact Sheet for Patients:  BloggerCourse.com  Fact Sheet for Healthcare Providers:  SeriousBroker.it  This test is no t yet approved or cleared by the [place] FDA and  has been authorized for detection and/or diagnosis of SARS-CoV-2 by FDA under an Emergency Use Authorization (EUA). This EUA will remain  in effect (meaning this test can be used) for the duration of the COVID-19 declaration under Section 564(b)(1) of the Act,  21 U.S.C.section 360bbb-3(b)(1), unless the authorization is terminated  or revoked sooner.       Influenza A by PCR NEGATIVE NEGATIVE Final   Influenza B by PCR NEGATIVE NEGATIVE Final    Comment: (NOTE) The Xpert Xpress SARS-CoV-2/FLU/RSV plus assay is intended as an aid in the diagnosis of influenza from Nasopharyngeal swab specimens and should not be used as a sole basis for treatment. Nasal washings and aspirates are unacceptable for Xpert Xpress SARS-CoV-2/FLU/RSV testing.  Fact Sheet for Patients: BloggerCourse.com  Fact Sheet for Healthcare Providers: SeriousBroker.it  This test is not yet approved or cleared by the [place] FDA and has been authorized for detection and/or diagnosis of SARS-CoV-2 by FDA under an Emergency Use Authorization (EUA). This EUA will remain in effect (meaning this test can be used) for the duration of the COVID-19 declaration under Section 564(b)(1) of the Act, 21 U.S.C. section 360bbb-3(b)(1), unless the authorization is terminated or revoked.  Performed at [facility], [address], [place], [place] [address]    Labs: CBC: Recent Labs  Lab 06/05/23 0101 06/06/23 6213 06/08/23 0203 06/09/23 0038 06/10/23 0103  WBC 2.8* 2.6* 3.0* 3.2* 3.6*  NEUTROABS  --  1.0*  --   --   --   HGB 11.2* 12.1* 11.2* 10.8* 11.0*  HCT 32.2* 35.4* 33.0* 32.3* 33.4*  MCV 93.3 94.1 94.6 93.1 95.2  PLT 115* 124* 113* 114* 129*   Basic Metabolic Panel: Recent Labs  Lab 06/05/23 0101 06/06/23 0822 06/07/23 0846 06/08/23 0203 06/09/23 0038 06/10/23 0103 06/11/23 0008  NA 141 142 140 138 137 137  --   K 3.4* 3.4* 3.8 4.0 4.0 3.9  --  CL 108 108 105 104 102 102  --   CO2 24 27 26 28 28 27   --   GLUCOSE 105* 124* 111* 91 113* 109*  --   BUN 18 6* 6* 13 15 15   --   CREATININE 1.26* 1.12 1.16 1.04 1.10 1.21  --   CALCIUM 8.7* 9.0 9.2 8.9 8.9 8.9  --   MG 2.0 2.0  --  2.1  2.2 2.3  --   PHOS 3.3 3.0  --  3.1 3.0 3.3 3.3   Liver Function Tests: Recent Labs  Lab 06/05/23 0101  ALBUMIN 3.2*   CBG: No results for input(s): "GLUCAP" in the last 168 hours.  Discharge time spent: greater than 30 minutes.  Author: Lynden Oxford, MD  [facility] 06/11/2023

## 2023-06-13 ENCOUNTER — Other Ambulatory Visit (HOSPITAL_COMMUNITY): Payer: Self-pay

## 2023-06-13 MED ORDER — PEG 3350-KCL-NA BICARB-NACL 420 G PO SOLR
4000.0000 mL | Freq: Once | ORAL | 0 refills | Status: AC
  Filled 2023-06-13: qty 4000, 1d supply, fill #0

## 2023-06-19 DIAGNOSIS — K5900 Constipation, unspecified: Secondary | ICD-10-CM | POA: Diagnosis not present

## 2023-06-21 ENCOUNTER — Other Ambulatory Visit (HOSPITAL_COMMUNITY): Payer: Self-pay

## 2023-06-21 MED ORDER — NYSTATIN 100000 UNIT/ML MT SUSP
10.0000 mL | Freq: Four times a day (QID) | ORAL | 0 refills | Status: DC
Start: 2023-06-21 — End: 2023-09-09
  Filled 2023-06-21: qty 600, 15d supply, fill #0

## 2023-06-22 ENCOUNTER — Encounter (HOSPITAL_COMMUNITY): Payer: Self-pay

## 2023-06-22 ENCOUNTER — Emergency Department (HOSPITAL_COMMUNITY): Payer: Medicare HMO

## 2023-06-22 ENCOUNTER — Emergency Department (HOSPITAL_COMMUNITY)
Admission: EM | Admit: 2023-06-22 | Discharge: 2023-06-22 | Disposition: A | Discharge status: Home / Self Care | Payer: Medicare HMO | Attending: Emergency Medicine | Admitting: Emergency Medicine

## 2023-06-22 ENCOUNTER — Other Ambulatory Visit: Payer: Self-pay

## 2023-06-22 DIAGNOSIS — Z7982 Long term (current) use of aspirin: Secondary | ICD-10-CM | POA: Diagnosis not present

## 2023-06-22 DIAGNOSIS — R093 Abnormal sputum: Secondary | ICD-10-CM | POA: Diagnosis not present

## 2023-06-22 DIAGNOSIS — R1310 Dysphagia, unspecified: Secondary | ICD-10-CM | POA: Diagnosis not present

## 2023-06-22 LAB — COMPREHENSIVE METABOLIC PANEL
ALT: 25 U/L (ref 0–44)
AST: 28 U/L (ref 15–41)
Albumin: 4 g/dL (ref 3.5–5.0)
Alkaline Phosphatase: 109 U/L (ref 38–126)
Anion gap: 9 (ref 5–15)
BUN: 21 mg/dL (ref 8–23)
CO2: 27 mmol/L (ref 22–32)
Calcium: 9.7 mg/dL (ref 8.9–10.3)
Chloride: 102 mmol/L (ref 98–111)
Creatinine, Ser: 1.82 mg/dL — ABNORMAL HIGH (ref 0.61–1.24)
GFR, Estimated: 37 mL/min — ABNORMAL LOW (ref >60)
Glucose, Bld: 170 mg/dL — ABNORMAL HIGH (ref 70–99)
Potassium: 3.9 mmol/L (ref 3.5–5.1)
Sodium: 138 mmol/L (ref 135–145)
Total Bilirubin: 1.3 mg/dL — ABNORMAL HIGH (ref 0.3–1.2)
Total Protein: 6.9 g/dL (ref 6.5–8.1)

## 2023-06-22 LAB — CBC WITH DIFFERENTIAL/PLATELET
Abs Immature Granulocytes: 0.01 10*3/uL (ref 0.00–0.07)
Basophils Absolute: 0 10*3/uL (ref 0.0–0.1)
Basophils Relative: 0 %
Eosinophils Absolute: 0 10*3/uL (ref 0.0–0.5)
Eosinophils Relative: 1 %
HCT: 38.6 % — ABNORMAL LOW (ref 39.0–52.0)
Hemoglobin: 12.8 g/dL — ABNORMAL LOW (ref 13.0–17.0)
Immature Granulocytes: 0 %
Lymphocytes Relative: 38 %
Lymphs Abs: 1.4 10*3/uL (ref 0.7–4.0)
MCH: 31.4 pg (ref 26.0–34.0)
MCHC: 33.2 g/dL (ref 30.0–36.0)
MCV: 94.6 fL (ref 80.0–100.0)
Monocytes Absolute: 0.3 10*3/uL (ref 0.1–1.0)
Monocytes Relative: 9 %
Neutro Abs: 1.9 10*3/uL (ref 1.7–7.7)
Neutrophils Relative %: 52 %
Platelets: 214 10*3/uL (ref 150–400)
RBC: 4.08 MIL/uL — ABNORMAL LOW (ref 4.22–5.81)
RDW: 12.7 % (ref 11.5–15.5)
WBC: 3.7 10*3/uL — ABNORMAL LOW (ref 4.0–10.5)
nRBC: 0 % (ref 0.0–0.2)

## 2023-06-22 LAB — TSH: TSH: 0.179 u[IU]/mL — ABNORMAL LOW (ref 0.350–4.500)

## 2023-06-22 LAB — T4, FREE: Free T4: 1.09 ng/dL (ref 0.61–1.12)

## 2023-06-22 MED ORDER — ALUM & MAG HYDROXIDE-SIMETH 200-200-20 MG/5ML PO SUSP
30.0000 mL | Freq: Once | ORAL | Status: AC
  Administered 2023-06-22: 30 mL via ORAL
  Filled 2023-06-22: qty 30

## 2023-06-22 MED ORDER — SODIUM CHLORIDE 0.9 % IV BOLUS
500.0000 mL | Freq: Once | INTRAVENOUS | Status: AC
  Administered 2023-06-22: 500 mL via INTRAVENOUS

## 2023-06-22 NOTE — ED Triage Notes (Signed)
Pt came in via POV d/t difficulty swallowing. Pt states that he was here 2 weeks ago for that same reason & was admitted for eval. States it started giving him problems again this morning. Pt very anxious in triage & denies pain. A/Ox4.

## 2023-06-22 NOTE — ED Provider Notes (Signed)
[place] EMERGENCY DEPARTMENT AT [facility] Provider Note   CSN: [id number] Arrival date & time: 06/22/23  1013     History  Chief Complaint  Patient presents with   Difficulty Sallowing    Randy Merritt is a 82 y.o. male.  Patient is 82 year old male who presents with difficulty swallowing.  Per chart review, he was recently admitted for similar symptoms last month.  He has a history of hyperlipidemia, anxiety, goiter.  He reportedly had a few week history of some difficulty swallowing.  He was admitted in July and was evaluated by general surgery because it was at first felt to be related to compression of his throat from the goiter.  They did not feel that this was the likely cause of his dysphagia.  He was seen by gastroenterology and had an EGD.  They had felt initially that it was due to external compression although surgery did not agree.  He was seen by ENT who did not find an etiology.  There was a question that this was related to his anxiety and somatization.  He was seen by psychiatry who could not definitively say this but did recommend an appetite suppressant.  He was able to increase his p.o. intake while he was in the hospital and was supposed to arrange outpatient follow-up with the specialist.  Patient says it seemed to have acted up last night.  He feels like he has mucus in his throat that comes up.  He thought that it was better with the Magic mouthwash while he was in the hospital but was not apparently given a prescription for this.  He says he took some medicine for constipation last night and that upset his stomach and the spitting up phlegm increased.       Home Medications Prior to Admission medications   Medication Sig Start Date End Date Taking? Authorizing Provider  acetaminophen (TYLENOL) 500 MG tablet Take 2 tablets (1,000 mg total) by mouth every 8 (eight) hours. Patient taking differently: Take 1,000 mg by mouth every 8 (eight) hours as  needed. pain 10/15/19   Sherrie George, PA-C  aspirin EC 81 MG tablet Take 1 tablet (81 mg total) by mouth daily. Swallow whole. 06/11/23 06/10/24  Rolly Salter, MD  docusate sodium (COLACE) 100 MG capsule Take 1 capsule (100 mg total) by mouth 2 (two) times daily. 06/11/23   Rolly Salter, MD  dronabinol (MARINOL) 5 MG capsule Take 1 capsule (5 mg total) by mouth 2 (two) times daily before lunch and supper. 06/11/23   Rolly Salter, MD  famotidine (PEPCID) 20 MG tablet Take 1 tablet (20 mg total) by mouth daily. 06/11/23   Rolly Salter, MD  feeding supplement (ENSURE ENLIVE / ENSURE PLUS) LIQD Take 237 mLs by mouth 3 (three) times daily between meals. 06/11/23   Rolly Salter, MD  finasteride (PROSCAR) 5 MG tablet Take 5 mg by mouth daily.    [provider]  fluticasone (FLONASE) 50 MCG/ACT nasal spray Place 2 sprays into both nostrils daily.    [provider]  hydrOXYzine (ATARAX) 25 MG tablet Take 1 tablet (25 mg total) by mouth 3 (three) times daily. 06/11/23   Rolly Salter, MD  magic mouthwash (multi-ingredient) oral suspension Take 10 mLs by mouth 4 (four) times daily. 06/21/23   Rolly Salter, MD  magic mouthwash SOLN Take 10 mLs by mouth 4 (four) times daily. 06/11/23   Rolly Salter, MD  ondansetron (  ZOFRAN) 4 MG tablet Take 1 tablet (4 mg total) by mouth every 6 (six) hours as needed for nausea. 06/11/23   Rolly Salter, MD  pantoprazole (PROTONIX) 40 MG tablet Take 1 tablet (40 mg total) by mouth 2 (two) times daily before a meal. 06/11/23   Rolly Salter, MD  QUEtiapine (SEROQUEL) 25 MG tablet Take 1 tablet (25 mg total) by mouth at bedtime. 06/11/23   Rolly Salter, MD  Rosuvastatin Calcium 10 MG CPSP Take 10 mg by mouth daily. 05/26/21   [provider]      Allergies    Alprazolam, Cyclobenzaprine, and Prednisone    Review of Systems   Review of Systems  Constitutional:  Negative for chills, diaphoresis, fatigue and fever.  HENT:   Positive for trouble swallowing. Negative for congestion, rhinorrhea, sneezing and voice change.   Eyes: Negative.   Respiratory:  Negative for cough, chest tightness and shortness of breath.   Cardiovascular:  Negative for chest pain and leg swelling.  Gastrointestinal:  Negative for abdominal pain, blood in stool, diarrhea, nausea and vomiting.  Genitourinary:  Negative for difficulty urinating, flank pain, frequency and hematuria.  Musculoskeletal:  Negative for arthralgias and back pain.  Skin:  Negative for rash.  Neurological:  Negative for dizziness, speech difficulty, weakness, numbness and headaches.    Physical Exam Updated Vital Signs BP 130/77   Pulse 84   Temp 97.6 F (36.4 C) (Oral)   Resp 16   SpO2 100%  Physical Exam Constitutional:      Appearance: He is well-developed.  HENT:     Head: Normocephalic and atraumatic.     Mouth/Throat:     Comments: Small white coating of the tongue Eyes:     Pupils: Pupils are equal, round, and reactive to light.  Cardiovascular:     Rate and Rhythm: Normal rate and regular rhythm.     Heart sounds: Normal heart sounds.  Pulmonary:     Effort: Pulmonary effort is normal. No respiratory distress.     Breath sounds: Normal breath sounds. No wheezing or rales.  Chest:     Chest wall: No tenderness.  Abdominal:     General: Bowel sounds are normal.     Palpations: Abdomen is soft.     Tenderness: There is no abdominal tenderness. There is no guarding or rebound.  Musculoskeletal:        General: Normal range of motion.     Cervical back: Normal range of motion and neck supple.  Lymphadenopathy:     Cervical: No cervical adenopathy.  Skin:    General: Skin is warm and dry.     Findings: No rash.  Neurological:     General: No focal deficit present.     Mental Status: He is alert and oriented to person, place, and time.     ED Results / Procedures / Treatments   Labs (all labs ordered are listed, but only abnormal  results are displayed) Labs Reviewed  COMPREHENSIVE METABOLIC PANEL - Abnormal; Notable for the following components:      Result Value   Glucose, Bld 170 (*)    Creatinine, Ser 1.82 (*)    Total Bilirubin 1.3 (*)    GFR, Estimated 37 (*)    All other components within normal limits  CBC WITH DIFFERENTIAL/PLATELET - Abnormal; Notable for the following components:   WBC 3.7 (*)    RBC 4.08 (*)    Hemoglobin 12.8 (*)    HCT  38.6 (*)    All other components within normal limits  TSH - Abnormal; Notable for the following components:   TSH 0.179 (*)    All other components within normal limits  T4, FREE    EKG None  Radiology DG Chest 2 View  Result Date: 06/22/2023 CLINICAL DATA:  Increased sputum production. Recent hospitalization for similar symptoms. EXAM: CHEST - 2 VIEW COMPARISON:  Two-view chest x-ray 06/03/2023 FINDINGS: The heart size and mediastinal contours are within normal limits. Both lungs are clear. The visualized skeletal structures are unremarkable. IMPRESSION: Negative two view chest x-ray Electronically Signed   By: Marin Roberts M.D.   On: 06/22/2023 13:03    Procedures Procedures    Medications Ordered in ED Medications  alum & mag hydroxide-simeth (MAALOX/MYLANTA) 200-200-20 MG/5ML suspension 30 mL (30 mLs Oral Given 06/22/23 1132)  sodium chloride 0.9 % bolus 500 mL (0 mLs Intravenous Stopped 06/22/23 1359)    ED Course/ Medical Decision Making/ A&P                                 Medical Decision Making Amount and/or Complexity of Data Reviewed Labs: ordered. Radiology: ordered.  Risk OTC drugs.   Patient is a 82 year old male who presents with dysphagia.  He was recently admitted for similar symptoms last month.  He had a fairly extensive evaluation by multiple specialist.  He has some upcoming outpatient appointments with those same specialist.  Labs were checked today.  His electrolytes are nonconcerning.  His TSH is low but his free T4  is normal.  He does not have other symptoms that would be more suggestive of thyroid storm.  He was able to drink some Sprite here in the emergency department without difficulty.  He has some Magic mouthwash that was prescribed yesterday.  Advised him to pick that up and start taking that.  He was discharged home in good condition.  Return precautions were given.    Final Clinical Impression(s) / ED Diagnoses Final diagnoses:  Dysphagia, unspecified type    Rx / DC Orders ED Discharge Orders     None         Rolan Bucco, MD 06/22/23 1831

## 2023-06-22 NOTE — Discharge Instructions (Signed)
Follow-up with your specialists as discussed.  Return to emergency room if you have any worsening symptoms.

## 2023-06-22 NOTE — ED Notes (Signed)
Pt says his left arm feels different as if it going to sleep. Pt states the sensation feels the same it just feels different than his right arm.

## 2023-06-22 NOTE — ED Notes (Signed)
RN sent order requisition for T4 to be added

## 2023-06-23 DIAGNOSIS — R1310 Dysphagia, unspecified: Secondary | ICD-10-CM | POA: Diagnosis not present

## 2023-06-24 ENCOUNTER — Other Ambulatory Visit (HOSPITAL_COMMUNITY): Payer: Self-pay

## 2023-07-01 DIAGNOSIS — R259 Unspecified abnormal involuntary movements: Secondary | ICD-10-CM | POA: Diagnosis not present

## 2023-07-01 DIAGNOSIS — E782 Mixed hyperlipidemia: Secondary | ICD-10-CM | POA: Diagnosis not present

## 2023-07-01 DIAGNOSIS — R1310 Dysphagia, unspecified: Secondary | ICD-10-CM | POA: Diagnosis not present

## 2023-07-01 DIAGNOSIS — F411 Generalized anxiety disorder: Secondary | ICD-10-CM | POA: Diagnosis not present

## 2023-07-03 DIAGNOSIS — E049 Nontoxic goiter, unspecified: Secondary | ICD-10-CM | POA: Diagnosis not present

## 2023-07-03 DIAGNOSIS — K224 Dyskinesia of esophagus: Secondary | ICD-10-CM | POA: Diagnosis not present

## 2023-07-03 DIAGNOSIS — R1314 Dysphagia, pharyngoesophageal phase: Secondary | ICD-10-CM | POA: Diagnosis not present

## 2023-07-05 ENCOUNTER — Other Ambulatory Visit: Payer: Self-pay

## 2023-07-05 ENCOUNTER — Emergency Department (HOSPITAL_COMMUNITY)
Admission: EM | Admit: 2023-07-05 | Discharge: 2023-07-06 | Discharge status: Against Medical Advice | Payer: Medicare HMO | Source: Home / Self Care

## 2023-07-05 ENCOUNTER — Encounter (HOSPITAL_COMMUNITY): Payer: Self-pay | Admitting: *Deleted

## 2023-07-05 ENCOUNTER — Emergency Department (HOSPITAL_COMMUNITY): Payer: Medicare HMO

## 2023-07-05 ENCOUNTER — Emergency Department (HOSPITAL_COMMUNITY)
Admission: EM | Admit: 2023-07-05 | Discharge: 2023-07-05 | Disposition: A | Discharge status: Home / Self Care | Payer: Medicare HMO | Attending: Emergency Medicine | Admitting: Emergency Medicine

## 2023-07-05 DIAGNOSIS — Z5321 Procedure and treatment not carried out due to patient leaving prior to being seen by health care provider: Secondary | ICD-10-CM | POA: Insufficient documentation

## 2023-07-05 DIAGNOSIS — Z7982 Long term (current) use of aspirin: Secondary | ICD-10-CM | POA: Insufficient documentation

## 2023-07-05 DIAGNOSIS — R531 Weakness: Secondary | ICD-10-CM

## 2023-07-05 DIAGNOSIS — R112 Nausea with vomiting, unspecified: Secondary | ICD-10-CM | POA: Insufficient documentation

## 2023-07-05 DIAGNOSIS — R42 Dizziness and giddiness: Secondary | ICD-10-CM | POA: Diagnosis not present

## 2023-07-05 DIAGNOSIS — Z8639 Personal history of other endocrine, nutritional and metabolic disease: Secondary | ICD-10-CM | POA: Diagnosis not present

## 2023-07-05 DIAGNOSIS — R5383 Other fatigue: Secondary | ICD-10-CM | POA: Diagnosis not present

## 2023-07-05 LAB — URINALYSIS, ROUTINE W REFLEX MICROSCOPIC
Glucose, UA: NEGATIVE mg/dL
Hgb urine dipstick: NEGATIVE
Ketones, ur: 20 mg/dL — AB
Leukocytes,Ua: NEGATIVE
Nitrite: NEGATIVE
Protein, ur: 100 mg/dL — AB
Specific Gravity, Urine: 1.027 (ref 1.005–1.030)
WBC, UA: >50 WBC/hpf (ref 0–5)
pH: 5 (ref 5.0–8.0)

## 2023-07-05 LAB — BASIC METABOLIC PANEL
Anion gap: 20 — ABNORMAL HIGH (ref 5–15)
BUN: 21 mg/dL (ref 8–23)
CO2: 23 mmol/L (ref 22–32)
Calcium: 10.1 mg/dL (ref 8.9–10.3)
Chloride: 98 mmol/L (ref 98–111)
Creatinine, Ser: 1.58 mg/dL — ABNORMAL HIGH (ref 0.61–1.24)
GFR, Estimated: 43 mL/min — ABNORMAL LOW (ref >60)
Glucose, Bld: 98 mg/dL (ref 70–99)
Potassium: 3.7 mmol/L (ref 3.5–5.1)
Sodium: 141 mmol/L (ref 135–145)

## 2023-07-05 LAB — CBC
HCT: 40.6 % (ref 39.0–52.0)
Hemoglobin: 13.7 g/dL (ref 13.0–17.0)
MCH: 31.8 pg (ref 26.0–34.0)
MCHC: 33.7 g/dL (ref 30.0–36.0)
MCV: 94.2 fL (ref 80.0–100.0)
Platelets: 178 10*3/uL (ref 150–400)
RBC: 4.31 MIL/uL (ref 4.22–5.81)
RDW: 12.3 % (ref 11.5–15.5)
WBC: 3.1 10*3/uL — ABNORMAL LOW (ref 4.0–10.5)
nRBC: 0 % (ref 0.0–0.2)

## 2023-07-05 LAB — CBG MONITORING, ED: Glucose-Capillary: 98 mg/dL (ref 70–99)

## 2023-07-05 MED ORDER — SODIUM CHLORIDE 0.9 % IV BOLUS
1000.0000 mL | Freq: Once | INTRAVENOUS | Status: AC
  Administered 2023-07-05: 1000 mL via INTRAVENOUS

## 2023-07-05 MED ORDER — ONDANSETRON 4 MG PO TBDP
4.0000 mg | ORAL_TABLET | Freq: Once | ORAL | Status: AC
  Administered 2023-07-05: 4 mg via ORAL
  Filled 2023-07-05: qty 1

## 2023-07-05 NOTE — ED Triage Notes (Signed)
Patient states he was seen at [facility] today states when he got out of his car he felt dizzy after standing for awhile he was able to walk inside. States this past week his speech has been off and his left arm is weak at times , he will be eating and drop things.

## 2023-07-05 NOTE — ED Notes (Signed)
Pt could not collect urine sample at this time.

## 2023-07-05 NOTE — ED Triage Notes (Signed)
Patient reports intermittent nausea and emesis onset last week . Seen here this afternoon blood tests done  and was discharged .

## 2023-07-05 NOTE — ED Notes (Signed)
Awaiting patient from lobby 

## 2023-07-05 NOTE — ED Notes (Signed)
Patient approached this tech in the lobby stating that he has waited long enough to be seen by a provider and is checking out of ED.

## 2023-07-05 NOTE — ED Provider Notes (Signed)
[place] EMERGENCY DEPARTMENT AT [facility] Provider Note   CSN: [id number] Arrival date & time: 07/05/23  1424     History  Chief Complaint  Patient presents with   Weakness    Randy Merritt is a 82 y.o. male.  82 yo M with a chief complaints of fatigue.  He tells me that he has had trouble swallowing and that he has not really had much to eat or drink.  He tells me is a problem with his neck and that he is seeing ENT for this but does not feel like they have been able to help much so far.  He went to urgent care to see what they could do and they encouraged him to come here for evaluation.  He had an episode earlier in the week where he dropped a thing of applesauce.  He thinks it was due to arm weakness.  He has trouble describing how long the weakness last suggested that he dropped applesauce on the carpet.  Denies any weakness currently.   Weakness      Home Medications Prior to Admission medications   Medication Sig Start Date End Date Taking? Authorizing Provider  acetaminophen (TYLENOL) 500 MG tablet Take 2 tablets (1,000 mg total) by mouth every 8 (eight) hours. Patient taking differently: Take 1,000 mg by mouth every 8 (eight) hours as needed. pain 10/15/19   Sherrie George, PA-C  aspirin EC 81 MG tablet Take 1 tablet (81 mg total) by mouth daily. Swallow whole. 06/11/23 06/10/24  Rolly Salter, MD  docusate sodium (COLACE) 100 MG capsule Take 1 capsule (100 mg total) by mouth 2 (two) times daily. 06/11/23   Rolly Salter, MD  dronabinol (MARINOL) 5 MG capsule Take 1 capsule (5 mg total) by mouth 2 (two) times daily before lunch and supper. 06/11/23   Rolly Salter, MD  famotidine (PEPCID) 20 MG tablet Take 1 tablet (20 mg total) by mouth daily. 06/11/23   Rolly Salter, MD  feeding supplement (ENSURE ENLIVE / ENSURE PLUS) LIQD Take 237 mLs by mouth 3 (three) times daily between meals. 06/11/23   Rolly Salter, MD  finasteride (PROSCAR) 5 MG tablet  Take 5 mg by mouth daily.    [provider]  fluticasone (FLONASE) 50 MCG/ACT nasal spray Place 2 sprays into both nostrils daily.    [provider]  hydrOXYzine (ATARAX) 25 MG tablet Take 1 tablet (25 mg total) by mouth 3 (three) times daily. 06/11/23   Rolly Salter, MD  magic mouthwash (multi-ingredient) oral suspension Take 10 mLs by mouth 4 (four) times daily. 06/21/23   Rolly Salter, MD  magic mouthwash SOLN Take 10 mLs by mouth 4 (four) times daily. 06/11/23   Rolly Salter, MD  ondansetron (ZOFRAN) 4 MG tablet Take 1 tablet (4 mg total) by mouth every 6 (six) hours as needed for nausea. 06/11/23   Rolly Salter, MD  pantoprazole (PROTONIX) 40 MG tablet Take 1 tablet (40 mg total) by mouth 2 (two) times daily before a meal. 06/11/23   Rolly Salter, MD  QUEtiapine (SEROQUEL) 25 MG tablet Take 1 tablet (25 mg total) by mouth at bedtime. 06/11/23   Rolly Salter, MD  Rosuvastatin Calcium 10 MG CPSP Take 10 mg by mouth daily. 05/26/21   [provider]      Allergies    Alprazolam, Cyclobenzaprine, and Prednisone    Review of Systems   Review of Systems  Neurological:  Positive for weakness.    Physical Exam Updated Vital Signs BP (!) 139/93 (BP Location: Left Arm)   Pulse (!) 110   Temp 98.3 F (36.8 C) (Oral)   Resp 19   Ht 5\' 8"  (1.727 m)   Wt 59.4 kg   SpO2 99%   BMI 19.92 kg/m  Physical Exam Vitals and nursing note reviewed.  Constitutional:      Appearance: He is well-developed.  HENT:     Head: Normocephalic and atraumatic.  Eyes:     Pupils: Pupils are equal, round, and reactive to light.  Neck:     Vascular: No JVD.  Cardiovascular:     Rate and Rhythm: Normal rate and regular rhythm.     Heart sounds: No murmur heard.    No friction rub. No gallop.  Pulmonary:     Effort: No respiratory distress.     Breath sounds: No wheezing.  Abdominal:     General: There is no distension.     Tenderness: There is no abdominal  tenderness. There is no guarding or rebound.  Musculoskeletal:        General: Normal range of motion.     Cervical back: Normal range of motion and neck supple.  Skin:    Coloration: Skin is not pale.     Findings: No rash.  Neurological:     Mental Status: He is alert and oriented to person, place, and time.     GCS: GCS eye subscore is 4. GCS verbal subscore is 5. GCS motor subscore is 6.     Cranial Nerves: Cranial nerves 2-12 are intact.     Sensory: Sensation is intact.     Motor: Motor function is intact.     Coordination: Coordination is intact.     Comments: No obvious weakness.  Benign neurologic exam.  Psychiatric:        Behavior: Behavior normal.     ED Results / Procedures / Treatments   Labs (all labs ordered are listed, but only abnormal results are displayed) Labs Reviewed  BASIC METABOLIC PANEL - Abnormal; Notable for the following components:      Result Value   Creatinine, Ser 1.58 (*)    GFR, Estimated 43 (*)    Anion gap 20 (*)    All other components within normal limits  CBC - Abnormal; Notable for the following components:   WBC 3.1 (*)    All other components within normal limits  URINALYSIS, ROUTINE W REFLEX MICROSCOPIC - Abnormal; Notable for the following components:   Color, Urine AMBER (*)    APPearance CLOUDY (*)    Bilirubin Urine SMALL (*)    Ketones, ur 20 (*)    Protein, ur 100 (*)    Bacteria, UA RARE (*)    Non Squamous Epithelial 6-10 (*)    All other components within normal limits  CBG MONITORING, ED    EKG EKG Interpretation Date/Time:  Friday July 05 2023 14:17:46 EDT Ventricular Rate:  113 PR Interval:  132 QRS Duration:  72 QT Interval:  336 QTC Calculation: 460 R Axis:   74  Text Interpretation: Sinus tachycardia Right atrial enlargement Septal infarct , age undetermined Abnormal ECG No significant change since last tracing Confirmed by Melene Plan [phone]) on 07/05/2023 4:49:49 PM  Radiology DG Chest Port 1  View  Result Date: 07/05/2023 CLINICAL DATA:  Fatigue EXAM: PORTABLE CHEST 1 VIEW COMPARISON:  X-ray 06/22/2023 FINDINGS: The heart size and mediastinal contours  are within normal limits. Hyperinflation. No consolidation, pneumothorax or effusion. No edema. Degenerative changes of the spine. Fixation hardware along the lower cervical spine at the edge of the imaging field. There is mass effect with some density to the right of the trachea, unchanged from previous. Please correlate for a thyroid nodule as seen on CT scan of the neck from 06/03/2023 IMPRESSION: Hyperinflation. No acute cardiopulmonary disease. Known thyroid lesion Electronically Signed   By: Karen Kays M.D.   On: 07/05/2023 17:55    Procedures Procedures    Medications Ordered in ED Medications  sodium chloride 0.9 % bolus 1,000 mL (1,000 mLs Intravenous New Bag/Given 07/05/23 1718)    ED Course/ Medical Decision Making/ A&P                                 Medical Decision Making Amount and/or Complexity of Data Reviewed Labs: ordered. Radiology: ordered.   82 yo M with a chief complaints of difficulty swallowing.  This been an ongoing issue for him.  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  Plans were to see ENT in the office.  Had seen them recently and they had plans to do further studies.  He is here because that has not improved.  He also had an episode where he dropped some applesauce on the ground.  He thinks that his arm was weak but has trouble giving me any timeline other than he had that solitary episode where he had dropped the applesauce.  He has no weakness currently.  Seems less likely to be a TIA based on that history.  Patient with a very minimal if any rise in his creatinine.  His anion gap is a bit elevated.  I suspect this could be a ketoacidosis based on his history of decreased oral intake.  Will give a bolus of IV fluids.  Awaiting his UA to assess for  ketones.  He tells me he thinks he might have pneumonia.  Will obtain a chest x-ray.  Chest x-ray independently interpreted by me without focal infiltrate.  UA does have ketones though they are just mildly elevated.  His heart rate however decree significantly with 1 L of IV fluids.  He is now in the 70s on my repeat exam.  Will have the patient follow-up as an outpatient.  Encouraged to return for worsening.  6:30 PM:  I have discussed the diagnosis/risks/treatment options with the patient.  Evaluation and diagnostic testing in the emergency department does not suggest an emergent condition requiring admission or immediate intervention beyond what has been performed at this time.  They will follow up with PCP. We also discussed returning to the ED immediately if new or worsening sx occur. We discussed the sx which are most concerning (e.g., sudden worsening pain, fever, inability to tolerate by mouth) that necessitate immediate return. Medications administered to the patient during their visit and any new prescriptions provided to the patient are listed below.  Medications given during this visit Medications  sodium chloride 0.9 % bolus 1,000 mL (1,000 mLs Intravenous New Bag/Given 07/05/23 1718)     The patient appears reasonably screen and/or stabilized for discharge and I doubt any other medical condition or other [facility] requiring further screening, evaluation, or treatment in the ED at this time prior to discharge.         Final Clinical Impression(s) / ED Diagnoses  Final diagnoses:  Weakness    Rx / DC Orders ED Discharge Orders     None         Melene Plan, DO 07/05/23 1830

## 2023-07-05 NOTE — Discharge Instructions (Signed)
It seems like you are a bit dehydrated today.  Please try to increase your fluid intake at home.  He was seen the ear nose and throat doctor who recommended to try a diet that helps when you have trouble swallowing.  Please try and follow their instructions.  Please return for worsening symptoms.

## 2023-07-09 DIAGNOSIS — F411 Generalized anxiety disorder: Secondary | ICD-10-CM | POA: Diagnosis not present

## 2023-07-09 DIAGNOSIS — R1310 Dysphagia, unspecified: Secondary | ICD-10-CM | POA: Diagnosis not present

## 2023-07-09 DIAGNOSIS — I951 Orthostatic hypotension: Secondary | ICD-10-CM | POA: Diagnosis not present

## 2023-07-16 DIAGNOSIS — J398 Other specified diseases of upper respiratory tract: Secondary | ICD-10-CM | POA: Diagnosis not present

## 2023-07-16 DIAGNOSIS — E042 Nontoxic multinodular goiter: Secondary | ICD-10-CM | POA: Diagnosis not present

## 2023-07-16 DIAGNOSIS — E049 Nontoxic goiter, unspecified: Secondary | ICD-10-CM | POA: Diagnosis not present

## 2023-07-16 DIAGNOSIS — E0590 Thyrotoxicosis, unspecified without thyrotoxic crisis or storm: Secondary | ICD-10-CM | POA: Diagnosis not present

## 2023-08-15 ENCOUNTER — Encounter (INDEPENDENT_AMBULATORY_CARE_PROVIDER_SITE_OTHER): Payer: Self-pay | Admitting: Otolaryngology

## 2023-08-15 ENCOUNTER — Ambulatory Visit (INDEPENDENT_AMBULATORY_CARE_PROVIDER_SITE_OTHER): Payer: Medicare HMO | Admitting: Otolaryngology

## 2023-08-15 VITALS — BP 122/69 | HR 82 | Ht 68.0 in | Wt 146.0 lb

## 2023-08-15 DIAGNOSIS — Z981 Arthrodesis status: Secondary | ICD-10-CM

## 2023-08-15 DIAGNOSIS — R1310 Dysphagia, unspecified: Secondary | ICD-10-CM | POA: Diagnosis not present

## 2023-08-15 DIAGNOSIS — M2578 Osteophyte, vertebrae: Secondary | ICD-10-CM | POA: Diagnosis not present

## 2023-08-15 DIAGNOSIS — E049 Nontoxic goiter, unspecified: Secondary | ICD-10-CM

## 2023-08-15 DIAGNOSIS — J387 Other diseases of larynx: Secondary | ICD-10-CM | POA: Diagnosis not present

## 2023-08-15 DIAGNOSIS — K219 Gastro-esophageal reflux disease without esophagitis: Secondary | ICD-10-CM

## 2023-08-15 MED ORDER — FAMOTIDINE 20 MG PO TABS: 20.0000 mg | ORAL_TABLET | Freq: Two times a day (BID) | ORAL | 1 refills | Status: AC

## 2023-08-15 NOTE — Progress Notes (Signed)
ENT CONSULT:  Reason for Consult: dysphagia   HPI: Randy Merritt is an 82 y.o. male with hx of a large thyroid goiter, f/b Dr Gerrit Friends, hx of ACDF few years ago, who is here for evaluation of longstanding dysphagia symptoms prior to his planned thyroidectomy.  He is accompanied by his wife who is the primary caregiver and provided most of the history due to patient's memory problems which is his baseline. He had EGD done June 2024, which revealed narrowing of UES and subtotal narrowing of LES, for which she underwent dilation with no symptom improvement.  He was then admitted to the hospital due to acute worsening of dysphagia in early August 2024, and again GI was consulted, and records indicate that per GI his dysphagia symptoms were thought to be due to large thyroid goiter and compressive symptoms as well as cervical osteophyte.  His swallowing symptoms improved after the admission.  He has history of GERD on Famotidine in pm, and Pantoprazole BID.  He had a recent MBS which showed mild pharynx cervical dysphagia, with residuals in the hypopharynx but no aspiration or penetration.  He did three sessions of swallow therapy and did not experience any changes.   Records Reviewed:  GI consult note 06/04/23 HPI: Randy Merritt is a 82 y.o. male admitted ongoing dysphagia and weight loss.  Ongoing, but worsening, problems for months.  Endoscopy June 2024 by Dr. 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  Prior neck surgery.  Osteophytes and thyromegaly with imaging suggsting mass effect.  Has lost weight due to progressive inability to eat solids, and now, liquids.   Dysphagia.  Endoscopy showed narrowing UES and possible subtle narrowing LES.  Suspect UES findings are from extrinsic compression (thyroid +/- cervical osteophytes).  Patient had esophageal dilatation to 16mm about one month ago with NO improvement.  Do  not think patient's dysphagia is an esophageal process but, rather, a matter of extrinsic compression of the esophagus.   Plan:    No further GI work-up or intervention is likely to be of benefit. Will need ongoing input from ENT and CCS and maybe neurosurgery (for possible osteophytes, prior neck surgery). [facility] will sign-off; please call with questions; thank you for the consultation.  EGD Op Note 01/30/19 Findings: No endoscopic abnormality was evident in the esophagus to explain the patient's complaint of dysphagia. It was decided, however, to proceed with dilation of the entire esophagus. A guidewire was placed and the scope was withdrawn. Dilation was performed with a Savary dilator with no resistance at 12.8 mm. The dilation site was examined following endoscope reinsertion and showed no change.   Office note by Dr Ernestene Kiel 07/03/23 He is able to eat all solid foods without coughing or choking, and not coughing or choking with liquids.  He is eating now, but in June, he was having a hard time swallowing one day and went to the hospital.  Was told he had mini-stroke   Randy Merritt is a 82 y.o. year old male who presents for trouble swallowing. He is accompanied by an adult male.  He has been experiencing difficulty swallowing, progressively worsening over the past few months. This issue affects all types of food, including solids, liquids, and pills, and has been progressively worsening over the past few months. He used to consume Ensure but now finds it difficult to swallow. He has lost 30 pounds this year due to his swallowing  difficulties. An upper GI endoscopy performed in June 2024 did not reveal any tumors or mucosal abnormalities ([facility]). He does not report any vomiting or regurgitation of undigested food. His breathing and speech are unaffected, although he occasionally experiences a tightening sensation in his throat during the night. He maintains hydration by drinking enough  water to take his medication. In the past, he underwent a procedure to stretch his esophagus and is hoping for something similar now.  He reports a constant production of mucus from his throat, the cause of which is unknown. He does not experience heartburn.  He has never smoked and quit drinking alcohol 40 years ago.  Supplemental Information: He has a goiter and is being followed by Dr. Gerrit Friends. The patient suggests Dr. Gerrit Friends is awaiting ENT Input on whether or not the goiter could be a cause or component of his dysphagia issue.   Dr Ardine Eng note 07/16/23 Multiple thyroid nodules  Enlarged thyroid  Tracheal deviation   Patient returns for follow-up. 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 Patient also has mild hyperthyroidism based on recent laboratory studies.  Patient continues under evaluation for dysphagia. He has been previously seen by speech-language pathology. He has had a recent upper endoscopy. He is scheduled for a second ENT consultation in October for possible Botox therapy.  Today we discussed the fact that he does need to undergo thyroidectomy for management of his multinodular thyroid goiter with compressive symptoms, tracheal deviation, and borderline hyperthyroidism. However, I would like to allow him to complete his evaluation by ENT prior to scheduling any surgical procedure. Therefore the patient will plan to keep his consult appointment in October. They will contact me once this evaluation is completed and I will review the recommendations from ENT. We will make a decision at that time regarding timing of thyroid surgery.  Patient and his wife are happy with this plan. We will be in touch following his consultation with ENT in October.   Recent MBS was relatively unremarkable.   ED note from 06/22/23 Patient is 82 year old male who presents with difficulty swallowing. Per chart review, he was  recently admitted for similar symptoms last month. He has a history of hyperlipidemia, anxiety, goiter. He reportedly had a few week history of some difficulty swallowing. He was admitted in July and was evaluated by general surgery because it was at first felt to be related to compression of his throat from the goiter. They did not feel that this was the likely cause of his dysphagia. He was seen by gastroenterology and had an EGD. They had felt initially that it was due to external compression although surgery did not agree. He was seen by ENT who did not find an etiology. There was a question that this was related to his anxiety and somatization. He was seen by psychiatry who could not definitively say this but did recommend an appetite suppressant. He was able to increase his p.o. intake while he was in the hospital and was supposed to arrange outpatient follow-up with the specialist. Patient says it seemed to have acted up last night. He feels like he has mucus in his throat that comes up. He thought that it was better with the Magic mouthwash while he was in the hospital but was not apparently given a prescription for this. He says he took some medicine for constipation last night and that upset his stomach and the spitting up phlegm increased.  Past Medical History:  Diagnosis Date   Acid reflux    Anxiety    Arthritis    Depression    " a little"   Goiter    High cholesterol    History of blood transfusion    Perforated bowel (HCC)    Seasonal allergies     Past Surgical History:  Procedure Laterality Date   ANTERIOR CERVICAL DECOMP/DISCECTOMY FUSION N/A 04/08/2013   Procedure: ANTERIOR CERVICAL DECOMPRESSION/DISCECTOMY FUSION 1 LEVEL;  Surgeon: Karn Cassis, MD;  Location: [facility];  Service: Neurosurgery;  Laterality: N/A;  Cervical five-six Anterior cervical decompression/diskectomy fusion   APPENDECTOMY N/A 10/06/2019   Procedure: APPENDECTOMY;  Surgeon: Berna Bue,  MD;  Location: [facility] OR;  Service: General;  Laterality: N/A;   BIOPSY  01/30/2019   Procedure: BIOPSY;  Surgeon: Jeani Hawking, MD;  Location: [facility];  Service: Endoscopy;;   CERVICAL DISCECTOMY     x2   CHOLECYSTECTOMY     COLON RESECTION SIGMOID N/A 10/06/2019   Procedure: COLON RESECTION SIGMOID;  Surgeon: Berna Bue, MD;  Location: [facility] OR;  Service: General;  Laterality: N/A;   ESOPHAGEAL DILATION  01/30/2019   Procedure: ESOPHAGEAL DILATION;  Surgeon: Jeani Hawking, MD;  Location: [facility];  Service: Endoscopy;;  Savary   ESOPHAGOGASTRODUODENOSCOPY (EGD) WITH PROPOFOL N/A 01/30/2019   Procedure: ESOPHAGOGASTRODUODENOSCOPY (EGD) WITH PROPOFOL;  Surgeon: Jeani Hawking, MD;  Location: [facility];  Service: Endoscopy;  Laterality: N/A;   gallstone removal     LAPAROTOMY N/A 10/06/2019   Procedure: EXPLORATORY LAPAROTOMY;  Surgeon: Berna Bue, MD;  Location: [facility] OR;  Service: General;  Laterality: N/A;    Family History  Problem Relation Age of Onset   Healthy Mother    Healthy Father    Hypertension Sister    Hypertension Sister    Diabetes Sister    Hypertension Brother    Thyroid disease Neg Hx     Social History:  reports that he quit smoking about 64 years ago. His smoking use included cigarettes. He started smoking about 65 years ago. He has a 0.3 pack-year smoking history. He has never used smokeless tobacco. He reports that he does not drink alcohol and does not use drugs.  Allergies:  Allergies  Allergen Reactions   Alprazolam Other (See Comments)   Cyclobenzaprine Other (See Comments)   Prednisone Palpitations and Other (See Comments)    Medications: I have reviewed the patient's current medications.  The PMH, PSH, Medications, Allergies, and SH were reviewed and updated.  ROS: Constitutional: Negative for fever, weight loss and weight gain. Cardiovascular: Negative for chest pain and dyspnea on exertion. Respiratory: Is not experiencing  shortness of breath at rest. Gastrointestinal: Negative for nausea and vomiting. Neurological: Negative for headaches. Psychiatric: The patient is not nervous/anxious  Blood pressure 122/69, pulse 82, height 5\' 8"  (1.727 m), weight 146 lb (66.2 kg), SpO2 98%.  PHYSICAL EXAM:  Exam: General: Well-developed, well-nourished Communication and Voice: Mildly raspy Respiratory Respiratory effort: Equal inspiration and expiration without stridor Cardiovascular Peripheral Vascular: Warm extremities with equal color/perfusion Eyes: No nystagmus with equal extraocular motion bilaterally Neuro/Psych/Balance: Patient oriented to person, place, and time; Appropriate mood and affect; Gait is intact with no imbalance; Cranial nerves I-XII are intact Head and Face Inspection: Normocephalic and atraumatic without mass or lesion Palpation: Facial skeleton intact without bony stepoffs Salivary Glands: No mass or tenderness Facial Strength: Facial motility symmetric and full bilaterally ENT Pinna: External ear intact and fully developed External  canal: Canal is patent with intact skin Tympanic Membrane: Clear and mobile External Nose: No scar or anatomic deformity Internal Nose: Septum is deviated to the left. No polyp, or purulence. Mucosal edema and erythema present.  Bilateral inferior turbinate hypertrophy.  Lips, Teeth, and gums: Mucosa and teeth intact and viable TMJ: No pain to palpation with full mobility Oral cavity/oropharynx: No erythema or exudate, no lesions present Nasopharynx: No mass or lesion with intact mucosa Hypopharynx: Intact mucosa with pooling of secretions in piriform sinuses worse on the right as well as along the vallecula and postcricoid area Larynx Glottic: Full true vocal cord mobility without lesion or mass Supraglottic: Normal appearing epiglottis and AE folds Interarytenoid Space: Moderate pachydermia edema Subglottic Space: Patent without lesion or  edema Neck Neck and Trachea: Midline trachea without mass or lesion Thyroid: No mass or nodularity Lymphatics: No lymphadenopathy  Procedure: Preoperative diagnosis: Chronic dysphagia history of ACDF large thyroid goiter  Postoperative diagnosis:   Same + GERD LPR  Procedure: Flexible fiberoptic laryngoscopy  Surgeon: Ashok Croon, MD  Anesthesia: Topical lidocaine and Afrin Complications: None Condition is stable throughout exam  Indications and consent:  The patient presents to the clinic with Indirect laryngoscopy view was incomplete. Thus it was recommended that they undergo a flexible fiberoptic laryngoscopy. All of the risks, benefits, and potential complications were reviewed with the patient preoperatively and verbal informed consent was obtained.  Procedure: The patient was seated upright in the clinic. Topical lidocaine and Afrin were applied to the nasal cavity. After adequate anesthesia had occurred, I then proceeded to pass the flexible telescope into the nasal cavity. The nasal cavity was patent without rhinorrhea or polyp. The nasopharynx was also patent without mass or lesion. The base of tongue was visualized and was normal. There were no signs of pooling of secretions in the piriform sinuses. The true vocal folds were mobile bilaterally. There were no signs of glottic or supraglottic mucosal lesion or mass. There was moderate to severe interarytenoid pachydermia and post cricoid edema. The telescope was then slowly withdrawn and the patient tolerated the procedure throughout.  Studies Reviewed:   Modified Barium Swallow  Clinical Impression: Pt presents with a mild dysphagia primarily characterized by residue throughout his pharynx. Pt's suspected prominent osteophytes as well as cervical hardware present from prior ACDF results in signficant pharyngeal narrowing, which contributes to residue in the valleculae, pyriform sinuses, and posterior pharyngeal wall. Pt is  independently initiating multiple swallows, which is somewhat effective at clearing this residue. Pt exhibits excellent airway protection, maintaing complete laryngeal vestibule closure during the swallow. He had no penetration/aspiration throughout all trials of thin liquids, nectar thick liquids, honey thick liquids, purees, or solids. The pill was given with thin liquids and flouro remained on to follow down the esophagus with complete and prompt clearance. Recommend initiating diet of Dys 3 textures with thin liquids and meds given whole with liquids. Will f/u to discuss results of MBS with pt and his wife. Pt may benefit from continued ENT f/u for management of dysphagia symptoms as his oropharyngeal swallow is [facility].   MBS 12/05/22 Pt presents with mild oral and moderate pharyngo-cervical esophageal dysphagia. Pt noted to have secretions retained in pharynx - which likely attribute to his symptoms.  Pt demonstrates piecemealing across boluses- compensatory for his dysphagia.     Pharyngeal swallow marked by decreased laryngeal elevation/closure resulting laryngeal penetration of thin liquids - most notably with sequential swallows.  Pharyngeal retention noted across all consistencies - mostly  at pyriform sinuses- which he mostly senses and decreases with cued liquid or reflexive dry swallows.   Head postures unable to be attempted due to his limited cervical ROM.  Pt with a single episode of significantly delayed swallow with honey via tsp - as barium aggregated at pyriform sinus for several seconds prior to swallow- although pt stated he had swallows.     He did require extra liquids to transit barium tablet into pharynx  from oral cavity- after first liquid swallow not effective. Tablet then stalled at vallecular space - with pt pointing more distal - He required pudding to transit the tablet into esophagus.  Barium tablet than halted below cervical hardware - liquid swallows effective to facilitate  clearance. Of note, pt reports these symptoms are consistent with when things "stick".      SLP questions if pt may have some component of obstructive dysphagia from cervical hardware but also suspect potential component of "tight UES" given trace backflow of liquid observed with swallow.   Please see still images cut and pasted into report below.    Assessment/Plan: Encounter Diagnoses  Name Primary?   Dysphagia, unspecified type Yes   Osteophyte of cervical spine    History of cervical spinal arthrodesis    Thyroid goiter     82 year old male with longstanding symptoms of dysphagia that intermittently worsen, with extensive workup in the past including upper endoscopy in 2020 and again in June 2024 which demonstrated mild narrowing of UES and septal narrowing of LES to 16 mm, without any changes in his symptoms, GI assessment indicative of dysphagia due to cervical osteophyte and large thyroid goiter.  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  He is planned to undergo thyroidectomy with Dr. Gerrit Friends due to compressive symptoms.  Here for evaluation of dysphagia  Dysphagia -I have reviewed extensive records of prior workup which included to modified barium swallow studies and upper endoscopy by GI x 2 and both concluded that the likely source of dysphagia is pharyngeal and cervical secondary to presence of ACDF plate and cervical osteophyte immediately above it.  Additionally I reviewed CT of the neck which demonstrated a large thyroid goiter with substernal extension which certainly could contribute to his trouble with swallowing -Exam with evidence of intact movement of bilateral vocal folds, no masses or lesions, but evidence of pooling of secretions along the vallecula and bilateral piriforms worse on the right.  -Although I suspect that his  dysphagia symptoms are multifactorial, the mass effect from the thyroid goiter is one of the likely causes of his sx -Additionally there is evidence of osteophyte and cervical hardware that could contribute to trouble with swallowing -Not a candidate for Botox injection into CP/UES muscle due to the fact that there is no significant CP hypertrophy on MBS -On my review of modified barium swallow studies there is no evidence of UES stenosis or aspiration -Consider returning to swallow therapy -We discussed that surgery for substernal thyroid goiter will likely improve his swallowing function 2. GERD LPR -Continue medical management with famotidine 20 mg at night and pantoprazole 20 mg twice daily 3.  Cervical osteophyte  -Referral to spine surgery to review imaging and consider if he needs/is a surgical candidate for osteophyte removal -discussed with the patient that osteophyte removal has significant risks and its of the procedure have to be  considered   -Return after thyroid surgery for repeat evaluation and to consider additional interventions if swallowing issues will not resolve  Thank you for allowing me to participate in the care of this patient. Please do not hesitate to contact me with any questions or concerns.   Ashok Croon, MD Otolaryngology [facility] Phone: [phone] Fax: [phone]    08/15/2023, 2:59 PM

## 2023-08-15 NOTE — Patient Instructions (Addendum)
-   see Spine Surgery for evaluation of spine hardware and osteophyte - see Dr Gerrit Friends to discuss thyroid surgery  - consider swallow therapy  - RTC 6 months

## 2023-08-20 DIAGNOSIS — L84 Corns and callosities: Secondary | ICD-10-CM | POA: Diagnosis not present

## 2023-08-20 DIAGNOSIS — M79671 Pain in right foot: Secondary | ICD-10-CM | POA: Diagnosis not present

## 2023-08-20 DIAGNOSIS — I739 Peripheral vascular disease, unspecified: Secondary | ICD-10-CM | POA: Diagnosis not present

## 2023-08-20 DIAGNOSIS — B351 Tinea unguium: Secondary | ICD-10-CM | POA: Diagnosis not present

## 2023-08-20 DIAGNOSIS — M2012 Hallux valgus (acquired), left foot: Secondary | ICD-10-CM | POA: Diagnosis not present

## 2023-08-20 DIAGNOSIS — L600 Ingrowing nail: Secondary | ICD-10-CM | POA: Diagnosis not present

## 2023-08-20 DIAGNOSIS — M79672 Pain in left foot: Secondary | ICD-10-CM | POA: Diagnosis not present

## 2023-08-21 DIAGNOSIS — F411 Generalized anxiety disorder: Secondary | ICD-10-CM | POA: Diagnosis not present

## 2023-08-21 DIAGNOSIS — K219 Gastro-esophageal reflux disease without esophagitis: Secondary | ICD-10-CM | POA: Diagnosis not present

## 2023-08-21 DIAGNOSIS — I10 Essential (primary) hypertension: Secondary | ICD-10-CM | POA: Diagnosis not present

## 2023-08-28 DIAGNOSIS — N1831 Chronic kidney disease, stage 3a: Secondary | ICD-10-CM | POA: Diagnosis not present

## 2023-08-28 DIAGNOSIS — K5901 Slow transit constipation: Secondary | ICD-10-CM | POA: Diagnosis not present

## 2023-08-28 DIAGNOSIS — R7303 Prediabetes: Secondary | ICD-10-CM | POA: Diagnosis not present

## 2023-08-28 DIAGNOSIS — E559 Vitamin D deficiency, unspecified: Secondary | ICD-10-CM | POA: Diagnosis not present

## 2023-08-28 DIAGNOSIS — E042 Nontoxic multinodular goiter: Secondary | ICD-10-CM | POA: Diagnosis not present

## 2023-09-02 ENCOUNTER — Ambulatory Visit: Payer: Medicare HMO | Admitting: Podiatry

## 2023-09-06 ENCOUNTER — Encounter: Payer: Self-pay | Admitting: Surgery

## 2023-09-06 ENCOUNTER — Ambulatory Visit: Payer: Self-pay | Admitting: Surgery

## 2023-09-06 ENCOUNTER — Telehealth: Payer: Self-pay | Admitting: Surgery

## 2023-09-06 NOTE — H&P (Signed)
REFERRING PHYSICIAN: Samara Snide, MD  PROVIDER: Allianna Beaubien Myra Rude, MD   Chief Complaint: New Consultation (Non-toxic multinodular thyroid goiter)  History of Present Illness:  Patient is referred by Dr. Ocie Cornfield for surgical evaluation and recommendations regarding an enlarging nontoxic multinodular thyroid goiter. Patient has had thyroid nodules dating back at least 12 years. He underwent fine-needle aspiration biopsy in 2012 with benign cytopathology. His most recent ultrasound was performed October 17, 2022. This showed an enlarged thyroid gland with the right lobe measuring 6.7 x 3.6 x 5 cm in the left lobe measuring 6.7 x 2.8 x 2.6 cm. There were bilateral nodules. Dominant nodules had increased in size since his last study. Patient complains of dysphagia. He has undergone evaluation by speech-language pathology. Esophagram does not show any extrinsic compression. He appears to have some functional issues with swallowing. He has been referred to ENT for consideration for Botox injection. Patient has also undergone previous cervical spine fusion through an anterior approach. This may have some relevance to his dysphagia. Patient also complains of shortness of breath. It is not clear what precipitates shortness of breath when discussed with the patient. There is no family history of thyroid disease and specifically no history of thyroid cancer. His most recent TSH level from April 2024 was normal at 0.52. Patient is apparently scheduled for upper endoscopy at [facility] gastroenterology on June 5. Patient has also been referred to ENT for consideration for Botox injection for his dysphagia. Today the patient presents accompanied by his wife to discuss the potential for thyroid surgery for enlarging thyroid nodules with compressive symptoms.  Review of Systems: A complete review of systems was obtained from the patient. I have reviewed this information and discussed as appropriate with the  patient. See HPI as well for other ROS.  Review of Systems  Constitutional: Negative.  HENT:  Dysphagia  Eyes: Negative.  Respiratory: Positive for shortness of breath.  Cardiovascular: Negative.  Gastrointestinal: Negative.  Genitourinary: Negative.  Musculoskeletal: Negative.  Skin: Negative.  Neurological: Negative.  Endo/Heme/Allergies: Negative.  Psychiatric/Behavioral: Positive for memory loss.    Medical History: Past Medical History:  Diagnosis Date  Anxiety  GERD (gastroesophageal reflux disease)  Thyroid disease   Patient Active Problem List  Diagnosis  Dysphagia  Enlarged thyroid  Multiple thyroid nodules   Past Surgical History:  Procedure Laterality Date  Neck surgery    Allergies  Allergen Reactions  Alprazolam Other (See Comments)  Cyclobenzaprine Other (See Comments)  Lorazepam Other (See Comments)  Prednisone Other (See Comments) and Palpitations   Current Outpatient Medications on File Prior to Visit  Medication Sig Dispense Refill  cetirizine (ZYRTEC) 10 MG tablet Take 10 mg by mouth once daily  clonazePAM (KLONOPIN) 0.5 MG tablet TAKE 1/2 TABLET BY MOUTH EVERY 12 HOURS AS NEEDED FOR ANXIETY  esomeprazole (NEXIUM) 40 MG DR capsule Take 40 mg by mouth once daily  famotidine (PEPCID) 20 MG tablet Take 20 mg by mouth 2 (two) times daily  fluticasone propionate (FLONASE) 50 mcg/actuation nasal spray Place 2 sprays into both nostrils once daily  metoprolol succinate (TOPROL-XL) 25 MG XL tablet Take 12.5 mg by mouth once daily  rosuvastatin (CRESTOR) 20 MG tablet Take 20 mg by mouth once daily  traZODone (DESYREL) 50 MG tablet TAKE 1 TABLET BY MOUTH DAILY AT BEDTIME AS NEEDED  triamcinolone 0.025 % ointment APPLY TOPICALLY TO THE AFFECTED AREA TWICE DAILY FOR 14 DAYS   No current facility-administered medications on file prior to  visit.   History reviewed. No pertinent family history.   Social History   Tobacco Use  Smoking Status Never   Smokeless Tobacco Never    Social History   Socioeconomic History  Marital status: Married  Tobacco Use  Smoking status: Never  Smokeless tobacco: Never  Substance and Sexual Activity  Alcohol use: Never  Drug use: Never   Social Determinants of Health   Food Insecurity: No Food Insecurity (07/25/2022)  Received from [facility]  Hunger Vital Sign  Worried About Running Out of Food in the Last Year: Never true  Ran Out of Food in the Last Year: Never true  Transportation Needs: No Transportation Needs (07/25/2022)  Received from [facility] - [facility]  Lack of Transportation (Medical): No  Lack of Transportation (Non-Medical): No   Objective:   Vitals:  BP: (!) 152/80  Pulse: 70  Temp: 36.4 C (97.6 F)  SpO2: 99%  Weight: 70.4 kg (155 lb 3.2 oz)  Height: 172.7 cm (5\' 8" )  PainSc: 0-No pain  PainLoc: Neck   Body mass index is 23.6 kg/m.  Physical Exam   GENERAL APPEARANCE Comfortable, no acute issues Development: normal Gross deformities: none  SKIN Rash, lesions, ulcers: none Induration, erythema: none Nodules: none palpable  EYES Conjunctiva and lids: normal Pupils: equal and reactive  EARS, NOSE, MOUTH, THROAT External ears: no lesion or deformity External nose: no lesion or deformity Hearing: grossly normal  NECK Symmetric: no Trachea: midline Thyroid: There is a dominant soft rounded mass in the inferior right thyroid lobe which extends anteriorly and laterally. It measures at least 5 cm in size. There appears to be a smaller nodule in the superior pole on the right. Left side is without discrete or dominant mass but is nodular on palpation. There is no associated lymphadenopathy.  CHEST Respiratory effort: normal Retraction or accessory muscle use: no Breath sounds: normal bilaterally Rales, rhonchi, wheeze: none  CARDIOVASCULAR Auscultation: regular rhythm, normal rate Murmurs: none Pulses: radial pulse 2+  palpable Lower extremity edema: none  ABDOMEN Not assessed  GENITOURINARY/RECTAL Not assessed  MUSCULOSKELETAL Station and gait: normal Digits and nails: no clubbing or cyanosis Muscle strength: grossly normal all extremities Range of motion: grossly normal all extremities Deformity: none  LYMPHATIC Cervical: none palpable Supraclavicular: none palpable  PSYCHIATRIC Oriented to person, place, and time: yes Mood and affect: normal for situation Judgment and insight: appropriate for situation   Assessment and Plan:   Multiple thyroid nodules Enlarged thyroid Dysphagia, unspecified type   Patient presents on referral from his endocrinologist for surgical evaluation and recommendations regarding management of enlarging thyroid nodules with underlying compressive symptoms.  Patient provided with a copy of "The Thyroid Book: Medical and Surgical Treatment of Thyroid Problems", published by [employer], 16 pages. Book reviewed and explained to patient during visit today.  Today we reviewed his clinical history. We reviewed his ultrasound and his recent laboratory studies. We discussed his upcoming endoscopy and his results from his swallowing evaluation by speech-language pathology. We did discuss the potential for thyroid surgery. If the patient were to undergo surgery, I would recommend total thyroidectomy. We discussed the risk and benefits of this procedure. We discussed the size and location of the surgical incision. We discussed the hospital stay to be anticipated. We discussed the potential for recurrent laryngeal nerve injury and injury to parathyroid glands. We discussed the need for lifelong thyroid hormone replacement. The patient understands.  I would like to obtain a CT scan of the  neck to better evaluate the overall size of the thyroid and to see if there is any evidence of extrinsic compression of either the airway or the esophagus. We will arrange for the study in the near  future.  Patient will keep his appointment for upper endoscopy next month. He also hopes to be seen by ENT for possible Botox injection.  Patient will return in 6 weeks to review the results of his CT scan and to discuss results of his upper endoscopy. We will then again discussed the possibility of thyroid surgery. Patient and his wife are happy with this approach and will return for follow-up in 6 weeks.  Darnell Level MD [facility] Office: [phone]

## 2023-09-06 NOTE — Progress Notes (Unsigned)
Referring Physician:  Ashok Croon, MD [address] [place],  [place] [address]  Primary Physician:  Lorenda Ishihara, MD  History of Present Illness: 09/09/2023 Randy Merritt is here today with a chief complaint of swallowing difficulties.  He has had a significant workup including ENT, endocrine surgery, speech-language pathology evaluations.  He is known to have a previous anterior cervical discectomy and fusion.  Also has some anterior cervical osteophytes.  Most notably has a very large goiter which is being planned for resection.  States that he has had worsening trouble with swallowing but this is continuing to be an increasing burden on his life.  Feels like he swallowing a lump.  He does notice some intermittent jerking in his arms or legs.  States that he is not having significant neck pain.  Does feel like he is losing weight.  No new bowel or bladder issues.  No new sensory loss.  No new radiculopathy signs.  Review of Systems:  A 10 point review of systems is negative, except for the pertinent positives and negatives detailed in the HPI.  Past Medical History: Past Medical History:  Diagnosis Date   Acid reflux    Anxiety    Arthritis    Depression    " a little"   Goiter    High cholesterol    History of blood transfusion    Perforated bowel (HCC)    Seasonal allergies     Past Surgical History: Past Surgical History:  Procedure Laterality Date   ANTERIOR CERVICAL DECOMP/DISCECTOMY FUSION N/A 04/08/2013   Procedure: ANTERIOR CERVICAL DECOMPRESSION/DISCECTOMY FUSION 1 LEVEL;  Surgeon: Karn Cassis, MD;  Location: [facility];  Service: Neurosurgery;  Laterality: N/A;  Cervical five-six Anterior cervical decompression/diskectomy fusion   APPENDECTOMY N/A 10/06/2019   Procedure: APPENDECTOMY;  Surgeon: Berna Bue, MD;  Location: [facility] OR;  Service: General;  Laterality: N/A;   BIOPSY  01/30/2019   Procedure: BIOPSY;  Surgeon: Jeani Hawking, MD;  Location: [facility] ENDOSCOPY;  Service: Endoscopy;;   CERVICAL DISCECTOMY     x2   CHOLECYSTECTOMY     COLON RESECTION SIGMOID N/A 10/06/2019   Procedure: COLON RESECTION SIGMOID;  Surgeon: Berna Bue, MD;  Location: [facility] OR;  Service: General;  Laterality: N/A;   ESOPHAGEAL DILATION  01/30/2019   Procedure: ESOPHAGEAL DILATION;  Surgeon: Jeani Hawking, MD;  Location: [facility] ENDOSCOPY;  Service: Endoscopy;;  [place]   ESOPHAGOGASTRODUODENOSCOPY (EGD) WITH PROPOFOL N/A 01/30/2019   Procedure: ESOPHAGOGASTRODUODENOSCOPY (EGD) WITH PROPOFOL;  Surgeon: Jeani Hawking, MD;  Location: [facility] ENDOSCOPY;  Service: Endoscopy;  Laterality: N/A;   gallstone removal     LAPAROTOMY N/A 10/06/2019   Procedure: EXPLORATORY LAPAROTOMY;  Surgeon: Berna Bue, MD;  Location: [facility] OR;  Service: General;  Laterality: N/A;    Allergies: Allergies as of 09/09/2023 - Review Complete 08/15/2023  Allergen Reaction Noted   Alprazolam Other (See Comments) 10/24/2020   Bupropion hcl er (xl) Other (See Comments) 03/19/2023   Cyclobenzaprine Other (See Comments) 10/24/2020   Escitalopram Other (See Comments) 09/09/2023   Mirtazapine  09/09/2023   Prednisone Palpitations and Other (See Comments) 10/11/2020    Medications:  Current Outpatient Medications:    acetaminophen (TYLENOL) 500 MG tablet, Take 2 tablets (1,000 mg total) by mouth every 8 (eight) hours. (Patient taking differently: Take 1,000 mg by mouth every 8 (eight) hours as needed. pain), Disp: 30 tablet, Rfl: 0   ALPRAZolam (NIRAVAM) 0.5 MG dissolvable tablet, Take  0.5 mg by mouth at bedtime as needed for anxiety., Disp: , Rfl:    aspirin EC 81 MG tablet, Take 1 tablet (81 mg total) by mouth daily. Swallow whole., Disp: 30 tablet, Rfl: 0   citalopram (CELEXA) 10 MG tablet, Take 10 mg by mouth daily., Disp: , Rfl:    docusate sodium (COLACE) 100 MG capsule, Take 1 capsule (100 mg total) by mouth 2 (two) times daily., Disp: 10 capsule, Rfl: 0    famotidine (PEPCID) 20 MG tablet, Take 1 tablet (20 mg total) by mouth 2 (two) times daily., Disp: 30 tablet, Rfl: 1   feeding supplement (ENSURE ENLIVE / ENSURE PLUS) LIQD, Take 237 mLs by mouth 3 (three) times daily between meals., Disp: 10000 mL, Rfl: 0   finasteride (PROSCAR) 5 MG tablet, Take 5 mg by mouth daily., Disp: , Rfl:    fluticasone (FLONASE) 50 MCG/ACT nasal spray, Place 2 sprays into both nostrils daily., Disp: , Rfl:    ondansetron (ZOFRAN) 4 MG tablet, Take 1 tablet (4 mg total) by mouth every 6 (six) hours as needed for nausea., Disp: 20 tablet, Rfl: 0   pantoprazole (PROTONIX) 40 MG tablet, Take 1 tablet (40 mg total) by mouth 2 (two) times daily before a meal., Disp: 60 tablet, Rfl: 0   QUEtiapine (SEROQUEL) 25 MG tablet, Take 1 tablet (25 mg total) by mouth at bedtime., Disp: 30 tablet, Rfl: 0   Rosuvastatin Calcium 10 MG CPSP, Take 10 mg by mouth daily., Disp: , Rfl:    triamcinolone (KENALOG) 0.025 % ointment, Apply 1 Application topically., Disp: , Rfl:   Social History: Social History   Tobacco Use   Smoking status: Former    Current packs/day: 0.00    Average packs/day: 0.3 packs/day for 1 year (0.3 ttl pk-yrs)    Types: Cigarettes    Start date: 49    Quit date: 1960    Years since quitting: 64.8   Smokeless tobacco: Never  Vaping Use   Vaping status: Never Used  Substance Use Topics   Alcohol use: No   Drug use: No    Family Medical History: Family History  Problem Relation Age of Onset   Healthy Mother    Healthy Father    Hypertension Sister    Hypertension Sister    Diabetes Sister    Hypertension Brother    Thyroid disease Neg Hx     Physical Examination: Vitals:   09/09/23 1051  BP: (!) 134/92    General: Patient is in no apparent distress. Attention to examination is appropriate.  Neck:   Supple.  Full range of motion.  Respiratory: Patient is breathing without any difficulty.   NEUROLOGICAL:     Awake, alert, oriented to  person, place, and time.  Speech is clear and fluent.   Cranial Nerves: Pupils equal round and reactive to light.  Facial tone is symmetric.  Facial sensation is symmetric. Shoulder shrug is symmetric. Tongue protrusion is midline.    Strength: No new major deficits but noted in the bilateral upper or lower extremities.  He does have some wasting of his hands and overall wasting of his extremity musculature.  This likely tied to his recent inability to eat.  Hoffman's is absent. Clonus is absent  No major sensation changes     No evidence of dysmetria noted.  Gait is normal.    Imaging: Narrative & Impression  CLINICAL DATA:  82 year old male with dysphagia. Globus sensation. Prior ACDF. Thyromegaly.   EXAM:  CT NECK WITH CONTRAST   TECHNIQUE: Multidetector CT imaging of the neck was performed using the standard protocol following the bolus administration of intravenous contrast.   RADIATION DOSE REDUCTION: This exam was performed according to the departmental dose-optimization program which includes automated exposure control, adjustment of the mA and/or kV according to patient size and/or use of iterative reconstruction technique.   CONTRAST:  75mL OMNIPAQUE IOHEXOL 300 MG/ML  SOLN   COMPARISON:  Neck CT 07/08/2021.   Thyroid ultrasound most recently 10/17/2022.   FINDINGS: Pharynx and larynx: Motion artifact at the hypopharynx, supraglottic larynx. But laryngeal and pharyngeal soft tissue contours seem to remain stable and within normal limits. Negative parapharyngeal and retropharyngeal spaces.   Salivary glands: Stable and negative when allowing for some submandibular gland and sublingual motion artifact.   Thyroid: Bulky chronic thyroid goiter. Heterogeneously enlarged right lobe measures up to 50 by 45 x 86 mm (AP by transverse by CC) (estimated right lobe volume 97 mL. Lesser heterogeneous left thyroid lobe enlargement is 30 x 26 x 54 mm. The gland size  and configuration does not appear significantly changed since 2022, with mild retrosternal extension on the right as before. Mild chronic mass effect on the subglottic trachea. No significant airway narrowing. No adjacent soft tissue plane invasion.   Lymph nodes: Negative.  No cervical lymphadenopathy.   Vascular: Major vascular structures in the neck and at the skull base are patent. Mild lateral displacement of the carotid spaces due to thyroid enlargement. Tortuous ICAs below the skull base.   Limited intracranial: Negative.   Visualized orbits: Negative.   Mastoids and visualized paranasal sinuses: Visualized paranasal sinuses and mastoids are clear.   Skeleton: Appearance of chronic ACDF superimposed on congenital C3-C4 incomplete cervical spine segmentation. Bulky anterior cervical endplate osteophytes at the other levels suggesting Diffuse idiopathic skeletal hyperostosis (DISH). C5-C6 ACDF hardware. Solid ankylosis and/or arthrodesis C3-C4 her, C5-C6, C6-C7.   Superimposed bulky bilateral stylohyoid ligament calcification, greater on the right. No acute osseous abnormality identified.   Upper chest: Negative aside from the thyroid enlargement as described above.   IMPRESSION: 1. Bulky Chronic Thyroid Goiter without significant change since 2022. Right lobe volume alone estimated at 97 mL. Chronic mild regional mass effect. This has been evaluated on previous imaging. (ref: J Am Coll Radiol. 2015 Feb;12(2): 143-50).   2. 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 Multilevel bulky anterior endplate osteophytosis, multilevel cervical ankylosis/arthrodesis.   3. 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     Electronically Signed   By: Odessa Fleming M.D.   On: 05/08/2023 08:43    I have personally reviewed the images and agree with the above interpretation.  Medical  Decision Making/Assessment and Plan: Randy Merritt is a pleasant 82 y.o. male with swallowing difficulties.  He has had an extensive workup with endocrine surgery, swallowing evaluation with SLP, as well as ear nose and throat surgery.  He has a history of a anterior cervical discectomy and fusion.  He has no current symptoms of myelopathy or cervical radiculopathy.  He does have some diffuse muscle wasting which he states he has had approximately 30 pound weight loss recently given his inability to eat.  On physical exam he has no clear signs of a radiculopathy or cervical myelopathy.  He does have a large goiter.  On his imaging demonstrates a large goiter with displacement of his esophagus and trachea.  It appears that he is being  scheduled for surgical resection of this goiter.  He does have some osteophytes, however given the ventral compression from the goiter which is planned to be removed I think it would be best to reevaluate him after he has had the surgery to see whether or not that helps with his swallowing issues.  It is unlikely that these osteophytes will be an issue after that procedure given their anatomy.  Thank you for involving me in the care of this patient.    Lovenia Kim MD/MSCR Neurosurgery

## 2023-09-06 NOTE — Telephone Encounter (Signed)
Telephone call to patient - LMOM.  Reviewed ENT consultation.  Appreciate evaluation and recommendations.  Plan to proceed with total thyroidectomy.  Will enter orders and send to schedulers to contact patient.  Darnell Level, MD [facility] practice Office: [phone]

## 2023-09-09 ENCOUNTER — Ambulatory Visit: Payer: Medicare HMO | Admitting: Neurosurgery

## 2023-09-09 ENCOUNTER — Encounter: Payer: Self-pay | Admitting: Neurosurgery

## 2023-09-09 VITALS — BP 134/92 | Ht 69.0 in | Wt 145.0 lb

## 2023-09-09 DIAGNOSIS — E049 Nontoxic goiter, unspecified: Secondary | ICD-10-CM

## 2023-09-09 DIAGNOSIS — M2578 Osteophyte, vertebrae: Secondary | ICD-10-CM | POA: Insufficient documentation

## 2023-09-09 DIAGNOSIS — R1310 Dysphagia, unspecified: Secondary | ICD-10-CM

## 2023-09-09 DIAGNOSIS — Z981 Arthrodesis status: Secondary | ICD-10-CM

## 2023-09-10 DIAGNOSIS — Z008 Encounter for other general examination: Secondary | ICD-10-CM | POA: Diagnosis not present

## 2023-09-12 DIAGNOSIS — F331 Major depressive disorder, recurrent, moderate: Secondary | ICD-10-CM | POA: Diagnosis not present

## 2023-09-12 DIAGNOSIS — F5101 Primary insomnia: Secondary | ICD-10-CM | POA: Diagnosis not present

## 2023-09-12 DIAGNOSIS — F411 Generalized anxiety disorder: Secondary | ICD-10-CM | POA: Diagnosis not present

## 2023-09-16 NOTE — Progress Notes (Signed)
Anesthesia Review:  PCP: Cardiologist : Chest x-ray : 06/22/23- 2 view  EKG : 07/08/23  Echo : 03/20/22  Stress test: Cardiac Cath :  Activity level:  Sleep Study/ CPAP : Fasting Blood Sugar :      / Checks Blood Sugar -- times a day:   Blood Thinner/ Instructions /Last Dose: ASA / Instructions/ Last Dose :    81 mg aspirin

## 2023-09-17 ENCOUNTER — Encounter: Payer: Self-pay | Admitting: Surgery

## 2023-09-17 NOTE — Patient Instructions (Signed)
SURGICAL WAITING ROOM VISITATION  Patients having surgery or a procedure may have no more than 2 support people in the waiting area - these visitors may rotate.    Children under the age of 42 must have an adult with them who is not the patient.  Due to an increase in RSV and influenza rates and associated hospitalizations, children ages 63 and under may not visit patients in [facility] hospitals.  If the patient needs to stay at the hospital during part of their recovery, the visitor guidelines for inpatient rooms apply. Pre-op nurse will coordinate an appropriate time for 1 support person to accompany patient in pre-op.  This support person may not rotate.    Please refer to the [facility] website for the visitor guidelines for Inpatients (after your surgery is over and you are in a regular room).       Your procedure is scheduled on: 09/27/2023    Report to [facility] Main Entrance    Report to admitting at  0515 AM   Call this number if you have problems the morning of surgery [phone]   Do not eat food :After Midnight.   After Midnight you may have the following liquids until _ 0430_____ AM DAY OF SURGERY  Water Non-Citrus Juices (without pulp, NO RED-Apple, White grape, White cranberry) Black Coffee (NO MILK/CREAM OR CREAMERS, sugar ok)  Clear Tea (NO MILK/CREAM OR CREAMERS, sugar ok) regular and decaf                             Plain Jell-O (NO RED)                                           Fruit ices (not with fruit pulp, NO RED)                                     Popsicles (NO RED)                                                               Sports drinks like Gatorade (NO RED)                    The day of surgery:  Drink ONE (1) Pre-Surgery Clear Ensure or G2 at  0430 AM ( have completed by )  the morning of surgery. Drink in one sitting. Do not sip.  This drink was given to you during your hospital  pre-op appointment visit. Nothing else to  drink after completing the  Pre-Surgery Clear Ensure or G2.          If you have questions, please contact your surgeon's office.       Oral Hygiene is also important to reduce your risk of infection.                                    Remember - BRUSH YOUR TEETH THE MORNING OF SURGERY WITH YOUR  REGULAR TOOTHPASTE  DENTURES WILL BE REMOVED PRIOR TO SURGERY PLEASE DO NOT APPLY "Poly grip" OR ADHESIVES!!!   Do NOT smoke after Midnight   Stop all vitamins and herbal supplements 7 days before surgery.   Take these medicines the morning of surgery with A SIP OF WATER:  celexa, pepcid, proscar, flonase if needed, protonix, eye drops   DO NOT TAKE ANY ORAL DIABETIC MEDICATIONS DAY OF YOUR SURGERY  Bring CPAP mask and tubing day of surgery.                              You may not have any metal on your body including hair pins, jewelry, and body piercing             Do not wear make-up, lotions, powders, perfumes/cologne, or deodorant  Do not wear nail polish including gel and S&S, artificial/acrylic nails, or any other type of covering on natural nails including finger and toenails. If you have artificial nails, gel coating, etc. that needs to be removed by a nail salon please have this removed prior to surgery or surgery may need to be canceled/ delayed if the surgeon/ anesthesia feels like they are unable to be safely monitored.   Do not shave  48 hours prior to surgery.               Men may shave face and neck.   Do not bring valuables to the hospital. [facility] IS NOT             RESPONSIBLE   FOR VALUABLES.   Contacts, glasses, dentures or bridgework may not be worn into surgery.   Bring small overnight bag day of surgery.   DO NOT BRING YOUR HOME MEDICATIONS TO THE HOSPITAL. PHARMACY WILL DISPENSE MEDICATIONS LISTED ON YOUR MEDICATION LIST TO YOU DURING YOUR ADMISSION IN THE HOSPITAL!    Patients discharged on the day of surgery will not be allowed to drive home.   Someone NEEDS to stay with you for the first 24 hours after anesthesia.   Special Instructions: Bring a copy of your healthcare power of attorney and living will documents the day of surgery if you haven't scanned them before.              Please read over the following fact sheets you were given: IF YOU HAVE QUESTIONS ABOUT YOUR PRE-OP INSTRUCTIONS PLEASE CALL [phone]   If you received a COVID test during your pre-op visit  it is requested that you wear a mask when out in public, stay away from anyone that may not be feeling well and notify your surgeon if you develop symptoms. If you test positive for Covid or have been in contact with anyone that has tested positive in the last 10 days please notify you surgeon.    [facility] - Preparing for Surgery Before surgery, you can play an important role.  Because skin is not sterile, your skin needs to be as free of germs as possible.  You can reduce the number of germs on your skin by washing with CHG (chlorahexidine gluconate) soap before surgery.  CHG is an antiseptic cleaner which kills germs and bonds with the skin to continue killing germs even after washing. Please DO NOT use if you have an allergy to CHG or antibacterial soaps.  If your skin becomes reddened/irritated stop using the CHG and inform your nurse when you arrive at Short  Stay. Do not shave (including legs and underarms) for at least 48 hours prior to the first CHG shower.  You may shave your face/neck. Please follow these instructions carefully:  1.  Shower with CHG Soap the night before surgery and the  morning of Surgery.  2.  If you choose to wash your hair, wash your hair first as usual with your  normal  shampoo.  3.  After you shampoo, rinse your hair and body thoroughly to remove the  shampoo.                           4.  Use CHG as you would any other liquid soap.  You can apply chg directly  to the skin and wash                       Gently with a scrungie or clean  washcloth.  5.  Apply the CHG Soap to your body ONLY FROM THE NECK DOWN.   Do not use on face/ open                           Wound or open sores. Avoid contact with eyes, ears mouth and genitals (private parts).                       Wash face,  Genitals (private parts) with your normal soap.             6.  Wash thoroughly, paying special attention to the area where your surgery  will be performed.  7.  Thoroughly rinse your body with warm water from the neck down.  8.  DO NOT shower/wash with your normal soap after using and rinsing off  the CHG Soap.                9.  Pat yourself dry with a clean towel.            10.  Wear clean pajamas.            11.  Place clean sheets on your bed the night of your first shower and do not  sleep with pets. Day of Surgery : Do not apply any lotions/deodorants the morning of surgery.  Please wear clean clothes to the hospital/surgery center.  FAILURE TO FOLLOW THESE INSTRUCTIONS MAY RESULT IN THE CANCELLATION OF YOUR SURGERY PATIENT SIGNATURE_________________________________  NURSE SIGNATURE__________________________________  ________________________________________________________________________

## 2023-09-17 NOTE — H&P (Signed)
PROVIDER: Carolin Quang Myra Rude, MD   Chief Complaint: Follow-up (Multinodular thyroid goiter)  History of Present Illness:  Patient returns to my practice for follow-up. He was seen in May 2020 for regarding a multinodular thyroid goiter. At my request he underwent a CT scan of the neck on June 03, 2023. This demonstrates a stable multinodular thyroid goiter with a dominant mass in the right thyroid lobe measuring 6.5 cm and a dominant nodule in the left thyroid lobe measuring 3.9 cm. There is a focus of calcification on the right side. There is leftward deviation of the trachea with mild narrowing. Patient has also been evaluated in the interim by ENT. He has been referred to a different ENT practice for consideration of Botox injections, presumably for management of his underlying dysphagia. He has also undergone an upper endoscopy by gastroenterology. Recent laboratory studies show elevated thyroid hormone levels with a suppressed TSH level of 0.179. Patient presents today accompanied by his wife to discuss these results and to make plans for further management.   Review of Systems: A complete review of systems was obtained from the patient. I have reviewed this information and discussed as appropriate with the patient. See HPI as well for other ROS.  Review of Systems  Constitutional: Positive for weight loss.  HENT: Negative.  Eyes: Negative.  Respiratory: Negative.  Cardiovascular: Negative.  Gastrointestinal:  Dysphagia  Genitourinary: Negative.  Musculoskeletal: Negative.  Skin: Negative.  Neurological: Negative.  Endo/Heme/Allergies: Negative.  Psychiatric/Behavioral: Negative.    Medical History: Past Medical History:  Diagnosis Date  Anxiety  GERD (gastroesophageal reflux disease)  Thyroid disease   Patient Active Problem List  Diagnosis  Dysphagia  Enlarged thyroid  Multiple thyroid nodules  Tracheal deviation   Past Surgical History:  Procedure  Laterality Date  Neck surgery    Allergies  Allergen Reactions  Alprazolam Other (See Comments)  Cyclobenzaprine Other (See Comments)  Lorazepam Other (See Comments)  Prednisone Other (See Comments) and Palpitations   Current Outpatient Medications on File Prior to Visit  Medication Sig Dispense Refill  cetirizine (ZYRTEC) 10 MG tablet Take 10 mg by mouth once daily  clonazePAM (KLONOPIN) 0.5 MG tablet TAKE 1/2 TABLET BY MOUTH EVERY 12 HOURS AS NEEDED FOR ANXIETY (Patient not taking: Reported on 07/16/2023)  esomeprazole (NEXIUM) 40 MG DR capsule Take 40 mg by mouth once daily  famotidine (PEPCID) 20 MG tablet Take 20 mg by mouth 2 (two) times daily  fluticasone propionate (FLONASE) 50 mcg/actuation nasal spray Place 2 sprays into both nostrils once daily  metoprolol succinate (TOPROL-XL) 25 MG XL tablet Take 12.5 mg by mouth once daily (Patient not taking: Reported on 07/16/2023)  rosuvastatin (CRESTOR) 20 MG tablet Take 20 mg by mouth once daily  traZODone (DESYREL) 50 MG tablet TAKE 1 TABLET BY MOUTH DAILY AT BEDTIME AS NEEDED (Patient not taking: Reported on 07/16/2023)  triamcinolone 0.025 % ointment APPLY TOPICALLY TO THE AFFECTED AREA TWICE DAILY FOR 14 DAYS (Patient not taking: Reported on 07/16/2023)   No current facility-administered medications on file prior to visit.   History reviewed. No pertinent family history.   Social History   Tobacco Use  Smoking Status Never  Smokeless Tobacco Never    Social History   Socioeconomic History  Marital status: Married  Tobacco Use  Smoking status: Never  Smokeless tobacco: Never  Substance and Sexual Activity  Alcohol use: Never  Drug use: Never   Social Determinants of Health   Food Insecurity: Low Risk (  07/03/2023)  Received from [facility]  Hunger Vital Sign  Worried About Running Out of Food in the Last Year: Never true  Ran Out of Food in the Last Year: Never true  Housing Stability: Low Risk (07/03/2023)  Received  from [facility] Stability Vital Sign  What is your living situation today?: I have a steady place to live  Think about the place you live. Do you have problems with any of the following? Choose all that apply:: None/None on this list   Objective:   Vitals:  PainSc: 0-No pain   There is no height or weight on file to calculate BMI.  Physical Exam   GENERAL APPEARANCE Comfortable, no acute issues Development: normal Gross deformities: none  SKIN Rash, lesions, ulcers: none Induration, erythema: none Nodules: none palpable  EYES Conjunctiva and lids: normal Pupils: equal and reactive  EARS, NOSE, MOUTH, THROAT External ears: no lesion or deformity External nose: no lesion or deformity Hearing: grossly normal  NECK Symmetric: no Trachea: midline Thyroid: Gland is asymmetric, much larger on the right side than the left, with deviation of the larynx to the left. Gland is quite firm and nodular without dominant mass palpable. There is no associated lymphadenopathy. There is no tenderness.  ABDOMEN Not assessed  GENITOURINARY/RECTAL Not assessed  MUSCULOSKELETAL Station and gait: normal Digits and nails: no clubbing or cyanosis Muscle strength: grossly normal all extremities Range of motion: grossly normal all extremities Deformity: none  LYMPHATIC Cervical: none palpable Supraclavicular: none palpable  PSYCHIATRIC Oriented to person, place, and time: yes Mood and affect: normal for situation Judgment and insight: appropriate for situation   Assessment and Plan:   Multiple thyroid nodules Enlarged thyroid Tracheal deviation  Patient returns for follow-up. 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 Patient also has mild hyperthyroidism based on recent laboratory studies.  Patient continues under evaluation for dysphagia. He has been previously seen by speech-language pathology.  He has had a recent upper endoscopy. He is scheduled for a second ENT consultation in October for possible Botox therapy.  Today we discussed the fact that he does need to undergo thyroidectomy for management of his multinodular thyroid goiter with compressive symptoms, tracheal deviation, and borderline hyperthyroidism. However, I would like to allow him to complete his evaluation by ENT prior to scheduling any surgical procedure. Therefore the patient will plan to keep his consult appointment in October. They will contact me once this evaluation is completed and I will review the recommendations from ENT. We will make a decision at that time regarding timing of thyroid surgery.  Patient and his wife are happy with this plan. We will be in touch following his consultation with ENT in October.   Darnell Level, MD [facility] Surgery A [facility] practice Office: [phone]

## 2023-09-18 ENCOUNTER — Encounter (HOSPITAL_COMMUNITY): Payer: Self-pay

## 2023-09-18 ENCOUNTER — Other Ambulatory Visit: Payer: Self-pay

## 2023-09-18 ENCOUNTER — Encounter (HOSPITAL_COMMUNITY)
Admission: RE | Admit: 2023-09-18 | Discharge: 2023-09-18 | Disposition: A | Discharge status: Home / Self Care | Payer: Medicare HMO | Source: Ambulatory Visit | Attending: Surgery | Admitting: Surgery

## 2023-09-18 VITALS — BP 138/75 | HR 79 | Temp 98.2°F | Resp 16 | Ht 69.0 in | Wt 143.0 lb

## 2023-09-18 DIAGNOSIS — Z01812 Encounter for preprocedural laboratory examination: Secondary | ICD-10-CM | POA: Insufficient documentation

## 2023-09-18 DIAGNOSIS — Z01818 Encounter for other preprocedural examination: Secondary | ICD-10-CM

## 2023-09-18 DIAGNOSIS — K219 Gastro-esophageal reflux disease without esophagitis: Secondary | ICD-10-CM | POA: Diagnosis not present

## 2023-09-18 DIAGNOSIS — F0390 Unspecified dementia without behavioral disturbance: Secondary | ICD-10-CM | POA: Insufficient documentation

## 2023-09-18 DIAGNOSIS — F419 Anxiety disorder, unspecified: Secondary | ICD-10-CM | POA: Insufficient documentation

## 2023-09-18 DIAGNOSIS — Z8673 Personal history of transient ischemic attack (TIA), and cerebral infarction without residual deficits: Secondary | ICD-10-CM | POA: Diagnosis not present

## 2023-09-18 DIAGNOSIS — F32A Depression, unspecified: Secondary | ICD-10-CM | POA: Diagnosis not present

## 2023-09-18 DIAGNOSIS — Z87891 Personal history of nicotine dependence: Secondary | ICD-10-CM | POA: Diagnosis not present

## 2023-09-18 HISTORY — DX: Personal history of urinary calculi: Z87.442

## 2023-09-18 HISTORY — DX: Cerebral infarction, unspecified: I63.9

## 2023-09-18 LAB — CBC
HCT: 36.7 % — ABNORMAL LOW (ref 39.0–52.0)
Hemoglobin: 11.9 g/dL — ABNORMAL LOW (ref 13.0–17.0)
MCH: 31.9 pg (ref 26.0–34.0)
MCHC: 32.4 g/dL (ref 30.0–36.0)
MCV: 98.4 fL (ref 80.0–100.0)
Platelets: 158 10*3/uL (ref 150–400)
RBC: 3.73 MIL/uL — ABNORMAL LOW (ref 4.22–5.81)
RDW: 13.3 % (ref 11.5–15.5)
WBC: 2.7 10*3/uL — ABNORMAL LOW (ref 4.0–10.5)
nRBC: 0 % (ref 0.0–0.2)

## 2023-09-20 ENCOUNTER — Encounter (HOSPITAL_COMMUNITY): Payer: Self-pay

## 2023-09-20 NOTE — Anesthesia Preprocedure Evaluation (Addendum)
Anesthesia Evaluation  Patient identified by MRN, date of birth, ID band Patient awake    Reviewed: Allergy & Precautions, H&P , NPO status , Patient's Chart, lab work & pertinent test results  Airway Mallampati: IV  TM Distance: >3 FB Neck ROM: Full    Dental no notable dental hx. (+) Missing, Poor Dentition, Chipped,    Pulmonary former smoker CT: large right thyroid nodule measuring up 2 4.7 cm AP x 4.6 cm TV x 6.5 cm cc and left thyroid nodule measuring 2.8 cm x 1.6 cm x 3.9 cm are not significantly changed, with foci of calcification on the right. There is unchanged mass effect with leftward deviation and mild narrowing of the airway. These nodules has been previously evaluated by ultrasound and biopsy.    Pulmonary exam normal breath sounds clear to auscultation       Cardiovascular hypertension (164/78 preop, no home meds), Normal cardiovascular exam Rhythm:Regular Rate:Normal     Neuro/Psych  PSYCHIATRIC DISORDERS Anxiety Depression    CVA, No Residual Symptoms    GI/Hepatic Neg liver ROS,GERD  Controlled,,  Endo/Other  Thyroid goiter   Renal/GU Renal InsufficiencyRenal diseaseCr 1.58  negative genitourinary   Musculoskeletal  (+) Arthritis , Osteoarthritis,    Abdominal   Peds negative pediatric ROS (+)  Hematology  (+) Blood dyscrasia, anemia Hb 11.9   Anesthesia Other Findings   Reproductive/Obstetrics negative OB ROS                             Anesthesia Physical Anesthesia Plan  ASA: 3  Anesthesia Plan: General   Post-op Pain Management: Tylenol PO (pre-op)*   Induction: Intravenous and Rapid sequence  PONV Risk Score and Plan: 3 and Ondansetron, Dexamethasone and Treatment may vary due to age or medical condition  Airway Management Planned: Oral ETT and Video Laryngoscope Planned  Additional Equipment: None  Intra-op Plan:   Post-operative Plan: Extubation  in OR  Informed Consent: I have reviewed the patients History and Physical, chart, labs and discussed the procedure including the risks, benefits and alternatives for the proposed anesthesia with the patient or authorized representative who has indicated his/her understanding and acceptance.     Dental advisory given  Plan Discussed with: CRNA  Anesthesia Plan Comments:         Anesthesia Quick Evaluation

## 2023-09-20 NOTE — Progress Notes (Signed)
Case: [id number] Date/Time: 09/27/23 0715   Procedure: TOTAL THYROIDECTOMY   Anesthesia type: General   Pre-op diagnosis: THYROID GOITER WITH TRACHEAL DEVIATION   [facility] ROOM 01 / [facility]   Surgeons: Darnell Level, MD       DISCUSSION: Randy Merritt is an 82 yo male who presents to PAT prior to surgery above. 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  Patient with longstanding goiter and dysphagia with associated unintentional weight loss. Has had extensive prior w/u with ENT, NSU, SLP with no clear explanation for symptoms. Now scheduled for thyroidectomy to see if that improves his symptoms.   Patient had a consult with Cardiology in 03/2022 due to chest pain. He underwent a stress test which was low risk. Echo showed normal EF with mild valvular abnormalities.  Patient is poor historian. Wife helps with history. He is followed by neuropsych.  VS: BP 138/75   Pulse 79   Temp 36.8 C (Oral)   Resp 16   Ht 5\' 9"  (1.753 m)   Wt 64.9 kg   SpO2 100%   BMI 21.12 kg/m   PROVIDERS: Lorenda Ishihara, MD   LABS: Labs reviewed: Acceptable for surgery. 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 Unchanged large right and smaller left thyroid nodules with regional mass effect resulting in mild leftward displacement and narrowing of the infraglottic airway. These nodules has been previously evaluated by ultrasound and biopsy. 2. No new or acute finding in the neck.     EKG:   CV:  Lexiscan (Walking with mod Bruce)Tetrofosmin Stress Test 03/26/2022:  1 Day  Rest/Stress Protocol. Exercise time 4 minutes, achieved 2.28 METS, 71% of age-predicted maximum heart rate. Stress ECG nondiagnostic due to submaximal workload. Normal myocardial perfusion without evidence of reversible myocardial ischemia or prior infarct. Calculated LVEF 61%, left ventricular size and wall motion preserved. Low risk study . Echocardiogram 03/20/2022:  Left ventricle cavity is normal in size and wall thickness. Normal global  wall motion. Normal LV systolic function with EF 56%. Doppler evidence of  grade I (impaired) diastolic dysfunction, normal LAP.  Structurally normal trileaflet aortic valve.  Mild (Grade I) aortic  regurgitation.  Mild to moderate mitral regurgitation.  Moderate tricuspid regurgitation.  Mild pulmonic regurgitation.  No evidence of pulmonary hypertension.   Past Medical History:  Diagnosis Date   Acid reflux    Anxiety    Arthritis    Depression    " a little"   Goiter    High cholesterol    History of blood transfusion    History of kidney stones    Perforated bowel (HCC)    Seasonal allergies    Stroke [facility])    showed on Scan per wife no symptoms    Past Surgical History:  Procedure Laterality Date   ANTERIOR CERVICAL DECOMP/DISCECTOMY FUSION N/A 04/08/2013   Procedure: ANTERIOR CERVICAL DECOMPRESSION/DISCECTOMY FUSION 1 LEVEL;  Surgeon: Karn Cassis, MD;  Location: [facility];  Service: Neurosurgery;  Laterality: N/A;  Cervical five-six Anterior cervical decompression/diskectomy fusion   APPENDECTOMY N/A 10/06/2019   Procedure: APPENDECTOMY;  Surgeon: Berna Bue, MD;  [facility] OR;  Service: General;  Laterality: N/A;   BIOPSY  01/30/2019   Procedure: BIOPSY;  Surgeon: Jeani Hawking, MD;  Location: [facility];  Service: Endoscopy;;   CERVICAL DISCECTOMY     x2   CHOLECYSTECTOMY     COLON RESECTION SIGMOID N/A 10/06/2019   Procedure: COLON RESECTION SIGMOID;  Surgeon: Berna Bue, MD;  Location: [facility] OR;   Service: General;  Laterality: N/A;   ESOPHAGEAL DILATION  01/30/2019   Procedure: ESOPHAGEAL DILATION;  Surgeon: Jeani Hawking, MD;  Location: [facility];  Service: Endoscopy;;  [place]   ESOPHAGOGASTRODUODENOSCOPY (EGD) WITH PROPOFOL N/A 01/30/2019   Procedure: ESOPHAGOGASTRODUODENOSCOPY (EGD) WITH PROPOFOL;  Surgeon: Jeani Hawking, MD;  [facility];  Service: Endoscopy;  Laterality: N/A;   gallstone removal     LAPAROTOMY N/A 10/06/2019   Procedure: EXPLORATORY LAPAROTOMY;  Surgeon: Berna Bue, MD;  Location: [facility] OR;  Service: General;  Laterality: N/A;    MEDICATIONS:  acetaminophen (TYLENOL) 500 MG tablet   ALPRAZolam (XANAX) 0.25 MG tablet   aspirin EC 81 MG tablet   citalopram (CELEXA) 20 MG tablet   docusate sodium (COLACE) 100 MG capsule   famotidine (PEPCID) 20 MG tablet   feeding supplement (ENSURE ENLIVE / ENSURE PLUS) LIQD   finasteride (PROSCAR) 5 MG tablet   fluticasone (FLONASE) 50 MCG/ACT nasal spray   ondansetron (ZOFRAN) 4 MG tablet   pantoprazole (PROTONIX) 40 MG tablet   QUEtiapine (SEROQUEL) 25 MG tablet   rosuvastatin (CRESTOR) 20 MG tablet   timolol (TIMOPTIC) 0.5 % ophthalmic solution   No current facility-administered medications for this encounter.   Marcille Blanco [facility] Short Stay/Anesthesiology [facility] Phone [phone] 09/20/2023 9:16 AM

## 2023-09-26 ENCOUNTER — Encounter (HOSPITAL_COMMUNITY): Payer: Self-pay | Admitting: Surgery

## 2023-09-27 ENCOUNTER — Ambulatory Visit (HOSPITAL_COMMUNITY)
Admission: RE | Admit: 2023-09-27 | Discharge: 2023-09-28 | Disposition: A | Discharge status: Home / Self Care | Payer: Medicare HMO | Source: Ambulatory Visit | Attending: Surgery | Admitting: Surgery

## 2023-09-27 ENCOUNTER — Encounter (HOSPITAL_COMMUNITY)
Admission: RE | Disposition: A | Discharge status: Home / Self Care | Payer: Self-pay | Source: Ambulatory Visit | Attending: Surgery

## 2023-09-27 ENCOUNTER — Ambulatory Visit (HOSPITAL_COMMUNITY): Payer: Medicare HMO | Admitting: Physician Assistant

## 2023-09-27 ENCOUNTER — Encounter (HOSPITAL_COMMUNITY): Payer: Self-pay | Admitting: Surgery

## 2023-09-27 ENCOUNTER — Ambulatory Visit (HOSPITAL_BASED_OUTPATIENT_CLINIC_OR_DEPARTMENT_OTHER): Payer: Medicare HMO | Admitting: Anesthesiology

## 2023-09-27 ENCOUNTER — Other Ambulatory Visit (HOSPITAL_COMMUNITY): Payer: Self-pay

## 2023-09-27 ENCOUNTER — Other Ambulatory Visit: Payer: Self-pay

## 2023-09-27 DIAGNOSIS — E0590 Thyrotoxicosis, unspecified without thyrotoxic crisis or storm: Secondary | ICD-10-CM | POA: Diagnosis not present

## 2023-09-27 DIAGNOSIS — K219 Gastro-esophageal reflux disease without esophagitis: Secondary | ICD-10-CM | POA: Diagnosis not present

## 2023-09-27 DIAGNOSIS — E042 Nontoxic multinodular goiter: Secondary | ICD-10-CM | POA: Diagnosis present

## 2023-09-27 DIAGNOSIS — F418 Other specified anxiety disorders: Secondary | ICD-10-CM | POA: Insufficient documentation

## 2023-09-27 DIAGNOSIS — E041 Nontoxic single thyroid nodule: Secondary | ICD-10-CM

## 2023-09-27 DIAGNOSIS — Z79891 Long term (current) use of opiate analgesic: Secondary | ICD-10-CM | POA: Diagnosis not present

## 2023-09-27 DIAGNOSIS — Z01818 Encounter for other preprocedural examination: Secondary | ICD-10-CM

## 2023-09-27 DIAGNOSIS — I10 Essential (primary) hypertension: Secondary | ICD-10-CM | POA: Insufficient documentation

## 2023-09-27 DIAGNOSIS — J398 Other specified diseases of upper respiratory tract: Secondary | ICD-10-CM | POA: Diagnosis present

## 2023-09-27 HISTORY — DX: Other amnesia: R41.3

## 2023-09-27 HISTORY — PX: THYROIDECTOMY: SHX17

## 2023-09-27 SURGERY — THYROIDECTOMY
Anesthesia: General

## 2023-09-27 MED ORDER — FENTANYL CITRATE PF 50 MCG/ML IJ SOSY
PREFILLED_SYRINGE | INTRAMUSCULAR | Status: AC
  Filled 2023-09-27: qty 1

## 2023-09-27 MED ORDER — FENTANYL CITRATE PF 50 MCG/ML IJ SOSY
25.0000 ug | PREFILLED_SYRINGE | INTRAMUSCULAR | Status: DC | PRN
  Administered 2023-09-27 (×4): 25 ug via INTRAVENOUS

## 2023-09-27 MED ORDER — LEVOTHYROXINE SODIUM 100 MCG PO TABS
100.0000 ug | ORAL_TABLET | Freq: Every day | ORAL | 2 refills | Status: AC
  Filled 2023-09-27 (×2): qty 30, 30d supply, fill #0
  Filled 2023-10-29: qty 30, 30d supply, fill #1
  Filled 2023-11-29: qty 30, 30d supply, fill #2

## 2023-09-27 MED ORDER — DEXAMETHASONE SODIUM PHOSPHATE 10 MG/ML IJ SOLN
INTRAMUSCULAR | Status: AC
  Filled 2023-09-27: qty 2

## 2023-09-27 MED ORDER — SUCCINYLCHOLINE CHLORIDE 200 MG/10ML IV SOSY
PREFILLED_SYRINGE | INTRAVENOUS | Status: DC | PRN
  Administered 2023-09-27: 100 mg via INTRAVENOUS

## 2023-09-27 MED ORDER — AMISULPRIDE (ANTIEMETIC) 5 MG/2ML IV SOLN: 10.0000 mg | Freq: Once | INTRAVENOUS | Status: DC | PRN

## 2023-09-27 MED ORDER — ESMOLOL HCL 100 MG/10ML IV SOLN
INTRAVENOUS | Status: DC | PRN
  Administered 2023-09-27 (×2): 20 mg via INTRAVENOUS

## 2023-09-27 MED ORDER — DEXTROSE-SODIUM CHLORIDE 5-0.9 % IV SOLN: INTRAVENOUS | Status: DC

## 2023-09-27 MED ORDER — CALCIUM CARBONATE ANTACID 500 MG PO CHEW
2.0000 | CHEWABLE_TABLET | Freq: Three times a day (TID) | ORAL | 1 refills | Status: AC
  Filled 2023-09-27: qty 120, 20d supply, fill #0

## 2023-09-27 MED ORDER — HYDRALAZINE HCL 20 MG/ML IJ SOLN
INTRAMUSCULAR | Status: DC | PRN
  Administered 2023-09-27: 5 mg via INTRAVENOUS

## 2023-09-27 MED ORDER — CITALOPRAM HYDROBROMIDE 20 MG PO TABS
10.0000 mg | ORAL_TABLET | Freq: Every day | ORAL | Status: DC
  Administered 2023-09-28: 10 mg via ORAL
  Filled 2023-09-27: qty 1

## 2023-09-27 MED ORDER — PROPOFOL 10 MG/ML IV BOLUS
INTRAVENOUS | Status: DC | PRN
  Administered 2023-09-27: 150 mg via INTRAVENOUS
  Administered 2023-09-27: 50 mg via INTRAVENOUS

## 2023-09-27 MED ORDER — ROCURONIUM BROMIDE 10 MG/ML (PF) SYRINGE
PREFILLED_SYRINGE | INTRAVENOUS | Status: DC | PRN
  Administered 2023-09-27: 10 mg via INTRAVENOUS
  Administered 2023-09-27: 50 mg via INTRAVENOUS

## 2023-09-27 MED ORDER — FENTANYL CITRATE (PF) 100 MCG/2ML IJ SOLN
INTRAMUSCULAR | Status: DC | PRN
  Administered 2023-09-27 (×2): 50 ug via INTRAVENOUS

## 2023-09-27 MED ORDER — OXYCODONE HCL 5 MG/5ML PO SOLN
5.0000 mg | Freq: Once | ORAL | Status: AC | PRN
  Administered 2023-09-27: 5 mg via ORAL

## 2023-09-27 MED ORDER — ALPRAZOLAM 0.25 MG PO TABS
0.2500 mg | ORAL_TABLET | Freq: Two times a day (BID) | ORAL | Status: DC
  Administered 2023-09-27 – 2023-09-28 (×3): 0.25 mg via ORAL
  Filled 2023-09-27 (×3): qty 1

## 2023-09-27 MED ORDER — ACETAMINOPHEN 650 MG RE SUPP: 650.0000 mg | Freq: Four times a day (QID) | RECTAL | Status: DC | PRN

## 2023-09-27 MED ORDER — ROCURONIUM BROMIDE 10 MG/ML (PF) SYRINGE
PREFILLED_SYRINGE | INTRAVENOUS | Status: AC
  Filled 2023-09-27: qty 20

## 2023-09-27 MED ORDER — ONDANSETRON HCL 4 MG/2ML IJ SOLN: 4.0000 mg | Freq: Four times a day (QID) | INTRAMUSCULAR | Status: DC | PRN

## 2023-09-27 MED ORDER — OXYCODONE HCL 5 MG PO TABS
5.0000 mg | ORAL_TABLET | Freq: Once | ORAL | Status: AC | PRN
Start: 2023-09-27 — End: 2023-09-27

## 2023-09-27 MED ORDER — DEXAMETHASONE SODIUM PHOSPHATE 10 MG/ML IJ SOLN
INTRAMUSCULAR | Status: DC | PRN
  Administered 2023-09-27: 6 mg via INTRAVENOUS
  Administered 2023-09-27: 4 mg via INTRAVENOUS

## 2023-09-27 MED ORDER — DEXMEDETOMIDINE HCL IN NACL 80 MCG/20ML IV SOLN
INTRAVENOUS | Status: DC | PRN
  Administered 2023-09-27: 12 ug via INTRAVENOUS

## 2023-09-27 MED ORDER — OXYCODONE HCL 5 MG/5ML PO SOLN
ORAL | Status: AC
  Filled 2023-09-27: qty 5

## 2023-09-27 MED ORDER — CHLORHEXIDINE GLUCONATE 0.12 % MT SOLN
15.0000 mL | Freq: Once | OROMUCOSAL | Status: AC
  Administered 2023-09-27: 15 mL via OROMUCOSAL

## 2023-09-27 MED ORDER — PANTOPRAZOLE SODIUM 40 MG PO TBEC
40.0000 mg | DELAYED_RELEASE_TABLET | Freq: Two times a day (BID) | ORAL | Status: DC
  Administered 2023-09-27 – 2023-09-28 (×2): 40 mg via ORAL
  Filled 2023-09-27 (×2): qty 1

## 2023-09-27 MED ORDER — CHLORHEXIDINE GLUCONATE CLOTH 2 % EX PADS: 6.0000 | MEDICATED_PAD | Freq: Once | CUTANEOUS | Status: DC

## 2023-09-27 MED ORDER — ONDANSETRON HCL 4 MG/2ML IJ SOLN
INTRAMUSCULAR | Status: DC | PRN
  Administered 2023-09-27: 4 mg via INTRAVENOUS

## 2023-09-27 MED ORDER — LIDOCAINE HCL (PF) 2 % IJ SOLN
INTRAMUSCULAR | Status: AC
  Filled 2023-09-27: qty 10

## 2023-09-27 MED ORDER — ACETAMINOPHEN 500 MG PO TABS
1000.0000 mg | ORAL_TABLET | Freq: Once | ORAL | Status: AC
  Administered 2023-09-27: 1000 mg via ORAL
  Filled 2023-09-27: qty 2

## 2023-09-27 MED ORDER — ONDANSETRON HCL 4 MG/2ML IJ SOLN
INTRAMUSCULAR | Status: AC
Start: 2023-09-27 — End: ?
  Filled 2023-09-27: qty 4

## 2023-09-27 MED ORDER — PROPOFOL 10 MG/ML IV BOLUS
INTRAVENOUS | Status: AC
  Filled 2023-09-27: qty 20

## 2023-09-27 MED ORDER — SUGAMMADEX SODIUM 200 MG/2ML IV SOLN
INTRAVENOUS | Status: DC | PRN
  Administered 2023-09-27: 200 mg via INTRAVENOUS

## 2023-09-27 MED ORDER — TRAMADOL HCL 50 MG PO TABS
50.0000 mg | ORAL_TABLET | Freq: Four times a day (QID) | ORAL | 0 refills | Status: DC | PRN
  Filled 2023-09-27: qty 12, 3d supply, fill #0
  Filled 2023-09-27: qty 12, 7d supply, fill #0

## 2023-09-27 MED ORDER — QUETIAPINE FUMARATE 25 MG PO TABS
25.0000 mg | ORAL_TABLET | Freq: Every day | ORAL | Status: DC
  Administered 2023-09-27: 25 mg via ORAL
  Filled 2023-09-27: qty 1

## 2023-09-27 MED ORDER — FINASTERIDE 5 MG PO TABS
5.0000 mg | ORAL_TABLET | Freq: Every day | ORAL | Status: DC
  Administered 2023-09-28: 5 mg via ORAL
  Filled 2023-09-27: qty 1

## 2023-09-27 MED ORDER — DEXMEDETOMIDINE HCL IN NACL 80 MCG/20ML IV SOLN
INTRAVENOUS | Status: AC
  Filled 2023-09-27: qty 20

## 2023-09-27 MED ORDER — CALCIUM CARBONATE 1250 (500 CA) MG PO TABS
2.0000 | ORAL_TABLET | Freq: Three times a day (TID) | ORAL | Status: DC
  Administered 2023-09-27 – 2023-09-28 (×2): 2500 mg via ORAL
  Filled 2023-09-27 (×2): qty 2

## 2023-09-27 MED ORDER — ONDANSETRON HCL 4 MG/2ML IJ SOLN: 4.0000 mg | Freq: Once | INTRAMUSCULAR | Status: DC | PRN

## 2023-09-27 MED ORDER — TIMOLOL MALEATE 0.5 % OP SOLN
1.0000 [drp] | Freq: Every day | OPHTHALMIC | Status: DC
Start: 2023-09-27 — End: 2023-09-28
  Administered 2023-09-27 – 2023-09-28 (×2): 1 [drp] via OPHTHALMIC
  Filled 2023-09-27: qty 5

## 2023-09-27 MED ORDER — FENTANYL CITRATE (PF) 100 MCG/2ML IJ SOLN
INTRAMUSCULAR | Status: AC
  Filled 2023-09-27: qty 2

## 2023-09-27 MED ORDER — DOCUSATE SODIUM 100 MG PO CAPS
100.0000 mg | ORAL_CAPSULE | Freq: Two times a day (BID) | ORAL | Status: DC
  Administered 2023-09-27 – 2023-09-28 (×3): 100 mg via ORAL
  Filled 2023-09-27 (×3): qty 1

## 2023-09-27 MED ORDER — CEFAZOLIN SODIUM-DEXTROSE 2-4 GM/100ML-% IV SOLN
2.0000 g | INTRAVENOUS | Status: AC
Start: 2023-09-27 — End: 2023-09-27
  Administered 2023-09-27: 2 g via INTRAVENOUS
  Filled 2023-09-27: qty 100

## 2023-09-27 MED ORDER — LIDOCAINE 2% (20 MG/ML) 5 ML SYRINGE
INTRAMUSCULAR | Status: DC | PRN
  Administered 2023-09-27: 50 mg via INTRAVENOUS

## 2023-09-27 MED ORDER — HEMOSTATIC AGENTS (NO CHARGE) OPTIME
TOPICAL | Status: DC | PRN
  Administered 2023-09-27: 1 via TOPICAL

## 2023-09-27 MED ORDER — HYDROMORPHONE HCL 1 MG/ML IJ SOLN: 1.0000 mg | INTRAMUSCULAR | Status: DC | PRN

## 2023-09-27 MED ORDER — LACTATED RINGERS IV SOLN: INTRAVENOUS | Status: DC

## 2023-09-27 MED ORDER — ACETAMINOPHEN 325 MG PO TABS
650.0000 mg | ORAL_TABLET | Freq: Four times a day (QID) | ORAL | Status: DC | PRN
  Administered 2023-09-27: 650 mg via ORAL
  Filled 2023-09-27: qty 2

## 2023-09-27 MED ORDER — 0.9 % SODIUM CHLORIDE (POUR BTL) OPTIME
TOPICAL | Status: DC | PRN
  Administered 2023-09-27: 1000 mL

## 2023-09-27 MED ORDER — ORAL CARE MOUTH RINSE: 15.0000 mL | Freq: Once | OROMUCOSAL | Status: AC

## 2023-09-27 MED ORDER — TRAMADOL HCL 50 MG PO TABS
50.0000 mg | ORAL_TABLET | Freq: Four times a day (QID) | ORAL | Status: DC | PRN
  Administered 2023-09-28: 50 mg via ORAL
  Filled 2023-09-27: qty 1

## 2023-09-27 MED ORDER — OXYCODONE HCL 5 MG PO TABS
5.0000 mg | ORAL_TABLET | ORAL | Status: DC | PRN
  Administered 2023-09-27: 5 mg via ORAL
  Administered 2023-09-27: 10 mg via ORAL
  Filled 2023-09-27 (×2): qty 2

## 2023-09-27 MED ORDER — ONDANSETRON 4 MG PO TBDP: 4.0000 mg | ORAL_TABLET | Freq: Four times a day (QID) | ORAL | Status: DC | PRN

## 2023-09-27 SURGICAL SUPPLY — 32 items
ATTRACTOMAT 16X20 MAGNETIC DRP (DRAPES) ×1 IMPLANT
BAG COUNTER SPONGE SURGICOUNT (BAG) ×1 IMPLANT
BLADE SURG 15 STRL LF DISP TIS (BLADE) ×1 IMPLANT
BLADE SURG 15 STRL SS (BLADE) ×1
CHLORAPREP W/TINT 26 (MISCELLANEOUS) ×1 IMPLANT
CLIP TI MEDIUM 6 (CLIP) ×2 IMPLANT
CLIP TI WIDE RED SMALL 6 (CLIP) ×2 IMPLANT
COVER SURGICAL LIGHT HANDLE (MISCELLANEOUS) ×1 IMPLANT
DERMABOND ADVANCED .7 DNX12 (GAUZE/BANDAGES/DRESSINGS) ×1 IMPLANT
DRAPE LAPAROTOMY T 98X78 PEDS (DRAPES) ×1 IMPLANT
DRAPE UTILITY XL STRL (DRAPES) ×1 IMPLANT
ELECT PENCIL ROCKER SW 15FT (MISCELLANEOUS) ×1 IMPLANT
ELECT REM PT RETURN 15FT ADLT (MISCELLANEOUS) ×1 IMPLANT
GAUZE 4X4 16PLY ~~LOC~~+RFID DBL (SPONGE) ×1 IMPLANT
GLOVE SURG ORTHO 8.0 STRL STRW (GLOVE) ×1 IMPLANT
GOWN STRL REUS W/ TWL XL LVL3 (GOWN DISPOSABLE) ×2 IMPLANT
GOWN STRL REUS W/TWL XL LVL3 (GOWN DISPOSABLE) ×2
HEMOSTAT SURGICEL 2X4 FIBR (HEMOSTASIS) ×1 IMPLANT
ILLUMINATOR WAVEGUIDE N/F (MISCELLANEOUS) ×1 IMPLANT
KIT BASIN OR (CUSTOM PROCEDURE TRAY) ×1 IMPLANT
KIT TURNOVER KIT A (KITS) IMPLANT
PACK BASIC VI WITH GOWN DISP (CUSTOM PROCEDURE TRAY) ×1 IMPLANT
SHEARS HARMONIC 9CM CVD (BLADE) ×1 IMPLANT
SUT MNCRL AB 4-0 PS2 18 (SUTURE) ×1 IMPLANT
SUT SILK 2 0 (SUTURE) ×1
SUT SILK 2-0 18XBRD TIE 12 (SUTURE) IMPLANT
SUT SILK 3 0 SH 30 (SUTURE) ×1 IMPLANT
SUT VIC AB 3-0 SH 18 (SUTURE) ×2 IMPLANT
SYR BULB IRRIG 60ML STRL (SYRINGE) ×1 IMPLANT
TOWEL OR 17X26 10 PK STRL BLUE (TOWEL DISPOSABLE) ×1 IMPLANT
TOWEL OR NON WOVEN STRL DISP B (DISPOSABLE) ×1 IMPLANT
TUBING CONNECTING 10 (TUBING) ×1 IMPLANT

## 2023-09-27 NOTE — Progress Notes (Signed)
   09/27/23 1354  TOC Brief Assessment  Insurance and Status Reviewed  Patient has primary care physician Yes  Home environment has been reviewed Resides at home with spouse in single family home  Prior level of function: Independent at baseline  Prior/Current Home Services No current home services  Social Determinants of Health Reivew SDOH reviewed no interventions necessary  Readmission risk has been reviewed Yes  Transition of care needs no transition of care needs at this time

## 2023-09-27 NOTE — Interval H&P Note (Signed)
History and Physical Interval Note:  09/27/2023 7:00 AM  Randy Merritt  has presented today for surgery, with the diagnosis of THYROID GOITER WITH TRACHEAL DEVIATION.  The various methods of treatment have been discussed with the patient and family. After consideration of risks, benefits and other options for treatment, the patient has consented to    Procedure(s): TOTAL THYROIDECTOMY (N/A) as a surgical intervention.    The patient's history has been reviewed, patient examined, no change in status, stable for surgery.  I have reviewed the patient's chart and labs.  Questions were answered to the patient's satisfaction.    Darnell Level, MD [facility] Surgery [facility] practice Office: [phone]   Darnell Level

## 2023-09-27 NOTE — Plan of Care (Signed)
  Problem: Education: Goal: Knowledge of General Education information will improve Description Including pain rating scale, medication(s)/side effects and non-pharmacologic comfort measures Outcome: Progressing   

## 2023-09-27 NOTE — Anesthesia Procedure Notes (Addendum)
Procedure Name: Intubation Date/Time: 09/27/2023 7:45 AM  Performed by: Lannie Fields, DOPre-anesthesia Checklist: Patient identified, Emergency Drugs available, Suction available, Patient being monitored and Timeout performed Patient Re-evaluated:Patient Re-evaluated prior to induction Oxygen Delivery Method: Circle system utilized Preoxygenation: Pre-oxygenation with 100% oxygen Induction Type: IV induction and Cricoid Pressure applied Ventilation: Mask ventilation without difficulty Laryngoscope Size: 4 and Glidescope Grade View: Grade II Tube type: Oral Tube size: 7.0 mm Number of attempts: 2 Airway Equipment and Method: Stylet and Video-laryngoscopy Placement Confirmation: ETT inserted through vocal cords under direct vision, positive ETCO2, CO2 detector and breath sounds checked- equal and bilateral Secured at: 23 cm Tube secured with: Tape Dental Injury: Teeth and Oropharynx as per pre-operative assessment  Difficulty Due To: Difficulty was anticipated, Difficult Airway- due to anterior larynx, Difficult Airway- due to limited oral opening, Difficult Airway- due to dentition, Difficult Airway- due to reduced neck mobility and Difficult Airway- due to large tongue Future Recommendations: Recommend- induction with short-acting agent, and alternative techniques readily available Comments: Large goiter right neck, with known mild tracheal deviation and hx of ACDF in past. Mall4 on exam and almost no neck extension- went directly to glidescope w/ RSI, easy attempt at mask ventilation. Copious thin secretions suctioned from oropharynx throughout intubation.  Difficult even with glidescope due to anterior larynx, leftward deviation of larynx and very limited neck mobility.  Successful only w/ cricoid pressure, but still grade 2b view. Placed by MDA.

## 2023-09-27 NOTE — Anesthesia Postprocedure Evaluation (Signed)
Anesthesia Post Note  Patient: Gabrel Aujla  Procedure(s) Performed: TOTAL THYROIDECTOMY     Patient location during evaluation: PACU Anesthesia Type: General Level of consciousness: awake and alert, oriented and patient cooperative Pain management: pain level controlled Vital Signs Assessment: post-procedure vital signs reviewed and stable Respiratory status: spontaneous breathing, nonlabored ventilation and respiratory function stable Cardiovascular status: blood pressure returned to baseline and stable Postop Assessment: no apparent nausea or vomiting Anesthetic complications: yes   Encounter Notable Events  Notable Event Outcome Phase Comment  Difficult to intubate - expected  Intraprocedure Filed from anesthesia note documentation.    Last Vitals:  Vitals:   09/27/23 0945 09/27/23 1000  BP: (!) 121/50 101/64  Pulse: 72 67  Resp: 19 19  Temp: 36.7 C   SpO2: 100% 100%    Last Pain:  Vitals:   09/27/23 1000  TempSrc:   PainSc: 5                  Lannie Fields

## 2023-09-27 NOTE — Op Note (Signed)
Procedure Note  Pre-operative Diagnosis:  multiple thyroid nodules, enlarged thyroid gland, tracheal deviation  Post-operative Diagnosis:  same  Surgeon:  Darnell Level, MD  Assistant:  none   Procedure:  Total thyroidectomy  Anesthesia:  General  Estimated Blood Loss:  20 cc  Drains: none         Specimen: thyroid to pathology  Indications:  Patient returns to my practice for follow-up. He was seen in May 2020 for regarding a multinodular thyroid goiter. At my request he underwent a CT scan of the neck on June 03, 2023. This demonstrates a stable multinodular thyroid goiter with a dominant mass in the right thyroid lobe measuring 6.5 cm and a dominant nodule in the left thyroid lobe measuring 3.9 cm. There is a focus of calcification on the right side. There is leftward deviation of the trachea with mild narrowing. Patient has also been evaluated in the interim by ENT. He has been referred to a different ENT practice for consideration of Botox injections, presumably for management of his underlying dysphagia. He has also undergone an upper endoscopy by gastroenterology. Recent laboratory studies show elevated thyroid hormone levels with a suppressed TSH level of 0.179. Patient presents today accompanied by his wife to discuss these results and to make plans for further management.   Procedure Details: Procedure was done in OR #1 at the [facility]. The patient was brought to the operating room and placed in a supine position on the operating room table. Following administration of general anesthesia, the patient was positioned and then prepped and draped in the usual aseptic fashion. After ascertaining that an adequate level of anesthesia had been achieved, a small Kocher incision was made with #15 blade. Dissection was carried through subcutaneous tissues and platysma.Hemostasis was achieved with the electrocautery. Skin flaps were elevated cephalad and caudad from the thyroid notch to  the sternal notch. A Mahorner self-retaining retractor was placed for exposure. Strap muscles were incised in the midline and dissection was begun on the left side.  Strap muscles were reflected laterally.  Left thyroid lobe was mildly enlarged and nodular.  The left lobe was gently mobilized with blunt dissection. Superior pole vessels were dissected out and divided individually between small and medium ligaclips with the harmonic scalpel. The thyroid lobe was rolled anteriorly. Branches of the inferior thyroid artery were divided between small ligaclips with the harmonic scalpel. Inferior venous tributaries were divided between ligaclips. Both the superior and inferior parathyroid glands were identified and preserved on their vascular pedicles. The recurrent laryngeal nerve was identified and preserved along its course. The ligament of Allyson Sabal was released with the electrocautery and the gland was mobilized onto the anterior trachea. Isthmus was mobilized across the midline. There was no pyramidal lobe present. Dry pack was placed in the left neck.  The right thyroid lobe was gently mobilized with blunt dissection. Right thyroid lobe was markedly enlarged and nodular and firm. Superior pole vessels were dissected out and divided between small and medium ligaclips with the Harmonic scalpel. Inferior venous tributaries were divided between medium ligaclips with the harmonic scalpel. The right thyroid lobe was rolled anteriorly and the branches of the inferior thyroid artery divided between small ligaclips. The right recurrent laryngeal nerve was identified and preserved along its course. The ligament of Allyson Sabal was released with the electrocautery. The right thyroid lobe was mobilized onto the anterior trachea and the remainder of the thyroid was dissected off the anterior trachea and the thyroid was completely excised.  A suture was used to mark the right lobe. The entire thyroid gland was submitted to pathology for  review.  Palpation of the operative field demonstrated no evidence of residual disease and no abnormal lymph nodes.  The neck was irrigated with warm saline. Fibrillar was placed throughout the operative field. Strap muscles were approximated in the midline with interrupted 3-0 Vicryl sutures. Platysma was closed with interrupted 3-0 Vicryl sutures. Skin was closed with a running 4-0 Monocryl subcuticular suture. Wound was washed and Dermabond was applied. The patient was awakened from anesthesia and brought to the recovery room. The patient tolerated the procedure well.   Darnell Level, MD [facility] Office: [phone]

## 2023-09-27 NOTE — Plan of Care (Signed)
Plan of Care reviewed. 

## 2023-09-27 NOTE — Transfer of Care (Signed)
Immediate Anesthesia Transfer of Care Note  Patient: Randy Merritt  Procedure(s) Performed: Procedure(s): TOTAL THYROIDECTOMY (N/A)  Patient Location: PACU  Anesthesia Type:General  Level of Consciousness:  sedated, patient cooperative and responds to stimulation  Airway & Oxygen Therapy:Patient Spontanous Breathing and Patient connected to face mask oxgen  Post-op Assessment:  Report given to PACU RN and Post -op Vital signs reviewed and stable  Post vital signs:  Reviewed and stable  Last Vitals:  Vitals:   09/27/23 0548  BP: (!) 164/78  Pulse: 74  Resp: 16  Temp: 36.6 C  SpO2: 100%    Complications: No apparent anesthesia complications

## 2023-09-27 NOTE — Discharge Instructions (Signed)

## 2023-09-28 ENCOUNTER — Other Ambulatory Visit (HOSPITAL_COMMUNITY): Payer: Self-pay

## 2023-09-28 ENCOUNTER — Encounter (HOSPITAL_COMMUNITY): Payer: Self-pay | Admitting: Surgery

## 2023-09-28 DIAGNOSIS — E042 Nontoxic multinodular goiter: Secondary | ICD-10-CM | POA: Diagnosis not present

## 2023-09-28 LAB — CALCIUM: Calcium: 8.7 mg/dL — ABNORMAL LOW (ref 8.9–10.3)

## 2023-09-28 MED ORDER — MENTHOL 3 MG MT LOZG
1.0000 | LOZENGE | OROMUCOSAL | Status: DC | PRN
  Administered 2023-09-28: 3 mg via ORAL
  Filled 2023-09-28: qty 9

## 2023-09-28 NOTE — Plan of Care (Signed)

## 2023-09-28 NOTE — Discharge Summary (Signed)
Physician Discharge Summary  Patient ID: Randy Merritt MRN: [id number] DOB/AGE: [birth year] 82 y.o.  Admit date: 09/27/2023 Discharge date: 09/28/2023  Admission Diagnoses: S/p thyroidectomy  Discharge Diagnoses:  Principal Problem:   Non-toxic multinodular goiter Active Problems:   Multiple thyroid nodules   Tracheal deviation   Discharged Condition: good  Hospital Course: 19 yom s/p total  thyroidectomy doing well, calcium ok, sore. Ready for dc  Consults:  none  Significant Diagnostic Studies: none  Treatments: surgery: total thyroid  Discharge Exam: Blood pressure 122/65, pulse 70, temperature 98.6 F (37 C), temperature source Oral, resp. rate 16, height 5\' 9"  (1.753 m), weight 64.9 kg, SpO2 100%. Incisionclean no hematoma  Disposition:      Follow-up Information     Darnell Level, MD. Schedule an appointment as soon as possible for a visit in 3 week(s).   Specialty: General Surgery Why: For wound re-check Contact information: [address] [place] [address] [place] [address] [phone]                 Signed: Emelia Loron 09/28/2023, 9:16 AM

## 2023-09-28 NOTE — Progress Notes (Addendum)
Assessment unchanged. Pt and wife verbalized understanding of dc instructions through teach back about medications and follow up care. Discharged via wc to front entrance accompanied by wife and NT.

## 2023-09-30 LAB — SURGICAL PATHOLOGY

## 2023-10-01 NOTE — Progress Notes (Signed)
Final pathology is benign, as expected.  Darnell Level, MD [facility] practice Office: [phone]

## 2023-10-07 DIAGNOSIS — F5101 Primary insomnia: Secondary | ICD-10-CM | POA: Diagnosis not present

## 2023-10-07 DIAGNOSIS — F411 Generalized anxiety disorder: Secondary | ICD-10-CM | POA: Diagnosis not present

## 2023-10-07 DIAGNOSIS — F331 Major depressive disorder, recurrent, moderate: Secondary | ICD-10-CM | POA: Diagnosis not present

## 2023-10-14 DIAGNOSIS — E782 Mixed hyperlipidemia: Secondary | ICD-10-CM | POA: Diagnosis not present

## 2023-10-14 DIAGNOSIS — K219 Gastro-esophageal reflux disease without esophagitis: Secondary | ICD-10-CM | POA: Diagnosis not present

## 2023-10-14 DIAGNOSIS — N1831 Chronic kidney disease, stage 3a: Secondary | ICD-10-CM | POA: Diagnosis not present

## 2023-10-17 ENCOUNTER — Other Ambulatory Visit (HOSPITAL_COMMUNITY): Payer: Self-pay

## 2023-10-29 ENCOUNTER — Other Ambulatory Visit (HOSPITAL_COMMUNITY): Payer: Self-pay

## 2023-11-21 ENCOUNTER — Other Ambulatory Visit: Payer: Self-pay | Admitting: Surgery

## 2023-11-21 DIAGNOSIS — R1310 Dysphagia, unspecified: Secondary | ICD-10-CM

## 2023-11-21 DIAGNOSIS — E890 Postprocedural hypothyroidism: Secondary | ICD-10-CM

## 2023-11-21 DIAGNOSIS — M542 Cervicalgia: Secondary | ICD-10-CM

## 2023-11-25 DIAGNOSIS — N5201 Erectile dysfunction due to arterial insufficiency: Secondary | ICD-10-CM | POA: Diagnosis not present

## 2023-11-25 DIAGNOSIS — N401 Enlarged prostate with lower urinary tract symptoms: Secondary | ICD-10-CM | POA: Diagnosis not present

## 2023-11-25 DIAGNOSIS — R1084 Generalized abdominal pain: Secondary | ICD-10-CM | POA: Diagnosis not present

## 2023-11-25 DIAGNOSIS — R3912 Poor urinary stream: Secondary | ICD-10-CM | POA: Diagnosis not present

## 2023-11-26 DIAGNOSIS — M2012 Hallux valgus (acquired), left foot: Secondary | ICD-10-CM | POA: Diagnosis not present

## 2023-11-26 DIAGNOSIS — B351 Tinea unguium: Secondary | ICD-10-CM | POA: Diagnosis not present

## 2023-11-26 DIAGNOSIS — L84 Corns and callosities: Secondary | ICD-10-CM | POA: Diagnosis not present

## 2023-11-26 DIAGNOSIS — M79672 Pain in left foot: Secondary | ICD-10-CM | POA: Diagnosis not present

## 2023-11-26 DIAGNOSIS — L600 Ingrowing nail: Secondary | ICD-10-CM | POA: Diagnosis not present

## 2023-11-26 DIAGNOSIS — M79671 Pain in right foot: Secondary | ICD-10-CM | POA: Diagnosis not present

## 2023-11-26 DIAGNOSIS — I739 Peripheral vascular disease, unspecified: Secondary | ICD-10-CM | POA: Diagnosis not present

## 2023-11-27 DIAGNOSIS — F331 Major depressive disorder, recurrent, moderate: Secondary | ICD-10-CM | POA: Diagnosis not present

## 2023-11-27 DIAGNOSIS — F411 Generalized anxiety disorder: Secondary | ICD-10-CM | POA: Diagnosis not present

## 2023-11-27 DIAGNOSIS — F5101 Primary insomnia: Secondary | ICD-10-CM | POA: Diagnosis not present

## 2023-11-29 ENCOUNTER — Other Ambulatory Visit (HOSPITAL_COMMUNITY): Payer: Self-pay

## 2023-12-10 DIAGNOSIS — K2100 Gastro-esophageal reflux disease with esophagitis, without bleeding: Secondary | ICD-10-CM | POA: Diagnosis not present

## 2023-12-10 DIAGNOSIS — E039 Hypothyroidism, unspecified: Secondary | ICD-10-CM | POA: Diagnosis not present

## 2023-12-10 DIAGNOSIS — E7849 Other hyperlipidemia: Secondary | ICD-10-CM | POA: Diagnosis not present

## 2023-12-10 DIAGNOSIS — F32A Depression, unspecified: Secondary | ICD-10-CM | POA: Diagnosis not present

## 2023-12-10 DIAGNOSIS — N401 Enlarged prostate with lower urinary tract symptoms: Secondary | ICD-10-CM | POA: Diagnosis not present

## 2023-12-10 DIAGNOSIS — I10 Essential (primary) hypertension: Secondary | ICD-10-CM | POA: Diagnosis not present

## 2023-12-17 ENCOUNTER — Ambulatory Visit
Admission: RE | Admit: 2023-12-17 | Discharge: 2023-12-17 | Disposition: A | Discharge status: Home / Self Care | Payer: Medicare HMO | Source: Ambulatory Visit | Attending: Surgery | Admitting: Surgery

## 2023-12-17 DIAGNOSIS — R1310 Dysphagia, unspecified: Secondary | ICD-10-CM

## 2023-12-17 DIAGNOSIS — M542 Cervicalgia: Secondary | ICD-10-CM

## 2023-12-17 DIAGNOSIS — E890 Postprocedural hypothyroidism: Secondary | ICD-10-CM

## 2023-12-17 DIAGNOSIS — Z9889 Other specified postprocedural states: Secondary | ICD-10-CM | POA: Diagnosis not present

## 2023-12-17 MED ORDER — IOPAMIDOL (ISOVUE-300) INJECTION 61%
75.0000 mL | Freq: Once | INTRAVENOUS | Status: AC | PRN
Start: 2023-12-17 — End: 2023-12-17
  Administered 2023-12-17: 75 mL via INTRAVENOUS

## 2023-12-23 ENCOUNTER — Other Ambulatory Visit (HOSPITAL_COMMUNITY): Payer: Self-pay

## 2023-12-24 ENCOUNTER — Encounter: Payer: Self-pay | Admitting: Surgery

## 2023-12-24 ENCOUNTER — Other Ambulatory Visit (HOSPITAL_COMMUNITY): Payer: Self-pay

## 2023-12-24 NOTE — Progress Notes (Signed)
 CT scan shows no complications from thyroidectomy that would explain the pain that the patient is experiencing.  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  Krystal Spinner, MD [facility] practice Office: [phone]

## 2023-12-31 DIAGNOSIS — N429 Disorder of prostate, unspecified: Secondary | ICD-10-CM | POA: Diagnosis not present

## 2023-12-31 DIAGNOSIS — N1831 Chronic kidney disease, stage 3a: Secondary | ICD-10-CM | POA: Diagnosis not present

## 2023-12-31 DIAGNOSIS — Z8673 Personal history of transient ischemic attack (TIA), and cerebral infarction without residual deficits: Secondary | ICD-10-CM | POA: Diagnosis not present

## 2023-12-31 DIAGNOSIS — N401 Enlarged prostate with lower urinary tract symptoms: Secondary | ICD-10-CM | POA: Diagnosis not present

## 2023-12-31 DIAGNOSIS — D72829 Elevated white blood cell count, unspecified: Secondary | ICD-10-CM | POA: Diagnosis not present

## 2023-12-31 DIAGNOSIS — I10 Essential (primary) hypertension: Secondary | ICD-10-CM | POA: Diagnosis not present

## 2023-12-31 DIAGNOSIS — E039 Hypothyroidism, unspecified: Secondary | ICD-10-CM | POA: Diagnosis not present

## 2023-12-31 DIAGNOSIS — D508 Other iron deficiency anemias: Secondary | ICD-10-CM | POA: Diagnosis not present

## 2023-12-31 DIAGNOSIS — E890 Postprocedural hypothyroidism: Secondary | ICD-10-CM | POA: Diagnosis not present

## 2023-12-31 DIAGNOSIS — D72819 Decreased white blood cell count, unspecified: Secondary | ICD-10-CM | POA: Diagnosis not present

## 2023-12-31 DIAGNOSIS — R1312 Dysphagia, oropharyngeal phase: Secondary | ICD-10-CM | POA: Diagnosis not present

## 2023-12-31 DIAGNOSIS — R7303 Prediabetes: Secondary | ICD-10-CM | POA: Diagnosis not present

## 2024-01-02 ENCOUNTER — Encounter (HOSPITAL_COMMUNITY): Payer: Self-pay | Admitting: *Deleted

## 2024-01-02 ENCOUNTER — Ambulatory Visit (HOSPITAL_COMMUNITY)
Admission: EM | Admit: 2024-01-02 | Discharge: 2024-01-02 | Disposition: A | Discharge status: Home / Self Care | Payer: Medicare HMO

## 2024-01-02 DIAGNOSIS — R52 Pain, unspecified: Secondary | ICD-10-CM

## 2024-01-02 DIAGNOSIS — R1310 Dysphagia, unspecified: Secondary | ICD-10-CM

## 2024-01-02 NOTE — ED Triage Notes (Signed)
Pts wife states he has continuous left sided neck pain since his surgery in November he has seen his surgeon since and had a CT scan. The recommendation is to follow up with ENT wife states they have an appt on 01/27/2024 but they don't know what else to do about the pain. It hurts to even drink water. Wife states that he refuses to eat or drink due to the pain. Pt states he has took tylenol but it doesn't help, wife is asking for meds for the pain until they can get into the ENT.

## 2024-01-02 NOTE — ED Notes (Signed)
Patient is being discharged from the Urgent Care and sent to the Emergency Department via POV . Per Cathlean Marseilles, NP, patient is in need of higher level of care due to neck pain. Patient is aware and verbalizes understanding of plan of care.  Vitals:   01/02/24 1610  BP: 130/68  Pulse: 80  Resp: 18  Temp: (!) 97.4 F (36.3 C)  SpO2: 99%

## 2024-01-02 NOTE — ED Provider Notes (Signed)
Patient presents today with wife for 31-month history of left-sided neck pain ever since surgery in November to have thyroid removed.  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  They have appointment with ENT in 3 weeks, but patient reports the pain is unbearable.  He is moaning in pain today in triage.  He is able to swallow, but is not able to eat or drink very much because of the pain.  He was taking tramadol for the pain but according to the wife, insurance stopped covering the medication because it can interact with alprazolam.  He is taking Tylenol for the pain without improvement.  On exam, tympanic membrane's are intact bilaterally and nonerythematous.  His oropharynx is clear and patent.  He is able to move his neck, however it is painful.  Given severity of pain and length of symptoms ongoing, I recommended further evaluation and management in emergency room for pain control and for possible IV nutrition if indicated.  Patient and wife are in agreement to plan.  Patient is safe to transport via private vehicle.   Valentino Nose, NP 01/02/24 1700

## 2024-01-02 NOTE — Discharge Instructions (Signed)
Please go to emergency room for further evaluation and management of your symptoms

## 2024-01-03 ENCOUNTER — Other Ambulatory Visit (HOSPITAL_COMMUNITY): Payer: Self-pay

## 2024-01-10 ENCOUNTER — Emergency Department (HOSPITAL_COMMUNITY)
Admission: EM | Admit: 2024-01-10 | Discharge: 2024-01-10 | Disposition: A | Discharge status: Home / Self Care | Payer: Medicare HMO | Attending: Emergency Medicine | Admitting: Emergency Medicine

## 2024-01-10 ENCOUNTER — Encounter (HOSPITAL_COMMUNITY): Payer: Self-pay

## 2024-01-10 ENCOUNTER — Emergency Department (HOSPITAL_COMMUNITY): Payer: Medicare HMO

## 2024-01-10 ENCOUNTER — Other Ambulatory Visit: Payer: Self-pay

## 2024-01-10 DIAGNOSIS — R1310 Dysphagia, unspecified: Secondary | ICD-10-CM | POA: Insufficient documentation

## 2024-01-10 DIAGNOSIS — R9431 Abnormal electrocardiogram [ECG] [EKG]: Secondary | ICD-10-CM | POA: Diagnosis not present

## 2024-01-10 DIAGNOSIS — M542 Cervicalgia: Secondary | ICD-10-CM | POA: Insufficient documentation

## 2024-01-10 DIAGNOSIS — Z7982 Long term (current) use of aspirin: Secondary | ICD-10-CM | POA: Diagnosis not present

## 2024-01-10 DIAGNOSIS — R059 Cough, unspecified: Secondary | ICD-10-CM | POA: Diagnosis not present

## 2024-01-10 LAB — CBC WITH DIFFERENTIAL/PLATELET
Abs Immature Granulocytes: 0 10*3/uL (ref 0.00–0.07)
Basophils Absolute: 0 10*3/uL (ref 0.0–0.1)
Basophils Relative: 0 %
Eosinophils Absolute: 0 10*3/uL (ref 0.0–0.5)
Eosinophils Relative: 1 %
HCT: 36.6 % — ABNORMAL LOW (ref 39.0–52.0)
Hemoglobin: 11.9 g/dL — ABNORMAL LOW (ref 13.0–17.0)
Immature Granulocytes: 0 %
Lymphocytes Relative: 39 %
Lymphs Abs: 0.9 10*3/uL (ref 0.7–4.0)
MCH: 30.6 pg (ref 26.0–34.0)
MCHC: 32.5 g/dL (ref 30.0–36.0)
MCV: 94.1 fL (ref 80.0–100.0)
Monocytes Absolute: 0.2 10*3/uL (ref 0.1–1.0)
Monocytes Relative: 6 %
Neutro Abs: 1.3 10*3/uL — ABNORMAL LOW (ref 1.7–7.7)
Neutrophils Relative %: 54 %
Platelets: 152 10*3/uL (ref 150–400)
RBC: 3.89 MIL/uL — ABNORMAL LOW (ref 4.22–5.81)
RDW: 12.8 % (ref 11.5–15.5)
WBC: 2.4 10*3/uL — ABNORMAL LOW (ref 4.0–10.5)
nRBC: 0 % (ref 0.0–0.2)

## 2024-01-10 LAB — COMPREHENSIVE METABOLIC PANEL
ALT: 18 U/L (ref 0–44)
AST: 21 U/L (ref 15–41)
Albumin: 3.9 g/dL (ref 3.5–5.0)
Alkaline Phosphatase: 103 U/L (ref 38–126)
Anion gap: 9 (ref 5–15)
BUN: 14 mg/dL (ref 8–23)
CO2: 27 mmol/L (ref 22–32)
Calcium: 9.5 mg/dL (ref 8.9–10.3)
Chloride: 106 mmol/L (ref 98–111)
Creatinine, Ser: 1.26 mg/dL — ABNORMAL HIGH (ref 0.61–1.24)
GFR, Estimated: 57 mL/min — ABNORMAL LOW (ref >60)
Glucose, Bld: 108 mg/dL — ABNORMAL HIGH (ref 70–99)
Potassium: 3.5 mmol/L (ref 3.5–5.1)
Sodium: 142 mmol/L (ref 135–145)
Total Bilirubin: 0.8 mg/dL (ref 0.0–1.2)
Total Protein: 6.5 g/dL (ref 6.5–8.1)

## 2024-01-10 LAB — RESP PANEL BY RT-PCR (RSV, FLU A&B, COVID)  RVPGX2
Influenza A by PCR: NEGATIVE
Influenza B by PCR: NEGATIVE
Resp Syncytial Virus by PCR: NEGATIVE
SARS Coronavirus 2 by RT PCR: NEGATIVE

## 2024-01-10 LAB — I-STAT CHEM 8, ED
BUN: 12 mg/dL (ref 8–23)
Calcium, Ion: 1.29 mmol/L (ref 1.15–1.40)
Chloride: 104 mmol/L (ref 98–111)
Creatinine, Ser: 1.4 mg/dL — ABNORMAL HIGH (ref 0.61–1.24)
Glucose, Bld: 108 mg/dL — ABNORMAL HIGH (ref 70–99)
HCT: 37 % — ABNORMAL LOW (ref 39.0–52.0)
Hemoglobin: 12.6 g/dL — ABNORMAL LOW (ref 13.0–17.0)
Potassium: 3.5 mmol/L (ref 3.5–5.1)
Sodium: 144 mmol/L (ref 135–145)
TCO2: 27 mmol/L (ref 22–32)

## 2024-01-10 LAB — TSH: TSH: <0.01 u[IU]/mL — ABNORMAL LOW (ref 0.350–4.500)

## 2024-01-10 LAB — T4, FREE: Free T4: 2.12 ng/dL — ABNORMAL HIGH (ref 0.61–1.12)

## 2024-01-10 MED ORDER — ONDANSETRON HCL 4 MG/2ML IJ SOLN
4.0000 mg | Freq: Once | INTRAMUSCULAR | Status: AC
  Administered 2024-01-10: 4 mg via INTRAVENOUS
  Filled 2024-01-10: qty 2

## 2024-01-10 MED ORDER — SODIUM CHLORIDE 0.9 % IV BOLUS
1000.0000 mL | Freq: Once | INTRAVENOUS | Status: AC
  Administered 2024-01-10: 1000 mL via INTRAVENOUS

## 2024-01-10 MED ORDER — MORPHINE SULFATE (PF) 4 MG/ML IV SOLN
4.0000 mg | Freq: Once | INTRAVENOUS | Status: AC
  Administered 2024-01-10: 4 mg via INTRAVENOUS
  Filled 2024-01-10: qty 1

## 2024-01-10 MED ORDER — LIDOCAINE VISCOUS HCL 2 % MT SOLN: 15.0000 mL | OROMUCOSAL | 0 refills | Status: DC | PRN

## 2024-01-10 MED ORDER — LIDOCAINE VISCOUS HCL 2 % MT SOLN
15.0000 mL | Freq: Once | OROMUCOSAL | Status: AC
  Administered 2024-01-10: 15 mL via OROMUCOSAL
  Filled 2024-01-10: qty 15

## 2024-01-10 MED ORDER — MORPHINE SULFATE 15 MG PO TABS: 7.5000 mg | ORAL_TABLET | ORAL | 0 refills | Status: AC | PRN

## 2024-01-10 MED ORDER — ONDANSETRON 4 MG PO TBDP: ORAL_TABLET | ORAL | 0 refills | Status: DC

## 2024-01-10 MED ORDER — IOHEXOL 300 MG/ML  SOLN
80.0000 mL | Freq: Once | INTRAMUSCULAR | Status: AC | PRN
  Administered 2024-01-10: 80 mL via INTRAVENOUS

## 2024-01-10 NOTE — ED Notes (Signed)
AVS with prescriptions provided to and discussed with patient and family member at bedside. Pt verbalizes understanding of discharge instructions and denies any questions or concerns at this time. Pt has ride home. Pt ambulated out of department independently with steady gait.  

## 2024-01-10 NOTE — Discharge Instructions (Signed)
 Follow-up with ENT in the office.  The ENT had talked on the phone and recommends the ENT notes on this paperwork.  They are going to try to call her to see if they can expedite your appointment.  Please call them in the next couple days if you have not heard from them.  Please return to the Emergency Department for inability to swallow.  Take 4 over the counter ibuprofen tablets 3 times a day or 2 over-the-counter naproxen tablets twice a day for pain. Also take tylenol 1000mg (2 extra strength) four times a day.   Then take the pain medicine if you feel like you need it. Narcotics do not help with the pain, they only make you care about it less.  You can become addicted to this, people may break into your house to steal it.  It will constipate you.  If you drive under the influence of this medicine you can get a DUI.

## 2024-01-10 NOTE — ED Provider Notes (Signed)
 [place] [facility] AT [facility] Provider Note   CSN: [id number] Arrival date & time: 01/10/24  0865     History  Chief Complaint  Patient presents with   Neck Pain    Randy Merritt is a 84 y.o. male.  83 yo M with a chief complaint of left-sided neck pain.  He said this has been going on since he had a thyroidectomy done about 5 months ago.  Has been getting progressively worse over time.  Had seen his general surgeon back in the office who had recommended ENT follow-up.  He still is about 3 weeks out from that and the pain had been getting progressively worse and so came here for evaluation.  He has been coughing up a little bit of sputum.   Neck Pain      Home Medications Prior to Admission medications   Medication Sig Start Date End Date Taking? Authorizing Provider  lidocaine (XYLOCAINE) 2 % solution Use as directed 15 mLs in the mouth or throat as needed for mouth pain. 01/10/24  Yes Melene Plan, DO  morphine (MSIR) 15 MG tablet Take 0.5 tablets (7.5 mg total) by mouth every 4 (four) hours as needed for severe pain (pain score 7-10). 01/10/24  Yes Melene Plan, DO  ondansetron (ZOFRAN-ODT) 4 MG disintegrating tablet 4mg  ODT q4 hours prn nausea/vomit 01/10/24  Yes Melene Plan, DO  acetaminophen (TYLENOL) 500 MG tablet Take 2 tablets (1,000 mg total) by mouth every 8 (eight) hours. Patient taking differently: Take 1,000 mg by mouth every 8 (eight) hours as needed for moderate pain (pain score 4-6). 10/15/19   Sherrie George, PA-C  ALPRAZolam Prudy Feeler) 0.25 MG tablet Take 0.25 mg by mouth in the morning and at bedtime.    [provider]  aspirin EC 81 MG tablet Take 1 tablet (81 mg total) by mouth daily. Swallow whole. 06/11/23 06/10/24  Rolly Salter, MD  calcium carbonate (TUMS) 500 MG chewable tablet Chew 2 tablets (400 mg of elemental calcium total) by mouth 3 (three) times daily. 09/27/23   Darnell Level, MD  citalopram (CELEXA) 10 MG tablet  Take 10 mg by mouth daily.    [provider]  docusate sodium (COLACE) 100 MG capsule Take 1 capsule (100 mg total) by mouth 2 (two) times daily. 06/11/23   Rolly Salter, MD  famotidine (PEPCID) 20 MG tablet Take 1 tablet (20 mg total) by mouth 2 (two) times daily. Patient taking differently: Take 20 mg by mouth daily. 08/15/23   Ashok Croon, MD  feeding supplement (ENSURE ENLIVE / ENSURE PLUS) LIQD Take 237 mLs by mouth 3 (three) times daily between meals. Patient taking differently: Take 237 mLs by mouth 2 (two) times daily between meals. 06/11/23   Rolly Salter, MD  finasteride (PROSCAR) 5 MG tablet Take 5 mg by mouth daily.    [provider]  fluticasone (FLONASE) 50 MCG/ACT nasal spray Place 2 sprays into both nostrils daily as needed for allergies.    [provider]  levothyroxine (SYNTHROID) 100 MCG tablet Take 1 tablet (100 mcg total) by mouth daily before breakfast. 09/27/23   Darnell Level, MD  ondansetron (ZOFRAN) 4 MG tablet Take 1 tablet (4 mg total) by mouth every 6 (six) hours as needed for nausea. 06/11/23   Rolly Salter, MD  pantoprazole (PROTONIX) 40 MG tablet Take 1 tablet (40 mg total) by mouth 2 (two) times daily before a meal. 06/11/23   Rolly Salter, MD  QUEtiapine (SEROQUEL) 25 MG tablet Take 1 tablet (25 mg total) by mouth at bedtime. 06/11/23   Rolly Salter, MD  rosuvastatin (CRESTOR) 20 MG tablet Take 20 mg by mouth daily. 05/26/21   [provider]  timolol (TIMOPTIC) 0.5 % ophthalmic solution Place 1 drop into both eyes daily.    [provider]  traMADol (ULTRAM) 50 MG tablet Take 1 tablet (50 mg total) by mouth every 6 (six) hours as needed for moderate pain (pain score 4-6). 09/27/23   Darnell Level, MD      Allergies    Alprazolam, Bupropion hcl er (xl), Cyclobenzaprine, Escitalopram, Mirtazapine, and Prednisone    Review of Systems   Review of Systems  Musculoskeletal:  Positive for neck pain.     Physical Exam Updated Vital Signs BP (!) 126/106 (BP Location: Left Arm)   Pulse 74   Temp (!) 97.5 F (36.4 C) (Oral)   Resp 16   Ht 5\' 9"  (1.753 m)   Wt 64.9 kg   SpO2 100%   BMI 21.13 kg/m  Physical Exam Vitals and nursing note reviewed.  Constitutional:      Appearance: He is well-developed.  HENT:     Head: Normocephalic and atraumatic.     Mouth/Throat:     Comments: Swollen turbinates posterior nasal drip Eyes:     Pupils: Pupils are equal, round, and reactive to light.  Neck:     Vascular: No JVD.  Cardiovascular:     Rate and Rhythm: Normal rate and regular rhythm.     Heart sounds: No murmur heard.    No friction rub. No gallop.  Pulmonary:     Effort: No respiratory distress.     Breath sounds: No wheezing.  Abdominal:     General: There is no distension.     Tenderness: There is no abdominal tenderness. There is no guarding or rebound.  Musculoskeletal:        General: Normal range of motion.     Cervical back: Normal range of motion and neck supple.  Skin:    Coloration: Skin is not pale.     Findings: No rash.  Neurological:     Mental Status: He is alert and oriented to person, place, and time.  Psychiatric:        Behavior: Behavior normal.     ED Results / Procedures / Treatments   Labs (all labs ordered are listed, but only abnormal results are displayed) Labs Reviewed  CBC WITH DIFFERENTIAL/PLATELET - Abnormal; Notable for the following components:      Result Value   WBC 2.4 (*)    RBC 3.89 (*)    Hemoglobin 11.9 (*)    HCT 36.6 (*)    Neutro Abs 1.3 (*)    All other components within normal limits  COMPREHENSIVE METABOLIC PANEL - Abnormal; Notable for the following components:   Glucose, Bld 108 (*)    Creatinine, Ser 1.26 (*)    GFR, Estimated 57 (*)    All other components within normal limits  TSH - Abnormal; Notable for the following components:   TSH <0.010 (*)    All other components within normal limits  I-STAT CHEM  8, ED - Abnormal; Notable for the following components:   Creatinine, Ser 1.40 (*)    Glucose, Bld 108 (*)    Hemoglobin 12.6 (*)    HCT 37.0 (*)    All other components within normal limits  RESP PANEL BY RT-PCR (RSV, FLU  A&B, COVID)  RVPGX2  T4, FREE    EKG EKG Interpretation Date/Time:  Friday January 10 2024 09:36:41 EST Ventricular Rate:  79 PR Interval:  145 QRS Duration:  80 QT Interval:  366 QTC Calculation: 420 R Axis:   76  Text Interpretation: Sinus rhythm Supraventricular bigeminy No significant change since last tracing Confirmed by Melene Plan [phone]) on 01/10/2024 10:00:49 AM  Radiology CT Soft Tissue Neck W Contrast Result Date: 01/10/2024 CLINICAL DATA:  Left-sided neck pain. Epiglottitis or tonsillitis suspected EXAM: CT NECK WITH CONTRAST TECHNIQUE: Multidetector CT imaging of the neck was performed using the standard protocol following the bolus administration of intravenous contrast. RADIATION DOSE REDUCTION: This exam was performed according to the departmental dose-optimization program which includes automated exposure control, adjustment of the mA and/or kV according to patient size and/or use of iterative reconstruction technique. CONTRAST:  80mL OMNIPAQUE IOHEXOL 300 MG/ML  SOLN COMPARISON:  12/17/2023 FINDINGS: Pharynx and larynx: Small volume secretions layer in the throat. No evidence of mass or inflammation. Salivary glands: Negative Thyroid: Total thyroidectomy. No collection or nodularity in the thyroid bed. Lymph nodes: No heterogeneity or new/suspicious enlargement. 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 Multilevel solid cervical fusion with superimposed bulky endplate spurring. Upper chest: Clear apical lungs. IMPRESSION: 1. No acute finding or change from 12/17/2023. 2.  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 Electronically Signed   By: Tiburcio Pea M.D.   On: 01/10/2024 11:07    Procedures Procedures    Medications Ordered in ED Medications  sodium chloride 0.9 % bolus 1,000 mL (0 mLs Intravenous Stopped 01/10/24 1122)  morphine (PF) 4 MG/ML injection 4 mg (4 mg Intravenous Given 01/10/24 0937)  ondansetron (ZOFRAN) injection 4 mg (4 mg Intravenous Given 01/10/24 0938)  lidocaine (XYLOCAINE) 2 % viscous mouth solution 15 mL (15 mLs Mouth/Throat Given 01/10/24 0938)  iohexol (OMNIPAQUE) 300 MG/ML solution 80 mL (80 mLs Intravenous Contrast Given 01/10/24 1030)  morphine (PF) 4 MG/ML injection 4 mg (4 mg Intravenous Given 01/10/24 1222)  ondansetron (ZOFRAN) injection 4 mg (4 mg Intravenous Given 01/10/24 1222)  lidocaine (XYLOCAINE) 2 % viscous mouth solution 15 mL (15 mLs Mouth/Throat Given 01/10/24 1222)    ED Course/ Medical Decision Making/ A&P                                 Medical Decision Making Amount and/or Complexity of Data Reviewed Labs: ordered. Radiology: ordered. ECG/medicine tests: ordered.  Risk Prescription drug management.   83 yo M with a chief complaint of left-sided neck pain.  This been going on for about 5 months now but has gotten progressively worse.  Worse over the past few days.  He had a total thyroidectomy that was done just prior to the symptoms starting.  Was seen by his general surgeon had CT imaging that was unremarkable and there were plans to have him follow-up with ENT.  Unfortunately has had trouble getting an acute appointment with them.  Has an appointment scheduled for March 17.  Having worsening pain with swallowing came here for evaluation.  Will obtain laboratory evaluation.  Repeat CT.  Attempt to treat the patient's pain.  He does have some signs and symptoms consistent with an upper respiratory illness as well, could be complicating things.  Will send  off COVID flu RSV  testing.  CT imaging without obvious change from prior.  Lab work without significant findings of dehydration, no acute anemia.  No leukocytosis.  I discussed the case with Dr. Mylo Red, ENT.  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  He did think that the benefit from more urgent ENT follow-up.  He had discussed case with ENT who had seen him previously, Dr. Ernestene Kiel.  Thought would benefit from ENT with speciality in swallowing.  He will discuss with Dr. Irene Pap, and see if they can arrange for more urgent appointment.  As the patient is able to swallow here and his pain is much better controlled felt reasonable for outpatient follow-up.  1:25 PM:  I have discussed the diagnosis/risks/treatment options with the patient.  Evaluation and diagnostic testing in the emergency department does not suggest an emergent condition requiring admission or immediate intervention beyond what has been performed at this time.  They will follow up with PCP. We also discussed returning to the ED immediately if new or worsening sx occur. We discussed the sx which are most concerning (e.g., sudden worsening pain, fever, inability to tolerate by mouth) that necessitate immediate return. Medications administered to the patient during their visit and any new prescriptions provided to the patient are listed below.  Medications given during this visit Medications  sodium chloride 0.9 % bolus 1,000 mL (0 mLs Intravenous Stopped 01/10/24 1122)  morphine (PF) 4 MG/ML injection 4 mg (4 mg Intravenous Given 01/10/24 0937)  ondansetron (ZOFRAN) injection 4 mg (4 mg Intravenous Given 01/10/24 0938)  lidocaine (XYLOCAINE) 2 % viscous mouth solution 15 mL (15 mLs Mouth/Throat Given 01/10/24 0938)  iohexol (OMNIPAQUE) 300 MG/ML solution 80 mL (80 mLs Intravenous Contrast Given 01/10/24 1030)  morphine (PF) 4 MG/ML injection 4 mg (4 mg Intravenous Given 01/10/24 1222)  ondansetron  (ZOFRAN) injection 4 mg (4 mg Intravenous Given 01/10/24 1222)  lidocaine (XYLOCAINE) 2 % viscous mouth solution 15 mL (15 mLs Mouth/Throat Given 01/10/24 1222)     The patient appears reasonably screen and/or stabilized for discharge and I doubt any other medical condition or other [facility] requiring further screening, evaluation, or treatment in the ED at this time prior to discharge.          Final Clinical Impression(s) / ED Diagnoses Final diagnoses:  Odynophagia    Rx / DC Orders ED Discharge Orders          Ordered    lidocaine (XYLOCAINE) 2 % solution  As needed        01/10/24 1256    morphine (MSIR) 15 MG tablet  Every 4 hours PRN        01/10/24 1256    ondansetron (ZOFRAN-ODT) 4 MG disintegrating tablet        01/10/24 1256              [place], DO 01/10/24 1325

## 2024-01-10 NOTE — ED Triage Notes (Signed)
 Patient is here for evaluation of left sided neck pain. Reports having surgery in November where they removed some scar tissue on the left side. Pt reports pain from the left ear down to the left side of throat. Pt has ENT appt March 17 but states the pain is too severe. Also reports to this nurse that he had some tightness in his chest this morning causing him some difficulty breathing. Pt states, "it was because of the mucous." Pt went to urgent care 8 days ago for the same complaint, stated they didn't do anything.

## 2024-01-16 DIAGNOSIS — H5202 Hypermetropia, left eye: Secondary | ICD-10-CM | POA: Diagnosis not present

## 2024-01-16 DIAGNOSIS — H40023 Open angle with borderline findings, high risk, bilateral: Secondary | ICD-10-CM | POA: Diagnosis not present

## 2024-01-16 DIAGNOSIS — H5211 Myopia, right eye: Secondary | ICD-10-CM | POA: Diagnosis not present

## 2024-01-21 DIAGNOSIS — K219 Gastro-esophageal reflux disease without esophagitis: Secondary | ICD-10-CM | POA: Diagnosis not present

## 2024-01-21 DIAGNOSIS — Z0000 Encounter for general adult medical examination without abnormal findings: Secondary | ICD-10-CM | POA: Diagnosis not present

## 2024-01-21 DIAGNOSIS — J309 Allergic rhinitis, unspecified: Secondary | ICD-10-CM | POA: Diagnosis not present

## 2024-01-21 DIAGNOSIS — Z0189 Encounter for other specified special examinations: Secondary | ICD-10-CM | POA: Diagnosis not present

## 2024-01-21 DIAGNOSIS — E559 Vitamin D deficiency, unspecified: Secondary | ICD-10-CM | POA: Diagnosis not present

## 2024-01-21 DIAGNOSIS — I700 Atherosclerosis of aorta: Secondary | ICD-10-CM | POA: Diagnosis not present

## 2024-01-21 DIAGNOSIS — Z79899 Other long term (current) drug therapy: Secondary | ICD-10-CM | POA: Diagnosis not present

## 2024-01-21 DIAGNOSIS — I509 Heart failure, unspecified: Secondary | ICD-10-CM | POA: Diagnosis not present

## 2024-01-21 DIAGNOSIS — N1831 Chronic kidney disease, stage 3a: Secondary | ICD-10-CM | POA: Diagnosis not present

## 2024-01-21 DIAGNOSIS — F0390 Unspecified dementia without behavioral disturbance: Secondary | ICD-10-CM | POA: Diagnosis not present

## 2024-01-21 DIAGNOSIS — R972 Elevated prostate specific antigen [PSA]: Secondary | ICD-10-CM | POA: Diagnosis not present

## 2024-01-21 DIAGNOSIS — F331 Major depressive disorder, recurrent, moderate: Secondary | ICD-10-CM | POA: Diagnosis not present

## 2024-01-21 DIAGNOSIS — I129 Hypertensive chronic kidney disease with stage 1 through stage 4 chronic kidney disease, or unspecified chronic kidney disease: Secondary | ICD-10-CM | POA: Diagnosis not present

## 2024-01-21 DIAGNOSIS — E039 Hypothyroidism, unspecified: Secondary | ICD-10-CM | POA: Diagnosis not present

## 2024-01-22 DIAGNOSIS — F5101 Primary insomnia: Secondary | ICD-10-CM | POA: Diagnosis not present

## 2024-01-22 DIAGNOSIS — F331 Major depressive disorder, recurrent, moderate: Secondary | ICD-10-CM | POA: Diagnosis not present

## 2024-01-22 DIAGNOSIS — F411 Generalized anxiety disorder: Secondary | ICD-10-CM | POA: Diagnosis not present

## 2024-01-27 DIAGNOSIS — M2578 Osteophyte, vertebrae: Secondary | ICD-10-CM | POA: Diagnosis not present

## 2024-01-27 DIAGNOSIS — M542 Cervicalgia: Secondary | ICD-10-CM | POA: Diagnosis not present

## 2024-01-27 DIAGNOSIS — R1314 Dysphagia, pharyngoesophageal phase: Secondary | ICD-10-CM | POA: Diagnosis not present

## 2024-01-27 DIAGNOSIS — E890 Postprocedural hypothyroidism: Secondary | ICD-10-CM | POA: Diagnosis not present

## 2024-01-27 DIAGNOSIS — K219 Gastro-esophageal reflux disease without esophagitis: Secondary | ICD-10-CM | POA: Diagnosis not present

## 2024-02-12 DIAGNOSIS — R1310 Dysphagia, unspecified: Secondary | ICD-10-CM | POA: Diagnosis not present

## 2024-02-13 ENCOUNTER — Ambulatory Visit (INDEPENDENT_AMBULATORY_CARE_PROVIDER_SITE_OTHER): Payer: Medicare HMO | Admitting: Otolaryngology

## 2024-02-13 NOTE — Progress Notes (Deleted)
 ENT Progress Note:   Update 02/13/2024  Discussed the use of AI scribe software for clinical note transcription with the patient, who gave verbal consent to proceed.  History of Present Illness       Records Reviewed:  Initial Evaluation  Reason for Consult: dysphagia   HPI: Randy Merritt is an 83 y.o. male with hx of a large thyroid goiter, f/b Dr Randy Merritt, hx of ACDF few years ago, who is here for evaluation of longstanding dysphagia symptoms prior to his planned thyroidectomy.  He is accompanied by his wife who is the primary caregiver and provided most of the history due to patient's memory problems which is his baseline. He had EGD done June 2024, which revealed narrowing of UES and subtotal narrowing of LES, for which she underwent dilation with no symptom improvement.  He was then admitted to the hospital due to acute worsening of dysphagia in early August 2024, and again GI was consulted, and records indicate that per GI his dysphagia symptoms were thought to be due to large thyroid goiter and compressive symptoms as well as cervical osteophyte.  His swallowing symptoms improved after the admission.  He has history of GERD on Famotidine in pm, and Pantoprazole BID.  He had a recent MBS which showed mild pharynx cervical dysphagia, with residuals in the hypopharynx but no aspiration or penetration.  He did three sessions of swallow therapy and did not experience any changes.   Records Reviewed:  GI consult note 06/04/23 HPI: Randy Merritt is a 83 y.o. male admitted ongoing dysphagia and weight loss.  Ongoing, but worsening, problems for months.  Endoscopy June 2024 by Dr. 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  Prior neck surgery.  Osteophytes and thyromegaly with imaging suggsting mass effect.  Has lost weight due to progressive inability to eat solids, and now, liquids.   Dysphagia.   Endoscopy showed narrowing UES and possible subtle narrowing LES.  Suspect UES findings are from extrinsic compression (thyroid +/- cervical osteophytes).  Patient had esophageal dilatation to 16mm about one month ago with NO improvement.  Do not think patient's dysphagia is an esophageal process but, rather, a matter of extrinsic compression of the esophagus.   Plan:    No further GI work-up or intervention is likely to be of benefit. Will need ongoing input from ENT and CCS and maybe neurosurgery (for possible osteophytes, prior neck surgery). [facility] will sign-off; please call with questions; thank you for the consultation.  EGD Op Note 01/30/19 Findings: No endoscopic abnormality was evident in the esophagus to explain the patient's complaint of dysphagia. It was decided, however, to proceed with dilation of the entire esophagus. A guidewire was placed and the scope was withdrawn. Dilation was performed with a Savary dilator with no resistance at 12.8 mm. The dilation site was examined following endoscope reinsertion and showed no change.   Office note by Dr Ernestene Kiel 07/03/23 He is able to eat all solid foods without coughing or choking, and not coughing or choking with liquids.  He is eating now, but in June, he was having a hard time swallowing one day and went to the hospital.  Was told he had mini-stroke   Randy Merritt is a 83 y.o. year old male who presents for trouble swallowing. He is accompanied by an adult male.  He has been experiencing difficulty swallowing, progressively worsening over the past few months. This issue affects  all types of food, including solids, liquids, and pills, and has been progressively worsening over the past few months. He used to consume Ensure but now finds it difficult to swallow. He has lost 30 pounds this year due to his swallowing difficulties. An upper GI endoscopy performed in June 2024 did not reveal any tumors or mucosal abnormalities ([facility]). He  does not report any vomiting or regurgitation of undigested food. His breathing and speech are unaffected, although he occasionally experiences a tightening sensation in his throat during the night. He maintains hydration by drinking enough water to take his medication. In the past, he underwent a procedure to stretch his esophagus and is hoping for something similar now.  He reports a constant production of mucus from his throat, the cause of which is unknown. He does not experience heartburn.  He has never smoked and quit drinking alcohol 40 years ago.  Supplemental Information: He has a goiter and is being followed by Dr. Gerrit Merritt. The patient suggests Dr. Gerrit Merritt is awaiting ENT Input on whether or not the goiter could be a cause or component of his dysphagia issue.   Dr Ardine Eng note 07/16/23 Multiple thyroid nodules  Enlarged thyroid  Tracheal deviation   Patient returns for follow-up. 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 Patient also has mild hyperthyroidism based on recent laboratory studies.  Patient continues under evaluation for dysphagia. He has been previously seen by speech-language pathology. He has had a recent upper endoscopy. He is scheduled for a second ENT consultation in October for possible Botox therapy.  Today we discussed the fact that he does need to undergo thyroidectomy for management of his multinodular thyroid goiter with compressive symptoms, tracheal deviation, and borderline hyperthyroidism. However, I would like to allow him to complete his evaluation by ENT prior to scheduling any surgical procedure. Therefore the patient will plan to keep his consult appointment in October. They will contact me once this evaluation is completed and I will review the recommendations from ENT. We will make a decision at that time regarding timing of thyroid surgery.  Patient and his wife are happy with this  plan. We will be in touch following his consultation with ENT in October.   Recent MBS was relatively unremarkable.   ED note from 06/22/23 Patient is 83 year old male who presents with difficulty swallowing. Per chart review, he was recently admitted for similar symptoms last month. He has a history of hyperlipidemia, anxiety, goiter. He reportedly had a few week history of some difficulty swallowing. He was admitted in July and was evaluated by general surgery because it was at first felt to be related to compression of his throat from the goiter. They did not feel that this was the likely cause of his dysphagia. He was seen by gastroenterology and had an EGD. They had felt initially that it was due to external compression although surgery did not agree. He was seen by ENT who did not find an etiology. There was a question that this was related to his anxiety and somatization. He was seen by psychiatry who could not definitively say this but did recommend an appetite suppressant. He was able to increase his p.o. intake while he was in the hospital and was supposed to arrange outpatient follow-up with the specialist. Patient says it seemed to have acted up last night. He feels like he has mucus in his throat that comes up. He thought that it was  better with the Magic mouthwash while he was in the hospital but was not apparently given a prescription for this. He says he took some medicine for constipation last night and that upset his stomach and the spitting up phlegm increased.     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   Perforated bowel (HCC)    Seasonal allergies    Stroke [facility])    showed on Scan per wife no symptoms    Past Surgical History:  Procedure Laterality Date   ANTERIOR CERVICAL  DECOMP/DISCECTOMY FUSION N/A 04/08/2013   Procedure: ANTERIOR CERVICAL DECOMPRESSION/DISCECTOMY FUSION 1 LEVEL;  Surgeon: Karn Cassis, MD;  Location: [facility];  Service: Neurosurgery;  Laterality: N/A;  Cervical five-six Anterior cervical decompression/diskectomy fusion   APPENDECTOMY N/A 10/06/2019   Procedure: APPENDECTOMY;  Surgeon: Berna Bue, MD;  Location: [facility] OR;  Service: General;  Laterality: N/A;   BIOPSY  01/30/2019   Procedure: BIOPSY;  Surgeon: Jeani Hawking, MD;  Location: [facility]COPY;  Service: Endoscopy;;   CERVICAL DISCECTOMY     x2   CHOLECYSTECTOMY     COLON RESECTION SIGMOID N/A 10/06/2019   Procedure: COLON RESECTION SIGMOID;  Surgeon: Berna Bue, MD;  Location: [facility] OR;  Service: General;  Laterality: N/A;   ESOPHAGEAL DILATION  01/30/2019   Procedure: ESOPHAGEAL DILATION;  Surgeon: Jeani Hawking, MD;  Location: [facility]OSCOPY;  Service: Endoscopy;;  Savary   ESOPHAGOGASTRODUODENOSCOPY (EGD) WITH PROPOFOL N/A 01/30/2019   Procedure: ESOPHAGOGASTRODUODENOSCOPY (EGD) WITH PROPOFOL;  Surgeon: Jeani Hawking, MD;  Location: [facility];  Service: Endoscopy;  Laterality: N/A;   gallstone removal     LAPAROTOMY N/A 10/06/2019   Procedure: EXPLORATORY LAPAROTOMY;  Surgeon: Berna Bue, MD;  Location: [facility] OR;  Service: General;  Laterality: N/A;   THYROIDECTOMY N/A 09/27/2023   Procedure: TOTAL THYROIDECTOMY;  Surgeon: Darnell Level, MD;  Location: [facility] ORS;  Service: General;  Laterality: N/A;    Family History  Problem Relation Age of Onset   Healthy Mother    Healthy Father    Hypertension Sister    Hypertension Sister    Diabetes Sister    Hypertension Brother    Thyroid disease Neg Hx     Social History:  reports that he quit smoking about 65 years ago. His smoking use included cigarettes. He started smoking about 66 years ago. He has a 0.3 pack-year smoking history. He has never been exposed to tobacco smoke. He has never used smokeless tobacco.  He reports that he does not drink alcohol and does not use drugs.  Allergies:  Allergies  Allergen Reactions   Alprazolam Other (See Comments)   Bupropion Hcl Er (Xl) Other (See Comments)   Cyclobenzaprine Other (See Comments)   Escitalopram Other (See Comments)   Mirtazapine     Other Reaction(s): dizziness, fall risk, shortness of breath, and tingling sensation in arm   Prednisone Palpitations and Other (See Comments)    Medications: I have reviewed the patient's current medications.  The PMH, PSH, Medications, Allergies, and SH were reviewed and updated.  ROS: Constitutional: Negative for fever, weight loss and weight gain. Cardiovascular: Negative for chest pain and dyspnea on exertion. Respiratory:  Is not experiencing shortness of breath at rest. Gastrointestinal: Negative for nausea and vomiting. Neurological: Negative for headaches. Psychiatric: The patient is not nervous/anxious  There were no vitals taken for this visit.  PHYSICAL EXAM:  Exam: General: Well-developed, well-nourished Communication and Voice: Mildly raspy Respiratory Respiratory effort: Equal inspiration and expiration without stridor Cardiovascular Peripheral Vascular: Warm extremities with equal color/perfusion Eyes: No nystagmus with equal extraocular motion bilaterally Neuro/Psych/Balance: Patient oriented to person, place, and time; Appropriate mood and affect; Gait is intact with no imbalance; Cranial nerves I-XII are intact Head and Face Inspection: Normocephalic and atraumatic without mass or lesion Palpation: Facial skeleton intact without bony stepoffs Salivary Glands: No mass or tenderness Facial Strength: Facial motility symmetric and full bilaterally ENT Pinna: External ear intact and fully developed External canal: Canal is patent with intact skin Tympanic Membrane: Clear and mobile External Nose: No scar or anatomic deformity Internal Nose: Septum is deviated to the left. No  polyp, or purulence. Mucosal edema and erythema present.  Bilateral inferior turbinate hypertrophy.  Lips, Teeth, and gums: Mucosa and teeth intact and viable TMJ: No pain to palpation with full mobility Oral cavity/oropharynx: No erythema or exudate, no lesions present Nasopharynx: No mass or lesion with intact mucosa Hypopharynx: Intact mucosa with pooling of secretions in piriform sinuses worse on the right as well as along the vallecula and postcricoid area Larynx Glottic: Full true vocal cord mobility without lesion or mass Supraglottic: Normal appearing epiglottis and AE folds Interarytenoid Space: Moderate pachydermia edema Subglottic Space: Patent without lesion or edema Neck Neck and Trachea: Midline trachea without mass or lesion Thyroid: No mass or nodularity Lymphatics: No lymphadenopathy  Procedure: Preoperative diagnosis: Chronic dysphagia history of ACDF large thyroid goiter  Postoperative diagnosis:   Same + GERD LPR  Procedure: Flexible fiberoptic laryngoscopy  Surgeon: Ashok Croon, MD  Anesthesia: Topical lidocaine and Afrin Complications: None Condition is stable throughout exam  Indications and consent:  The patient presents to the clinic with Indirect laryngoscopy view was incomplete. Thus it was recommended that they undergo a flexible fiberoptic laryngoscopy. All of the risks, benefits, and potential complications were reviewed with the patient preoperatively and verbal informed consent was obtained.  Procedure: The patient was seated upright in the clinic. Topical lidocaine and Afrin were applied to the nasal cavity. After adequate anesthesia had occurred, I then proceeded to pass the flexible telescope into the nasal cavity. The nasal cavity was patent without rhinorrhea or polyp. The nasopharynx was also patent without mass or lesion. The base of tongue was visualized and was normal. There were no signs of pooling of secretions in the piriform sinuses.  The true vocal folds were mobile bilaterally. There were no signs of glottic or supraglottic mucosal lesion or mass. There was moderate to severe interarytenoid pachydermia and post cricoid edema. The telescope was then slowly withdrawn and the patient tolerated the procedure throughout.  Studies Reviewed:   Modified Barium Swallow  Clinical Impression: Pt presents with a mild dysphagia primarily characterized by residue throughout his pharynx. Pt's suspected prominent osteophytes as well as cervical hardware present from prior ACDF results in signficant pharyngeal narrowing, which contributes to residue in the valleculae, pyriform sinuses, and posterior pharyngeal wall. Pt is independently initiating multiple swallows, which is somewhat effective at clearing this residue. Pt exhibits excellent airway protection, maintaing complete laryngeal vestibule closure during the swallow. He had no penetration/aspiration throughout all trials of thin liquids, nectar thick liquids, honey thick liquids, purees, or solids. The pill was  given with thin liquids and flouro remained on to follow down the esophagus with complete and prompt clearance. Recommend initiating diet of Dys 3 textures with thin liquids and meds given whole with liquids. Will f/u to discuss results of MBS with pt and his wife. Pt may benefit from continued ENT f/u for management of dysphagia symptoms as his oropharyngeal swallow is [facility].   MBS 12/05/22 Pt presents with mild oral and moderate pharyngo-cervical esophageal dysphagia. Pt noted to have secretions retained in pharynx - which likely attribute to his symptoms.  Pt demonstrates piecemealing across boluses- compensatory for his dysphagia.     Pharyngeal swallow marked by decreased laryngeal elevation/closure resulting laryngeal penetration of thin liquids - most notably with sequential swallows.  Pharyngeal retention noted across all consistencies - mostly at pyriform sinuses- which he mostly  senses and decreases with cued liquid or reflexive dry swallows.   Head postures unable to be attempted due to his limited cervical ROM.  Pt with a single episode of significantly delayed swallow with honey via tsp - as barium aggregated at pyriform sinus for several seconds prior to swallow- although pt stated he had swallows.     He did require extra liquids to transit barium tablet into pharynx  from oral cavity- after first liquid swallow not effective. Tablet then stalled at vallecular space - with pt pointing more distal - He required pudding to transit the tablet into esophagus.  Barium tablet than halted below cervical hardware - liquid swallows effective to facilitate clearance. Of note, pt reports these symptoms are consistent with when things "stick".      SLP questions if pt may have some component of obstructive dysphagia from cervical hardware but also suspect potential component of "tight UES" given trace backflow of liquid observed with swallow.   Please see still images cut and pasted into report below.    Assessment/Plan: No diagnosis found.   83 year old male with longstanding symptoms of dysphagia that intermittently worsen, with extensive workup in the past including upper endoscopy in 2020 and again in June 2024 which demonstrated mild narrowing of UES and septal narrowing of LES to 16 mm, without any changes in his symptoms, GI assessment indicative of dysphagia due to cervical osteophyte and large thyroid goiter.  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  He is planned to undergo thyroidectomy with Dr. Gerrit Merritt due to compressive symptoms.  Here for evaluation of dysphagia  Dysphagia -I have reviewed extensive records of prior workup which included to modified barium swallow studies and upper endoscopy by GI x 2 and both concluded  that the likely source of dysphagia is pharyngeal and cervical secondary to presence of ACDF plate and cervical osteophyte immediately above it.  Additionally I reviewed CT of the neck which demonstrated a large thyroid goiter with substernal extension which certainly could contribute to his trouble with swallowing -Exam with evidence of intact movement of bilateral vocal folds, no masses or lesions, but evidence of pooling of secretions along the vallecula and bilateral piriforms worse on the right.  -Although I suspect that his dysphagia symptoms are multifactorial, the mass effect from the thyroid goiter is one of the likely causes of his sx -Additionally there is evidence of osteophyte and cervical hardware that could contribute to trouble with swallowing -Not a candidate for Botox injection into CP/UES muscle due to  the fact that there is no significant CP hypertrophy on MBS -On my review of modified barium swallow studies there is no evidence of UES stenosis or aspiration -Consider returning to swallow therapy -We discussed that surgery for substernal thyroid goiter will likely improve his swallowing function  2. GERD LPR -Continue medical management with famotidine 20 mg at night and pantoprazole 20 mg twice daily  3.  Cervical osteophyte  -Referral to spine surgery to review imaging and consider if he needs/is a surgical candidate for osteophyte removal -discussed with the patient that osteophyte removal has significant risks and its of the procedure have to be considered   -Return after thyroid surgery for repeat evaluation and to consider additional interventions if swallowing issues will not resolve  Update 02/13/2024 Assessment and Plan Assessment & Plan      Ashok Croon, MD Otolaryngology [facility] Phone: [phone] Fax: [phone]    02/13/2024, 7:53 AM

## 2024-02-19 DIAGNOSIS — F411 Generalized anxiety disorder: Secondary | ICD-10-CM | POA: Diagnosis not present

## 2024-02-19 DIAGNOSIS — F5101 Primary insomnia: Secondary | ICD-10-CM | POA: Diagnosis not present

## 2024-02-19 DIAGNOSIS — F331 Major depressive disorder, recurrent, moderate: Secondary | ICD-10-CM | POA: Diagnosis not present

## 2024-02-25 DIAGNOSIS — M79672 Pain in left foot: Secondary | ICD-10-CM | POA: Diagnosis not present

## 2024-02-25 DIAGNOSIS — I739 Peripheral vascular disease, unspecified: Secondary | ICD-10-CM | POA: Diagnosis not present

## 2024-02-25 DIAGNOSIS — B351 Tinea unguium: Secondary | ICD-10-CM | POA: Diagnosis not present

## 2024-02-25 DIAGNOSIS — L600 Ingrowing nail: Secondary | ICD-10-CM | POA: Diagnosis not present

## 2024-02-25 DIAGNOSIS — M79671 Pain in right foot: Secondary | ICD-10-CM | POA: Diagnosis not present

## 2024-02-25 DIAGNOSIS — L84 Corns and callosities: Secondary | ICD-10-CM | POA: Diagnosis not present

## 2024-02-25 DIAGNOSIS — M2012 Hallux valgus (acquired), left foot: Secondary | ICD-10-CM | POA: Diagnosis not present

## 2024-02-29 ENCOUNTER — Other Ambulatory Visit (HOSPITAL_COMMUNITY): Payer: Self-pay

## 2024-03-04 DIAGNOSIS — M542 Cervicalgia: Secondary | ICD-10-CM | POA: Diagnosis not present

## 2024-03-04 DIAGNOSIS — H903 Sensorineural hearing loss, bilateral: Secondary | ICD-10-CM | POA: Diagnosis not present

## 2024-03-05 ENCOUNTER — Emergency Department (HOSPITAL_COMMUNITY)

## 2024-03-05 ENCOUNTER — Emergency Department (HOSPITAL_COMMUNITY)
Admission: EM | Admit: 2024-03-05 | Discharge: 2024-03-06 | Disposition: A | Discharge status: Home / Self Care | Attending: Emergency Medicine | Admitting: Emergency Medicine

## 2024-03-05 ENCOUNTER — Encounter (HOSPITAL_COMMUNITY): Payer: Self-pay

## 2024-03-05 ENCOUNTER — Other Ambulatory Visit: Payer: Self-pay

## 2024-03-05 DIAGNOSIS — Z7982 Long term (current) use of aspirin: Secondary | ICD-10-CM | POA: Diagnosis not present

## 2024-03-05 DIAGNOSIS — R531 Weakness: Secondary | ICD-10-CM | POA: Diagnosis not present

## 2024-03-05 DIAGNOSIS — Z8673 Personal history of transient ischemic attack (TIA), and cerebral infarction without residual deficits: Secondary | ICD-10-CM | POA: Insufficient documentation

## 2024-03-05 DIAGNOSIS — R1310 Dysphagia, unspecified: Secondary | ICD-10-CM | POA: Insufficient documentation

## 2024-03-05 DIAGNOSIS — Z981 Arthrodesis status: Secondary | ICD-10-CM | POA: Diagnosis not present

## 2024-03-05 LAB — CBC WITH DIFFERENTIAL/PLATELET
Abs Immature Granulocytes: 0.01 10*3/uL (ref 0.00–0.07)
Basophils Absolute: 0 10*3/uL (ref 0.0–0.1)
Basophils Relative: 0 %
Eosinophils Absolute: 0 10*3/uL (ref 0.0–0.5)
Eosinophils Relative: 1 %
HCT: 38.9 % — ABNORMAL LOW (ref 39.0–52.0)
Hemoglobin: 12.7 g/dL — ABNORMAL LOW (ref 13.0–17.0)
Immature Granulocytes: 0 %
Lymphocytes Relative: 41 %
Lymphs Abs: 1.8 10*3/uL (ref 0.7–4.0)
MCH: 30.9 pg (ref 26.0–34.0)
MCHC: 32.6 g/dL (ref 30.0–36.0)
MCV: 94.6 fL (ref 80.0–100.0)
Monocytes Absolute: 0.3 10*3/uL (ref 0.1–1.0)
Monocytes Relative: 7 %
Neutro Abs: 2.3 10*3/uL (ref 1.7–7.7)
Neutrophils Relative %: 51 %
Platelets: 170 10*3/uL (ref 150–400)
RBC: 4.11 MIL/uL — ABNORMAL LOW (ref 4.22–5.81)
RDW: 13.1 % (ref 11.5–15.5)
WBC: 4.5 10*3/uL (ref 4.0–10.5)
nRBC: 0 % (ref 0.0–0.2)

## 2024-03-05 LAB — BASIC METABOLIC PANEL WITH GFR
Anion gap: 12 (ref 5–15)
BUN: 14 mg/dL (ref 8–23)
CO2: 27 mmol/L (ref 22–32)
Calcium: 9.4 mg/dL (ref 8.9–10.3)
Chloride: 102 mmol/L (ref 98–111)
Creatinine, Ser: 1.31 mg/dL — ABNORMAL HIGH (ref 0.61–1.24)
GFR, Estimated: 54 mL/min — ABNORMAL LOW (ref >60)
Glucose, Bld: 102 mg/dL — ABNORMAL HIGH (ref 70–99)
Potassium: 4.2 mmol/L (ref 3.5–5.1)
Sodium: 141 mmol/L (ref 135–145)

## 2024-03-05 LAB — TSH: TSH: 0.415 u[IU]/mL (ref 0.350–4.500)

## 2024-03-05 MED ORDER — IOHEXOL 350 MG/ML SOLN
75.0000 mL | Freq: Once | INTRAVENOUS | Status: AC | PRN
  Administered 2024-03-05: 75 mL via INTRAVENOUS

## 2024-03-05 MED ORDER — SODIUM CHLORIDE 0.9 % IV BOLUS
1000.0000 mL | Freq: Once | INTRAVENOUS | Status: AC
  Administered 2024-03-05: 1000 mL via INTRAVENOUS

## 2024-03-05 MED ORDER — MORPHINE SULFATE (PF) 4 MG/ML IV SOLN
4.0000 mg | Freq: Once | INTRAVENOUS | Status: AC
  Administered 2024-03-05: 4 mg via INTRAVENOUS
  Filled 2024-03-05: qty 1

## 2024-03-05 MED ORDER — LIDOCAINE VISCOUS HCL 2 % MT SOLN
15.0000 mL | Freq: Once | OROMUCOSAL | Status: AC
  Administered 2024-03-05: 15 mL via OROMUCOSAL
  Filled 2024-03-05: qty 15

## 2024-03-05 NOTE — ED Triage Notes (Signed)
 Patients reporting long standing issue with swallowing.  Patient reports it now hurts so bad and can barely drink water .  Hx of goiter.

## 2024-03-05 NOTE — ED Provider Triage Note (Signed)
 Emergency Medicine Provider Triage Evaluation Note  Randy Merritt , a 83 y.o. male  was evaluated in triage.  Pt complains of dysphagia. Endorse ongoing difficulty swallowing since he had surgery for a goiter more than a year ago.  Now having increase trouble swallowing and throat pain.  No fever, cp, sob  Review of Systems  Positive: As above Negative: As above  Physical Exam  BP (!) 140/87 (BP Location: Right Arm)   Pulse 84   Temp 97.8 F (36.6 C)   Resp 20   Ht 5\' 9"  (1.753 m)   Wt 64.9 kg   SpO2 100%   BMI 21.12 kg/m  Gen:   Awake, no distress   Resp:  Normal effort  MSK:   Moves extremities without difficulty  Other:    Medical Decision Making  Medically screening exam initiated at 9:10 PM.  Appropriate orders placed.  Kristy Catoe was informed that the remainder of the evaluation will be completed by another provider, this initial triage assessment does not replace that evaluation, and the importance of remaining in the ED until their evaluation is complete.     Debbra Fairy, PA-C 03/05/24 2111

## 2024-03-05 NOTE — ED Provider Notes (Signed)
 [place] EMERGENCY DEPARTMENT AT [facility] Provider Note   CSN: [id number] Arrival date & time: 03/05/24  1834     History {Add pertinent medical, surgical, social history, OB history to HPI:1} Chief Complaint  Patient presents with   Dysphagia    Markeise Mathews is a 83 y.o. male.  Patient with history of dysphagia presents with progressively worsening pain with swallowing. He denies fever, vomiting, SOB or difficulty breathing. His wife reports the problem started in January after a thyroidectomy and since has had left sided throat pain extending to ear. He has seen ENT as well as GI for same. He comes in tonight for progressive, severe pain, now spitting out his saliva.   The history is provided by the patient. No language interpreter was used.       Home Medications Prior to Admission medications   Medication Sig Start Date End Date Taking? Authorizing Provider  acetaminophen  (TYLENOL ) 500 MG tablet Take 2 tablets (1,000 mg total) by mouth every 8 (eight) hours. Patient taking differently: Take 1,000 mg by mouth every 8 (eight) hours as needed for moderate pain (pain score 4-6). 10/15/19   Monetta Angst, PA-C  ALPRAZolam  (XANAX ) 0.25 MG tablet Take 0.25 mg by mouth in the morning and at bedtime.    [provider]  aspirin  EC 81 MG tablet Take 1 tablet (81 mg total) by mouth daily. Swallow whole. 06/11/23 06/10/24  Kraig Peru, MD  calcium  carbonate (TUMS) 500 MG chewable tablet Chew 2 tablets (400 mg of elemental calcium  total) by mouth 3 (three) times daily. 09/27/23   Oralee Billow, MD  citalopram  (CELEXA ) 10 MG tablet Take 10 mg by mouth daily.    [provider]  docusate sodium  (COLACE) 100 MG capsule Take 1 capsule (100 mg total) by mouth 2 (two) times daily. 06/11/23   Kraig Peru, MD  famotidine  (PEPCID ) 20 MG tablet Take 1 tablet (20 mg total) by mouth 2 (two) times daily. Patient taking differently: Take 20 mg by mouth daily.  08/15/23   Soldatova, Liuba, MD  feeding supplement (ENSURE ENLIVE / ENSURE PLUS) LIQD Take 237 mLs by mouth 3 (three) times daily between meals. Patient taking differently: Take 237 mLs by mouth 2 (two) times daily between meals. 06/11/23   Kraig Peru, MD  finasteride  (PROSCAR ) 5 MG tablet Take 5 mg by mouth daily.    [provider]  fluticasone  (FLONASE ) 50 MCG/ACT nasal spray Place 2 sprays into both nostrils daily as needed for allergies.    [provider]  levothyroxine  (SYNTHROID ) 100 MCG tablet Take 1 tablet (100 mcg total) by mouth daily before breakfast. 09/27/23   Oralee Billow, MD  lidocaine  (XYLOCAINE ) 2 % solution Use as directed 15 mLs in the mouth or throat as needed for mouth pain. 01/10/24   Albertus Hughs, DO  morphine  (MSIR) 15 MG tablet Take 0.5 tablets (7.5 mg total) by mouth every 4 (four) hours as needed for severe pain (pain score 7-10). 01/10/24   Albertus Hughs, DO  ondansetron  (ZOFRAN ) 4 MG tablet Take 1 tablet (4 mg total) by mouth every 6 (six) hours as needed for nausea. 06/11/23   Kraig Peru, MD  ondansetron  (ZOFRAN -ODT) 4 MG disintegrating tablet 4mg  ODT q4 hours prn nausea/vomit 01/10/24   Floyd, Dan, DO  pantoprazole  (PROTONIX ) 40 MG tablet Take 1 tablet (40 mg total) by mouth 2 (two) times daily before a meal. 06/11/23   Kraig Peru, MD  QUEtiapine  (SEROQUEL )  25 MG tablet Take 1 tablet (25 mg total) by mouth at bedtime. 06/11/23   Kraig Peru, MD  rosuvastatin  (CRESTOR ) 20 MG tablet Take 20 mg by mouth daily. 05/26/21   [provider]  timolol  (TIMOPTIC ) 0.5 % ophthalmic solution Place 1 drop into both eyes daily.    [provider]  traMADol  (ULTRAM ) 50 MG tablet Take 1 tablet (50 mg total) by mouth every 6 (six) hours as needed for moderate pain (pain score 4-6). 09/27/23   Oralee Billow, MD      Allergies    Alprazolam , Bupropion  hcl er (xl), Cyclobenzaprine, Escitalopram, Mirtazapine, and Prednisone     Review of Systems    Review of Systems  Physical Exam Updated Vital Signs BP (!) 149/83 (BP Location: Right Arm)   Pulse 79   Temp 97.8 F (36.6 C)   Resp 18   Ht 5\' 9"  (1.753 m)   Wt 64.9 kg   SpO2 99%   BMI 21.12 kg/m  Physical Exam Vitals and nursing note reviewed.  Constitutional:      Appearance: He is well-developed.  HENT:     Mouth/Throat:     Comments: Oropharynx benign without swelling.  Neck:     Comments: No swelling about the neck. No erythema or mass.  Pulmonary:     Effort: Pulmonary effort is normal.  Musculoskeletal:        General: Normal range of motion.     Cervical back: Normal range of motion.  Skin:    General: Skin is warm and dry.  Neurological:     Mental Status: He is alert and oriented to person, place, and time.     ED Results / Procedures / Treatments   Labs (all labs ordered are listed, but only abnormal results are displayed) Labs Reviewed  BASIC METABOLIC PANEL WITH GFR - Abnormal; Notable for the following components:      Result Value   Glucose, Bld 102 (*)    Creatinine, Ser 1.31 (*)    GFR, Estimated 54 (*)    All other components within normal limits  CBC WITH DIFFERENTIAL/PLATELET - Abnormal; Notable for the following components:   RBC 4.11 (*)    Hemoglobin 12.7 (*)    HCT 38.9 (*)    All other components within normal limits  TSH   Results for orders placed or performed during the hospital encounter of 03/05/24  Basic metabolic panel   Collection Time: 03/05/24  9:33 PM  Result Value Ref Range   Sodium 141 135 - 145 mmol/L   Potassium 4.2 3.5 - 5.1 mmol/L   Chloride 102 98 - 111 mmol/L   CO2 27 22 - 32 mmol/L   Glucose, Bld 102 (H) 70 - 99 mg/dL   BUN 14 8 - 23 mg/dL   Creatinine, Ser 1.61 (H) 0.61 - 1.24 mg/dL   Calcium  9.4 8.9 - 10.3 mg/dL   GFR, Estimated 54 (L) >60 mL/min   Anion gap 12 5 - 15  CBC with Differential   Collection Time: 03/05/24  9:33 PM  Result Value Ref Range   WBC 4.5 4.0 - 10.5 K/uL   RBC 4.11 (L) 4.22  - 5.81 MIL/uL   Hemoglobin 12.7 (L) 13.0 - 17.0 g/dL   HCT 09.6 (L) 04.5 - 40.9 %   MCV 94.6 80.0 - 100.0 fL   MCH 30.9 26.0 - 34.0 pg   MCHC 32.6 30.0 - 36.0 g/dL   RDW 81.1 91.4 - 78.2 %  Platelets 170 150 - 400 K/uL   nRBC 0.0 0.0 - 0.2 %   Neutrophils Relative % 51 %   Neutro Abs 2.3 1.7 - 7.7 K/uL   Lymphocytes Relative 41 %   Lymphs Abs 1.8 0.7 - 4.0 K/uL   Monocytes Relative 7 %   Monocytes Absolute 0.3 0.1 - 1.0 K/uL   Eosinophils Relative 1 %   Eosinophils Absolute 0.0 0.0 - 0.5 K/uL   Basophils Relative 0 %   Basophils Absolute 0.0 0.0 - 0.1 K/uL   Immature Granulocytes 0 %   Abs Immature Granulocytes 0.01 0.00 - 0.07 K/uL  TSH   Collection Time: 03/05/24  9:33 PM  Result Value Ref Range   TSH 0.415 0.350 - 4.500 uIU/mL    EKG None  Radiology No results found.  Procedures Procedures  {Document cardiac monitor, telemetry assessment procedure when appropriate:1}  Medications Ordered in ED Medications  lidocaine  (XYLOCAINE ) 2 % viscous mouth solution 15 mL (has no administration in time range)    ED Course/ Medical Decision Making/ A&P   {   Click here for ABCD2, HEART and other calculatorsREFRESH Note before signing :1}                              Medical Decision Making This patient presents to the ED for concern of dysphagia, this involves an extensive number of treatment options, and is a complaint that carries with it a high risk of complications and morbidity.  The differential diagnosis includes mass, abscess/infection,    Co morbidities that complicate the patient evaluation  HLD, anxiety, arthritis, goiter s/p thyroidectomy, stroke   Additional history obtained:  Additional history and/or information obtained from chart review, notable for ***   Lab Tests:  I Ordered, and personally interpreted labs.  The pertinent results include:  WBC 4.5, hgb 12.7: TSH 0.415; Cr 1.31    Imaging Studies ordered:  I ordered imaging studies  including *** I independently visualized and interpreted imaging which showed *** I agree with the radiologist interpretation   Cardiac Monitoring:  The patient was maintained on a cardiac monitor.  I personally viewed and interpreted the cardiac monitored which showed an underlying rhythm of: n/a   Medicines ordered and prescription drug management:  I ordered medication including morphine    for pain Reevaluation of the patient after these medicines showed that the patient improved I have reviewed the patients home medicines and have made adjustments as needed   Test Considered:  N/a   Critical Interventions:  N/a   Consultations Obtained:  I requested consultation with the ***,  and discussed lab and imaging findings as well as pertinent plan - they recommend: ***   Problem List / ED Course:  ***   Reevaluation:  After the interventions noted above, I reevaluated the patient and found that they have :{resolved/improved/worsened:23923::"improved"}   Social Determinants of Health:  ***   Disposition:  After consideration of the diagnostic results and the patients response to treatment, I feel that the patient would benefit from ***.   Risk Prescription drug management.   ***  {Document critical care time when appropriate:1} {Document review of labs and clinical decision tools ie heart score, Chads2Vasc2 etc:1}  {Document your independent review of radiology images, and any outside records:1} {Document your discussion with family members, caretakers, and with consultants:1} {Document social determinants of health affecting pt's care:1} {Document your decision making why or why not admission,  treatments were needed:1} Final Clinical Impression(s) / ED Diagnoses Final diagnoses:  None    Rx / DC Orders ED Discharge Orders     None

## 2024-03-05 NOTE — ED Notes (Signed)
 Pt is saying that the lidocaine  is making his throat feel worse.

## 2024-03-06 MED ORDER — OXYCODONE HCL 5 MG/5ML PO SOLN: 5.0000 mg | ORAL | 0 refills | Status: DC | PRN

## 2024-03-06 MED ORDER — MORPHINE SULFATE (PF) 4 MG/ML IV SOLN
4.0000 mg | Freq: Once | INTRAVENOUS | Status: AC
  Administered 2024-03-06: 4 mg via INTRAVENOUS
  Filled 2024-03-06: qty 1

## 2024-03-06 NOTE — Discharge Instructions (Signed)
 Please get in touch with your doctors (ENT, primary care, even surgeon) to continue outpatient investigation as to the cause of painful swallowing. Use oxycodone  liquid for pain as prescribed.   Return to the ED if you develop any high fever, severe and uncontrolled pain, bloody discharge from the throat or for new concern.

## 2024-03-10 DIAGNOSIS — G8929 Other chronic pain: Secondary | ICD-10-CM | POA: Diagnosis not present

## 2024-03-10 DIAGNOSIS — K219 Gastro-esophageal reflux disease without esophagitis: Secondary | ICD-10-CM | POA: Diagnosis not present

## 2024-03-10 DIAGNOSIS — F411 Generalized anxiety disorder: Secondary | ICD-10-CM | POA: Diagnosis not present

## 2024-03-10 DIAGNOSIS — F0394 Unspecified dementia, unspecified severity, with anxiety: Secondary | ICD-10-CM | POA: Diagnosis not present

## 2024-03-10 DIAGNOSIS — R030 Elevated blood-pressure reading, without diagnosis of hypertension: Secondary | ICD-10-CM | POA: Diagnosis not present

## 2024-03-10 DIAGNOSIS — N182 Chronic kidney disease, stage 2 (mild): Secondary | ICD-10-CM | POA: Diagnosis not present

## 2024-03-10 DIAGNOSIS — K5900 Constipation, unspecified: Secondary | ICD-10-CM | POA: Diagnosis not present

## 2024-03-10 DIAGNOSIS — Z888 Allergy status to other drugs, medicaments and biological substances status: Secondary | ICD-10-CM | POA: Diagnosis not present

## 2024-03-10 DIAGNOSIS — I739 Peripheral vascular disease, unspecified: Secondary | ICD-10-CM | POA: Diagnosis not present

## 2024-03-10 DIAGNOSIS — Z8673 Personal history of transient ischemic attack (TIA), and cerebral infarction without residual deficits: Secondary | ICD-10-CM | POA: Diagnosis not present

## 2024-03-10 DIAGNOSIS — E785 Hyperlipidemia, unspecified: Secondary | ICD-10-CM | POA: Diagnosis not present

## 2024-03-10 DIAGNOSIS — Z7982 Long term (current) use of aspirin: Secondary | ICD-10-CM | POA: Diagnosis not present

## 2024-03-20 ENCOUNTER — Emergency Department (HOSPITAL_COMMUNITY)
Admission: EM | Admit: 2024-03-20 | Discharge: 2024-03-20 | Disposition: A | Discharge status: Home / Self Care | Attending: Emergency Medicine | Admitting: Emergency Medicine

## 2024-03-20 ENCOUNTER — Emergency Department (HOSPITAL_COMMUNITY)

## 2024-03-20 DIAGNOSIS — M542 Cervicalgia: Secondary | ICD-10-CM | POA: Diagnosis not present

## 2024-03-20 DIAGNOSIS — F17200 Nicotine dependence, unspecified, uncomplicated: Secondary | ICD-10-CM | POA: Diagnosis not present

## 2024-03-20 DIAGNOSIS — I6782 Cerebral ischemia: Secondary | ICD-10-CM | POA: Diagnosis not present

## 2024-03-20 DIAGNOSIS — Z7982 Long term (current) use of aspirin: Secondary | ICD-10-CM | POA: Insufficient documentation

## 2024-03-20 DIAGNOSIS — M2578 Osteophyte, vertebrae: Secondary | ICD-10-CM | POA: Diagnosis not present

## 2024-03-20 DIAGNOSIS — M19012 Primary osteoarthritis, left shoulder: Secondary | ICD-10-CM | POA: Diagnosis not present

## 2024-03-20 DIAGNOSIS — R519 Headache, unspecified: Secondary | ICD-10-CM | POA: Diagnosis not present

## 2024-03-20 DIAGNOSIS — M25512 Pain in left shoulder: Secondary | ICD-10-CM | POA: Insufficient documentation

## 2024-03-20 DIAGNOSIS — N183 Chronic kidney disease, stage 3 unspecified: Secondary | ICD-10-CM | POA: Diagnosis not present

## 2024-03-20 DIAGNOSIS — R4789 Other speech disturbances: Secondary | ICD-10-CM

## 2024-03-20 DIAGNOSIS — G319 Degenerative disease of nervous system, unspecified: Secondary | ICD-10-CM | POA: Diagnosis not present

## 2024-03-20 DIAGNOSIS — S0990XA Unspecified injury of head, initial encounter: Secondary | ICD-10-CM

## 2024-03-20 LAB — CBC WITH DIFFERENTIAL/PLATELET
Abs Immature Granulocytes: 0.01 10*3/uL (ref 0.00–0.07)
Basophils Absolute: 0 10*3/uL (ref 0.0–0.1)
Basophils Relative: 0 %
Eosinophils Absolute: 0 10*3/uL (ref 0.0–0.5)
Eosinophils Relative: 1 %
HCT: 35.5 % — ABNORMAL LOW (ref 39.0–52.0)
Hemoglobin: 11.7 g/dL — ABNORMAL LOW (ref 13.0–17.0)
Immature Granulocytes: 0 %
Lymphocytes Relative: 36 %
Lymphs Abs: 1.3 10*3/uL (ref 0.7–4.0)
MCH: 31.8 pg (ref 26.0–34.0)
MCHC: 33 g/dL (ref 30.0–36.0)
MCV: 96.5 fL (ref 80.0–100.0)
Monocytes Absolute: 0.2 10*3/uL (ref 0.1–1.0)
Monocytes Relative: 6 %
Neutro Abs: 2 10*3/uL (ref 1.7–7.7)
Neutrophils Relative %: 57 %
Platelets: 165 10*3/uL (ref 150–400)
RBC: 3.68 MIL/uL — ABNORMAL LOW (ref 4.22–5.81)
RDW: 13.3 % (ref 11.5–15.5)
WBC: 3.5 10*3/uL — ABNORMAL LOW (ref 4.0–10.5)
nRBC: 0 % (ref 0.0–0.2)

## 2024-03-20 LAB — COMPREHENSIVE METABOLIC PANEL WITH GFR
ALT: 16 U/L (ref 0–44)
AST: 25 U/L (ref 15–41)
Albumin: 4.2 g/dL (ref 3.5–5.0)
Alkaline Phosphatase: 111 U/L (ref 38–126)
Anion gap: 11 (ref 5–15)
BUN: 10 mg/dL (ref 8–23)
CO2: 25 mmol/L (ref 22–32)
Calcium: 9.3 mg/dL (ref 8.9–10.3)
Chloride: 105 mmol/L (ref 98–111)
Creatinine, Ser: 1.03 mg/dL (ref 0.61–1.24)
GFR, Estimated: >60 mL/min (ref >60)
Glucose, Bld: 87 mg/dL (ref 70–99)
Potassium: 4.2 mmol/L (ref 3.5–5.1)
Sodium: 141 mmol/L (ref 135–145)
Total Bilirubin: 0.5 mg/dL (ref 0.0–1.2)
Total Protein: 6.8 g/dL (ref 6.5–8.1)

## 2024-03-20 LAB — URINALYSIS, ROUTINE W REFLEX MICROSCOPIC
Bilirubin Urine: NEGATIVE
Glucose, UA: NEGATIVE mg/dL
Hgb urine dipstick: NEGATIVE
Ketones, ur: NEGATIVE mg/dL
Leukocytes,Ua: NEGATIVE
Nitrite: NEGATIVE
Protein, ur: NEGATIVE mg/dL
Specific Gravity, Urine: 1.002 — ABNORMAL LOW (ref 1.005–1.030)
pH: 7 (ref 5.0–8.0)

## 2024-03-20 NOTE — Discharge Instructions (Signed)
 Your imaging of your head was reassuring - no signs of stroke, just age related changes to the brain. Follow up with your PCP in the next several days for reevaluation.   Get help right away if: You have sudden: Headache that is very bad. Vomiting that does not stop. Changes in the size of one of your pupils. Pupils are the black centers of your eyes. Changes in how you see (vision). More confusion or more grumpy moods. You have a seizure. Your symptoms get worse. You have a clear or bloody fluid coming from your nose or ears.
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
## 2024-03-31 DIAGNOSIS — D696 Thrombocytopenia, unspecified: Secondary | ICD-10-CM | POA: Diagnosis not present

## 2024-03-31 DIAGNOSIS — E890 Postprocedural hypothyroidism: Secondary | ICD-10-CM | POA: Diagnosis not present

## 2024-03-31 DIAGNOSIS — N1831 Chronic kidney disease, stage 3a: Secondary | ICD-10-CM | POA: Diagnosis not present

## 2024-03-31 DIAGNOSIS — E559 Vitamin D deficiency, unspecified: Secondary | ICD-10-CM | POA: Diagnosis not present

## 2024-04-03 DIAGNOSIS — I130 Hypertensive heart and chronic kidney disease with heart failure and stage 1 through stage 4 chronic kidney disease, or unspecified chronic kidney disease: Secondary | ICD-10-CM | POA: Diagnosis not present

## 2024-04-03 DIAGNOSIS — I509 Heart failure, unspecified: Secondary | ICD-10-CM | POA: Diagnosis not present

## 2024-04-03 DIAGNOSIS — R1310 Dysphagia, unspecified: Secondary | ICD-10-CM | POA: Diagnosis not present

## 2024-04-03 DIAGNOSIS — F419 Anxiety disorder, unspecified: Secondary | ICD-10-CM | POA: Diagnosis not present

## 2024-04-03 DIAGNOSIS — N1831 Chronic kidney disease, stage 3a: Secondary | ICD-10-CM | POA: Diagnosis not present

## 2024-04-03 DIAGNOSIS — D631 Anemia in chronic kidney disease: Secondary | ICD-10-CM | POA: Diagnosis not present

## 2024-04-03 DIAGNOSIS — F5105 Insomnia due to other mental disorder: Secondary | ICD-10-CM | POA: Diagnosis not present

## 2024-04-03 DIAGNOSIS — R6884 Jaw pain: Secondary | ICD-10-CM | POA: Diagnosis not present

## 2024-04-03 DIAGNOSIS — M2420 Disorder of ligament, unspecified site: Secondary | ICD-10-CM | POA: Diagnosis not present

## 2024-04-15 DIAGNOSIS — F331 Major depressive disorder, recurrent, moderate: Secondary | ICD-10-CM | POA: Diagnosis not present

## 2024-04-15 DIAGNOSIS — F411 Generalized anxiety disorder: Secondary | ICD-10-CM | POA: Diagnosis not present

## 2024-04-15 DIAGNOSIS — F5101 Primary insomnia: Secondary | ICD-10-CM | POA: Diagnosis not present

## 2024-04-28 DIAGNOSIS — R1312 Dysphagia, oropharyngeal phase: Secondary | ICD-10-CM | POA: Diagnosis not present

## 2024-04-28 DIAGNOSIS — R1314 Dysphagia, pharyngoesophageal phase: Secondary | ICD-10-CM | POA: Diagnosis not present

## 2024-04-28 DIAGNOSIS — M2578 Osteophyte, vertebrae: Secondary | ICD-10-CM | POA: Diagnosis not present

## 2024-04-28 DIAGNOSIS — M2420 Disorder of ligament, unspecified site: Secondary | ICD-10-CM | POA: Diagnosis not present

## 2024-04-28 DIAGNOSIS — Z9889 Other specified postprocedural states: Secondary | ICD-10-CM | POA: Diagnosis not present

## 2024-04-28 DIAGNOSIS — M542 Cervicalgia: Secondary | ICD-10-CM | POA: Diagnosis not present

## 2024-04-28 DIAGNOSIS — R5381 Other malaise: Secondary | ICD-10-CM | POA: Diagnosis not present

## 2024-05-06 ENCOUNTER — Other Ambulatory Visit: Payer: Self-pay

## 2024-05-06 DIAGNOSIS — R1314 Dysphagia, pharyngoesophageal phase: Secondary | ICD-10-CM

## 2024-05-06 DIAGNOSIS — K219 Gastro-esophageal reflux disease without esophagitis: Secondary | ICD-10-CM | POA: Diagnosis not present

## 2024-05-06 DIAGNOSIS — R1312 Dysphagia, oropharyngeal phase: Secondary | ICD-10-CM | POA: Diagnosis not present

## 2024-05-06 DIAGNOSIS — R1310 Dysphagia, unspecified: Secondary | ICD-10-CM

## 2024-05-12 ENCOUNTER — Ambulatory Visit
Admission: RE | Admit: 2024-05-12 | Discharge: 2024-05-12 | Disposition: A | Discharge status: Home / Self Care | Source: Ambulatory Visit

## 2024-05-12 ENCOUNTER — Other Ambulatory Visit: Payer: Self-pay

## 2024-05-12 DIAGNOSIS — R1310 Dysphagia, unspecified: Secondary | ICD-10-CM

## 2024-05-12 DIAGNOSIS — K219 Gastro-esophageal reflux disease without esophagitis: Secondary | ICD-10-CM

## 2024-05-12 DIAGNOSIS — R1314 Dysphagia, pharyngoesophageal phase: Secondary | ICD-10-CM

## 2024-05-13 DIAGNOSIS — F5101 Primary insomnia: Secondary | ICD-10-CM | POA: Diagnosis not present

## 2024-05-13 DIAGNOSIS — F331 Major depressive disorder, recurrent, moderate: Secondary | ICD-10-CM | POA: Diagnosis not present

## 2024-05-13 DIAGNOSIS — F411 Generalized anxiety disorder: Secondary | ICD-10-CM | POA: Diagnosis not present

## 2024-05-22 ENCOUNTER — Other Ambulatory Visit: Payer: Self-pay

## 2024-05-22 ENCOUNTER — Encounter (HOSPITAL_COMMUNITY): Payer: Self-pay

## 2024-05-22 ENCOUNTER — Telehealth (HOSPITAL_COMMUNITY): Payer: Self-pay | Admitting: *Deleted

## 2024-05-22 ENCOUNTER — Emergency Department (HOSPITAL_COMMUNITY)
Admission: EM | Admit: 2024-05-22 | Discharge: 2024-05-22 | Disposition: A | Discharge status: Home / Self Care | Attending: Emergency Medicine | Admitting: Emergency Medicine

## 2024-05-22 DIAGNOSIS — R1310 Dysphagia, unspecified: Secondary | ICD-10-CM | POA: Diagnosis not present

## 2024-05-22 DIAGNOSIS — D72819 Decreased white blood cell count, unspecified: Secondary | ICD-10-CM | POA: Diagnosis not present

## 2024-05-22 DIAGNOSIS — R070 Pain in throat: Secondary | ICD-10-CM | POA: Diagnosis present

## 2024-05-22 DIAGNOSIS — Z7982 Long term (current) use of aspirin: Secondary | ICD-10-CM | POA: Insufficient documentation

## 2024-05-22 DIAGNOSIS — R9431 Abnormal electrocardiogram [ECG] [EKG]: Secondary | ICD-10-CM | POA: Diagnosis not present

## 2024-05-22 LAB — BASIC METABOLIC PANEL WITH GFR
Anion gap: 9 (ref 5–15)
BUN: 11 mg/dL (ref 8–23)
CO2: 28 mmol/L (ref 22–32)
Calcium: 9.7 mg/dL (ref 8.9–10.3)
Chloride: 105 mmol/L (ref 98–111)
Creatinine, Ser: 1.24 mg/dL (ref 0.61–1.24)
GFR, Estimated: 58 mL/min — ABNORMAL LOW (ref >60)
Glucose, Bld: 91 mg/dL (ref 70–99)
Potassium: 4.1 mmol/L (ref 3.5–5.1)
Sodium: 142 mmol/L (ref 135–145)

## 2024-05-22 LAB — CBC WITH DIFFERENTIAL/PLATELET
Abs Immature Granulocytes: 0.02 K/uL (ref 0.00–0.07)
Basophils Absolute: 0 K/uL (ref 0.0–0.1)
Basophils Relative: 0 %
Eosinophils Absolute: 0.1 K/uL (ref 0.0–0.5)
Eosinophils Relative: 3 %
HCT: 39.9 % (ref 39.0–52.0)
Hemoglobin: 13.1 g/dL (ref 13.0–17.0)
Immature Granulocytes: 1 %
Lymphocytes Relative: 42 %
Lymphs Abs: 1.3 K/uL (ref 0.7–4.0)
MCH: 31 pg (ref 26.0–34.0)
MCHC: 32.8 g/dL (ref 30.0–36.0)
MCV: 94.5 fL (ref 80.0–100.0)
Monocytes Absolute: 0.3 K/uL (ref 0.1–1.0)
Monocytes Relative: 9 %
Neutro Abs: 1.4 K/uL — ABNORMAL LOW (ref 1.7–7.7)
Neutrophils Relative %: 45 %
Platelets: 149 K/uL — ABNORMAL LOW (ref 150–400)
RBC: 4.22 MIL/uL (ref 4.22–5.81)
RDW: 12.7 % (ref 11.5–15.5)
WBC: 3.2 K/uL — ABNORMAL LOW (ref 4.0–10.5)
nRBC: 0 % (ref 0.0–0.2)

## 2024-05-22 MED ORDER — LIDOCAINE VISCOUS HCL 2 % MT SOLN: 15.0000 mL | OROMUCOSAL | 0 refills | Status: AC | PRN

## 2024-05-22 MED ORDER — LIDOCAINE VISCOUS HCL 2 % MT SOLN
15.0000 mL | Freq: Once | OROMUCOSAL | Status: AC
  Administered 2024-05-22: 15 mL via OROMUCOSAL
  Filled 2024-05-22: qty 15

## 2024-05-22 NOTE — ED Triage Notes (Signed)
 Pt here for left sided neck pain. Pt had scar tissue removal in November. C/O unable to eat due to pain. Pt able to talk in triage.

## 2024-05-22 NOTE — Telephone Encounter (Signed)
 Attempted to schedule swallow study. Left VM at [phone] with request for call back. (AH)

## 2024-05-22 NOTE — Discharge Instructions (Addendum)
 Please follow-up closely with your ENT specialist for further evaluation and management of your condition.  You may use viscous lidocaine  including gargle and swallow as needed for sore throat

## 2024-05-22 NOTE — ED Provider Notes (Signed)
 [place] [facility] AT [facility] Provider Note   CSN: [id number] Arrival date & time: 05/22/24  1003     Patient presents with: Neck Pain   Sahid Borba is a 83 y.o. male.   The history is provided by the patient and medical records. No language interpreter was used.  Neck Pain    83 year old male with history of prior stroke, anxiety, depression, hypercholesterolemia, esophageal dysmotility, thyroidectomy, cervical fusion presenting with complaint of throat pain.  Patient report he has had trouble swallowing and throat pain ongoing for more than a year.  He has to spit up his mucus on a regular basis because he feels like he cannot swallow it adequately.  He complaining of throbbing pain to the left side of his head as well.  He states he is frustrated with his symptoms as he has been seen and evaluated multiple times by different provider without any definitive diagnosis.  He feel he is unable to fully express his concern without the presence of his wife who usually talks for him and knows his situation well.  He denies any new symptoms, just a continuation of his difficulty swallowing.  States he has been losing weight because he is not eating or drinking much.  He has trouble swallowing both liquid and solid.  Prior to Admission medications   Medication Sig Start Date End Date Taking? Authorizing Provider  acetaminophen  (TYLENOL ) 500 MG tablet Take 2 tablets (1,000 mg total) by mouth every 8 (eight) hours. Patient taking differently: Take 1,000 mg by mouth every 8 (eight) hours as needed for moderate pain (pain score 4-6). 10/15/19   Tonnie George, PA-C  ALPRAZolam  (XANAX ) 0.25 MG tablet Take 0.25 mg by mouth in the morning and at bedtime.    [provider]  aspirin  EC 81 MG tablet Take 1 tablet (81 mg total) by mouth daily. Swallow whole. 06/11/23 06/10/24  Tobie Yetta HERO, MD  calcium  carbonate (TUMS) 500 MG chewable tablet Chew 2 tablets (400 mg  of elemental calcium  total) by mouth 3 (three) times daily. 09/27/23   Eletha Boas, MD  citalopram  (CELEXA ) 10 MG tablet Take 10 mg by mouth daily.    [provider]  docusate sodium  (COLACE) 100 MG capsule Take 1 capsule (100 mg total) by mouth 2 (two) times daily. 06/11/23   Tobie Yetta HERO, MD  famotidine  (PEPCID ) 20 MG tablet Take 1 tablet (20 mg total) by mouth 2 (two) times daily. Patient taking differently: Take 20 mg by mouth daily. 08/15/23   Soldatova, [place], MD  feeding supplement (ENSURE ENLIVE / ENSURE PLUS) LIQD Take 237 mLs by mouth 3 (three) times daily between meals. Patient taking differently: Take 237 mLs by mouth 2 (two) times daily between meals. 06/11/23   Tobie Yetta HERO, MD  finasteride  (PROSCAR ) 5 MG tablet Take 5 mg by mouth daily.    [provider]  fluticasone  (FLONASE ) 50 MCG/ACT nasal spray Place 2 sprays into both nostrils daily as needed for allergies.    [provider]  levothyroxine  (SYNTHROID ) 100 MCG tablet Take 1 tablet (100 mcg total) by mouth daily before breakfast. 09/27/23   Eletha Boas, MD  lidocaine  (XYLOCAINE ) 2 % solution Use as directed 15 mLs in the mouth or throat as needed for mouth pain. 01/10/24   Emil Share, DO  morphine  (MSIR) 15 MG tablet Take 0.5 tablets (7.5 mg total) by mouth every 4 (four) hours as needed for severe pain (pain score 7-10). 01/10/24  Emil Share, DO  ondansetron  (ZOFRAN ) 4 MG tablet Take 1 tablet (4 mg total) by mouth every 6 (six) hours as needed for nausea. 06/11/23   Tobie Yetta HERO, MD  ondansetron  (ZOFRAN -ODT) 4 MG disintegrating tablet 4mg  ODT q4 hours prn nausea/vomit 01/10/24   Floyd, Dan, DO  oxyCODONE  (ROXICODONE ) 5 MG/5ML solution Take 5 mLs (5 mg total) by mouth every 4 (four) hours as needed for severe pain (pain score 7-10) or moderate pain (pain score 4-6). 03/06/24   Odell Balls, PA-C  pantoprazole  (PROTONIX ) 40 MG tablet Take 1 tablet (40 mg total) by mouth 2 (two) times daily before a  meal. 06/11/23   Tobie Yetta HERO, MD  QUEtiapine  (SEROQUEL ) 25 MG tablet Take 1 tablet (25 mg total) by mouth at bedtime. 06/11/23   Tobie Yetta HERO, MD  rosuvastatin  (CRESTOR ) 20 MG tablet Take 20 mg by mouth daily. 05/26/21   [provider]  timolol  (TIMOPTIC ) 0.5 % ophthalmic solution Place 1 drop into both eyes daily.    [provider]  traMADol  (ULTRAM ) 50 MG tablet Take 1 tablet (50 mg total) by mouth every 6 (six) hours as needed for moderate pain (pain score 4-6). 09/27/23   Eletha Boas, MD    Allergies: Alprazolam , Bupropion  hcl er (xl), Cyclobenzaprine, Escitalopram, Mirtazapine, Prednisolone, and Prednisone     Review of Systems  Musculoskeletal:  Positive for neck pain.  All other systems reviewed and are negative.   Updated Vital Signs BP 108/74 (BP Location: Right Arm)   Pulse 78   Temp 98.5 F (36.9 C) (Oral)   Resp (!) 21   Ht 5' 8 (1.727 m)   Wt 61.2 kg   SpO2 100%   BMI 20.53 kg/m   Physical Exam Constitutional:      General: He is not in acute distress.    Appearance: He is well-developed.  HENT:     Head: Atraumatic.     Mouth/Throat:     Comments: Throat exam unremarkable Eyes:     Conjunctiva/sclera: Conjunctivae normal.  Neck:     Comments: No tracheal deviation. Cardiovascular:     Rate and Rhythm: Normal rate and regular rhythm.     Pulses: Normal pulses.     Heart sounds: Normal heart sounds.  Pulmonary:     Effort: Pulmonary effort is normal.     Breath sounds: Normal breath sounds.  Musculoskeletal:     Cervical back: Normal range of motion and neck supple.  Skin:    Findings: No rash.  Neurological:     Mental Status: He is alert.     (all labs ordered are listed, but only abnormal results are displayed) Labs Reviewed  BASIC METABOLIC PANEL WITH GFR - Abnormal; Notable for the following components:      Result Value   GFR, Estimated 58 (*)    All other components within normal limits  CBC WITH  DIFFERENTIAL/PLATELET - Abnormal; Notable for the following components:   WBC 3.2 (*)    Platelets 149 (*)    Neutro Abs 1.4 (*)    All other components within normal limits    EKG: None ED ECG REPORT   Date: 05/22/2024  Rate: 78  Rhythm: normal sinus rhythm and sinus arrhythmia  QRS Axis: normal  Intervals: normal  ST/T Wave abnormalities: nonspecific T wave changes  Conduction Disutrbances:none  Narrative Interpretation:   Old EKG Reviewed: unchanged  I have personally reviewed the EKG tracing and agree with the computerized printout as noted.  Radiology: No results found.   Procedures   Medications Ordered in the ED  lidocaine  (XYLOCAINE ) 2 % viscous mouth solution 15 mL (15 mLs Mouth/Throat Given 05/22/24 1329)                                    Medical Decision Making Amount and/or Complexity of Data Reviewed Labs: ordered.  Risk Prescription drug management.   BP 108/74 (BP Location: Right Arm)   Pulse 78   Temp 98.5 F (36.9 C) (Oral)   Resp (!) 21   Ht 5' 8 (1.727 m)   Wt 61.2 kg   SpO2 100%   BMI 20.53 kg/m   3:61 PM  83 year old male with history of prior stroke, anxiety, depression, hypercholesterolemia, esophageal dysmotility, thyroidectomy, cervical fusion presenting with complaint of throat pain.  Patient report he has had trouble swallowing and throat pain ongoing for more than a year.  He has to spit up his mucus on a regular basis because he feels like he cannot swallow it adequately.  He complaining of throbbing pain to the left side of his head as well.  He states he is frustrated with his symptoms as he has been seen and evaluated multiple times by different provider without any definitive diagnosis.  He feel he is unable to fully express his concern without the presence of his wife who usually talks for him and knows his situation well.  He denies any new symptoms, just a continuation of his difficulty swallowing.  States he has been  losing weight because he is not eating or drinking much.  He has trouble swallowing both liquid and solid.  On exam patient able to speak in complete sentences.  He is spitting his saliva into a bag.  He does not have any neck tenderness to palpation.  His vital signs normal.  I spoke with patient's wife over the phone.  She knows his history well.  She states patient has been complaining of pain in his neck and sore throat ongoing for more than a year since he had his goiter removed.  He is afraid to eat or drink anything due to the pain.  He is losing weight.  He was seen by ENT specialist and they want him to be seen by another ENT specialist next month.  She just felt frustrated that his symptoms still persist and wants some kind of pain relief for him.   -Labs ordered, independently viewed and interpreted by me.  Labs remarkable for reassuring labs without any concerning electrolyte imbalance -The patient was maintained on a cardiac monitor.  I personally viewed and interpreted the cardiac monitored which showed an underlying rhythm of: Normal sinus rhythm -Imaging including neck soft tissue CT considered but pt has had multiple CTs for same from prior visits -This patient presents to the ED for concern of neck pain, this involves an extensive number of treatment options, and is a complaint that carries with it a high risk of complications and morbidity.  The differential diagnosis includes odynophagia, dyspepsia, strep, GERD, stroke, malignancy, nerve damage -Co morbidities that complicate the patient evaluation includes stroke, anxiety, depression -Treatment includes viscous lido -Reevaluation of the patient after these medicines showed that the patient improved -PCP office notes or outside notes reviewed -Discussion with attending Dr. Elnor -Escalation to admission/observation considered: patients feels much better, is comfortable with discharge, and will follow up with ENT -Prescription  medication considered, patient comfortable with viscous lido -Social Determinant of Health considered which includes hx of tobacco use      Final diagnoses:  Odynophagia    ED Discharge Orders          Ordered    lidocaine  (XYLOCAINE ) 2 % solution  As needed        05/22/24 1440               Dyna Figuereo, PA-C 05/22/24 1441    Elnor Jayson LABOR, DO 05/29/24 1550

## 2024-05-25 ENCOUNTER — Other Ambulatory Visit (HOSPITAL_COMMUNITY): Payer: Self-pay | Admitting: *Deleted

## 2024-05-25 DIAGNOSIS — R1310 Dysphagia, unspecified: Secondary | ICD-10-CM

## 2024-05-26 DIAGNOSIS — M79671 Pain in right foot: Secondary | ICD-10-CM | POA: Diagnosis not present

## 2024-05-26 DIAGNOSIS — M2012 Hallux valgus (acquired), left foot: Secondary | ICD-10-CM | POA: Diagnosis not present

## 2024-05-26 DIAGNOSIS — B351 Tinea unguium: Secondary | ICD-10-CM | POA: Diagnosis not present

## 2024-05-26 DIAGNOSIS — L600 Ingrowing nail: Secondary | ICD-10-CM | POA: Diagnosis not present

## 2024-05-26 DIAGNOSIS — I739 Peripheral vascular disease, unspecified: Secondary | ICD-10-CM | POA: Diagnosis not present

## 2024-05-26 DIAGNOSIS — L84 Corns and callosities: Secondary | ICD-10-CM | POA: Diagnosis not present

## 2024-05-26 DIAGNOSIS — M79672 Pain in left foot: Secondary | ICD-10-CM | POA: Diagnosis not present

## 2024-05-27 DIAGNOSIS — G500 Trigeminal neuralgia: Secondary | ICD-10-CM | POA: Diagnosis not present

## 2024-05-27 NOTE — Progress Notes (Signed)
 [facility]  [facility] New Patient Consultation Note  Referring Provider: Delayne Mirna Massa, * Primary Care Provider: Valery Ripple, MD  Randy Merritt is a 83 y.o. old male being seen today by Toribio PARAS. Bintrim MD, at the consultation request of Delayne Mirna Massa, * for new patient office visit.  Medical records reviewed today for this visit: None  Chief Complaint:  Chief Complaint  Patient presents with  . New Patient  . Neck Pain    Left side (swallowing)   History of Present Illness:  Pain Description: The pain started 3-12 months ago after removing scar tissue on left side of throat. Since the pain first started, it is worse. The most significant location of pain is the left neck. Other areas of pain include the left head and ear.  The 1-2 words that best describe the pain: aching and constant The pain improves with nothing. The pain is made worse with eating and swallowing food or drink. The average daily pain score is 7/10. The pain can be as high as 10/10 at its worst. The pain interferes with: eating and swallow anything including food, liquids and medication. He has to shake his head to the right in order to dislodge medication.    Therapies: Previously attempted interventions for this pain concern: neck surgery   Has physical therapy been initiated or completed for this pain concern? No Has chiropractic been completed for this pain concern? No   Medications: Currently utilized over the counter medications/therapy for pain: Acetaminophen /Tylenol    Currently utilized prescription medications/therapies for pain: Muscle relaxers   Currently utilized controlled medications: None   Is the patient on a blood thinner (including aspirin , Goody/BC/Bayer powder)? Yes             - Name of anticoagulant/blood thinner: Aspirin  81mg  ____________________________________________________________________ Past Medical  History: Medical History[1]   Past Surgical History: Surgical History[2]  Family History: Family History[3]  Social History: Social History   Tobacco Use  . Smoking status: Never  . Smokeless tobacco: Never  Substance Use Topics  . Alcohol use: Never    Allergies: Alprazolam , Bupropion  hcl, Cyclobenzaprine, Escitalopram, Lorazepam, Mirtazapine, Prednisolone, and Prednisone   Current Medications: Current Medications[4]  Review of Systems:  Pertinent positive/negative findings related to today's visit are noted in the HPI and Assessment/Plan.  ____________________________________________________________________ Vitals: Vitals:   05/27/24 1152  BP: 114/63  BP Location: Left arm  Patient Position: Sitting  Pulse: 87  Resp: 18  Temp: 98.2 F (36.8 C)  TempSrc: Temporal  Weight: 63 kg (139 lb)   Physical Exam: Constitutional: Well developed, Well nourished, No acute distress and Interactive. General: Patient is alert and orientated. Pain with swallowing, some discomfort with speech Psychological: The patient's mood is normal, and appropriate for the circumstances.  Pain (Neuro/Musculoskeletal) Exam:  Gait:   Normal. Uses assist device - none   Motor: Appropriate strength without any gross motor deficit appreciated. ____________________________________________________________________ Diagnosis: 1. Orofacial neuropathic pain  lidocaine  (XYLOCAINE ) 5 % ointment     Assessment: Randy Merritt is a 83 y.o. old male referred from Delayne Mirna Massa, * presenting for new patient consultation.  He presents with new onset left neck/throat pain which he reports developed following total thyroidectomy performed in November 2024.  He endorses odynophagia and dysphagia and reports worsening pain with palpation over the left side of his anterior neck.  He has had some difficulty with speech, largely due to mild pain with phonation and is seeing speech  therapy for these  concerns.  He has been using Tylenol  and does not note significant benefit.  He does note some trouble with swallowing tablets for this.  We discussed available options for treatment of his pain, his pain is largely neuropathic in nature and most of my discussion focused around trialing a neuropathic agent for this.  He does endorse some concerns with use of gabapentin given reports of increased risk of dementia with use of this medication and family members who have had adverse reactions with use of this medication.  He is not a candidate for anti-inflammatory medications.  Muscle relaxers are unlikely to assist with his current pain concerns.  He has trialed and failed acetaminophen .  Opioids carry similar risk as gabapentin use, potentially more significantly so.  We did discuss a trial of duloxetine which does have neuropathic pain coverage and after discussion of risk and benefits of this medication he would like to move forward with a trial of this.  I also discussed use of topical agents, he is interested in trialing a regimen of topical lidocaine  over painful areas of the skin of the anterior neck.  He does have an upcoming appointment with ENT for an additional opinion regarding his concerns, we will follow-up after completion of this visit.  Plan: Interventional treatments: -None  Medication recommendations: - Will move forward with a trial of Cymbalta 20 mg daily increased to 40 mg daily thereafter.  Could consider increase to 60 mg pending tolerance, would monitor renal function in the setting if increased at this dose - Can revisit gabapentin/pregabalin as indicated in the future - Consider TCA as an alternative agent for pain concerns - Consider topiramate as alternative agent for pain concerns  Physical therapy: - Currently working with speech therapy  Imaging/diagnostic studies recommended: -None, defer further workup to ENT  Consults/referrals placed: -None, scheduled to see  ENT  Follow up recommendation: -In 6-8 weeks  Treatment plan fully discussed and agreed upon with the patient. All questions were answered to his satisfaction today.  Medical decision making for this patient was moderately complex given the patient's preexisting comorbidities, chronicity of the patient's current pathology, review of relevant records from other healthcare providers, independent interpretation of imaging and/or lab results if appropriate, discussion regarding risks and benefits of proceeding forward with or holding off on scheduling a minor procedure if appropriate and designated above, the severity/progression of the pathology discussed, specific medication management if reviewed, discussion regarding financial implications of treatment, and/or the risk of complication and/or morbidity that may exist with or without treatment.  Electronically Signed by: Toribio Badder, MD, Attending Physician       [1] Past Medical History: Diagnosis Date  . Acid reflux   . Arthritis   . Diverticulosis   . High cholesterol   . History of adenomatous polyp of colon   . Internal hemorrhoids   [2] Past Surgical History: Procedure Laterality Date  . CERVICAL FUSION     Procedure: CERVICAL FUSION  . COLONOSCOPY     Procedure: COLONOSCOPY; 2004, 2009, 2015, 2020  . ESOPHAGEAL DILATION     Procedure: ESOPHAGEAL DILATION; 2004, 2022  . ESOPHAGOGASTRODUODENOSCOPY     Procedure: ESOPHAGOGASTRODUODENOSCOPY; 2004, 2011, 2014, 2019, 2022  . LAPAROSCOPIC CHOLECYSTECTOMY     Procedure: LAPAROSCOPIC CHOLECYSTECTOMY  . TOTAL THYROIDECTOMY  09/2023  [3] Family History Problem Relation Name Age of Onset  . Diabetes Sister    [4] Current Outpatient Medications  Medication Sig Dispense Refill  . acetaminophen  (TYLENOL ) 500  mg tablet Take 1,000 mg by mouth.    . ALPRAZolam  (XANAX ) 0.25 mg tablet Take 0.25 mg by mouth.    . aspirin  81 mg EC tablet Take 1 tablet by mouth daily.    . calcium   carbonate (Tums) 500 mg (200 mg calcium ) chewable tablet Chew.    . droNABinol  (MARINOL ) 5 mg capsule TAKE 1 CAPSULE BY MOUTH TWICE DAILY BEFORE LUNCH AND SUPPER    . famotidine  (PEPCID ) 20 mg tablet 1 tablet by mouth daily for 90 days    . finasteride  (PROSCAR ) 5 mg tablet Take 1 tablet by mouth daily.    . fluticasone  propionate (FLONASE ) 50 mcg/spray nasal spray Administer 2 sprays into each nostril daily.    . levothyroxine  (SYNTHROID ) 100 mcg tablet Take 100 mcg by mouth.    . metoprolol  succinate (TOPROL  XL) 25 mg 24 hr tablet Take 25 mg by mouth daily.    . pantoprazole  (PROTONIX ) 40 mg EC tablet Take 40 mg by mouth Once Daily for 30 days. 30 tablet 3  . QUEtiapine  (SEROquel ) 25 mg tablet Take 25 mg by mouth nightly.    . rosuvastatin  (CRESTOR ) 5 mg tablet      No current facility-administered medications for this visit.

## 2024-05-27 NOTE — Telephone Encounter (Signed)
 [facility]  [facility]  New Patient Screening Note  Randy Merritt is a 83 y.o. old male presenting for new patient visit.  Pain Description: The pain started 3-12 months ago after removing scar tissue on left side of throat. Since the pain first started, it is worse. The most significant location of pain is the left neck. Other areas of pain include the left head and ear.  The 1-2 words that best describe the pain: aching and constant The pain improves with nothing. The pain is made worse with eating and swallowing food or drink. The average daily pain score is 7/10. The pain can be as high as 10/10 at its worst. The pain interferes with: eating and swallow anything including food, liquids and medication. He has to shake his head to the right in order to dislodge medication.   Therapies: Previously attempted interventions for this pain concern: neck surgery  Has physical therapy been initiated or completed for this pain concern? No Has chiropractic been completed for this pain concern? No  Medications: Currently utilized over the counter medications/therapy for pain: Acetaminophen /Tylenol   Currently utilized prescription medications/therapies for pain: Muscle relaxers  Currently utilized controlled medications: None  Is the patient on a blood thinner (including aspirin , Goody/BC/Bayer powder)? Yes  - Name of anticoagulant/blood thinner: Aspirin  81mg 

## 2024-05-28 DIAGNOSIS — Z8673 Personal history of transient ischemic attack (TIA), and cerebral infarction without residual deficits: Secondary | ICD-10-CM | POA: Diagnosis not present

## 2024-05-28 DIAGNOSIS — N1831 Chronic kidney disease, stage 3a: Secondary | ICD-10-CM | POA: Diagnosis not present

## 2024-05-28 DIAGNOSIS — F411 Generalized anxiety disorder: Secondary | ICD-10-CM | POA: Diagnosis not present

## 2024-05-28 DIAGNOSIS — K219 Gastro-esophageal reflux disease without esophagitis: Secondary | ICD-10-CM | POA: Diagnosis not present

## 2024-05-28 DIAGNOSIS — R1310 Dysphagia, unspecified: Secondary | ICD-10-CM | POA: Diagnosis not present

## 2024-05-28 DIAGNOSIS — E890 Postprocedural hypothyroidism: Secondary | ICD-10-CM | POA: Diagnosis not present

## 2024-05-28 DIAGNOSIS — R634 Abnormal weight loss: Secondary | ICD-10-CM | POA: Diagnosis not present

## 2024-06-03 DIAGNOSIS — R0602 Shortness of breath: Secondary | ICD-10-CM | POA: Diagnosis not present

## 2024-06-03 DIAGNOSIS — R0981 Nasal congestion: Secondary | ICD-10-CM | POA: Diagnosis not present

## 2024-06-10 ENCOUNTER — Ambulatory Visit (HOSPITAL_COMMUNITY)
Admission: RE | Admit: 2024-06-10 | Discharge: 2024-06-10 | Disposition: A | Discharge status: Home / Self Care | Source: Ambulatory Visit | Attending: *Deleted | Admitting: *Deleted

## 2024-06-10 ENCOUNTER — Ambulatory Visit (HOSPITAL_COMMUNITY)
Admission: RE | Admit: 2024-06-10 | Discharge: 2024-06-10 | Disposition: A | Discharge status: Home / Self Care | Source: Ambulatory Visit | Attending: Otolaryngology | Admitting: Otolaryngology

## 2024-06-10 DIAGNOSIS — R4701 Aphasia: Secondary | ICD-10-CM | POA: Diagnosis not present

## 2024-06-10 DIAGNOSIS — R1312 Dysphagia, oropharyngeal phase: Secondary | ICD-10-CM | POA: Diagnosis not present

## 2024-06-10 DIAGNOSIS — N400 Enlarged prostate without lower urinary tract symptoms: Secondary | ICD-10-CM | POA: Insufficient documentation

## 2024-06-10 DIAGNOSIS — Z8673 Personal history of transient ischemic attack (TIA), and cerebral infarction without residual deficits: Secondary | ICD-10-CM | POA: Insufficient documentation

## 2024-06-10 DIAGNOSIS — E049 Nontoxic goiter, unspecified: Secondary | ICD-10-CM | POA: Insufficient documentation

## 2024-06-10 DIAGNOSIS — M2578 Osteophyte, vertebrae: Secondary | ICD-10-CM | POA: Diagnosis not present

## 2024-06-10 DIAGNOSIS — J029 Acute pharyngitis, unspecified: Secondary | ICD-10-CM | POA: Insufficient documentation

## 2024-06-10 DIAGNOSIS — M5412 Radiculopathy, cervical region: Secondary | ICD-10-CM | POA: Insufficient documentation

## 2024-06-10 DIAGNOSIS — R413 Other amnesia: Secondary | ICD-10-CM | POA: Diagnosis not present

## 2024-06-10 DIAGNOSIS — F329 Major depressive disorder, single episode, unspecified: Secondary | ICD-10-CM | POA: Insufficient documentation

## 2024-06-10 DIAGNOSIS — K219 Gastro-esophageal reflux disease without esophagitis: Secondary | ICD-10-CM | POA: Insufficient documentation

## 2024-06-10 DIAGNOSIS — R1310 Dysphagia, unspecified: Secondary | ICD-10-CM | POA: Diagnosis not present

## 2024-06-10 NOTE — Progress Notes (Signed)
 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  His surgical procedures have included cervical spine surgeries (one in 2014 with Dr Leeann, endoscopy with esophageal dilatation 2024 without improvement, thyroidectomy 09/2023, ? glossopharyngeal nerve palsy?  Pt has h/o dysphagia including pharyngeal and esophgeal dating back years in the chart. He reports his swallow ability is worse since his thyroidectomy and scar tissue removal.  Reports largest complaint is pain with swallowing that is on the left side of his neck.  He states it feels like something is being pulled on that side.  His wife reports he has been spitting up secretions for years . Per chart review, pt was seen at Urgent care and by ENT in 2020 with odynophagia and spitting because he could not swallow his saliva.  He also had a trapezius spasm in 2019 and was seen prior to 2019 for esophageal dysphagia/GERD.  He has lost approximately 30 pounds since his thyroidectomy per his wife.  Recent brain MRI of brain showed  Generalized age-related cerebral atrophy. Patchy and  confluent T2/FLAIR hyperintensity involving the periventricular deep  white matter both cerebral hemispheres as well as the pons,  consistent with chronic small vessel ischemic disease, moderately  advanced in nature. Few small remote lacunar infarcts noted about  the deep gray nuclei.   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   Other imaging testing  concerning odynophagia docomented as Bulky anterior osteophyte at C2-3 exerts mass effect on the left  posterior aspect of the hypopharynx.  2. 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  GI prior note endoscopy showed narrowed UES with extrinsic compression from thyroid  +/- cervical osteophytes.   Clinical Impression: Clinical Impression: Pt presents with mild oral and moderate pharyngeal and cervical esophageal dysphagia. Pt noted to have secretions retained in pharynx - which likely attribute to his symptoms.  Pt continues with piecemealing which is effective for self-driven compensations. Pharyngeal swallow marked by decreased laryngeal elevation/closure resulting laryngeal penetration of thin liquids - most notably with sequential swallows.  Pharyngeal retention noted across all consistencies due to decreased pharyngeal motility, tongue base retraction and impaired hyolaryngeal elevation.  Pharyngeal retention is more pronounced than on most recent MBS early 2024.  He continues to benefit from reflexive dry swallows but does not fully clear his pharynx.  Head postures unable to be attempted due to his limited cervical ROM.  At baseline, pt noted with rhinorrhea, throat clearing and expectoration of secretions, which is   concerning for multi-factorial dysphagia. He was noted to throat clear, nearly gag and belch during MBS - barium was not visualized in larynx or trachea at that time.  He is protective of his airway but retention does remain in pharynx.  Pt denied discomfort with swallowing barium today.  SLP questions if pt may have some component of obstructive dysphagia from cervical hardware but also suspect potential component of PES dysfunction given trace backflow of liquid observed with swallow (reference #[id number] images [id number].   Recommend pt consume plenty of liquids continue  dys3/thin diet - starting intake with liquids, following bites of foods with sips of liquids  and conducting dry swallows. Advised he speak to pharmacist to see if medications can be crushed or changed to suspension.  Of note, pt also reports he "loses a taste" for things while he is eating- even if he does not have odynophagia.  Recommend continuing pursuing source of his discomfort - but again question if he may benefit from ENT to determine if intervention to PES may provide any mitigation for this unfortunate pt.  Thanks for this consult.  Factors that may increase risk of adverse event in presence of aspiration Randy Merritt 2021): Factors that may increase risk of adverse event in presence of aspiration Randy Merritt 2021): Reduced cognitive function   Recommendations/Plan: Swallowing Evaluation Recommendations Swallowing Evaluation Recommendations Recommendations: PO diet PO Diet Recommendation: Regular; Thin liquids (Level 0) Liquid Administration via: Cup; Straw Medication Administration: Other (Comment) (? crushed, capsule opened, suspension, whole if small with Ensure) Supervision: Patient able to self-feed Swallowing strategies  : Slow rate; Small bites/sips; Multiple dry swallows after each bite/sip Postural changes: Position pt fully upright for meals; Stay upright 30-60 min after meals Oral care recommendations: Oral care BID (2x/day)    Treatment Plan No data recorded   Recommendations Recommendations for follow up therapy are one component of a multi-disciplinary discharge planning process, led by the attending physician.  Recommendations may be updated based on patient status, additional functional criteria and insurance authorization.  Assessment: Orofacial Exam: Orofacial Exam Oral Cavity: Oral Hygiene: WFL Oral Cavity - Dentition: Adequate natural dentition; Missing dentition; Other (Comment) Orofacial Anatomy: WFL Oral Motor/Sensory Function: WFL    Anatomy:  Anatomy: Suspected cervical osteophytes; Presence of cervical hardware   Boluses  Administered: Boluses Administered Boluses Administered: Thin liquids (Level 0); Mildly thick liquids (Level 2, nectar thick); Puree; Solid     Oral Impairment Domain: Oral Impairment Domain Lip Closure: No labial escape Tongue control during bolus hold: Cohesive bolus between tongue to palatal seal Bolus preparation/mastication: Slow prolonged chewing/mashing with complete recollection Bolus transport/lingual motion: Brisk tongue motion Oral residue: Trace residue lining oral structures Location of oral residue : Tongue Initiation of pharyngeal swallow : Valleculae; Pyriform sinuses (tsp of thin - triggered at latest 5 seconds aggregating to pyriform sinus)     Pharyngeal Impairment Domain: Pharyngeal Impairment Domain Soft palate elevation: No bolus between soft palate (SP)/pharyngeal wall (PW) Laryngeal elevation: Partial superior movement of thyroid  cartilage/partial approximation of arytenoids to epiglottic petiole Anterior hyoid excursion: Partial anterior movement Epiglottic movement: Partial inversion Laryngeal vestibule closure: Incomplete, narrow column air/contrast in laryngeal vestibule Pharyngeal stripping wave : Present - diminished Pharyngeal contraction (A/P view only): N/A Pharyngoesophageal segment opening: -- (please see reference #19 images [id number], pt appears with  eructation with trace backflow of liquid through UES, ? impact of cervical spine hardware on UES) Tongue base retraction: Trace column of contrast or air between tongue base and PPW Pharyngeal residue: Collection of residue within or on pharyngeal structures Location of pharyngeal residue: Valleculae; Pyriform sinuses; Aryepiglottic folds; Tongue base; Diffuse (>3 areas)     Esophageal Impairment Domain: No data recorded  Pill: Pill Consistency administered: Mildly thick liquids (Level 2, nectar thick)    Penetration/Aspiration Scale Score: Penetration/Aspiration Scale Score 1.  Material  does not enter airway: Thin liquids (Level 0); Mildly thick liquids (Level 2, nectar thick); Puree; Solid; Pill    Compensatory Strategies: Compensatory Strategies Compensatory strategies: No (pt not able to turn head  nor tuck chin due to limited ROM from cervical spine surgeries)       General Information: Caregiver present: Yes (spouse, Julienne)   Diet Prior to this Study: Dysphagia 3 (mechanical soft); Thin liquids (Level 0)    No data recorded   Respiratory Status: WFL    Supplemental O2: None (Room air)    History of Recent Intubation: No   Behavior/Cognition: Alert; Cooperative; Pleasant mood  Self-Feeding Abilities: Able to self-feed  Baseline vocal quality/speech: Normal  Volitional Cough: Able to elicit  Volitional Swallow: Able to elicit  Exam Limitations: Excessive movement   Goal Planning: No data recorded No data recorded No data recorded No data recorded No data recorded  Pain: Pain Assessment Pain Assessment: No/denies pain    End of Session: Start Time:SLP Start Time (ACUTE ONLY): 1239  Stop Time: SLP Stop Time (ACUTE ONLY): 1315  Time Calculation:SLP Time Calculation (min) (ACUTE ONLY): 36 min  Charges: SLP Evaluations $ SLP Speech Visit: 1 Visit  SLP Evaluations $Outpatient MBS Swallow: 1 Procedure $Swallowing Treatment: 1 Procedure   SLP visit diagnosis: SLP Visit Diagnosis: Dysphagia, oropharyngeal phase (R13.12); Dysphagia, pharyngoesophageal phase (R13.14)    Past Medical History:  Past Medical History:  Diagnosis Date   Acid reflux    Anxiety    Arthritis    Depression     a little   Goiter    High cholesterol    History of blood transfusion    History of kidney stones    Memory loss    Mild per wife   Perforated bowel (HCC)    Seasonal allergies    Stroke [facility])    showed on Scan per wife no symptoms   Past Surgical History:  Past Surgical History:  Procedure Laterality Date   ANTERIOR CERVICAL  DECOMP/DISCECTOMY FUSION N/A 04/08/2013   Procedure: ANTERIOR CERVICAL DECOMPRESSION/DISCECTOMY FUSION 1 LEVEL;  Surgeon: Catalina CHRISTELLA Stains, MD;  Location: [facility];  Service: Neurosurgery;  Laterality: N/A;  Cervical five-six Anterior cervical decompression/diskectomy fusion   APPENDECTOMY N/A 10/06/2019   Procedure: APPENDECTOMY;  Surgeon: Signe Mitzie LABOR, MD;  Location: [facility] OR;  Service: General;  Laterality: N/A;   BIOPSY  01/30/2019   Procedure: BIOPSY;  Surgeon: Rollin Dover, MD;  Location: [facility];  Service: Endoscopy;;   CERVICAL DISCECTOMY     x2   CHOLECYSTECTOMY     COLON RESECTION SIGMOID N/A 10/06/2019   Procedure: COLON RESECTION SIGMOID;  Surgeon: Signe Mitzie LABOR, MD;  Location: [facility] OR;  Service: General;  Laterality: N/A;   ESOPHAGEAL DILATION  01/30/2019   Procedure: ESOPHAGEAL DILATION;  Surgeon: Rollin Dover, MD;  Location: [facility];  Service: Endoscopy;;  [place]   ESOPHAGOGASTRODUODENOSCOPY (EGD) WITH PROPOFOL  N/A 01/30/2019   Procedure: ESOPHAGOGASTRODUODENOSCOPY (EGD) WITH PROPOFOL ;  Surgeon: Rollin Dover, MD;  Location: [facility];  Service: Endoscopy;  Laterality: N/A;   gallstone removal     LAPAROTOMY N/A 10/06/2019   Procedure: EXPLORATORY LAPAROTOMY;  Surgeon: Signe Mitzie LABOR, MD;  Location: [facility] OR;  Service: General;  Laterality: N/A;   THYROIDECTOMY N/A 09/27/2023   Procedure: TOTAL THYROIDECTOMY;  Surgeon: Eletha Boas, MD;  Location: [facility] ORS;  Service: General;  Laterality: N/A;    Nicolas Emmie Caldron 06/10/2024, 4:36 PM

## 2024-06-18 DIAGNOSIS — F411 Generalized anxiety disorder: Secondary | ICD-10-CM | POA: Diagnosis not present

## 2024-06-18 DIAGNOSIS — D638 Anemia in other chronic diseases classified elsewhere: Secondary | ICD-10-CM | POA: Diagnosis not present

## 2024-06-18 DIAGNOSIS — E890 Postprocedural hypothyroidism: Secondary | ICD-10-CM | POA: Diagnosis not present

## 2024-06-18 DIAGNOSIS — Z8673 Personal history of transient ischemic attack (TIA), and cerebral infarction without residual deficits: Secondary | ICD-10-CM | POA: Diagnosis not present

## 2024-06-18 DIAGNOSIS — I10 Essential (primary) hypertension: Secondary | ICD-10-CM | POA: Diagnosis not present

## 2024-06-18 DIAGNOSIS — R1312 Dysphagia, oropharyngeal phase: Secondary | ICD-10-CM | POA: Diagnosis not present

## 2024-06-22 DIAGNOSIS — M2420 Disorder of ligament, unspecified site: Secondary | ICD-10-CM | POA: Diagnosis not present

## 2024-06-22 DIAGNOSIS — M542 Cervicalgia: Secondary | ICD-10-CM | POA: Diagnosis not present

## 2024-06-22 DIAGNOSIS — R1314 Dysphagia, pharyngoesophageal phase: Secondary | ICD-10-CM | POA: Diagnosis not present

## 2024-07-02 DIAGNOSIS — E890 Postprocedural hypothyroidism: Secondary | ICD-10-CM | POA: Diagnosis not present

## 2024-07-02 DIAGNOSIS — N1831 Chronic kidney disease, stage 3a: Secondary | ICD-10-CM | POA: Diagnosis not present

## 2024-07-10 DIAGNOSIS — F5101 Primary insomnia: Secondary | ICD-10-CM | POA: Diagnosis not present

## 2024-07-10 DIAGNOSIS — F331 Major depressive disorder, recurrent, moderate: Secondary | ICD-10-CM | POA: Diagnosis not present

## 2024-07-10 DIAGNOSIS — F411 Generalized anxiety disorder: Secondary | ICD-10-CM | POA: Diagnosis not present

## 2024-07-15 DIAGNOSIS — M542 Cervicalgia: Secondary | ICD-10-CM | POA: Diagnosis not present

## 2024-07-15 DIAGNOSIS — R1314 Dysphagia, pharyngoesophageal phase: Secondary | ICD-10-CM | POA: Diagnosis not present

## 2024-07-16 NOTE — Telephone Encounter (Signed)
 Spoke to pt's wife, pt's neck issues was caused by the thyroidectomy he had in November of 2024 and did not have swallowing issues before this procedure. She states the brace in his neck is not the issue.

## 2024-08-03 DIAGNOSIS — R630 Anorexia: Secondary | ICD-10-CM | POA: Diagnosis not present

## 2024-08-03 DIAGNOSIS — R634 Abnormal weight loss: Secondary | ICD-10-CM | POA: Diagnosis not present

## 2024-08-03 DIAGNOSIS — R1312 Dysphagia, oropharyngeal phase: Secondary | ICD-10-CM | POA: Diagnosis not present

## 2024-08-03 DIAGNOSIS — F419 Anxiety disorder, unspecified: Secondary | ICD-10-CM | POA: Diagnosis not present

## 2024-08-07 ENCOUNTER — Encounter: Payer: Self-pay | Admitting: Physician Assistant

## 2024-09-04 DIAGNOSIS — F411 Generalized anxiety disorder: Secondary | ICD-10-CM | POA: Diagnosis not present

## 2024-09-04 DIAGNOSIS — F331 Major depressive disorder, recurrent, moderate: Secondary | ICD-10-CM | POA: Diagnosis not present

## 2024-09-04 DIAGNOSIS — F5101 Primary insomnia: Secondary | ICD-10-CM | POA: Diagnosis not present

## 2024-09-15 DIAGNOSIS — R5381 Other malaise: Secondary | ICD-10-CM | POA: Diagnosis not present

## 2024-09-15 DIAGNOSIS — M542 Cervicalgia: Secondary | ICD-10-CM | POA: Diagnosis not present

## 2024-09-15 DIAGNOSIS — R1312 Dysphagia, oropharyngeal phase: Secondary | ICD-10-CM | POA: Diagnosis not present

## 2024-09-15 DIAGNOSIS — R1314 Dysphagia, pharyngoesophageal phase: Secondary | ICD-10-CM | POA: Diagnosis not present

## 2024-09-15 DIAGNOSIS — Z9889 Other specified postprocedural states: Secondary | ICD-10-CM | POA: Diagnosis not present

## 2024-09-15 DIAGNOSIS — M2578 Osteophyte, vertebrae: Secondary | ICD-10-CM | POA: Diagnosis not present

## 2024-09-15 DIAGNOSIS — R1313 Dysphagia, pharyngeal phase: Secondary | ICD-10-CM | POA: Diagnosis not present

## 2024-09-15 DIAGNOSIS — M2420 Disorder of ligament, unspecified site: Secondary | ICD-10-CM | POA: Diagnosis not present

## 2024-09-22 DIAGNOSIS — M2420 Disorder of ligament, unspecified site: Secondary | ICD-10-CM | POA: Diagnosis not present

## 2024-09-22 DIAGNOSIS — R5381 Other malaise: Secondary | ICD-10-CM | POA: Diagnosis not present

## 2024-09-22 DIAGNOSIS — R1312 Dysphagia, oropharyngeal phase: Secondary | ICD-10-CM | POA: Diagnosis not present

## 2024-09-22 DIAGNOSIS — M542 Cervicalgia: Secondary | ICD-10-CM | POA: Diagnosis not present

## 2024-09-22 DIAGNOSIS — Z9889 Other specified postprocedural states: Secondary | ICD-10-CM | POA: Diagnosis not present

## 2024-09-22 DIAGNOSIS — R1313 Dysphagia, pharyngeal phase: Secondary | ICD-10-CM | POA: Diagnosis not present

## 2024-09-22 DIAGNOSIS — M2578 Osteophyte, vertebrae: Secondary | ICD-10-CM | POA: Diagnosis not present

## 2024-09-22 DIAGNOSIS — R1314 Dysphagia, pharyngoesophageal phase: Secondary | ICD-10-CM | POA: Diagnosis not present

## 2024-09-28 DIAGNOSIS — R1313 Dysphagia, pharyngeal phase: Secondary | ICD-10-CM | POA: Diagnosis not present

## 2024-09-28 DIAGNOSIS — R1312 Dysphagia, oropharyngeal phase: Secondary | ICD-10-CM | POA: Diagnosis not present

## 2024-09-28 DIAGNOSIS — Z9889 Other specified postprocedural states: Secondary | ICD-10-CM | POA: Diagnosis not present

## 2024-09-28 DIAGNOSIS — M2578 Osteophyte, vertebrae: Secondary | ICD-10-CM | POA: Diagnosis not present

## 2024-09-28 DIAGNOSIS — R1314 Dysphagia, pharyngoesophageal phase: Secondary | ICD-10-CM | POA: Diagnosis not present

## 2024-09-28 DIAGNOSIS — M542 Cervicalgia: Secondary | ICD-10-CM | POA: Diagnosis not present

## 2024-09-28 DIAGNOSIS — M2420 Disorder of ligament, unspecified site: Secondary | ICD-10-CM | POA: Diagnosis not present

## 2024-09-28 DIAGNOSIS — R5381 Other malaise: Secondary | ICD-10-CM | POA: Diagnosis not present

## 2024-10-07 DIAGNOSIS — N1831 Chronic kidney disease, stage 3a: Secondary | ICD-10-CM | POA: Diagnosis not present

## 2024-10-07 DIAGNOSIS — R634 Abnormal weight loss: Secondary | ICD-10-CM | POA: Diagnosis not present

## 2024-10-07 DIAGNOSIS — R7303 Prediabetes: Secondary | ICD-10-CM | POA: Diagnosis not present

## 2024-10-07 DIAGNOSIS — I10 Essential (primary) hypertension: Secondary | ICD-10-CM | POA: Diagnosis not present

## 2024-10-07 DIAGNOSIS — E890 Postprocedural hypothyroidism: Secondary | ICD-10-CM | POA: Diagnosis not present

## 2024-10-15 DIAGNOSIS — R634 Abnormal weight loss: Secondary | ICD-10-CM | POA: Diagnosis not present

## 2024-10-19 ENCOUNTER — Encounter: Payer: Self-pay | Admitting: Physician Assistant

## 2024-10-19 ENCOUNTER — Other Ambulatory Visit

## 2024-10-19 ENCOUNTER — Ambulatory Visit

## 2024-10-19 ENCOUNTER — Ambulatory Visit: Payer: Self-pay | Admitting: Physician Assistant

## 2024-10-19 VITALS — BP 104/73 | HR 89 | Resp 20 | Ht 68.0 in | Wt 126.0 lb

## 2024-10-19 DIAGNOSIS — R413 Other amnesia: Secondary | ICD-10-CM

## 2024-10-19 NOTE — Patient Instructions (Addendum)
 It was a pleasure to see you today at our office.   Recommendations:   Check labs today   Follow up in 6 months We will start donepezil  half tablet (5mg ) daily for 2  weeks.  If you are tolerating the medication, then after 2 weeks, we will increase the dose to a full tablet of 10 mg daily.  Recommend visiting the website :  Dementia Success Path to better understand some behaviors related to memory loss.  For psychiatric meds, mood meds: Please have your psychiatrist to manage these medications.  If you have any severe symptoms of a stroke, or other severe issues such as confusion,severe chills or fever, etc call 911 or go to the ER as you may need to be evaluated further    [email or web address]   RECOMMENDATIONS FOR ALL PATIENTS WITH MEMORY PROBLEMS: 1. Continue to exercise (Recommend 30 minutes of walking everyday, or 3 hours every week) 2. Increase social interactions - continue going to [place] and enjoy social gatherings with friends and family 3. Eat healthy, avoid fried foods and eat more fruits and vegetables 4. Maintain adequate blood pressure, blood sugar, and blood cholesterol level. Reducing the risk of stroke and cardiovascular disease also helps promoting better memory. 5. Avoid stressful situations. Live a simple life and avoid aggravations. Organize your time and prepare for the next day in anticipation. 6. Sleep well, avoid any interruptions of sleep and avoid any distractions in the bedroom that may interfere with adequate sleep quality 7. Avoid sugar, avoid sweets as there is a strong link between excessive sugar intake, diabetes, and cognitive impairment We discussed the Mediterranean diet, which has been shown to help patients reduce the risk of progressive memory disorders and reduces cardiovascular risk. This includes eating fish, eat fruits and green leafy vegetables, nuts like almonds and hazelnuts, walnuts, and  also use olive oil. Avoid fast foods and fried foods as much as possible. Avoid sweets and sugar as sugar use has been linked to worsening of memory function.  There is always a concern of gradual progression of memory problems. If this is the case, then we may need to adjust level of care according to patient needs. Support, both to the patient and caregiver, should then be put into place.         DRIVING: Regarding driving, in patients with progressive memory problems, driving will be impaired. We advise to have someone else do the driving if trouble finding directions or if minor accidents are reported. Independent driving assessment is available to determine safety of driving.   If you are interested in the driving assessment, you can contact the following:  [employer] in [place] [phone]  [facility] [phone]  [facility] [phone]  [facility] [phone] or [phone]   FALL PRECAUTIONS: Be cautious when walking. Scan the area for obstacles that may increase the risk of trips and falls. When getting up in the mornings, sit up at the edge of the bed for a few minutes before getting out of bed. Consider elevating the bed at the head end to avoid drop of blood pressure when getting up. Walk always in a well-lit room (use night lights in the walls). Avoid area rugs or power cords from appliances in the middle of the walkways. Use a walker or a cane if necessary and consider physical therapy for balance exercise. Get your eyesight checked regularly.  FINANCIAL OVERSIGHT: Supervision, especially oversight when making financial decisions or transactions is also recommended.  HOME SAFETY: Consider the safety of the kitchen when operating appliances like stoves, microwave oven, and blender. Consider having supervision and share cooking responsibilities until no longer able to participate in those. Accidents with firearms and  other hazards in the house should be identified and addressed as well.   ABILITY TO BE LEFT ALONE: If patient is unable to contact 911 operator, consider using LifeLine, or when the need is there, arrange for someone to stay with patients. Smoking is a fire hazard, consider supervision or cessation. Risk of wandering should be assessed by caregiver and if detected at any point, supervision and safe proof recommendations should be instituted.  MEDICATION SUPERVISION: Inability to self-administer medication needs to be constantly addressed. Implement a mechanism to ensure safe administration of the medications.      Mediterranean Diet A Mediterranean diet refers to food and lifestyle choices that are based on the traditions of countries located on the [employer] [place]. This way of eating has been shown to help prevent certain conditions and improve outcomes for people who have chronic diseases, like kidney disease and heart disease. What are tips for following this plan? Lifestyle  Cook and eat meals together with your family, when possible. Drink enough fluid to keep your urine clear or pale yellow. Be physically active every day. This includes: Aerobic exercise like running or swimming. Leisure activities like gardening, walking, or housework. Get 7-8 hours of sleep each night. If recommended by your health care provider, drink red wine in moderation. This means 1 glass a day for nonpregnant women and 2 glasses a day for men. A glass of wine equals 5 oz (150 mL). Reading food labels  Check the serving size of packaged foods. For foods such as rice and pasta, the serving size refers to the amount of cooked product, not dry. Check the total fat in packaged foods. Avoid foods that have saturated fat or trans fats. Check the ingredients list for added sugars, such as corn syrup. Shopping  At the grocery store, buy most of your food from the areas near the walls of the store. This  includes: Fresh fruits and vegetables (produce). Grains, beans, nuts, and seeds. Some of these may be available in unpackaged forms or large amounts (in bulk). Fresh seafood. Poultry and eggs. Low-fat dairy products. Buy whole ingredients instead of prepackaged foods. Buy fresh fruits and vegetables in-season from local farmers markets. Buy frozen fruits and vegetables in resealable bags. If you do not have access to quality fresh seafood, buy precooked frozen shrimp or canned fish, such as tuna, salmon, or sardines. Buy small amounts of raw or cooked vegetables, salads, or olives from the deli or salad bar at your store. Stock your pantry so you always have certain foods on hand, such as olive oil, canned tuna, canned tomatoes, rice, pasta, and beans. Cooking  Cook foods with extra-virgin olive oil instead of using butter or other vegetable oils. Have meat as a side dish, and have vegetables or grains as your main dish. This means having meat in small portions or adding small amounts of meat to foods like pasta or stew. Use beans or vegetables instead of meat in common dishes like chili or lasagna. Experiment with different cooking methods. Try roasting or broiling vegetables instead of steaming or sauteing them. Add frozen vegetables to soups, stews, pasta, or rice. Add nuts or seeds for added healthy fat at each meal. You can add these to yogurt, salads, or vegetable dishes. Marinate fish or vegetables  using olive oil, lemon juice, garlic, and fresh herbs. Meal planning  Plan to eat 1 vegetarian meal one day each week. Try to work up to 2 vegetarian meals, if possible. Eat seafood 2 or more times a week. Have healthy snacks readily available, such as: Vegetable sticks with hummus. Greek yogurt. Fruit and nut trail mix. Eat balanced meals throughout the week. This includes: Fruit: 2-3 servings a day Vegetables: 4-5 servings a day Low-fat dairy: 2 servings a day Fish, poultry, or  lean meat: 1 serving a day Beans and legumes: 2 or more servings a week Nuts and seeds: 1-2 servings a day Whole grains: 6-8 servings a day Extra-virgin olive oil: 3-4 servings a day Limit red meat and sweets to only a few servings a month What are my food choices? Mediterranean diet Recommended Grains: Whole-grain pasta. Brown rice. Bulgar wheat. Polenta. Couscous. Whole-wheat bread. [place] Made[place]. Vegetables: Artichokes. Beets. Broccoli. Cabbage. Carrots. Eggplant. Green beans. Chard. Kale. Spinach. Onions. Leeks. Peas. Squash. Tomatoes. Peppers. Radishes. Fruits: Apples. Apricots. Avocado. Berries. Bananas. Cherries. Dates. Figs. Grapes. Lemons. Melon. Oranges. Peaches. Plums. Pomegranate. Meats and other protein foods: Beans. Almonds. Sunflower seeds. Pine nuts. Peanuts. Cod. Salmon. Scallops. Shrimp. Tuna. Tilapia. Clams. Oysters. Eggs. Dairy: Low-fat milk. Cheese. Greek yogurt. Beverages: Water . Red wine. Herbal tea. Fats and oils: Extra virgin olive oil. Avocado oil. Grape seed oil. Sweets and desserts: Greek yogurt with honey. Baked apples. Poached pears. Trail mix. Seasoning and other foods: Basil. Cilantro. Coriander. Cumin. Mint. Parsley. Sage. Rosemary. Tarragon. Garlic. Oregano. Thyme. Pepper. Balsalmic vinegar. Tahini. Hummus. Tomato sauce. Olives. Mushrooms. Limit these Grains: Prepackaged pasta or rice dishes. Prepackaged cereal with added sugar. Vegetables: Deep fried potatoes (french fries). Fruits: Fruit canned in syrup. Meats and other protein foods: Beef. Pork. Lamb. Poultry with skin. Hot dogs. [place]. Dairy: Ice cream. Sour cream. Whole milk. Beverages: Juice. Sugar-sweetened soft drinks. Beer. Liquor and spirits. Fats and oils: Butter. Canola oil. Vegetable oil. Beef fat (tallow). Lard. Sweets and desserts: Cookies. Cakes. Pies. Candy. Seasoning and other foods: Mayonnaise. Premade sauces and marinades. The items listed may not be a complete list. Talk with your  dietitian about what dietary choices are right for you. Summary The Mediterranean diet includes both food and lifestyle choices. Eat a variety of fresh fruits and vegetables, beans, nuts, seeds, and whole grains. Limit the amount of red meat and sweets that you eat. Talk with your health care provider about whether it is safe for you to drink red wine in moderation. This means 1 glass a day for nonpregnant women and 2 glasses a day for men. A glass of wine equals 5 oz (150 mL). This information is not intended to replace advice given to you by your health care provider. Make sure you discuss any questions you have with your health care provider. Document Released: 06/21/2016 Document Revised: 07/24/2016 Document Reviewed: 06/21/2016 Elsevier Interactive Patient Education  2017 [employer].

## 2024-10-19 NOTE — Progress Notes (Signed)
 Assessment & Plan  Memory impairment of unclear etiology, concern for vascular    Randy Merritt is a very pleasant 83 y.o. 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  Most recent MRI of the brain May 2025 is consistent with age-related cerebral atrophy with moderate to advanced chronic microvascular ischemic disease which certainly can contribute to the symptoms.  MoCA unable to be performed, MMSE 10/30. The patient is accompanied by his wife  who supplements  the history.       Start Donepezil  10 mg :Take half tablet (5 mg) daily for 2 weeks, then increase to the full tablet at 10 mg daily. Side effects discussed   Check B12 Secondary stroke prevention, with goal BP less than 140/90, LDL 70 and A1c less than 6  Recommend good control of cardiovascular risk factors.   Continue to control mood as per PCP Recommend using hearing  aids evaluation to improve comprehension    Discussed the use of AI scribe software for clinical note transcription with the patient, who gave verbal consent to proceed.  History of Present Illness Randy Merritt is an 83 year old male who presents with memory loss. He is accompanied by his wife, who assists with his care and provides additional information during the visit.  He has been experiencing memory changes for approximately two years, primarily affecting his short-term memory. HE has some issues with comprehension for several years, his wife denies this being related to hearing loss, more like attention. LTM is good, he recognizes family and friends but occasionally repeats stories. He has not taken  prescription memory medications but tried an over-the-counter supplement, Prevagen, in the past. No disorientation at home, leaving items in unusual places, or wandering tendencies. No personality changes, mood changes, or hallucinations, except when certain medications cause them. His wife assists with medication management and finances due to his health issues. He has a history of depression and anxiety, sleep issues for which he takes quetiapine .  He underwent thyroid  removal surgery in November 2024, which led to swallowing difficulties and a decreased appetite. Post-surgery, he has been on a soft diet with chopped foods. He completed speech therapy last month and sees an ENT regularly.  He does not have a history of chronic pain but takes medication for neck pain on his left side.  He experiences occasional constipation, managed with Metamucil, and has an enlarged prostate causing nocturia. He does not have a history of alcohol or tobacco use. There is no family history of dementia. He drives short distances and does not have any hygiene concerns. 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, chronic right frontal and right basal ganglia infarcts, no acute intracranial abnormalities   MRI of the brain without contrast 03/20/2024 without acute intracranial abnormality, but with age-related cerebral atrophy with moderate chronic microvascular ischemic disease, again showing few remote lacunar infarcts  MRA of the head and neck without abnormality, no LVO Results Recent labs TSH 0.415 with free T4 at 2.12, unremarkable CBC  Allergies  Allergen Reactions   Alprazolam  Other (See Comments)   Bupropion  Other (See Comments)   Bupropion  Hcl Er (Xl) Other (See Comments)   Cyclobenzaprine Other (See Comments)   Escitalopram Other (See  Comments)   Mirtazapine     Other Reaction(s): dizziness, fall risk, shortness of breath, and tingling sensation in arm   Prednisolone    Prednisone  Palpitations and Other (See Comments)    Current Outpatient Medications  Medication Instructions   acetaminophen  (TYLENOL ) 1,000 mg, Oral, Every 8 hours   ALPRAZolam  (XANAX ) 0.25 mg, 2 times daily   calcium  carbonate (TUMS) 500 MG chewable tablet 400 mg of elemental calcium , Oral, 3 times daily   citalopram  (CELEXA ) 10 mg, Daily   docusate sodium  (COLACE) 100 mg, Oral, 2 times daily   famotidine  (PEPCID ) 20 mg, Oral, 2 times daily   feeding supplement (ENSURE ENLIVE / ENSURE PLUS) LIQD 237 mLs, Oral, 3 times daily between meals   finasteride  (PROSCAR ) 5 mg, Daily   fluticasone  (FLONASE ) 50 MCG/ACT nasal spray 2 sprays, Daily PRN   levothyroxine  (SYNTHROID ) 100 mcg, Oral, Daily before breakfast   lidocaine  (XYLOCAINE ) 2 % solution 15 mLs, Mouth/Throat, As needed   morphine  (MSIR) 7.5 mg, Oral, Every 4 hours PRN   ondansetron  (ZOFRAN ) 4 mg, Oral, Every 6 hours PRN   ondansetron  (ZOFRAN -ODT) 4 MG disintegrating tablet 4mg  ODT q4 hours prn nausea/vomit   oxyCODONE  (ROXICODONE ) 5 mg, Oral, Every 4 hours PRN   pantoprazole  (PROTONIX ) 40 mg, Oral, 2 times daily before meals   QUEtiapine  (SEROQUEL ) 25 mg, Oral, Daily at bedtime   rosuvastatin  (CRESTOR ) 20 mg, Daily   timolol  (TIMOPTIC ) 0.5 % ophthalmic solution 1 drop, Daily   traMADol  (ULTRAM ) 50 mg, Oral, Every 6 hours PRN     VITALS:   Vitals:   10/19/24 0752  BP: 104/73  Pulse: 89  Resp: 20  SpO2: 98%  Weight: 126 lb (57.2 kg)  Height: 5' 8 (1.727 m)     Neurological Exam      No data to display             10/19/2024    8:00 AM  MMSE - Mini Mental State Exam  Orientation to time 2  Orientation to Place 3  Registration 0  Attention/ Calculation 0  Recall 0  Language- name 2 objects 2  Language- repeat 0  Language- follow 3 step command 3  Language- read & follow  direction 0  Write a sentence 0  Copy design 0  Total score 10       Orientation:  Alert and oriented to person, not to place or time . No aphasia or dysarthria. Fund of knowledge is appropriate. Recent and remote memory impaired.  Attention and concentration are reduced .  Able to name objects and unable to repeat phrases.   Delayed recall  0/3 Cranial nerves: There is good facial symmetry. Extraocular muscles are intact and visual fields are full to confrontational testing. Speech is  not very fluent, somewhat tangential, but clear. No tongue deviation. Hearing is  mildly decreased to conversational tone.  Tone: Tone is good throughout. Sensation: Sensation is intact to light touch.  Vibration is intact at the bilateral big toe.  Coordination: The patient has no difficulty with RAM's or FNF bilaterally. Normal finger to nose  Motor: Strength is 5/5 in the bilateral upper and lower extremities. There is no pronator drift. There are no fasciculations noted. DTR's: Deep tendon reflexes are 2/4 bilaterally. Gait and  Station: The patient is able to ambulate without difficulty. Gait is cautious and narrow. Stride length is normal.        Thank you for allowing us  the opportunity to participate in the care of this nice patient. Please do not hesitate to contact us  for any questions or concerns.   Total time spent on today's visit was 61 minutes dedicated to this patient today, preparing to see patient, examining the patient, ordering tests and/or medications and counseling the patient, documenting clinical information in the EHR or other health record, independently interpreting results and communicating results to the patient/family, discussing treatment and goals, answering patient's questions and coordinating care.  Cc:  Elliot Charm, MD  Camie Sevin 10/19/2024 8:41 AM

## 2024-10-20 ENCOUNTER — Ambulatory Visit: Payer: Self-pay | Admitting: Physician Assistant

## 2024-10-20 ENCOUNTER — Telehealth: Payer: Self-pay | Admitting: Physician Assistant

## 2024-10-20 ENCOUNTER — Other Ambulatory Visit: Payer: Self-pay | Admitting: Physician Assistant

## 2024-10-20 LAB — VITAMIN B12: Vitamin B-12: 701 pg/mL (ref 200–1100)

## 2024-10-20 MED ORDER — DONEPEZIL HCL 10 MG PO TABS: ORAL_TABLET | ORAL | 3 refills | Status: AC

## 2024-10-20 NOTE — Telephone Encounter (Signed)
 Pt's wife Virginia  called this morning and she stated that she was still waiting to get the new prescription prescribe for pt. Pt's  prescription can go to [facility] on [address] [place]. Thanks

## 2024-10-24 ENCOUNTER — Ambulatory Visit (HOSPITAL_COMMUNITY)

## 2024-10-24 ENCOUNTER — Encounter (HOSPITAL_COMMUNITY): Payer: Self-pay

## 2024-10-24 ENCOUNTER — Ambulatory Visit (HOSPITAL_COMMUNITY)
Admission: EM | Admit: 2024-10-24 | Discharge: 2024-10-24 | Disposition: A | Discharge status: Home / Self Care | Attending: Nurse Practitioner | Admitting: Nurse Practitioner

## 2024-10-24 DIAGNOSIS — R0602 Shortness of breath: Secondary | ICD-10-CM | POA: Diagnosis not present

## 2024-10-24 DIAGNOSIS — K224 Dyskinesia of esophagus: Secondary | ICD-10-CM

## 2024-10-24 DIAGNOSIS — R142 Eructation: Secondary | ICD-10-CM | POA: Diagnosis not present

## 2024-10-24 DIAGNOSIS — K219 Gastro-esophageal reflux disease without esophagitis: Secondary | ICD-10-CM | POA: Diagnosis not present

## 2024-10-24 DIAGNOSIS — R0789 Other chest pain: Secondary | ICD-10-CM | POA: Diagnosis not present

## 2024-10-24 DIAGNOSIS — R1314 Dysphagia, pharyngoesophageal phase: Secondary | ICD-10-CM

## 2024-10-24 MED ORDER — BACLOFEN 5 MG PO TABS: 2.5000 mg | ORAL_TABLET | Freq: Three times a day (TID) | ORAL | 0 refills | Status: DC

## 2024-10-24 MED ORDER — LANSOPRAZOLE 15 MG PO CPDR: 15.0000 mg | DELAYED_RELEASE_CAPSULE | Freq: Every day | ORAL | 0 refills | Status: DC

## 2024-10-24 MED ORDER — LIDOCAINE VISCOUS HCL 2 % MT SOLN
15.0000 mL | Freq: Once | OROMUCOSAL | Status: AC
  Administered 2024-10-24: 15 mL via OROMUCOSAL

## 2024-10-24 MED ORDER — LIDOCAINE VISCOUS HCL 2 % MT SOLN
OROMUCOSAL | Status: AC
  Filled 2024-10-24: qty 15

## 2024-10-24 MED ORDER — ALUM & MAG HYDROXIDE-SIMETH 200-200-20 MG/5ML PO SUSP
30.0000 mL | Freq: Once | ORAL | Status: AC
  Administered 2024-10-24: 30 mL via ORAL

## 2024-10-24 MED ORDER — LANSOPRAZOLE 15 MG PO CPDR: 15.0000 mg | DELAYED_RELEASE_CAPSULE | Freq: Every day | ORAL | 0 refills | Status: AC

## 2024-10-24 MED ORDER — ALUM & MAG HYDROXIDE-SIMETH 200-200-20 MG/5ML PO SUSP
ORAL | Status: AC
  Filled 2024-10-24: qty 30

## 2024-10-24 MED ORDER — BACLOFEN 5 MG PO TABS: 2.5000 mg | ORAL_TABLET | Freq: Three times a day (TID) | ORAL | 0 refills | Status: AC

## 2024-10-24 NOTE — ED Triage Notes (Addendum)
 Patient c/o SOB, nasal congestion, acid reflux, and fatigue x 1 week. SOB increases when talking or physical exertion. Wife stated that the patient was recently prescribed Donepizil 10 mg 1/2 tab. Wife was concerned about the medications side effects.

## 2024-10-24 NOTE — Discharge Instructions (Addendum)
 You were seen today for shortness of breath and throat discomfort. Your exam, chest X-ray, and heart tracing were reassuring and did not show signs of a lung or heart emergency. Based on your history and todays findings, your symptoms are most likely related to reflux and swallowing issues rather than a problem with your lungs or heart. Reflux and esophageal irritation can cause chest tightness, throat discomfort, frequent belching, and a sensation of shortness of breath even when oxygen levels are normal.  It is important to take your reflux medications exactly as prescribed. Start taking famotidine  twice daily as originally directed, and begin the new medication lansoprazole  once daily. These medicines work best when taken consistently and may take time to improve symptoms. A low dose of baclofen  has also been started to help reduce reflux and excessive belching. Continue following your swallowing precautions, including eating soft foods, taking small bites, chewing well, swallowing slowly, alternating bites with sips, and remaining upright after meals. Avoid large meals, alcohol, caffeine, and eating close to bedtime. Follow up with your primary care provider within the next two to three weeks to review how you are responding to the new medications and adjust doses if needed. Call on Monday to schedule the appointment. Citalopram  may also help with throat sensitivity. If no significant improvement in symptoms with the two new medicines prescribed today, ask neurologist about restarting citalopram  as it may also help with throat sensitivity. Seek emergency care right away if you develop worsening shortness of breath, chest pain, trouble swallowing or choking, drooling, voice changes, fever, low oxygen levels, or any new or rapidly worsening symptoms.

## 2024-10-24 NOTE — ED Provider Notes (Signed)
 [facility]    CSN: [id number] Arrival date & time: 10/24/24  1119      History   Chief Complaint Chief Complaint  Patient presents with   Shortness of Breath   Nasal Congestion   Fatigue    HPI Randy Merritt is a 83 y.o. male.   AI scribe software use for clinical note transcription was discussed with the patient and his wife, both of whom provided verbal consent to proceed. The history was primarily provided by the patient's wife, with the patient able to supplement details.  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 He presents with concerns of shortness of breath that has been present for approximately one week. The shortness of breath occurs both at rest and with activity and is associated with a sensation of chest tightness when taking deep breaths, though he denies true chest pain. He also reports rhinorrhea and nasal congestion for the past couple of weeks. He denies cough, fever, wheezing, or choking with eating or drinking. He further denies lower extremity swelling, palpitations, vomiting, diarrhea, or headaches.  The patient also reports a chronic left-sided sore throat that has persisted since thyroid  surgery in November of last year. He has known pharyngeal and esophageal dysphagia with ongoing swallowing difficulties and is currently following a diet of soft solids and thin liquids. He takes pills one at a time, alternating with bites and sips. He has participated in swallowing therapy but occasionally forgets to use learned techniques due to memory impairment. He experiences frequent belching, which may be related to reflux. He takes Pepcid  daily, though  review of the medication list indicates it was prescribed twice daily; his wife reports she was unaware of this. He was previously prescribed pantoprazole  twice daily by Dr. Tobie in July 2024 but discontinued it after approximately one month due to lack of symptom improvement.  The patient was recently started on Aricept  for memory impairment thought to be related to prior vascular strokes. His wife reports he never experienced symptoms suggestive of an acute stroke; however, chronic infarcts were noted on brain MRI studies from 2021 and May 2025. Recent care includes evaluation by Dr. Floretta, otolaryngology, in September, dysphagia therapy with speech-language pathology, and a neurology visit on October 19, 2024, at which time Aricept  was initiated for memory concerns.  The following sections of the patient's history were reviewed and updated as appropriate: allergies, current medications, past family history, past medical history, past social history, past surgical history, and problem list.         Past Medical History:  Diagnosis Date   Acid reflux    Anxiety    Arthritis    Depression     a little   Goiter    High cholesterol    History of blood transfusion    History of kidney stones    Memory loss    Mild per wife   Perforated bowel (HCC)    Seasonal allergies    Stroke [facility])    showed on Scan per wife no symptoms    Patient Active Problem List   Diagnosis Date Noted   Multiple thyroid  nodules 09/27/2023   Tracheal deviation 09/27/2023   Cervical osteophyte 09/09/2023   Malnutrition of moderate degree 06/07/2023   Anxiety and depression 06/04/2023  Dysphagia 06/03/2023   Atypical angina 03/12/2022   Allergic rhinitis due to pollen 02/12/2022   Chronic pain 02/12/2022   Memory impairment 02/12/2022   Moderate recurrent major depression (HCC) 02/12/2022   Non-toxic multinodular goiter 02/12/2022   Primary insomnia 02/12/2022   Recurrent major depression in  remission 02/12/2022   Thrombocytopenia 02/12/2022   Chronic constipation 02/15/2021   Cervical radiculopathy 01/24/2021   S/P cervical spinal fusion 01/24/2021   Essential (primary) hypertension 01/16/2021   Neck pain 01/16/2021   Aphasia 07/01/2020   TIA (transient ischemic attack) 06/29/2020   Globus pharyngeus 05/20/2020   Goiter 12/24/2019   Perforated sigmoid colon (HCC) 10/06/2019   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/2019   Anxiety 04/14/2018   BPH (benign prostatic hyperplasia) 04/14/2018   CKD (chronic kidney disease) stage 3, GFR 30-59 ml/min (HCC) 04/14/2018   History of adenomatous polyp of colon 04/14/2018   Hyperlipidemia 04/14/2018   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   Ingrown toenail 10/11/2017   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   Oropharyngeal dysphagia 02/02/2017   Chronic rhinitis 02/01/2017   Gastroesophageal reflux disease 02/01/2017    Past Surgical History:  Procedure Laterality Date   ANTERIOR CERVICAL DECOMP/DISCECTOMY FUSION N/A 04/08/2013   Procedure: ANTERIOR CERVICAL DECOMPRESSION/DISCECTOMY FUSION 1 LEVEL;  Surgeon: Catalina CHRISTELLA Stains, MD;  Location: [facility];  Service: Neurosurgery;  Laterality: N/A;  Cervical five-six Anterior cervical decompression/diskectomy fusion   APPENDECTOMY N/A 10/06/2019   Procedure: APPENDECTOMY;  Surgeon: Signe Mitzie LABOR, MD;  Location: [facility] OR;  Service: General;  Laterality: N/A;   BIOPSY  01/30/2019   Procedure: BIOPSY;  Surgeon: Rollin Dover, MD;  Location: [facility];  Service: Endoscopy;;   CERVICAL DISCECTOMY     x2   CHOLECYSTECTOMY     COLON RESECTION SIGMOID N/A 10/06/2019   Procedure: COLON RESECTION SIGMOID;  Surgeon: Signe Mitzie LABOR, MD;  Location: [facility];  Service: General;  Laterality: N/A;   ESOPHAGEAL DILATION  01/30/2019   Procedure: ESOPHAGEAL DILATION;  Surgeon: Rollin Dover, MD;  Location: [facility];  Service: Endoscopy;;  [place]    ESOPHAGOGASTRODUODENOSCOPY (EGD) WITH PROPOFOL  N/A 01/30/2019   Procedure: ESOPHAGOGASTRODUODENOSCOPY (EGD) WITH PROPOFOL ;  Surgeon: Rollin Dover, MD;  Location: [facility];  Service: Endoscopy;  Laterality: N/A;   gallstone removal     LAPAROTOMY N/A 10/06/2019   Procedure: EXPLORATORY LAPAROTOMY;  Surgeon: Signe Mitzie LABOR, MD;  Location: [facility];  Service: General;  Laterality: N/A;   THYROIDECTOMY N/A 09/27/2023   Procedure: TOTAL THYROIDECTOMY;  Surgeon: Eletha Boas, MD;  Location: [facility];  Service: General;  Laterality: N/A;       Home Medications    Prior to Admission medications  Medication Sig Start Date End Date Taking? Authorizing Provider  acetaminophen  (TYLENOL ) 500 MG tablet Take 2 tablets (1,000 mg total) by mouth every 8 (eight) hours. 10/15/19   Tonnie George, PA-C  ALPRAZolam  (XANAX ) 0.25 MG tablet Take 0.25 mg by mouth in the morning and at bedtime. Patient not taking: Reported on 10/19/2024    [provider]  Baclofen  5 MG TABS Take 0.5 tablets (2.5 mg total) by mouth with breakfast, with lunch, and with evening meal. 10/24/24   Iola Lukes, FNP  calcium  carbonate (TUMS) 500 MG chewable tablet Chew 2 tablets (400 mg of elemental calcium  total) by mouth 3 (three) times daily. 09/27/23   Eletha Boas, MD  citalopram  (CELEXA ) 10 MG tablet Take 10 mg by mouth daily.    [provider]  docusate  sodium (COLACE) 100 MG capsule Take 1 capsule (100 mg total) by mouth 2 (two) times daily. 06/11/23   Tobie Yetta HERO, MD  donepezil  (ARICEPT ) 10 MG tablet Take half tablet (5 mg) daily for 2 weeks, then increase to the full tablet at 10 mg daily 10/20/24   Wertman, Sara E, PA-C  famotidine  (PEPCID ) 20 MG tablet Take 1 tablet (20 mg total) by mouth 2 (two) times daily. Patient not taking: Reported on 10/19/2024 08/15/23   Soldatova, [place], MD  feeding supplement (ENSURE ENLIVE / ENSURE PLUS) LIQD Take 237 mLs by mouth 3 (three) times daily between meals.  06/11/23   Tobie Yetta HERO, MD  finasteride  (PROSCAR ) 5 MG tablet Take 5 mg by mouth daily.    [provider]  fluticasone  (FLONASE ) 50 MCG/ACT nasal spray Place 2 sprays into both nostrils daily as needed for allergies.    [provider]  lansoprazole  (PREVACID ) 15 MG capsule Take 1 capsule (15 mg total) by mouth daily at 12 noon. 10/24/24   Deyonna Fitzsimmons, FNP  levothyroxine  (SYNTHROID ) 100 MCG tablet Take 1 tablet (100 mcg total) by mouth daily before breakfast. Patient not taking: Reported on 10/19/2024 09/27/23   Eletha Boas, MD  lidocaine  (XYLOCAINE ) 2 % solution Use as directed 15 mLs in the mouth or throat as needed for mouth pain. 05/22/24   Nivia Colon, PA-C  morphine  (MSIR) 15 MG tablet Take 0.5 tablets (7.5 mg total) by mouth every 4 (four) hours as needed for severe pain (pain score 7-10). 01/10/24   Emil Share, DO  ondansetron  (ZOFRAN ) 4 MG tablet Take 1 tablet (4 mg total) by mouth every 6 (six) hours as needed for nausea. 06/11/23   Tobie Yetta HERO, MD  QUEtiapine  (SEROQUEL ) 25 MG tablet Take 1 tablet (25 mg total) by mouth at bedtime. 06/11/23   Tobie Yetta HERO, MD  rosuvastatin  (CRESTOR ) 20 MG tablet Take 20 mg by mouth daily. 05/26/21   [provider]  timolol  (TIMOPTIC ) 0.5 % ophthalmic solution Place 1 drop into both eyes daily.    [provider]    Family History Family History  Problem Relation Age of Onset   Healthy Mother    Healthy Father    Hypertension Sister    Hypertension Sister    Diabetes Sister    Hypertension Brother    Thyroid  disease Neg Hx     Social History Social History[1]   Allergies   Alprazolam , Bupropion , Bupropion  hcl er (xl), Cyclobenzaprine, Escitalopram, Mirtazapine, Prednisolone, and Prednisone    Review of Systems Review of Systems  Constitutional:  Negative for fever.  HENT:  Positive for congestion, rhinorrhea (for a couple of weeks), sore throat (left sided, chronic & unchanged) and trouble  swallowing (chronic & unchanged)).   Respiratory:  Positive for chest tightness (when taking a deep breath) and shortness of breath (with talking and exertion). Negative for cough and choking.   Cardiovascular:  Negative for chest pain, palpitations and leg swelling.  Gastrointestinal:  Negative for diarrhea, nausea and vomiting.  Neurological:  Negative for headaches.  All other systems reviewed and are negative.    Physical Exam Triage Vital Signs ED Triage Vitals [10/24/24 1236]  Encounter Vitals Group     BP (!) 90/52     Girls Systolic BP Percentile      Girls Diastolic BP Percentile      Boys Systolic BP Percentile      Boys Diastolic BP Percentile      Pulse Rate 93  Resp 16     Temp 98.5 F (36.9 C)     Temp Source Oral     SpO2 98 %     Weight      Height      Head Circumference      Peak Flow      Pain Score 0     Pain Loc      Pain Education      Exclude from Growth Chart    No data found.  Updated Vital Signs BP 110/76 (BP Location: Right Arm)   Pulse 93   Temp 98.5 F (36.9 C) (Oral)   Resp 16   SpO2 98%   Visual Acuity Right Eye Distance:   Left Eye Distance:   Bilateral Distance:    Right Eye Near:   Left Eye Near:    Bilateral Near:     Physical Exam Vitals reviewed.  Constitutional:      General: He is awake. He is not in acute distress.    Appearance: Normal appearance. He is well-developed. He is not ill-appearing, toxic-appearing or diaphoretic.     Comments: Elderly male, pleasant, sitting comfortably and in no acute distress but frequently will grab the left side of his neck due to discomfort   HENT:     Head: Normocephalic.     Right Ear: Hearing normal.     Left Ear: Hearing normal.     Nose: Nose normal.     Mouth/Throat:     Mouth: Mucous membranes are moist.     Pharynx: Oropharynx is clear. Uvula midline. No pharyngeal swelling or posterior oropharyngeal erythema.  Eyes:     General: Vision grossly intact.      Conjunctiva/sclera: Conjunctivae normal.  Neck:     Trachea: Trachea and phonation normal.     Comments: Left-sided neck discomfort is noted without associated edema, rigidity, or crepitus. There is no swelling, and range of motion is full without restriction. Cardiovascular:     Rate and Rhythm: Normal rate and regular rhythm.     Pulses: Normal pulses.     Heart sounds: Normal heart sounds.  Pulmonary:     Effort: Pulmonary effort is normal. No tachypnea or respiratory distress.     Breath sounds: Normal breath sounds and air entry. No decreased air movement. No decreased breath sounds or wheezing.     Comments: Respirations even and unlabored  Abdominal:     Palpations: Abdomen is soft.     Tenderness: There is no abdominal tenderness.  Musculoskeletal:        General: Normal range of motion.     Cervical back: Normal range of motion and neck supple. No edema, rigidity or crepitus. Normal range of motion.     Right lower leg: No edema.     Left lower leg: No edema.  Lymphadenopathy:     Cervical: No cervical adenopathy.  Skin:    General: Skin is warm and dry.  Neurological:     General: No focal deficit present.     Mental Status: He is alert and oriented to person, place, and time.     Cranial Nerves: No dysarthria.     Sensory: Sensation is intact.     Motor: Motor function is intact.     Gait: Gait is intact.  Psychiatric:        Speech: Speech normal.        Behavior: Behavior is cooperative.      UC Treatments / Results  Labs (all labs ordered are listed, but only abnormal results are displayed) Labs Reviewed - No data to display  EKG   Radiology DG Chest 2 View Result Date: 10/24/2024 EXAM: 2 VIEW(S) XRAY OF THE CHEST 10/24/2024 02:05:53 PM COMPARISON: CXR dated 07/05/2023 and CT chest dated 03/30/2022. CLINICAL HISTORY: shortness of breath, hx GERD \\T \ swallowing issues FINDINGS: LUNGS AND PLEURA: No focal pulmonary opacity. No pleural effusion. No  pneumothorax. HEART AND MEDIASTINUM: No acute abnormality of the cardiac and mediastinal silhouettes. BONES AND SOFT TISSUES: Cholecystectomy clips noted. Thoracic degenerative changes. No acute osseous abnormality. IMPRESSION: 1. No acute cardiopulmonary findings. Electronically signed by: Kate Plummer MD 10/24/2024 02:31 PM EST RP Workstation: [id number]    Procedures ED EKG  Date/Time: 10/24/2024 5:07 PM  Performed by: Iola Lukes, FNP Authorized by: Iola Lukes, FNP   Previous ECG:    Previous ECG:  Compared to current   Comparison ECG info:  Improved from July 2025 Interpretation:    Interpretation: normal   Rate:    ECG rate assessment: normal   Rhythm:    Rhythm: sinus rhythm   QRS:    QRS axis:  Normal   QRS intervals:  Normal   QRS conduction: normal   ST segments:    ST segments:  Normal T waves:    T waves: normal   Q waves:    Abnormal Q-waves: not present    (including critical care time)  Medications Ordered in UC Medications  lidocaine  (XYLOCAINE ) 2 % viscous mouth solution 15 mL (15 mLs Mouth/Throat Given 10/24/24 1410)  alum & mag hydroxide-simeth (MAALOX/MYLANTA) 200-200-20 MG/5ML suspension 30 mL (30 mLs Oral Given 10/24/24 1410)    Initial Impression / Assessment and Plan / UC Course  I have reviewed the triage vital signs and the nursing notes.  Pertinent labs & imaging results that were available during my care of the patient were reviewed by me and considered in my medical decision making (see chart for details).     The patient is a male with a complex medical history who presents with approximately one week of shortness of breath occurring both at rest and with activity, accompanied by chest tightness with deep inspiration but no true chest pain. He also reports several weeks of nasal congestion and rhinorrhea. He is afebrile, non-toxic appearing, normotensive, and saturating well on room air. Physical exam is reassuring with normal  phonation, a normal oropharyngeal exam, mild left-sided neck discomfort without swelling or limitation of range of motion, and clear lungs without stridor, wheezing, tachypnea, or respiratory distress. Chest X-ray shows no acute cardiopulmonary findings, and EKG is unremarkable and improved compared to prior tracing from July 2025. Overall, there is low suspicion for acute cardiopulmonary pathology such as pneumonia, pulmonary embolism, pneumothorax, or acute coronary syndrome at this time.  Given the patients known history of pharyngoesophageal dysphagia, esophageal dysmotility, and probable GERD, his symptoms are most consistent with reflux-related disease and upper esophageal or pharyngeal irritation contributing to dyspnea sensation, chest tightness, chronic sore throat, belching, and possible esophageal hypervigilance. GI cocktail was given in clinic with some subjective improvement, although not complete resolution, in symptoms.   He has been prescribed famotidine  20 mg twice daily but has only been taking it once daily, which may be contributing to persistent symptoms. He was advised to resume famotidine  twice daily as prescribed. Although he previously discontinued pantoprazole  due to perceived lack of benefit, proton pump inhibitor therapy remains indicated, and lansoprazole  was initiated. Review of prior FEES  from September 15, 2024, showed frequent belching for symptom relief and difficulty suppressing this behavior, raising concern for possible bile reflux or a gut-brain interaction disorder. A low-dose trial of baclofen  was initiated to address these possibilities. Esophageal hypervigilance may also be contributing to his chronic neck discomfort, and low-dose SSRI therapy may be beneficial as he was previously been prescribed citalopram  but wife reports that he no longer takes this as she believes it was discontinued by psychiatry due to memory loss.   The patient and wife was strongly advised to  follow up with his primary care provider within the next two-three weeks to assess response to therapy and adjust medications as needed. Call on Monday to make appointment. Advised to consult with neurology in regards to re-introducing SSRI if therapies initiated today doesn't provide any improvement in symptoms.   Patient and wife was instructed to seek emergency care for worsening shortness of breath, new chest pain, inability to swallow, choking, drooling, voice changes, fever, hypoxia, or any new or concerning symptoms.  Today's evaluation has revealed no signs of a dangerous process. Discussed diagnosis with patient and/or guardian. Patient and/or guardian aware of their diagnosis, possible red flag symptoms to watch out for and need for close follow up. Patient and/or guardian understands verbal and written discharge instructions. Patient and/or guardian comfortable with plan and disposition.  Patient and/or guardian has a clear mental status at this time, good insight into illness (after discussion and teaching) and has clear judgment to make decisions regarding their care  Documentation was completed with the aid of voice recognition software. Transcription may contain typographical errors.  Final Clinical Impressions(s) / UC Diagnoses   Final diagnoses:  Shortness of breath  Sensation of chest tightness  Dysphagia, pharyngoesophageal phase  Esophageal dysmotility  Gastroesophageal reflux disease without esophagitis  Eructation     Discharge Instructions      You were seen today for shortness of breath and throat discomfort. Your exam, chest X-ray, and heart tracing were reassuring and did not show signs of a lung or heart emergency. Based on your history and todays findings, your symptoms are most likely related to reflux and swallowing issues rather than a problem with your lungs or heart. Reflux and esophageal irritation can cause chest tightness, throat discomfort, frequent  belching, and a sensation of shortness of breath even when oxygen levels are normal.  It is important to take your reflux medications exactly as prescribed. Start taking famotidine  twice daily as originally directed, and begin the new medication lansoprazole  once daily. These medicines work best when taken consistently and may take time to improve symptoms. A low dose of baclofen  has also been started to help reduce reflux and excessive belching. Continue following your swallowing precautions, including eating soft foods, taking small bites, chewing well, swallowing slowly, alternating bites with sips, and remaining upright after meals. Avoid large meals, alcohol, caffeine, and eating close to bedtime. Follow up with your primary care provider within the next two to three weeks to review how you are responding to the new medications and adjust doses if needed. Call on Monday to schedule the appointment. Citalopram  may also help with throat sensitivity. If no significant improvement in symptoms with the two new medicines prescribed today, ask neurologist about restarting citalopram  as it may also help with throat sensitivity. Seek emergency care right away if you develop worsening shortness of breath, chest pain, trouble swallowing or choking, drooling, voice changes, fever, low oxygen levels, or any new or rapidly worsening  symptoms.     ED Prescriptions     Medication Sig Dispense Auth. Provider   baclofen  5 MG TABS Take 0.5 tablets (2.5 mg total) by mouth with breakfast, with lunch, and with evening meal. 45 tablet Iola Lukes, FNP   lansoprazole  (PREVACID ) 15 MG capsule Take 1 capsule (15 mg total) by mouth daily at 12 noon. 30 capsule Iola Lukes, FNP      PDMP not reviewed this encounter.     [1]  Social History Tobacco Use   Smoking status: Former    Current packs/day: 0.00    Average packs/day: 0.3 packs/day for 1 year (0.3 ttl pk-yrs)    Types: Cigarettes    Start date:  29    Quit date: 78    Years since quitting: 65.9    Passive exposure: Never   Smokeless tobacco: Never  Vaping Use   Vaping status: Never Used  Substance Use Topics   Alcohol use: No   Drug use: No     Iola Lukes, FNP 10/24/24 1722

## 2024-10-25 ENCOUNTER — Encounter (HOSPITAL_COMMUNITY): Payer: Self-pay | Admitting: *Deleted

## 2024-10-25 ENCOUNTER — Other Ambulatory Visit: Payer: Self-pay

## 2024-10-25 ENCOUNTER — Emergency Department (HOSPITAL_COMMUNITY)
Admission: EM | Admit: 2024-10-25 | Discharge: 2024-10-25 | Disposition: A | Discharge status: Home / Self Care | Source: Home / Self Care | Attending: Emergency Medicine | Admitting: Emergency Medicine

## 2024-10-25 DIAGNOSIS — R1013 Epigastric pain: Secondary | ICD-10-CM | POA: Diagnosis not present

## 2024-10-25 DIAGNOSIS — R9431 Abnormal electrocardiogram [ECG] [EKG]: Secondary | ICD-10-CM | POA: Diagnosis not present

## 2024-10-25 DIAGNOSIS — R109 Unspecified abdominal pain: Secondary | ICD-10-CM

## 2024-10-25 LAB — COMPREHENSIVE METABOLIC PANEL WITH GFR
ALT: 22 U/L (ref 0–44)
AST: 25 U/L (ref 15–41)
Albumin: 4.1 g/dL (ref 3.5–5.0)
Alkaline Phosphatase: 96 U/L (ref 38–126)
Anion gap: 10 (ref 5–15)
BUN: 22 mg/dL (ref 8–23)
CO2: 25 mmol/L (ref 22–32)
Calcium: 9.5 mg/dL (ref 8.9–10.3)
Chloride: 107 mmol/L (ref 98–111)
Creatinine, Ser: 1.35 mg/dL — ABNORMAL HIGH (ref 0.61–1.24)
GFR, Estimated: 52 mL/min — ABNORMAL LOW (ref >60)
Glucose, Bld: 110 mg/dL — ABNORMAL HIGH (ref 70–99)
Potassium: 3.9 mmol/L (ref 3.5–5.1)
Sodium: 142 mmol/L (ref 135–145)
Total Bilirubin: 0.9 mg/dL (ref 0.0–1.2)
Total Protein: 7.1 g/dL (ref 6.5–8.1)

## 2024-10-25 LAB — CBC
HCT: 36 % — ABNORMAL LOW (ref 39.0–52.0)
Hemoglobin: 12.1 g/dL — ABNORMAL LOW (ref 13.0–17.0)
MCH: 31.9 pg (ref 26.0–34.0)
MCHC: 33.6 g/dL (ref 30.0–36.0)
MCV: 95 fL (ref 80.0–100.0)
Platelets: 158 K/uL (ref 150–400)
RBC: 3.79 MIL/uL — ABNORMAL LOW (ref 4.22–5.81)
RDW: 12.7 % (ref 11.5–15.5)
WBC: 5 K/uL (ref 4.0–10.5)
nRBC: 0 % (ref 0.0–0.2)

## 2024-10-25 LAB — LIPASE, BLOOD: Lipase: 25 U/L (ref 11–51)

## 2024-10-25 MED ORDER — FAMOTIDINE IN NACL 20-0.9 MG/50ML-% IV SOLN: 20.0000 mg | Freq: Once | INTRAVENOUS | Status: DC

## 2024-10-25 MED ORDER — ONDANSETRON 4 MG PO TBDP: 4.0000 mg | ORAL_TABLET | Freq: Three times a day (TID) | ORAL | 0 refills | Status: AC | PRN

## 2024-10-25 MED ORDER — ONDANSETRON 4 MG PO TBDP
4.0000 mg | ORAL_TABLET | Freq: Once | ORAL | Status: AC | PRN
  Administered 2024-10-25: 4 mg via ORAL
  Filled 2024-10-25: qty 1

## 2024-10-25 MED ORDER — SUCRALFATE 1 GM/10ML PO SUSP: 1.0000 g | Freq: Three times a day (TID) | ORAL | Status: DC

## 2024-10-25 MED ORDER — SUCRALFATE 1 GM/10ML PO SUSP: 1.0000 g | Freq: Once | ORAL | Status: DC

## 2024-10-25 NOTE — ED Provider Notes (Signed)
 [place] [facility]ERGENCY DEPARTMENT AT [facility] Provider Note   CSN: [id number] Arrival date & time: 10/25/24  1355     Patient presents with: Emesis   Randy Merritt is a 83 y.o. male with a history of dementia presenting to ED with complaint of nausea and vomiting.  Patient was seen in the urgent care yesterday with complaint of persistent sore throat for about a year since the thyroid  surgery.  He has ongoing known esophageal dysphagia and swallowing difficulties.  He was seen also for nausea in the urgent care.  He tells me that this morning he was vomiting, and felt his stomach was quite upset.  His wife encouraged him to call 911.  His wife is currently at church and not available  Update: His wife later presented to the ED and was able to provide supplemental history.  She reports the patient has not been having any nausea or vomiting.  She was concerned because he may have taken an extra dose of his medication this morning.  She told him if he was feeling unwell he should call 911, because she was at church at time.  She clarifies that he has had chronic pain in his throat for over a year, dysphagia issues, that this is not new.  Patient is a level 5 caveat, very poor historian, difficulty providing me a clear consistent history.   HPI     Prior to Admission medications  Medication Sig Start Date End Date Taking? Authorizing Provider  ondansetron  (ZOFRAN -ODT) 4 MG disintegrating tablet Take 1 tablet (4 mg total) by mouth every 8 (eight) hours as needed for up to 12 doses for nausea or vomiting. 10/25/24  Yes Thorin Starner, Donnice PARAS, MD  acetaminophen  (TYLENOL ) 500 MG tablet Take 2 tablets (1,000 mg total) by mouth every 8 (eight) hours. 10/15/19   Tonnie George, PA-C  ALPRAZolam  (XANAX ) 0.25 MG tablet Take 0.25 mg by mouth in the morning and at bedtime. Patient not taking: Reported on 10/19/2024    [provider]  Baclofen  5 MG TABS Take 0.5 tablets (2.5 mg total)  by mouth with breakfast, with lunch, and with evening meal. 10/24/24   Iola Lukes, FNP  calcium  carbonate (TUMS) 500 MG chewable tablet Chew 2 tablets (400 mg of elemental calcium  total) by mouth 3 (three) times daily. 09/27/23   Eletha Boas, MD  citalopram  (CELEXA ) 10 MG tablet Take 10 mg by mouth daily.    [provider]  docusate sodium  (COLACE) 100 MG capsule Take 1 capsule (100 mg total) by mouth 2 (two) times daily. 06/11/23   Tobie Yetta HERO, MD  donepezil  (ARICEPT ) 10 MG tablet Take half tablet (5 mg) daily for 2 weeks, then increase to the full tablet at 10 mg daily 10/20/24   Wertman, Sara E, PA-C  famotidine  (PEPCID ) 20 MG tablet Take 1 tablet (20 mg total) by mouth 2 (two) times daily. Patient not taking: Reported on 10/19/2024 08/15/23   Soldatova, [place], MD  feeding supplement (ENSURE ENLIVE / ENSURE PLUS) LIQD Take 237 mLs by mouth 3 (three) times daily between meals. 06/11/23   Tobie Yetta HERO, MD  finasteride  (PROSCAR ) 5 MG tablet Take 5 mg by mouth daily.    [provider]  fluticasone  (FLONASE ) 50 MCG/ACT nasal spray Place 2 sprays into both nostrils daily as needed for allergies.    [provider]  lansoprazole  (PREVACID ) 15 MG capsule Take 1 capsule (15 mg total) by mouth daily at 12 noon. 10/24/24  Iola Lukes, FNP  levothyroxine  (SYNTHROID ) 100 MCG tablet Take 1 tablet (100 mcg total) by mouth daily before breakfast. Patient not taking: Reported on 10/19/2024 09/27/23   Eletha Boas, MD  lidocaine  (XYLOCAINE ) 2 % solution Use as directed 15 mLs in the mouth or throat as needed for mouth pain. 05/22/24   Nivia Colon, PA-C  morphine  (MSIR) 15 MG tablet Take 0.5 tablets (7.5 mg total) by mouth every 4 (four) hours as needed for severe pain (pain score 7-10). 01/10/24   Emil Share, DO  ondansetron  (ZOFRAN ) 4 MG tablet Take 1 tablet (4 mg total) by mouth every 6 (six) hours as needed for nausea. 06/11/23   Tobie Yetta HERO, MD  QUEtiapine   (SEROQUEL ) 25 MG tablet Take 1 tablet (25 mg total) by mouth at bedtime. 06/11/23   Tobie Yetta HERO, MD  rosuvastatin  (CRESTOR ) 20 MG tablet Take 20 mg by mouth daily. 05/26/21   [provider]  timolol  (TIMOPTIC ) 0.5 % ophthalmic solution Place 1 drop into both eyes daily.    [provider]    Allergies: Alprazolam , Bupropion , Bupropion  hcl er (xl), Cyclobenzaprine, Escitalopram, Mirtazapine, Prednisolone, and Prednisone     Review of Systems  Updated Vital Signs BP 135/89 (BP Location: Left Arm)   Pulse 84   Temp (!) 97.5 F (36.4 C)   Resp 16   Ht 5' 8 (1.727 m)   Wt 57.2 kg   SpO2 100%   BMI 19.16 kg/m   Physical Exam Constitutional:      General: He is not in acute distress. HENT:     Head: Normocephalic and atraumatic.  Eyes:     Conjunctiva/sclera: Conjunctivae normal.     Pupils: Pupils are equal, round, and reactive to light.  Cardiovascular:     Rate and Rhythm: Normal rate and regular rhythm.  Pulmonary:     Effort: Pulmonary effort is normal. No respiratory distress.  Abdominal:     General: There is no distension.     Tenderness: There is no abdominal tenderness.  Skin:    General: Skin is warm and dry.  Neurological:     General: No focal deficit present.     Mental Status: He is alert. Mental status is at baseline.     (all labs ordered are listed, but only abnormal results are displayed) Labs Reviewed  COMPREHENSIVE METABOLIC PANEL WITH GFR - Abnormal; Notable for the following components:      Result Value   Glucose, Bld 110 (*)    Creatinine, Ser 1.35 (*)    GFR, Estimated 52 (*)    All other components within normal limits  CBC - Abnormal; Notable for the following components:   RBC 3.79 (*)    Hemoglobin 12.1 (*)    HCT 36.0 (*)    All other components within normal limits  LIPASE, BLOOD    EKG: EKG Interpretation Date/Time:  Sunday October 25 2024 15:10:07 EST Ventricular Rate:  86 PR Interval:  144 QRS  Duration:  70 QT Interval:  370 QTC Calculation: 442 R Axis:   80  Text Interpretation: Normal sinus rhythm Right atrial enlargement When compared with ECG of 22-May-2024 10:21, PREVIOUS ECG IS PRESENT Confirmed by Cottie Cough [phone]) on 10/25/2024 4:12:36 PM  Radiology: [facility] Chest 2 View Result Date: 10/24/2024 EXAM: 2 VIEW(S) XRAY OF THE CHEST 10/24/2024 02:05:53 PM COMPARISON: CXR dated 07/05/2023 and CT chest dated 03/30/2022. CLINICAL HISTORY: shortness of breath, hx GERD \\T \ swallowing issues FINDINGS: LUNGS AND PLEURA: No focal  pulmonary opacity. No pleural effusion. No pneumothorax. HEART AND MEDIASTINUM: No acute abnormality of the cardiac and mediastinal silhouettes. BONES AND SOFT TISSUES: Cholecystectomy clips noted. Thoracic degenerative changes. No acute osseous abnormality. IMPRESSION: 1. No acute cardiopulmonary findings. Electronically signed by: Morgane Naveau MD 10/24/2024 02:31 PM EST RP Workstation: [id number]     Procedures   Medications Ordered in the ED  famotidine  (PEPCID ) IVPB 20 mg premix (has no administration in time range)  ondansetron  (ZOFRAN -ODT) disintegrating tablet 4 mg (4 mg Oral Given 10/25/24 1457)                                    Medical Decision Making Amount and/or Complexity of Data Reviewed Labs: ordered. Radiology: ordered.  Risk Prescription drug management.   Patient was initially presenting and being worked up for potential nausea and vomiting, however upon further information provided by his wife in the ED, this appears not to have been the case.  He has tolerating p.o.  There was concern that he may have taken an extra dose of his baclofen  this morning, but he has now been observed for multiple hours in the ED and is at baseline mental status.  I do not think any further workup is needed at this time.   Co-morbidities that complicate the patient evaluation: Known history of esophageal dysmotility  Additional history obtained  from patient's wife  External records from outside source obtained and reviewed including outside records showing known history of pharyngeal dysphagia, moderate esophageal dysmotility, esophageal dilatation in the past, thyroidectomy in November 2024, follows at [facility] had a neck surgery  I ordered and personally interpreted labs.  The pertinent results include: No emergent findings  CT imaging canceled as I have a low suspicion for intra-abdominal infectious or emergency process.  Per my interpretation the patient's ECG shows acute ischemia  I ordered medication including gastritis and GI medication  I have reviewed the patients home medicines and have made adjustments as needed   Dispostion:  After consideration of the diagnostic results and the patients response to treatment, I feel that the patent would benefit from outpatient follow-up.      Final diagnoses:  Abdominal discomfort    ED Discharge Orders          Ordered    ondansetron  (ZOFRAN -ODT) 4 MG disintegrating tablet  Every 8 hours PRN        10/25/24 1756               Cottie Donnice PARAS, MD 10/25/24 1758

## 2024-10-25 NOTE — ED Triage Notes (Addendum)
 Pt here via [facility]  from home for emesis.  He was seen at [facility] yesterday and discharged with meds for GERD as well as Baclofen .  Pt took the medication this am and then began vomiting.  States emesis x 4 times this morning.  Denies chest pain, sob, just states my stomach feels weak after vomiting.  Per PT[facility], wife states pt has memory problems, but can be brought to and she will come once she is home form church.

## 2024-10-26 DIAGNOSIS — R109 Unspecified abdominal pain: Secondary | ICD-10-CM | POA: Diagnosis not present

## 2024-11-20 ENCOUNTER — Inpatient Hospital Stay

## 2024-11-20 ENCOUNTER — Inpatient Hospital Stay: Attending: Oncology | Admitting: Oncology

## 2024-11-20 VITALS — BP 109/67 | HR 97 | Temp 97.3°F | Resp 17 | Wt 118.1 lb

## 2024-11-20 DIAGNOSIS — I129 Hypertensive chronic kidney disease with stage 1 through stage 4 chronic kidney disease, or unspecified chronic kidney disease: Secondary | ICD-10-CM | POA: Diagnosis not present

## 2024-11-20 DIAGNOSIS — Z79890 Hormone replacement therapy (postmenopausal): Secondary | ICD-10-CM | POA: Diagnosis not present

## 2024-11-20 DIAGNOSIS — D649 Anemia, unspecified: Secondary | ICD-10-CM | POA: Diagnosis not present

## 2024-11-20 DIAGNOSIS — Z87891 Personal history of nicotine dependence: Secondary | ICD-10-CM | POA: Insufficient documentation

## 2024-11-20 DIAGNOSIS — N1831 Chronic kidney disease, stage 3a: Secondary | ICD-10-CM | POA: Insufficient documentation

## 2024-11-20 DIAGNOSIS — Z9049 Acquired absence of other specified parts of digestive tract: Secondary | ICD-10-CM | POA: Insufficient documentation

## 2024-11-20 DIAGNOSIS — Z79899 Other long term (current) drug therapy: Secondary | ICD-10-CM | POA: Insufficient documentation

## 2024-11-20 DIAGNOSIS — Z888 Allergy status to other drugs, medicaments and biological substances status: Secondary | ICD-10-CM | POA: Insufficient documentation

## 2024-11-20 DIAGNOSIS — E7800 Pure hypercholesterolemia, unspecified: Secondary | ICD-10-CM | POA: Insufficient documentation

## 2024-11-20 DIAGNOSIS — Z993 Dependence on wheelchair: Secondary | ICD-10-CM | POA: Insufficient documentation

## 2024-11-20 DIAGNOSIS — E890 Postprocedural hypothyroidism: Secondary | ICD-10-CM | POA: Insufficient documentation

## 2024-11-20 DIAGNOSIS — F418 Other specified anxiety disorders: Secondary | ICD-10-CM | POA: Insufficient documentation

## 2024-11-20 LAB — CMP (CANCER CENTER ONLY)
ALT: 17 U/L (ref 0–44)
AST: 28 U/L (ref 15–41)
Albumin: 4.3 g/dL (ref 3.5–5.0)
Alkaline Phosphatase: 114 U/L (ref 38–126)
Anion gap: 12 (ref 5–15)
BUN: 23 mg/dL (ref 8–23)
CO2: 27 mmol/L (ref 22–32)
Calcium: 10 mg/dL (ref 8.9–10.3)
Chloride: 107 mmol/L (ref 98–111)
Creatinine: 1.37 mg/dL — ABNORMAL HIGH (ref 0.61–1.24)
GFR, Estimated: 51 mL/min — ABNORMAL LOW
Glucose, Bld: 158 mg/dL — ABNORMAL HIGH (ref 70–99)
Potassium: 3.7 mmol/L (ref 3.5–5.1)
Sodium: 145 mmol/L (ref 135–145)
Total Bilirubin: 0.6 mg/dL (ref 0.0–1.2)
Total Protein: 7.6 g/dL (ref 6.5–8.1)

## 2024-11-20 LAB — IRON AND IRON BINDING CAPACITY (CC-WL,HP ONLY)
Iron: 59 ug/dL (ref 45–182)
Saturation Ratios: 24 % (ref 17.9–39.5)
TIBC: 245 ug/dL — ABNORMAL LOW (ref 250–450)
UIBC: 186 ug/dL

## 2024-11-20 LAB — CBC WITH DIFFERENTIAL (CANCER CENTER ONLY)
Abs Immature Granulocytes: 0.01 K/uL (ref 0.00–0.07)
Basophils Absolute: 0 K/uL (ref 0.0–0.1)
Basophils Relative: 0 %
Eosinophils Absolute: 0 K/uL (ref 0.0–0.5)
Eosinophils Relative: 1 %
HCT: 35.8 % — ABNORMAL LOW (ref 39.0–52.0)
Hemoglobin: 12 g/dL — ABNORMAL LOW (ref 13.0–17.0)
Immature Granulocytes: 0 %
Lymphocytes Relative: 37 %
Lymphs Abs: 1.2 K/uL (ref 0.7–4.0)
MCH: 31 pg (ref 26.0–34.0)
MCHC: 33.5 g/dL (ref 30.0–36.0)
MCV: 92.5 fL (ref 80.0–100.0)
Monocytes Absolute: 0.2 K/uL (ref 0.1–1.0)
Monocytes Relative: 5 %
Neutro Abs: 1.8 K/uL (ref 1.7–7.7)
Neutrophils Relative %: 57 %
Platelet Count: 172 K/uL (ref 150–400)
RBC: 3.87 MIL/uL — ABNORMAL LOW (ref 4.22–5.81)
RDW: 13.2 % (ref 11.5–15.5)
WBC Count: 3.2 K/uL — ABNORMAL LOW (ref 4.0–10.5)
nRBC: 0 % (ref 0.0–0.2)

## 2024-11-20 LAB — FERRITIN: Ferritin: 399 ng/mL — ABNORMAL HIGH (ref 24–336)

## 2024-11-20 LAB — FOLATE: Folate: 15.5 ng/mL

## 2024-11-20 LAB — TSH: TSH: 0.195 u[IU]/mL — ABNORMAL LOW (ref 0.350–4.500)

## 2024-11-20 LAB — VITAMIN B12: Vitamin B-12: 1699 pg/mL — ABNORMAL HIGH (ref 180–914)

## 2024-11-20 LAB — LACTATE DEHYDROGENASE: LDH: 230 U/L (ref 105–235)

## 2024-11-20 NOTE — Progress Notes (Unsigned)
 "   [facility]  HEMATOLOGY CLINIC CONSULTATION NOTE   PATIENT NAME: Randy Merritt   MR#: [id number] DOB: 1941-11-03  DATE OF SERVICE: 11/20/2024   REFERRING PROVIDER  Elliot Charm, MD   Patient Care Team: Elliot Charm, MD as PCP - General (Internal Medicine)   REASON FOR CONSULTATION/ CHIEF COMPLAINT: ***   ASSESSMENT & PLAN:  Randy Merritt is a 84 y.o. gentleman with a past medical history of hypertension, dyslipidemia, generalized anxiety, history of CVA, history of multinodular goiter status post total thyroidectomy in 2024, postsurgical hypothyroidism, CKD stage III a, oropharyngeal dysphagia, was referred to our service for evaluation of mild pancytopenia.    No problem-specific Assessment & Plan notes found for this encounter.   Assessment and Plan Assessment & Plan    I reviewed lab results and outside records for this visit and discussed relevant results with the patient. Diagnosis, plan of care and treatment options were also discussed in detail with the patient. Opportunity provided to ask questions and answers provided to his apparent satisfaction. Provided instructions to call our clinic with any problems, questions or concerns prior to return visit. I recommended to continue follow-up with PCP and sub-specialists. He verbalized understanding and agreed with the plan. No barriers to learning was detected.  Chinita Patten, MD  11/20/2024 10:40 AM  [facility] - A DEPT OF [facility] [address] [place] [address] Dept: [phone] Dept Fax: [phone]   HISTORY OF PRESENT ILLNESS:   Discussed the use of AI scribe software for clinical note transcription with the patient, who gave verbal consent to proceed.  History of Present Illness   On 10/14/2024, labs at his PCPs office showed white count of 3200 with ANC of 1200, ALC of 1600, hemoglobin 12.8, MCV  93.2, platelet count 142,000.  He was referred to us  for further evaluation of mild pancytopenia.  Vitamin B12 was normal at 650.  TSH was 0.03, decreased.  Ferritin was normal at 137.  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  Lowest platelet count was 102,000 in November 2020.  Lowest white count was 2300 in July 2024 during a hospitalization.  He has had fluctuating anemia also with lowest hemoglobin of 8.9 in December 2020.  Recently labs on 10/25/2024 showed normal white count of 5000, normal platelet count of 158,000, hemoglobin 12.1.  He denies fever, cough, diarrhea, or other infectious symptoms.  He denies epistaxis, bloody stool, melena, hematuria, bruising or other bleeding symptoms. He also denies unintentional weight loss, night sweats or other constitutional symptoms.  MEDICAL HISTORY Past Medical History:  Diagnosis Date   Acid reflux    Anxiety    Arthritis    Depression     a little   Goiter    High cholesterol    History of blood transfusion    History of kidney stones    Memory loss    Mild per wife   Perforated bowel (HCC)    Seasonal allergies    Stroke [facility])    showed on Scan per wife no symptoms     SURGICAL HISTORY Past Surgical History:  Procedure Laterality Date   ANTERIOR CERVICAL DECOMP/DISCECTOMY FUSION N/A 04/08/2013   Procedure: ANTERIOR CERVICAL DECOMPRESSION/DISCECTOMY FUSION 1 LEVEL;  Surgeon: Catalina CHRISTELLA Stains, MD;  Location: [facility];  Service: Neurosurgery;  Laterality: N/A;  Cervical five-six Anterior cervical decompression/diskectomy fusion   APPENDECTOMY N/A 10/06/2019   Procedure: APPENDECTOMY;  Surgeon: Signe Mitzie LABOR, MD;  Location: [facility] OR;  Service: General;  Laterality: N/A;   BIOPSY  01/30/2019   Procedure: BIOPSY;  Surgeon: Rollin Dover, MD;  Location: [facility];  Service: Endoscopy;;   CERVICAL  DISCECTOMY     x2   CHOLECYSTECTOMY     COLON RESECTION SIGMOID N/A 10/06/2019   Procedure: COLON RESECTION SIGMOID;  Surgeon: Signe Mitzie LABOR, MD;  Location: [facility] OR;  Service: General;  Laterality: N/A;   ESOPHAGEAL DILATION  01/30/2019   Procedure: ESOPHAGEAL DILATION;  Surgeon: Rollin Dover, MD;  Location: [facility];  Service: Endoscopy;;  [place]   ESOPHAGOGASTRODUODENOSCOPY (EGD) WITH PROPOFOL  N/A 01/30/2019   Procedure: ESOPHAGOGASTRODUODENOSCOPY (EGD) WITH PROPOFOL ;  Surgeon: Rollin Dover, MD;  Location: [facility];  Service: Endoscopy;  Laterality: N/A;   gallstone removal     LAPAROTOMY N/A 10/06/2019   Procedure: EXPLORATORY LAPAROTOMY;  Surgeon: Signe Mitzie LABOR, MD;  Location: [facility] OR;  Service: General;  Laterality: N/A;   THYROIDECTOMY N/A 09/27/2023   Procedure: TOTAL THYROIDECTOMY;  Surgeon: Eletha Boas, MD;  Location: [facility] ORS;  Service: General;  Laterality: N/A;     SOCIAL HISTORY: He reports that he quit smoking about 66 years ago. His smoking use included cigarettes. He started smoking about 67 years ago. He has a 0.3 pack-year smoking history. He has never been exposed to tobacco smoke. He has never used smokeless tobacco. He reports that he does not drink alcohol and does not use drugs. Social History   Socioeconomic History   Marital status: Married    Spouse name: Not on file   Number of children: 3   Years of education: 6   Highest education level: Not on file  Occupational History   Not on file  Tobacco Use   Smoking status: Former    Current packs/day: 0.00    Average packs/day: 0.3 packs/day for 1 year (0.3 ttl pk-yrs)    Types: Cigarettes    Start date: 34    Quit date: 64    Years since quitting: 66.0    Passive exposure: Never   Smokeless tobacco: Never  Vaping Use   Vaping status: Never Used  Substance and Sexual Activity   Alcohol use: No   Drug use: No   Sexual activity: Not Currently  Other Topics Concern   Not on file  Social  History Narrative   Right handed   Married   Lives with wife   One floor home   retired   Social Drivers of Health   Tobacco Use: Medium Risk (10/25/2024)   Patient History    Smoking Tobacco Use: Former    Smokeless Tobacco Use: Never    Passive Exposure: Never  Programmer, Applications: Not on file  Food Insecurity: No Food Insecurity (09/27/2023)   Hunger Vital Sign    Worried About Running Out of Food in the Last Year: Never true    Ran Out of Food in the Last Year: Never true  Transportation Needs: No Transportation Needs (09/27/2023)   PRAPARE - Administrator, Civil Service (Medical): No    Lack of Transportation (Non-Medical): No  Physical Activity: Not on file  Stress: Not on file  Social Connections: Not on file  Intimate Partner Violence: Not At Risk (09/27/2023)   Humiliation, Afraid, Rape, and Kick questionnaire    Fear of Current  or Ex-Partner: No    Emotionally Abused: No    Physically Abused: No    Sexually Abused: No  Depression (PHQ2-9): Low Risk (11/20/2024)   Depression (PHQ2-9)    PHQ-2 Score: 0  Alcohol Screen: Not on file  Housing: Low Risk (09/27/2023)   Housing    Last Housing Risk Score: 0  Utilities: Not At Risk (09/27/2023)   AHC Utilities    Threatened with loss of utilities: No  Health Literacy: Not on file    FAMILY HISTORY: His family history includes Diabetes in his sister; Healthy in his father and mother; Hypertension in his brother, sister, and sister.  CURRENT MEDICATIONS   Current Outpatient Medications  Medication Instructions   acetaminophen  (TYLENOL ) 1,000 mg, Oral, Every 8 hours   ALPRAZolam  (XANAX ) 0.25 mg, 2 times daily   Baclofen  2.5 mg, Oral, 3 times daily with meals   calcium  carbonate (TUMS) 500 MG chewable tablet 400 mg of elemental calcium , Oral, 3 times daily   citalopram  (CELEXA ) 10 mg, Daily   docusate sodium  (COLACE) 100 mg, Oral, 2 times daily   donepezil  (ARICEPT ) 10 MG tablet Take half tablet  (5 mg) daily for 2 weeks, then increase to the full tablet at 10 mg daily   famotidine  (PEPCID ) 20 mg, Oral, 2 times daily   feeding supplement (ENSURE ENLIVE / ENSURE PLUS) LIQD 237 mLs, Oral, 3 times daily between meals   finasteride  (PROSCAR ) 5 mg, Daily   fluticasone  (FLONASE ) 50 MCG/ACT nasal spray 2 sprays, Daily PRN   lansoprazole  (PREVACID ) 15 mg, Oral, Daily   levothyroxine  (SYNTHROID ) 100 mcg, Oral, Daily before breakfast   lidocaine  (XYLOCAINE ) 2 % solution 15 mLs, Mouth/Throat, As needed   morphine  (MSIR) 7.5 mg, Oral, Every 4 hours PRN   ondansetron  (ZOFRAN ) 4 mg, Oral, Every 6 hours PRN   ondansetron  (ZOFRAN -ODT) 4 mg, Oral, Every 8 hours PRN   QUEtiapine  (SEROQUEL ) 25 mg, Oral, Daily at bedtime   rosuvastatin  (CRESTOR ) 20 mg, Daily   timolol  (TIMOPTIC ) 0.5 % ophthalmic solution 1 drop, Daily     ALLERGIES  He is allergic to alprazolam , bupropion , bupropion  hcl er (xl), cyclobenzaprine, escitalopram, mirtazapine, prednisolone, and prednisone .  REVIEW OF SYSTEMS:  Review of Systems - Oncology   Rest of the pertinent review of systems is unremarkable except as mentioned above in HPI.  PHYSICAL EXAMINATION:  ***   Onc Performance Status - 11/20/24 1005       ECOG Perf Status   ECOG Perf Status Restricted in physically strenuous activity but ambulatory and able to carry out work of a light or sedentary nature, e.g., light house work, office work      KPS SCALE   KPS % SCORE Able to carry on normal activity, minor s/s of disease          Vitals:   11/20/24 1004  BP: 109/67  Pulse: 97  Resp: 17  Temp: (!) 97.3 F (36.3 C)  SpO2: 98%   Filed Weights   11/20/24 1004  Weight: 118 lb 1.6 oz (53.6 kg)    Physical Exam Constitutional:      General: He is not in acute distress.    Appearance: Normal appearance.  HENT:     Head: Normocephalic and atraumatic.  Eyes:     Conjunctiva/sclera: Conjunctivae normal.  Cardiovascular:     Rate and Rhythm:  Normal rate and regular rhythm.  Pulmonary:     Effort: Pulmonary effort is normal. No respiratory distress.  Abdominal:  General: There is no distension.  Neurological:     Mental Status: He is alert.  Psychiatric:     Comments: Not speaking for the most part during the interview and deferred to his wife.      LABORATORY DATA:   I have reviewed the data as listed.  No results found for any visits on 11/20/24.   RADIOGRAPHIC STUDIES:  I have personally reviewed the radiological images as listed and agreed with the findings in the report.  DG Chest 2 View EXAM: 2 VIEW(S) XRAY OF THE CHEST 10/24/2024 02:05:53 PM  COMPARISON: CXR dated 07/05/2023 and CT chest dated 03/30/2022.  CLINICAL HISTORY: shortness of breath, hx GERD \\T \ swallowing issues  FINDINGS:  LUNGS AND PLEURA: No focal pulmonary opacity. No pleural effusion. No pneumothorax.  HEART AND MEDIASTINUM: No acute abnormality of the cardiac and mediastinal silhouettes.  BONES AND SOFT TISSUES: Cholecystectomy clips noted. Thoracic degenerative changes. No acute osseous abnormality.  IMPRESSION: 1. No acute cardiopulmonary findings.  Electronically signed by: Morgane Naveau MD 10/24/2024 02:31 PM EST RP Workstation: [id number]    Orders Placed This Encounter  Procedures   CBC with Differential (Cancer Center Only)    Standing Status:   Future    Expiration Date:   11/20/2025   CMP (Cancer Center only)    Standing Status:   Future    Expiration Date:   11/20/2025   Lactate dehydrogenase    Standing Status:   Future    Expiration Date:   11/20/2025   Methylmalonic acid, serum    Standing Status:   Future    Expiration Date:   11/20/2025   TSH    Standing Status:   Future    Expiration Date:   11/20/2025   Iron  and Iron  Binding Capacity ([facility] only)    Standing Status:   Future    Expiration Date:   11/20/2025   Ferritin    Standing Status:   Future    Expiration Date:   11/20/2025   Vitamin B12     Standing Status:   Future    Expiration Date:   11/20/2025   Folate    Standing Status:   Future    Expiration Date:   11/20/2025   ANA w/Reflex if Positive    Standing Status:   Future    Expiration Date:   11/20/2025   Multiple Myeloma Panel (SPEP&IFE w/QIG)    Standing Status:   Future    Expiration Date:   11/20/2025   Kappa/lambda light chains    Standing Status:   Future    Expiration Date:   11/20/2025    Future Appointments  Date Time Provider Department Center  04/19/2025  9:30 AM Dina Camie BRAVO, PA-C [facility] None    I spent a total of 55 minutes during this encounter with the patient including review of chart and various tests results, discussions about plan of care and coordination of care plan.  This document was completed utilizing speech recognition software. Grammatical errors, random word insertions, pronoun errors, and incomplete sentences are an occasional consequence of this system due to software limitations, ambient noise, and hardware issues. Any formal questions or concerns about the content, text or information contained within the body of this dictation should be directly addressed to the provider for clarification.  "

## 2024-11-21 LAB — ANA W/REFLEX IF POSITIVE: Anti Nuclear Antibody (ANA): NEGATIVE

## 2024-11-22 LAB — METHYLMALONIC ACID, SERUM: Methylmalonic Acid, Quantitative: 167 nmol/L (ref 0–378)

## 2024-11-23 ENCOUNTER — Other Ambulatory Visit: Payer: Self-pay

## 2024-11-23 ENCOUNTER — Encounter: Payer: Self-pay | Admitting: Oncology

## 2024-11-23 DIAGNOSIS — D649 Anemia, unspecified: Secondary | ICD-10-CM | POA: Insufficient documentation

## 2024-11-23 LAB — KAPPA/LAMBDA LIGHT CHAINS
Kappa free light chain: 24.9 mg/L — ABNORMAL HIGH (ref 3.3–19.4)
Kappa, lambda light chain ratio: 1.32 (ref 0.26–1.65)
Lambda free light chains: 18.9 mg/L (ref 5.7–26.3)

## 2024-11-23 MED ORDER — LIDOCAINE VISCOUS HCL 2 % MT SOLN
OROMUCOSAL | 1 refills | Status: AC
  Filled 2024-11-23: qty 120, 4d supply, fill #0

## 2024-11-23 NOTE — Assessment & Plan Note (Signed)
 On 10/14/2024, labs at his PCPs office showed white count of 3200 with ANC of 1200, ALC of 1600, hemoglobin 12.8, MCV 93.2, platelet count 142,000.  He was referred to us  for further evaluation of mild pancytopenia.  Vitamin B12 was normal at 650.  TSH was 0.03, decreased.  Ferritin was normal at 137.  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  Lowest platelet count was 102,000 in November 2020.  Lowest white count was 2300 in July 2024 during a hospitalization.  He has had fluctuating anemia also with lowest hemoglobin of 8.9 in December 2020.  Recently labs on 10/25/2024 showed normal white count of 5000, normal platelet count of 158,000, hemoglobin 12.1.  He has long-standing, mild cytopenias with fluctuating values over 14-15 years, currently only slightly below normal with recent normalization of counts. There is no evidence of acute or serious bone marrow pathology.  Differential includes nutritional deficiencies, chronic disease, and possible effects of thyroid  replacement therapy. Marrow-infiltrative processes such as myeloma are unlikely.   Fatigue and weakness significantly limit his functional status, though these symptoms are not clearly attributable to cytopenias.  Labs today showed hemoglobin of 12, MCV 92.5, white count 3200 with normal differential, ANC 1800, normal platelet count of 172,000.  CMP showed creatinine of 1.37, otherwise unremarkable.  LDH within normal limits.  Iron  studies show no evidence of iron  deficiency.  B12, folate are normal.  Given mild renal insufficiency in the context of anemia, we will proceed with workup for monoclonal gammopathy.  ANA pending.  - Planned to review laboratory results and provide follow-up via phone in two weeks. - Scheduled tentative follow-up appointment in four months, with earlier follow-up if indicated by  laboratory results. - Planned to communicate findings and recommendations to his primary care provider.

## 2024-11-25 LAB — MULTIPLE MYELOMA PANEL, SERUM
Albumin SerPl Elph-Mcnc: 3.8 g/dL (ref 2.9–4.4)
Albumin/Glob SerPl: 1.3 (ref 0.7–1.7)
Alpha 1: 0.3 g/dL (ref 0.0–0.4)
Alpha2 Glob SerPl Elph-Mcnc: 0.7 g/dL (ref 0.4–1.0)
B-Globulin SerPl Elph-Mcnc: 1.3 g/dL (ref 0.7–1.3)
Gamma Glob SerPl Elph-Mcnc: 0.7 g/dL (ref 0.4–1.8)
Globulin, Total: 3 g/dL (ref 2.2–3.9)
IgA: 359 mg/dL (ref 61–437)
IgG (Immunoglobin G), Serum: 878 mg/dL (ref 603–1613)
IgM (Immunoglobulin M), Srm: 50 mg/dL (ref 15–143)
Total Protein ELP: 6.8 g/dL (ref 6.0–8.5)

## 2024-12-04 ENCOUNTER — Inpatient Hospital Stay: Admitting: Oncology

## 2024-12-04 DIAGNOSIS — D649 Anemia, unspecified: Secondary | ICD-10-CM

## 2024-12-04 NOTE — Progress Notes (Signed)
 Phone call visit could not be completed despite multiple attempts at both the numbers listed.

## 2024-12-04 NOTE — Progress Notes (Deleted)
 "  [facility]  HEMATOLOGY-ONCOLOGY ELECTRONIC VISIT PROGRESS NOTE  PATIENT NAME: Randy Merritt   MR#: [id number] DOB: 03/02/1941  DATE OF SERVICE: 12/04/2024  Patient Care Team: Elliot Charm, MD as PCP - General (Internal Medicine)  I connected with the patient via telephone conference and verified that I am speaking with the correct person using two identifiers. The patient's location is at home and I am providing care from the [facility].  I discussed the limitations, risks, security and privacy concerns of performing an evaluation and management service by e-visits and the availability of in person appointments. I also discussed with the patient that there may be a patient responsible charge related to this service. The patient expressed understanding and agreed to proceed.   ASSESSMENT & PLAN:   Randy Merritt is a 84 y.o. gentleman with a past medical history of hypertension, dyslipidemia, generalized anxiety, history of CVA, history of multinodular goiter status post total thyroidectomy in 2024, postsurgical hypothyroidism, CKD stage III a, oropharyngeal dysphagia, was referred to our service for evaluation of mild pancytopenia.    No problem-specific Assessment & Plan notes found for this encounter.   Assessment and Plan Assessment & Plan       I discussed the assessment and treatment plan with the patient. The patient was provided an opportunity to ask questions and all were answered. The patient agreed with the plan and demonstrated an understanding of the instructions. The patient was advised to call back or seek an in-person evaluation if the symptoms worsen or if the condition fails to improve as anticipated.    I spent *** minutes over the phone with the patient reviewing test results, discuss management and coordination/planning of care.  Chinita Patten, MD 12/04/2024 2:22 PM [facility] - A DEPT OF  [facility] [address] [place] [address] Dept: [phone] Dept Fax: [phone]   INTERVAL HISTORY:  Please see above for problem oriented charting.  The purpose of today's discussion is to explain recent lab results and to formulate plan of care.  Discussed the use of AI scribe software for clinical note transcription with the patient, who gave verbal consent to proceed.  History of Present Illness      ***  SUMMARY OF HEMATOLOGY HISTORY:  84 y.o. gentleman with chronic cytopenias was referred to our clinic in January 2026 for evaluation of longstanding low blood counts.   On 10/14/2024, labs at his PCPs office showed white count of 3200 with ANC of 1200, ALC of 1600, hemoglobin 12.8, MCV 93.2, platelet count 142,000.  He was referred to us  for further evaluation of mild pancytopenia.  Vitamin B12 was normal at 650.  TSH was 0.03, decreased.  Ferritin was normal at 137.   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  Lowest platelet count was 102,000 in November 2020.  Lowest white count was 2300 in July 2024 during a hospitalization.  He has had fluctuating anemia also with lowest hemoglobin of 8.9 in December 2020.  Recently labs on 10/25/2024 showed normal white count of 5000, normal platelet count of 158,000, hemoglobin 12.1.   Repeat labs on October 25, 2024, showed WBC 5,000/L and platelets 158,000/L. He denies abnormal bleeding or bruising.   He underwent thyroidectomy in November 2024 and subsequently developed persistent dysphagia, particularly on  the left side, with limitation to soft foods, poor appetite, and reduced oral intake. He has frequent phlegm production requiring coughing to clear secretions and has not improved with speech therapy. He has not required enteral feeding.   He has experienced  progressive weakness, spending most of his time in bed over the past week, and is only able to sit up briefly. He uses a wheelchair for mobility and reports significant fatigue and exertional dyspnea, with episodes of sudden shortness of breath and chest tightness without chest pain, sometimes requiring him to lie down.  Differential includes nutritional deficiencies, chronic disease, and possible effects of thyroid  replacement therapy. Marrow-infiltrative processes such as myeloma are unlikely.    Fatigue and weakness significantly limit his functional status, though these symptoms are not clearly attributable to cytopenias.   Labs today showed hemoglobin of 12, MCV 92.5, white count 3200 with normal differential, ANC 1800, normal platelet count of 172,000.  CMP showed creatinine of 1.37, otherwise unremarkable.  LDH within normal limits.  Iron  studies show no evidence of iron  deficiency.  B12, folate are normal.   Given mild renal insufficiency in the context of anemia, we will proceed with workup for monoclonal gammopathy.  ANA pending.   - Planned to review laboratory results and provide follow-up via phone in two weeks. - Scheduled tentative follow-up appointment in four months, with earlier follow-up if indicated by laboratory results. - Planned to communicate findings and recommendations to his primary care provider.  REVIEW OF SYSTEMS:    Review of Systems - Oncology  All other pertinent systems were reviewed with the patient and are negative.  I have reviewed the past medical history, past surgical history, social history and family history with the patient and they are unchanged from previous note.  ALLERGIES:  He is allergic to alprazolam , bupropion , bupropion  hcl er (xl), cyclobenzaprine, escitalopram, mirtazapine, prednisolone, and prednisone .  MEDICATIONS:  Current Outpatient Medications  Medication Sig Dispense Refill   acetaminophen  (TYLENOL ) 500 MG tablet Take 2 tablets  (1,000 mg total) by mouth every 8 (eight) hours. 30 tablet 0   ALPRAZolam  (XANAX ) 0.25 MG tablet Take 0.25 mg by mouth in the morning and at bedtime. (Patient not taking: Reported on 11/20/2024)     Baclofen  5 MG TABS Take 0.5 tablets (2.5 mg total) by mouth with breakfast, with lunch, and with evening meal. 45 tablet 0   calcium  carbonate (TUMS) 500 MG chewable tablet Chew 2 tablets (400 mg of elemental calcium  total) by mouth 3 (three) times daily. 120 tablet 1   citalopram  (CELEXA ) 10 MG tablet Take 10 mg by mouth daily.     docusate sodium  (COLACE) 100 MG capsule Take 1 capsule (100 mg total) by mouth 2 (two) times daily. 10 capsule 0   donepezil  (ARICEPT ) 10 MG tablet Take half tablet (5 mg) daily for 2 weeks, then increase to the full tablet at 10 mg daily 90 tablet 3   famotidine  (PEPCID ) 20 MG tablet Take 1 tablet (20 mg total) by mouth 2 (two) times daily. (Patient not taking: Reported on 11/20/2024) 30 tablet 1   feeding supplement (ENSURE ENLIVE / ENSURE PLUS) LIQD Take 237 mLs by mouth 3 (three) times daily between meals. 10000 mL 0   finasteride  (PROSCAR ) 5 MG tablet Take 5 mg by mouth daily.     fluticasone  (FLONASE ) 50 MCG/ACT nasal spray Place 2 sprays into both nostrils daily as needed for allergies.     lansoprazole  (PREVACID ) 15 MG capsule Take 1 capsule (  15 mg total) by mouth daily at 12 noon. 30 capsule 0   levothyroxine  (SYNTHROID ) 100 MCG tablet Take 1 tablet (100 mcg total) by mouth daily before breakfast. (Patient not taking: Reported on 11/20/2024) 30 tablet 2   lidocaine  (XYLOCAINE ) 2 % solution Use as directed 15 mLs in the mouth or throat as needed for mouth pain. 150 mL 0   magic mouthwash (lidocaine , diphenhydrAMINE, alum & mag hydroxide) suspension GARGLE & SPIT BY MOUTH EVERY 4 HOURS FOR 5 DAYS 120 mL 1   morphine  (MSIR) 15 MG tablet Take 0.5 tablets (7.5 mg total) by mouth every 4 (four) hours as needed for severe pain (pain score 7-10). 10 tablet 0   ondansetron   (ZOFRAN ) 4 MG tablet Take 1 tablet (4 mg total) by mouth every 6 (six) hours as needed for nausea. 20 tablet 0   ondansetron  (ZOFRAN -ODT) 4 MG disintegrating tablet Take 1 tablet (4 mg total) by mouth every 8 (eight) hours as needed for up to 12 doses for nausea or vomiting. 12 tablet 0   QUEtiapine  (SEROQUEL ) 25 MG tablet Take 1 tablet (25 mg total) by mouth at bedtime. 30 tablet 0   rosuvastatin  (CRESTOR ) 20 MG tablet Take 20 mg by mouth daily.     timolol  (TIMOPTIC ) 0.5 % ophthalmic solution Place 1 drop into both eyes daily.     No current facility-administered medications for this visit.    PHYSICAL EXAMINATION:  Not performed today as it was a phone only visit  LABORATORY DATA:   I have reviewed the data as listed.  Recent Results (from the past 2160 hours)  Vitamin B12     Status: None   Collection Time: 10/19/24  8:57 AM  Result Value Ref Range   Vitamin B-12 701 200 - 1,100 pg/mL  Lipase, blood     Status: None   Collection Time: 10/25/24  3:06 PM  Result Value Ref Range   Lipase 25 11 - 51 U/L    Comment: Performed at [facility], [address] [place], [place] [address]  Comprehensive metabolic panel     Status: Abnormal   Collection Time: 10/25/24  3:06 PM  Result Value Ref Range   Sodium 142 135 - 145 mmol/L   Potassium 3.9 3.5 - 5.1 mmol/L   Chloride 107 98 - 111 mmol/L   CO2 25 22 - 32 mmol/L   Glucose, Bld 110 (H) 70 - 99 mg/dL    Comment: Glucose reference range applies only to samples taken after fasting for at least 8 hours.   BUN 22 8 - 23 mg/dL   Creatinine, Ser 8.64 (H) 0.61 - 1.24 mg/dL   Calcium  9.5 8.9 - 10.3 mg/dL   Total Protein 7.1 6.5 - 8.1 g/dL   Albumin 4.1 3.5 - 5.0 g/dL   AST 25 15 - 41 U/L   ALT 22 0 - 44 U/L   Alkaline Phosphatase 96 38 - 126 U/L   Total Bilirubin 0.9 0.0 - 1.2 mg/dL   GFR, Estimated 52 (L) >60 mL/min    Comment: (NOTE) Calculated using the CKD-EPI Creatinine Equation (2021)    Anion gap 10 5 - 15     Comment: Performed at [facility], [address] [place], [place] [address]  CBC     Status: Abnormal   Collection Time: 10/25/24  3:06 PM  Result Value Ref Range   WBC 5.0 4.0 - 10.5 K/uL   RBC 3.79 (L) 4.22 -  5.81 MIL/uL   Hemoglobin 12.1 (L) 13.0 - 17.0 g/dL   HCT 63.9 (L) 60.9 - 47.9 %   MCV 95.0 80.0 - 100.0 fL   MCH 31.9 26.0 - 34.0 pg   MCHC 33.6 30.0 - 36.0 g/dL   RDW 87.2 88.4 - 84.4 %   Platelets 158 150 - 400 K/uL   nRBC 0.0 0.0 - 0.2 %    Comment: Performed at [facility], [address], [place], [place] [address]  CBC with Differential (Cancer Center Only)     Status: Abnormal   Collection Time: 11/20/24 10:59 AM  Result Value Ref Range   WBC Count 3.2 (L) 4.0 - 10.5 K/uL   RBC 3.87 (L) 4.22 - 5.81 MIL/uL   Hemoglobin 12.0 (L) 13.0 - 17.0 g/dL   HCT 64.1 (L) 60.9 - 47.9 %   MCV 92.5 80.0 - 100.0 fL   MCH 31.0 26.0 - 34.0 pg   MCHC 33.5 30.0 - 36.0 g/dL   RDW 86.7 88.4 - 84.4 %   Platelet Count 172 150 - 400 K/uL   nRBC 0.0 0.0 - 0.2 %   Neutrophils Relative % 57 %   Neutro Abs 1.8 1.7 - 7.7 K/uL   Lymphocytes Relative 37 %   Lymphs Abs 1.2 0.7 - 4.0 K/uL   Monocytes Relative 5 %   Monocytes Absolute 0.2 0.1 - 1.0 K/uL   Eosinophils Relative 1 %   Eosinophils Absolute 0.0 0.0 - 0.5 K/uL   Basophils Relative 0 %   Basophils Absolute 0.0 0.0 - 0.1 K/uL   Immature Granulocytes 0 %   Abs Immature Granulocytes 0.01 0.00 - 0.07 K/uL    Comment: Performed at [facility], [address], [place], [place] [address]  CMP (Cancer Center only)     Status: Abnormal   Collection Time: 11/20/24 10:59 AM  Result Value Ref Range   Sodium 145 135 - 145 mmol/L   Potassium 3.7 3.5 - 5.1 mmol/L   Chloride 107 98 - 111 mmol/L   CO2 27 22 - 32 mmol/L   Glucose, Bld 158 (H) 70 - 99 mg/dL    Comment: Glucose reference range applies only to samples taken after fasting for at least 8 hours.   BUN 23 8 - 23 mg/dL   Creatinine 8.62 (H) 9.38  - 1.24 mg/dL   Calcium  10.0 8.9 - 10.3 mg/dL   Total Protein 7.6 6.5 - 8.1 g/dL   Albumin 4.3 3.5 - 5.0 g/dL   AST 28 15 - 41 U/L   ALT 17 0 - 44 U/L   Alkaline Phosphatase 114 38 - 126 U/L   Total Bilirubin 0.6 0.0 - 1.2 mg/dL   GFR, Estimated 51 (L) >60 mL/min    Comment: (NOTE) Calculated using the CKD-EPI Creatinine Equation (2021)    Anion gap 12 5 - 15    Comment: Performed at [facility], [address], [place], [place] [address]  Lactate dehydrogenase     Status: None   Collection Time: 11/20/24 10:59 AM  Result Value Ref Range   LDH 230 105 - 235 U/L    Comment: Performed at [facility], [address] [place], [place] [address]  Methylmalonic acid, serum     Status: None   Collection Time: 11/20/24 10:59 AM  Result Value Ref Range   Methylmalonic Acid, Quantitative 167 0 - 378 nmol/L    Comment: (NOTE) This test  was developed and its performance characteristics determined by [facility]. It has not been cleared or approved by the Food and Drug Administration. Performed At: [facility] [address] [place], [place] [id number] Jennette Shorter MD Ey:[id number]   Iron  and Iron  Binding Capacity ([facility] only)     Status: Abnormal   Collection Time: 11/20/24 10:59 AM  Result Value Ref Range   Iron  59 45 - 182 ug/dL   TIBC 754 (L) 749 - 549 ug/dL   Saturation Ratios 24 17.9 - 39.5 %   UIBC 186 ug/dL    Comment: Performed at [facility], [address], [place], [place] [address]  Vitamin B12     Status: Abnormal   Collection Time: 11/20/24 10:59 AM  Result Value Ref Range   Vitamin B-12 1,699 (H) 180 - 914 pg/mL    Comment: Performed at [facility], [address], [place], [place] [address]  ANA w/Reflex if Positive     Status: None   Collection Time: 11/20/24 10:59 AM  Result Value Ref Range   Anti Nuclear Antibody (ANA) Negative Negative    Comment:  (NOTE) Performed At: [facility] [address] [place], [place] [id number] Jennette Shorter MD Ey:[id number]   Multiple Myeloma Panel (SPEP&IFE w/QIG)     Status: None   Collection Time: 11/20/24 10:59 AM  Result Value Ref Range   IgG (Immunoglobin G), Serum 878 603 - 1,613 mg/dL   IgA 640 61 - 562 mg/dL   IgM (Immunoglobulin M), Srm 50 15 - 143 mg/dL   Total Protein ELP 6.8 6.0 - 8.5 g/dL   Albumin SerPl Elph-Mcnc 3.8 2.9 - 4.4 g/dL   Alpha 1 0.3 0.0 - 0.4 g/dL   Alpha2 Glob SerPl Elph-Mcnc 0.7 0.4 - 1.0 g/dL   B-Globulin SerPl Elph-Mcnc 1.3 0.7 - 1.3 g/dL   Gamma Glob SerPl Elph-Mcnc 0.7 0.4 - 1.8 g/dL   M Protein SerPl Elph-Mcnc Not Observed Not Observed g/dL   Globulin, Total 3.0 2.2 - 3.9 g/dL   Albumin/Glob SerPl 1.3 0.7 - 1.7   IFE 1 Comment     Comment: (NOTE) The immunofixation pattern appears unremarkable. Evidence of monoclonal protein is not apparent.    Please Note Comment     Comment: (NOTE) Protein electrophoresis scan will follow via computer, mail, or courier delivery. Performed At: [facility] [address] [place], [place] [id number] Jennette Shorter MD Ey:[id number]   Kappa/lambda light chains     Status: Abnormal   Collection Time: 11/20/24 10:59 AM  Result Value Ref Range   Kappa free light chain 24.9 (H) 3.3 - 19.4 mg/L   Lambda free light chains 18.9 5.7 - 26.3 mg/L   Kappa, lambda light chain ratio 1.32 0.26 - 1.65    Comment: (NOTE) Performed At: [facility] [address] [place], [place] [id number] Jennette Shorter MD Ey:[id number]   TSH     Status: Abnormal   Collection Time: 11/20/24 11:00 AM  Result Value Ref Range   TSH 0.195 (L) 0.350 - 4.500 uIU/mL    Comment: Performed at [facility], [address] [place], [place] [address]  Ferritin     Status: Abnormal   Collection Time: 11/20/24 11:01 AM  Result Value Ref Range   Ferritin 399 (H) 24 - 336 ng/mL    Comment: Performed at [facility], [address], [place], [place] [address]  Folate     Status: None   Collection  Time: 11/20/24 11:01 AM  Result Value Ref Range   Folate 15.5 >5.9 ng/mL    Comment: Performed at [facility], [address], [place], [place] [address]     RADIOGRAPHIC STUDIES:  I have personally reviewed the radiological images as listed and agree with the findings in the report.  No results found.  *** No recent pertinent imaging studies available to review.  No orders of the defined types were placed in this encounter.    Future Appointments  Date Time Provider Department Center  12/04/2024  3:45 PM Rodarius Kichline, Chinita, MD [facility] None  03/24/2025  9:15 AM [facility] LAB [facility] None  03/24/2025  9:45 AM Heath Tesler, Chinita, MD [facility] None  04/19/2025  9:30 AM Dina Camie BRAVO, PA-C [facility] None    This document was completed utilizing speech recognition software. Grammatical errors, random word insertions, pronoun errors, and incomplete sentences are an occasional consequence of this system due to software limitations, ambient noise, and hardware issues. Any formal questions or concerns about the content, text or information contained within the body of this dictation should be directly addressed to the provider for clarification.  "

## 2025-03-24 ENCOUNTER — Inpatient Hospital Stay

## 2025-03-24 ENCOUNTER — Inpatient Hospital Stay: Admitting: Oncology

## 2025-04-19 ENCOUNTER — Ambulatory Visit: Admitting: Physician Assistant
